# Patient Record
Sex: Female | Born: 1945
Health system: Southern US, Community
[De-identification: ages and names within clinical notes are randomized; demographics above are authoritative.]

## PROBLEM LIST (undated history)

## (undated) DIAGNOSIS — E559 Vitamin D deficiency, unspecified: Secondary | ICD-10-CM

## (undated) DIAGNOSIS — Z923 Personal history of irradiation: Secondary | ICD-10-CM

## (undated) DIAGNOSIS — I10 Essential (primary) hypertension: Secondary | ICD-10-CM

## (undated) DIAGNOSIS — Z801 Family history of malignant neoplasm of trachea, bronchus and lung: Secondary | ICD-10-CM

## (undated) DIAGNOSIS — IMO0001 Reserved for inherently not codable concepts without codable children: Secondary | ICD-10-CM

## (undated) DIAGNOSIS — Z803 Family history of malignant neoplasm of breast: Secondary | ICD-10-CM

## (undated) DIAGNOSIS — C7951 Secondary malignant neoplasm of bone: Secondary | ICD-10-CM

## (undated) DIAGNOSIS — E785 Hyperlipidemia, unspecified: Secondary | ICD-10-CM

## (undated) DIAGNOSIS — IMO0002 Reserved for concepts with insufficient information to code with codable children: Secondary | ICD-10-CM

## (undated) DIAGNOSIS — C3490 Malignant neoplasm of unspecified part of unspecified bronchus or lung: Secondary | ICD-10-CM

## (undated) DIAGNOSIS — T7840XA Allergy, unspecified, initial encounter: Secondary | ICD-10-CM

## (undated) DIAGNOSIS — F419 Anxiety disorder, unspecified: Secondary | ICD-10-CM

## (undated) DIAGNOSIS — K644 Residual hemorrhoidal skin tags: Secondary | ICD-10-CM

## (undated) DIAGNOSIS — Z9221 Personal history of antineoplastic chemotherapy: Secondary | ICD-10-CM

## (undated) HISTORY — PX: APPENDECTOMY: SHX54

## (undated) HISTORY — DX: Malignant neoplasm of unspecified part of unspecified bronchus or lung: C34.90

## (undated) HISTORY — DX: Essential (primary) hypertension: I10

## (undated) HISTORY — DX: Secondary malignant neoplasm of bone: C79.51

## (undated) HISTORY — DX: Family history of malignant neoplasm of breast: Z80.3

## (undated) HISTORY — DX: Family history of malignant neoplasm of trachea, bronchus and lung: Z80.1

## (undated) HISTORY — DX: Reserved for inherently not codable concepts without codable children: IMO0001

## (undated) HISTORY — PX: OVARIAN CYST REMOVAL: SHX89

## (undated) HISTORY — DX: Hyperlipidemia, unspecified: E78.5

## (undated) HISTORY — DX: Personal history of irradiation: Z92.21

## (undated) HISTORY — DX: Personal history of irradiation: Z92.3

## (undated) HISTORY — DX: Reserved for concepts with insufficient information to code with codable children: IMO0002

## (undated) HISTORY — DX: Vitamin D deficiency, unspecified: E55.9

## (undated) HISTORY — DX: Residual hemorrhoidal skin tags: K64.4

---

## 2002-04-15 ENCOUNTER — Ambulatory Visit (HOSPITAL_COMMUNITY): Admission: RE | Admit: 2002-04-15 | Discharge: 2002-04-15 | Payer: Self-pay | Admitting: Gastroenterology

## 2012-06-12 ENCOUNTER — Other Ambulatory Visit (HOSPITAL_COMMUNITY): Payer: Self-pay | Admitting: Internal Medicine

## 2012-06-12 DIAGNOSIS — R918 Other nonspecific abnormal finding of lung field: Secondary | ICD-10-CM

## 2012-06-13 ENCOUNTER — Encounter (HOSPITAL_COMMUNITY): Payer: Self-pay

## 2012-06-13 ENCOUNTER — Encounter (HOSPITAL_COMMUNITY)
Admission: RE | Admit: 2012-06-13 | Discharge: 2012-06-13 | Disposition: A | Discharge status: Home / Self Care | Payer: 59 | Source: Ambulatory Visit | Attending: Internal Medicine | Admitting: Internal Medicine

## 2012-06-13 DIAGNOSIS — C7951 Secondary malignant neoplasm of bone: Secondary | ICD-10-CM | POA: Insufficient documentation

## 2012-06-13 DIAGNOSIS — C781 Secondary malignant neoplasm of mediastinum: Secondary | ICD-10-CM | POA: Insufficient documentation

## 2012-06-13 DIAGNOSIS — R918 Other nonspecific abnormal finding of lung field: Secondary | ICD-10-CM

## 2012-06-13 DIAGNOSIS — R222 Localized swelling, mass and lump, trunk: Secondary | ICD-10-CM | POA: Insufficient documentation

## 2012-06-13 DIAGNOSIS — C50919 Malignant neoplasm of unspecified site of unspecified female breast: Secondary | ICD-10-CM | POA: Insufficient documentation

## 2012-06-13 LAB — GLUCOSE, CAPILLARY: Glucose-Capillary: 94 mg/dL (ref 70–99)

## 2012-06-13 MED ORDER — FLUDEOXYGLUCOSE F - 18 (FDG) INJECTION
19.1000 | Freq: Once | INTRAVENOUS | Status: AC | PRN
  Administered 2012-06-13: 19.1 via INTRAVENOUS

## 2012-06-14 ENCOUNTER — Other Ambulatory Visit: Payer: Self-pay | Admitting: Thoracic Surgery (Cardiothoracic Vascular Surgery)

## 2012-06-14 ENCOUNTER — Institutional Professional Consult (permissible substitution) (INDEPENDENT_AMBULATORY_CARE_PROVIDER_SITE_OTHER): Payer: 59 | Admitting: Thoracic Surgery (Cardiothoracic Vascular Surgery)

## 2012-06-14 ENCOUNTER — Encounter: Payer: Self-pay | Admitting: Thoracic Surgery (Cardiothoracic Vascular Surgery)

## 2012-06-14 VITALS — BP 144/63 | HR 70 | Resp 16 | Ht 62.0 in | Wt 182.0 lb

## 2012-06-14 DIAGNOSIS — R222 Localized swelling, mass and lump, trunk: Secondary | ICD-10-CM

## 2012-06-14 DIAGNOSIS — D381 Neoplasm of uncertain behavior of trachea, bronchus and lung: Secondary | ICD-10-CM

## 2012-06-14 NOTE — Progress Notes (Signed)
PCP is [name], MD Referring Provider is Cloward, Laban Emperor, MD  Chief Complaint  Patient presents with  . Lung Mass    suprclavicular mass per [facility] CT CHEST/PET    HPI: 66 year old Hispanic woman presents with a chief complaint of a questionable left neck mass.  Jacqueline Leonard is a 66 year old woman, nonsmoker, who about a year ago first noted she thought to be a fullness or mass in her left neck and shoulder region. She said this would come and go. It was nontender. She recently saw Dr. Andi Devon and mention this to him. He did a CT of the chest which showed a left lower lobe mass and osteolytic left fourth rib lesion small bilateral lung nodules and a T12 vertebral lesion. There was only fat deposition in the left subclavicular fossa. A PET CT was done on 06/13/2012 which showed the lung, rib and vertebral masses were hypermetabolic. Also demonstrated a small right supraclavicular node that was hypermetabolic as well.  Other than the question of a fullness in her left supraclavicular fossa, and [name] feels well. She denies cough, hemoptysis, fevers, chills, sweats, weight loss, chest or rib pain, and back pain.   Past Medical History  Diagnosis Date  . Lung mass   . Hypertension   . Hyperlipidemia   . Vitamin d deficiency     No past surgical history on file.  No family history on file.  Social History History  Substance Use Topics  . Smoking status: Never Smoker   . Smokeless tobacco: Never Used  . Alcohol Use: No    Current Outpatient Prescriptions  Medication Sig Dispense Refill  . amLODipine (NORVASC) 5 MG tablet Take 5 mg by mouth daily.      . cholecalciferol (VITAMIN D) 1000 UNITS tablet Take 1,000 Units by mouth daily.      . fish oil-omega-3 fatty acids 1000 MG capsule Take 1 g by mouth daily.      . Potassium 95 MG TABS Take by mouth.      . simvastatin (ZOCOR) 20 MG tablet Take 20 mg by mouth every evening.      .  valsartan-hydrochlorothiazide (DIOVAN-HCT) 160-25 MG per tablet Take 1 tablet by mouth daily.        Allergies  Allergen Reactions  . Penicillins     Review of Systems  Constitutional: Negative for fever, chills, fatigue and unexpected weight change.  HENT:       Fullness left side of neck  Respiratory: Negative for cough, shortness of breath, wheezing and stridor.   Cardiovascular: Negative for chest pain and leg swelling.  Genitourinary: Negative.   Musculoskeletal: Negative.   Neurological: Negative.   All other systems reviewed and are negative.    BP 144/63  Pulse 70  Resp 16  Ht 5\' 2"  (1.575 m)  Wt 182 lb (82.555 kg)  BMI 33.29 kg/m2  SpO2 97% Physical Exam  Vitals reviewed. Constitutional: She is oriented to person, place, and time. She appears well-developed and well-nourished. No distress.  HENT:  Head: Normocephalic and atraumatic.  Eyes: EOM are normal. Pupils are equal, round, and reactive to light.  Neck: Neck supple. No thyromegaly present.       No palpable cervical or supraclavicular lung mass  Cardiovascular: Normal rate, regular rhythm, normal heart sounds and intact distal pulses.  Exam reveals no friction rub.   No murmur heard. Pulmonary/Chest: Breath sounds normal. She has no wheezes. She has no rales.  Abdominal: Soft. There is  no tenderness.  Musculoskeletal: She exhibits no edema.  Lymphadenopathy:    She has no cervical adenopathy.  Neurological: She is alert and oriented to person, place, and time. No cranial nerve deficit.  Skin: Skin is warm and dry.     Diagnostic Tests: CT chest at [facility] done 06/10/2012 Large pleural based mass posterior medial left mid thorax Small bilateral lung nodules Expansile osteolytic left mid fourth rib lesion Small osteolytic lesion posterior left T12 vertebrae  PET/CT 06/13/12 IMPRESSION:  1. Intensely hypermetabolic left lower lobe mass consistent with  primary bronchogenic carcinoma.  2.  Mediastinal metastasis as well as contralateral right  supraclavicular nodal metastasis.  3. Hypermetabolic skeletal metastasis include the left lateral  fifth rib and T12 vertebral body.  5. Paraspinal soft tissue metastasis adjacent to the T12 vertebral  body.   Impression: 66 year old nonsmoker with a new lung mass associated with masses in the spine, ribs, and right supraclavicular fossa. These lesions are all hypermetabolic by PET. This is most consistent with a stage IV lung cancer. There is some outside chance this could represent a different etiology, but but I think it is very small. She needs a tissue diagnosis so that appropriate treatment can be planned. I think in her case the best option is to do a CT guided needle biopsy of the left lower lobe mass, as it abuts the pleura posteriorly and should be easy to get a good biopsy under local avoid the need for more invasive procedures.  I discussed these findings and reviewed the films with the patient and her husband. She is very anxious and speaks [place] as a second language. Her brother-in-law listened in on the conversation and discussed some of these issues with her as well. She does understand the findings and the possibility that could represent metastatic cancer. She does understand the need for a biopsy for definitive diagnosis to guide treatment.  Plan: We'll arrange CT-guided needle biopsy of left lower lobe mass  We will follow the patient up in [facility] clinic after the biopsy to discuss the results and plan treatment.

## 2012-06-18 ENCOUNTER — Other Ambulatory Visit: Payer: Self-pay | Admitting: Radiology

## 2012-06-18 ENCOUNTER — Encounter (HOSPITAL_COMMUNITY): Payer: Self-pay | Admitting: Pharmacy Technician

## 2012-06-19 ENCOUNTER — Ambulatory Visit (HOSPITAL_COMMUNITY)
Admission: RE | Admit: 2012-06-19 | Discharge: 2012-06-19 | Disposition: A | Discharge status: Home / Self Care | Payer: 59 | Source: Ambulatory Visit | Attending: Thoracic Surgery (Cardiothoracic Vascular Surgery) | Admitting: Thoracic Surgery (Cardiothoracic Vascular Surgery)

## 2012-06-19 ENCOUNTER — Ambulatory Visit (HOSPITAL_COMMUNITY)
Admission: RE | Admit: 2012-06-19 | Discharge: 2012-06-19 | Disposition: A | Discharge status: Home / Self Care | Payer: 59 | Source: Ambulatory Visit | Attending: Interventional Radiology | Admitting: Interventional Radiology

## 2012-06-19 ENCOUNTER — Encounter (HOSPITAL_COMMUNITY): Payer: Self-pay

## 2012-06-19 ENCOUNTER — Ambulatory Visit (HOSPITAL_COMMUNITY): Admission: RE | Admit: 2012-06-19 | Payer: 59 | Source: Ambulatory Visit

## 2012-06-19 DIAGNOSIS — E785 Hyperlipidemia, unspecified: Secondary | ICD-10-CM | POA: Insufficient documentation

## 2012-06-19 DIAGNOSIS — D381 Neoplasm of uncertain behavior of trachea, bronchus and lung: Secondary | ICD-10-CM

## 2012-06-19 DIAGNOSIS — C3490 Malignant neoplasm of unspecified part of unspecified bronchus or lung: Secondary | ICD-10-CM | POA: Insufficient documentation

## 2012-06-19 DIAGNOSIS — I10 Essential (primary) hypertension: Secondary | ICD-10-CM | POA: Insufficient documentation

## 2012-06-19 DIAGNOSIS — C7951 Secondary malignant neoplasm of bone: Secondary | ICD-10-CM | POA: Insufficient documentation

## 2012-06-19 HISTORY — PX: SOFT TISSUE BIOPSY: SHX1053

## 2012-06-19 HISTORY — DX: Malignant neoplasm of unspecified part of unspecified bronchus or lung: C34.90

## 2012-06-19 LAB — PROTIME-INR
INR: 1.05 (ref 0.00–1.49)
Prothrombin Time: 13.9 seconds (ref 11.6–15.2)

## 2012-06-19 LAB — CBC
MCH: 28.7 pg (ref 26.0–34.0)
MCHC: 33 g/dL (ref 30.0–36.0)
Platelets: 222 10*3/uL (ref 150–400)
RBC: 4.29 MIL/uL (ref 3.87–5.11)

## 2012-06-19 LAB — APTT: aPTT: 28 seconds (ref 24–37)

## 2012-06-19 MED ORDER — SODIUM CHLORIDE 0.9 % IV SOLN: Freq: Once | INTRAVENOUS | Status: DC

## 2012-06-19 MED ORDER — FENTANYL CITRATE 0.05 MG/ML IJ SOLN
INTRAMUSCULAR | Status: AC
  Filled 2012-06-19: qty 4

## 2012-06-19 MED ORDER — MIDAZOLAM HCL 2 MG/2ML IJ SOLN
INTRAMUSCULAR | Status: AC
  Filled 2012-06-19: qty 4

## 2012-06-19 MED ORDER — MIDAZOLAM HCL 5 MG/5ML IJ SOLN
INTRAMUSCULAR | Status: AC | PRN
  Administered 2012-06-19: 1 mg via INTRAVENOUS

## 2012-06-19 MED ORDER — FENTANYL CITRATE 0.05 MG/ML IJ SOLN
INTRAMUSCULAR | Status: AC | PRN
  Administered 2012-06-19: 50 ug via INTRAVENOUS

## 2012-06-19 NOTE — H&P (Signed)
Chief Complaint: "I'm here for lung biopsy" Referring Physician:Hendrickson HPI: Jacqueline Leonard is an 66 y.o. female who is found to have a new left lung mass. She had a PET scan showing hypermetabolic activity and Dr. Dorris Fetch has referred her for a CT guided biopsy of the left lower lung mass. She otherwise feels well. No new c/o or illnesses.  Past Medical History:  Past Medical History  Diagnosis Date  . Lung mass   . Hypertension   . Hyperlipidemia   . Vitamin d deficiency     Past Surgical History: No past surgical history on file.  Family History: No family history on file.  Social History:  reports that she has never smoked. She has never used smokeless tobacco. She reports that she does not drink alcohol. Her drug history not on file.  Allergies:  Allergies  Allergen Reactions  . Penicillins     Medications: amLODipine (NORVASC) 5 MG tablet (Taking) Sig - Route: Take 5 mg by mouth daily. - Oral Class: Historical Med Number of times this order has been changed since signing: 1 [address] cholecalciferol (VITAMIN D) 1000 UNITS tablet (Taking) Sig - Route: Take 1,000 Units by mouth daily. - Oral Class: Historical Med Number of times this order has been changed since signing: 1 Order [address] fish oil-omega-3 fatty acids 1000 MG capsule (Taking) Sig - Route: Take 1 g by mouth daily. - Oral Class: Historical Med Number of times this order has been changed since signing: 1 Order [address] simvastatin (ZOCOR) 20 MG tablet (Taking) Sig - Route: Take 20 mg by mouth every evening. - Oral Class: Historical Med Number of times this order has been changed since signing: 1 Order [address] valsartan-hydrochlorothiazide (DIOVAN-HCT) 160-25 MG per tablet (Taking) Sig - Route: Take 1 tablet by mouth daily. - Oral Class: Historical Med   Please HPI for pertinent positives, otherwise complete 10 system ROS negative.  Physical Exam: Blood pressure 145/64, pulse 58, temperature  96.9 F (36.1 C), temperature source Oral, resp. rate 18, height 5\' 2"  (1.575 m), weight 182 lb (82.555 kg), SpO2 99.00%. Body mass index is 33.29 kg/(m^2).   General Appearance:  Alert, cooperative, no distress, appears stated age, obese.  Head:  Normocephalic, without obvious abnormality, atraumatic  ENT: Unremarkable  Neck: Supple, symmetrical, trachea midline, no adenopathy, thyroid: not enlarged, symmetric, no tenderness/mass/nodules  Lungs:   Clear to auscultation bilaterally, no w/r/r, respirations unlabored without use of accessory muscles.  Chest Wall:  No tenderness or deformity.  Heart:  Regular rate and rhythm, S1, S2 normal, no murmur, rub or gallop. Carotids 2+ without bruit.  Abdomen:   Soft, non-tender, non distended. Bowel sounds active all four quadrants,  no masses, no organomegaly.  Extremities: Extremities normal, atraumatic, no cyanosis or edema  Neurologic: Normal affect, no gross deficits.   Results for orders placed during the hospital encounter of 06/19/12 (from the past 48 hour(s))  APTT     Status: Normal   Collection Time   06/19/12  7:17 AM      Component Value Range Comment   aPTT 28  24 - 37 seconds   CBC     Status: Normal   Collection Time   06/19/12  7:17 AM      Component Value Range Comment   WBC 5.8  4.0 - 10.5 K/uL    RBC 4.29  3.87 - 5.11 MIL/uL    Hemoglobin 12.3  12.0 - 15.0 g/dL    HCT 37.3  36.0 - 46.0 %    MCV 86.9  78.0 - 100.0 fL    MCH 28.7  26.0 - 34.0 pg    MCHC 33.0  30.0 - 36.0 g/dL    RDW 09.8  11.9 - 14.7 %    Platelets 222  150 - 400 K/uL   PROTIME-INR     Status: Normal   Collection Time   06/19/12  7:17 AM      Component Value Range Comment   Prothrombin Time 13.9  11.6 - 15.2 seconds    INR 1.05  0.00 - 1.49    No results found.  Assessment/Plan Left lower lung mass concerning for malignancy For CT guided biopsy today. Discussed procedure in detail as well as risks and complications including bleeding, infection,  PTX, hemothorax, possibly requiring chest tube placement and possible need for admission. Also discussed risks of sedation including cardiac and respiratory issues and N/V. She understands all involved and signs consent. Labs all ok.  Brayton El PA-C 06/19/2012, 8:39 AM

## 2012-06-19 NOTE — Procedures (Signed)
Technically successful CT guided biopsy of hypermetabolic L 5th rib mass. No immediate complications. Awaiting pathology report.

## 2012-06-27 ENCOUNTER — Ambulatory Visit
Admission: RE | Admit: 2012-06-27 | Discharge: 2012-06-27 | Disposition: A | Discharge status: Home / Self Care | Payer: 59 | Source: Ambulatory Visit | Attending: Radiation Oncology | Admitting: Radiation Oncology

## 2012-06-27 ENCOUNTER — Ambulatory Visit (HOSPITAL_BASED_OUTPATIENT_CLINIC_OR_DEPARTMENT_OTHER): Payer: 59 | Admitting: Internal Medicine

## 2012-06-27 ENCOUNTER — Encounter: Payer: Self-pay | Admitting: Radiation Oncology

## 2012-06-27 ENCOUNTER — Encounter: Payer: Self-pay | Admitting: Internal Medicine

## 2012-06-27 ENCOUNTER — Other Ambulatory Visit (HOSPITAL_COMMUNITY)
Admission: RE | Admit: 2012-06-27 | Discharge: 2012-06-27 | Disposition: A | Discharge status: Home / Self Care | Payer: 59 | Source: Ambulatory Visit | Attending: Internal Medicine | Admitting: Internal Medicine

## 2012-06-27 ENCOUNTER — Encounter: Payer: Self-pay | Admitting: *Deleted

## 2012-06-27 VITALS — BP 134/57 | HR 74 | Temp 97.4°F | Resp 18 | Ht 62.0 in | Wt 182.0 lb

## 2012-06-27 DIAGNOSIS — C3490 Malignant neoplasm of unspecified part of unspecified bronchus or lung: Secondary | ICD-10-CM | POA: Insufficient documentation

## 2012-06-27 DIAGNOSIS — E785 Hyperlipidemia, unspecified: Secondary | ICD-10-CM | POA: Insufficient documentation

## 2012-06-27 DIAGNOSIS — C799 Secondary malignant neoplasm of unspecified site: Secondary | ICD-10-CM | POA: Insufficient documentation

## 2012-06-27 DIAGNOSIS — C7952 Secondary malignant neoplasm of bone marrow: Secondary | ICD-10-CM

## 2012-06-27 DIAGNOSIS — C3430 Malignant neoplasm of lower lobe, unspecified bronchus or lung: Secondary | ICD-10-CM

## 2012-06-27 DIAGNOSIS — I10 Essential (primary) hypertension: Secondary | ICD-10-CM | POA: Insufficient documentation

## 2012-06-27 DIAGNOSIS — C7951 Secondary malignant neoplasm of bone: Secondary | ICD-10-CM

## 2012-06-27 DIAGNOSIS — E559 Vitamin D deficiency, unspecified: Secondary | ICD-10-CM | POA: Insufficient documentation

## 2012-06-27 NOTE — Progress Notes (Signed)
[facility]  Clinical Social Work  Clinical Social Work met with patient, patient's spouse, Systems developer, and patient navigator at [facility] today for new patient consultation. MD discussed patient's diagnosis and possible treatment plan with patient and spouse.  Patient speaks limited English and agreed that it would be most appropriate to arrange interpreter for future appointments.  CSW contacted the interpreting office to arrange for future appointments.  Patient verbalized understanding regarding discussion today and asked MD, "am I going to live?"  CSW asked patient and patient's spouse, "what is your greatest concern at this time?"  Patient became tearful and had difficulty explaining concerns/fears.  Patient receptive to meeting with CSW at [facility] to further explore her concerns, fears, and needs.  CSW will follow up with patient at [facility].  Kathrin Penner, MSW, [facility] Clinical Social Worker [facility] [phone]

## 2012-06-28 ENCOUNTER — Telehealth: Payer: Self-pay | Admitting: *Deleted

## 2012-06-28 NOTE — Telephone Encounter (Signed)
Pt husband requesting appt times.  I stated orders have been made and schedulers will work on appt soon. He verbalized understanding

## 2012-06-29 DIAGNOSIS — C7951 Secondary malignant neoplasm of bone: Secondary | ICD-10-CM | POA: Insufficient documentation

## 2012-06-29 NOTE — Progress Notes (Signed)
Radiation Oncology         [phone] ________________________________  Name: Jacqueline Leonard MRN: [id number]  Date: 06/27/2012  DOB: 25-Jun-1946  WU:[id number] Elbert Ewings, MD  Lorra Hals, MD  REFERRING PHYSICIAN: Loreli Slot, *   DIAGNOSIS: Metastatic non-small cell lung cancer   HISTORY OF PRESENT ILLNESS::Jacqueline Leonard is a 66 y.o. female who is seen for an initial consultation visit. Patient is seen in multidisciplinary thoracic clinic. The patient notes that she had a questionable left mass in the left neck and left shoulder region. She felt that this had been present for approximately 1 year and that it would come and go. She discussed this with her physician and a CT scan of the chest was ordered. This showed a left lower lobe mass and osteolytic left rib lesion as well with small bilateral lung nodules. There is also a possible T12 vertebral lesion. No significant findings present in the left supraclavicular region. The patient proceeded with a PET scan on 06/13/2012. This showed an intensely hypermetabolic left lower lobe mass consistent with a primary bronchogenic carcinoma. There was mediastinal metastasis as well as contralateral right supraclavicular nodal metastasis. There were also hypermetabolic skeletal metastases including the left lateral fifth rib and T12 vertebral body.  The patient proceeded to undergo a CT guided biopsy of the left fifth rib. This returned positive for adenocarcinoma and therefore the patient has been diagnosed with metastatic non-small cell lung cancer. Her case was discussed at thoracic conference this morning and I have been asked to see the patient for consideration of palliative radiation treatment.   PREVIOUS RADIATION THERAPY: No   PAST MEDICAL HISTORY:  has a past medical history of Lung mass; Hypertension; Hyperlipidemia; and Vitamin d deficiency.     PAST SURGICAL HISTORY:No past surgical history on  file.   FAMILY HISTORY: family history is not on file.   SOCIAL HISTORY:  reports that she has never smoked. She has never used smokeless tobacco. She reports that she does not drink alcohol.   ALLERGIES: Penicillins   MEDICATIONS:  Current Outpatient Prescriptions  Medication Sig Dispense Refill  . amLODipine (NORVASC) 5 MG tablet Take 5 mg by mouth daily.      . cholecalciferol (VITAMIN D) 1000 UNITS tablet Take 1,000 Units by mouth daily.      . fish oil-omega-3 fatty acids 1000 MG capsule Take 1 g by mouth daily.      . Potassium 99 MG TABS Take 1 tablet by mouth daily.      . simvastatin (ZOCOR) 20 MG tablet Take 20 mg by mouth every evening.      . valsartan-hydrochlorothiazide (DIOVAN-HCT) 160-25 MG per tablet Take 1 tablet by mouth daily.         REVIEW OF SYSTEMS:  A 15 point review of systems is documented in the electronic medical record. This was obtained by the nursing staff. However, I reviewed this with the patient to discuss relevant findings and make appropriate changes.  Pertinent items are noted in HPI.  The patient and he states that she has had some occasional left-sided chest pain.    PHYSICAL EXAM:  height is 5\' 2"  (1.575 m) and weight is 182 lb (82.555 kg). Her oral temperature is 97.4 F (36.3 C). Her blood pressure is 134/57 and her pulse is 74. Her respiration is 18 and oxygen saturation is 97%.   General: Well-developed, in no acute distress HEENT: Normocephalic, atraumatic; oral cavity clear Neck: Supple  without any lymphadenopathy Cardiovascular: Regular rate and rhythm Respiratory: Clear to auscultation bilaterally GI: Soft, nontender, normal bowel sounds Extremities: No edema present Neuro: No focal deficits     LABORATORY DATA:  Lab Results  Component Value Date   WBC 5.8 06/19/2012   HGB 12.3 06/19/2012   HCT 37.3 06/19/2012   MCV 86.9 06/19/2012   PLT 222 06/19/2012   No results found for this basename: NA, K, CL, CO2   No results  found for this basename: ALT, AST, GGT, ALKPHOS, BILITOT      RADIOGRAPHY: Dg Chest 1 View  06/19/2012  *RADIOLOGY REPORT*  Clinical Data: Post biopsy in lateral left chest wall lesion question pneumothorax  CHEST - 1 VIEW  Comparison: None  Findings: Enlargement of cardiac silhouette. Mediastinal contours and pulmonary vascularity normal. Minimal peribronchial thickening. No acute infiltrate, pleural effusion, or pneumothorax. Destructive rib lesion identified at lateral aspect of the left fourth rib with associated extrapleural density. No additional osseous abnormalities seen.  IMPRESSION: No pneumothorax. Destructive left lateral fourth rib lesion. Minimal enlargement of cardiac silhouette. Minimal bronchitic changes.  Original Report Authenticated By: Lollie Marrow, M.D.   Nm Pet Image Initial (pi) Skull Base To Thigh  06/13/2012  *RADIOLOGY REPORT*  Clinical Data: Initial treatment strategy for lung mass.  NUCLEAR MEDICINE PET SKULL BASE TO THIGH  Fasting Blood Glucose:  94  Technique:  19.1 mCi F-18 FDG was injected intravenously. CT data was obtained and used for attenuation correction and anatomic localization only.  (This was not acquired as a diagnostic CT examination.) Additional exam technical data entered on technologist worksheet.  Comparison:  None  Findings:  Neck:   Small hypermetabolic right supraclavicular node measures 10 mm (image 50) with SUV max = 7.2.  Adjacent 8 mm nodal metastasis on image 48.  Chest:  Within the medial aspect of the left lower lobe there is an intensely hypermetabolic mass measuring 3.2 x 4.3 cm (image 8) with SUV max = 25.3.  Metastatic nodal disease to the subcarinal location with an intense hypermetabolic subcarinal node measuring 13 mm short axis and SUV max = 9.3.  Hypermetabolic right lower paratracheal lymph node measuring 7 mm short axis with SUV max = 6.4).  Hypermetabolic chest wall metastasis expanding the left lateral fifth rib (image 69) with SUV max  = 24.4).  Abdomen/Pelvis:  No abnormal hypermetabolic activity within the liver, pancreas, adrenal glands, or spleen.  No hypermetabolic lymph nodes in the abdomen or pelvis.  The fibroid uterus with central calcification noted.  [place]:  In addition to the left chest wall metastasis involving the fifth rib, there is a skeletal metastasis to the T12 vertebral body with SUV max = 13.9.  Additionally, at the T12 level there are paraspinal soft tissue metastasis whichare  difficult to define on the CT but are hypermetabolic (image 117 and 122).  There is subtle soft tissue thickening at this level.  There is anterolisthesis of L5 on S1 related to bilateral pars defects.  IMPRESSION:  1.  Intensely  hypermetabolic left lower lobe mass consistent with primary bronchogenic carcinoma. 2.  Mediastinal metastasis as well as contralateral right supraclavicular nodal metastasis. 3.  Hypermetabolic skeletal metastasis include the left lateral fifth rib and T12 vertebral body.  5.  Paraspinal soft tissue metastasis adjacent to the T12 vertebral body.  Original Report Authenticated By: Genevive Bi, M.D.   Ct Biopsy  06/19/2012  *RADIOLOGY REPORT*  Indication: Presumed metastatic lung cancer with hypermetabolic left 4th rib mass  CT GUIDED FINE NEEDLE ASPIRATION AND CORE NEEDLE BIOPSY OF LEFT 4th RIB MASS  Comparisons: PET CT - 06/13/2012  Medications: Fentanyl 50 mcg IV; Versed 1 mg IV  Contrast: None  Sedation time: 33 minutes  Complications: None immediate  TECHNIQUE/FINDINGS:  Informed consent was obtained from the patient following an explanation of the procedure, risks, benefits and alternatives.  A time out was performed prior to the initiation of the procedure.  The patient was positioned supine on the CT table and a limited CT was performed for procedural planning demonstrating the expansile lesion measuring approximately 1.8 x 2.9 cm involving the lateral aspect of the left fourth rib.  The procedure was planned.   The operative site was prepped and draped in the usual sterile fashion. Appropriate trajectory was confirmed with a 22 gauge spinal needle after the adjacent tissues were anesthetized with 1% Lidocaine with epinephrine.   Under intermittent CT guidance, a 17 gauge coaxial needle was advanced into the peripheral aspect of the mass. Positioning was confirmed and 2 FNA samples were obtained with the use of a 20 gauge Francine needle.  Quick stain pathologic review confirmed the acquisition of lesional tissue.  Appropriate positioning was again confirmed and 3 additional samples were obtained with an 18 gauge core biopsy device.  The co-axial needle was removed and hemostasis was achieved with manual compression.  A limited post procedural CT was negative for hemorrhage or additional complication.  A dressing was placed.  The patient tolerated the procedure well without immediate postprocedural complication.  IMPRESSION:  Technically successful CT guided fine needle aspiration and core needle biopsy of expansile lesion involving the lateral aspect of the left fourth rib.  Original Report Authenticated By: Waynard Reeds, M.D.       IMPRESSION: Pleasant 66 year old female with metastatic lung cancer, non-small cell.   PLAN: The patient I believe is an appropriate candidate to the left fifth rib metastasis which is in the lateral chest. This is a destructive lesion at this location and the patient to me described at least some history of pain in this area although she appears to deny any current significant pain. However, I believe that she is at risk of further pain in this area without palliative radiation to this site.  I therefore discussed a possible short course of radiation to this area and I believe that a 2 week course would be fine for her given this location. I would not target anything centrally at this time and I believe that this therefore would be very well tolerated without any significant risk  of esophagitis. She does have other areas including a bony metastasis which is at the T12 level. However she is not having any pain in this area and she is also seeing Dr. Shirline Frees for consideration of palliative chemotherapy. My post therefore would be to target the dominant area of concern I believe at this time with a short course of radiation and then additional palliative radiation could be given to other areas if needed in the future. Such a treatment I believe would be well tolerated with concurrent chemotherapy.  I therefore discussed a possible two-week course of treatment with the patient and we did discuss the possible side effects and risks of treatment. All of her questions were answered and she indicates that she does wish to proceed with this treatment. Therefore the patient will be scheduled for a simulation at the soonest appropriate available time and we will proceed with treatment planning.  I spent 60 minutes minutes face to face with the patient and more than 50% of that time was spent in counseling and/or coordination of care.    ________________________________   Radene Gunning, MD, PhD

## 2012-06-29 NOTE — Progress Notes (Signed)
[facility] [phone]   [phone]  CONSULT NOTE  REASON FOR CONSULTATION:   66 years old Hispanic female diagnosed with lung cancer.  HPI Jacqueline Leonard is a 66 y.o. female never smoker with past medical history significant for hypertension, dyslipidemia and vitamin D deficiency. For several months the patient has been complaining of fullness in the left supraclavicular area. She was seen by her primary care physician Dr. Andi Devon and CT scan of the chest was performed at that imaging which showed left lower lobe mass with osteolytic left fourth rib lesion and a small bilateral lung nodules as well as T12 vertebral lesion. There was only fat deposition in the left supra-clavicular fossa. The patient then had a PET scan on 06/13/2012 and it showed Intensely hypermetabolic left lower lobe mass consistent with primary bronchogenic carcinoma. Mediastinal metastasis as well as contralateral right supraclavicular nodal metastasis. Hypermetabolic skeletal metastasis include the left lateral fifth rib and T12 vertebral body. Paraspinal soft tissue metastasis adjacent to the T12 vertebral body. The patient was referred to Dr. Dorris Fetch and he ordered CT guided fine needle aspiration and core biopsy of the left fourth rib mass. This was performed by interventional radiology on 06/19/2012 and the final pathology showed metastatic adenocarcinoma. The adenocarcinoma demonstrates the following immunophenotype: Cytokeratin 7 - strong diffuse expression.TTF-1 - strong diffuse expression.CK 5/6 - negative expression.Given the known lung mass, the morphology and immunophenotype are diagnostic of primary pulmonary adenocarcinoma. The tissue blocks were also sent for EGFR mutation as well as ALK gene translocation. The results are pending. Dr. Dorris Fetch kindly referred the patient to me today for further evaluation and recommendation regarding treatment of her condition. When seen  today she continues to complain of pain in the left rib cage close to the back. She denied having any significant shortness breath, cough or hemoptysis. The patient denied having any significant weight loss or night sweats. She denied having any significant headache or visual changes. Family history significant for COPD in her mother and father, her brother had lung cancer and she had 2 sister with breast cancer.  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 The patient has no history of smoking, she drinks alcohol occasionally and no history of drug abuse.  @SFHPI @  Past Medical History  Diagnosis Date  . Lung mass   . Hypertension   . Hyperlipidemia   . Vitamin d deficiency     No past surgical history on file.  No family history on file.  Social History History  Substance Use Topics  . Smoking status: Never Smoker   . Smokeless tobacco: Never Used  . Alcohol Use: No    Allergies  Allergen Reactions  . Penicillins     Current Outpatient Prescriptions  Medication Sig Dispense Refill  . amLODipine (NORVASC) 5 MG tablet Take 5 mg by mouth daily.      . cholecalciferol (VITAMIN D) 1000 UNITS tablet Take 1,000 Units by mouth daily.      . fish oil-omega-3 fatty acids 1000 MG capsule Take 1 g by mouth daily.      . Potassium 99 MG TABS Take 1 tablet by mouth daily.      . simvastatin (ZOCOR) 20 MG tablet Take 20 mg by mouth every evening.      . valsartan-hydrochlorothiazide (DIOVAN-HCT) 160-25 MG per tablet Take 1 tablet by mouth daily.        Review  of Systems  A comprehensive review of systems was negative except for: Musculoskeletal: positive for Pain at the posterior lower rib cage.  Physical Exam  ZOX:WRUEA, healthy, no distress, well nourished and well developed SKIN: skin color, texture, turgor are normal HEAD: Normocephalic, No masses, lesions, tenderness or abnormalities EYES: normal, PERRLA EARS:  External ears normal OROPHARYNX:no exudate and no erythema  NECK: supple, no adenopathy LYMPH:  no palpable lymphadenopathy, no hepatosplenomegaly BREAST:not examined LUNGS: clear to auscultation , and palpation HEART: regular rate & rhythm, no murmurs and no gallops ABDOMEN:abdomen soft, non-tender, normal bowel sounds and no masses or organomegaly BACK: Tenderness at the mid thoracic spine to palpation. EXTREMITIES:no joint deformities, effusion, or inflammation, no edema, no skin discoloration, no clubbing, no cyanosis  NEURO: alert & oriented x 3 with fluent speech, no focal motor/sensory deficits  PERFORMANCE STATUS: ECOG 1  LABORATORY DATA: Lab Results  Component Value Date   WBC 5.8 06/19/2012   HGB 12.3 06/19/2012   HCT 37.3 06/19/2012   MCV 86.9 06/19/2012   PLT 222 06/19/2012      Chemistry   No results found for this basename: NA, K, CL, CO2, BUN, CREATININE, GLU   No results found for this basename: CALCIUM, ALKPHOS, AST, ALT, BILITOT       RADIOGRAPHIC STUDIES: Dg Chest 1 View  06/19/2012  *RADIOLOGY REPORT*  Clinical Data: Post biopsy in lateral left chest wall lesion question pneumothorax  CHEST - 1 VIEW  Comparison: None  Findings: Enlargement of cardiac silhouette. Mediastinal contours and pulmonary vascularity normal. Minimal peribronchial thickening. No acute infiltrate, pleural effusion, or pneumothorax. Destructive rib lesion identified at lateral aspect of the left fourth rib with associated extrapleural density. No additional osseous abnormalities seen.  IMPRESSION: No pneumothorax. Destructive left lateral fourth rib lesion. Minimal enlargement of cardiac silhouette. Minimal bronchitic changes.  Original Report Authenticated By: Lollie Marrow, M.D.   Nm Pet Image Initial (pi) Skull Base To Thigh  06/13/2012  *RADIOLOGY REPORT*  Clinical Data: Initial treatment strategy for lung mass.  NUCLEAR MEDICINE PET SKULL BASE TO THIGH  Fasting Blood Glucose:  94   Technique:  19.1 mCi F-18 FDG was injected intravenously. CT data was obtained and used for attenuation correction and anatomic localization only.  (This was not acquired as a diagnostic CT examination.) Additional exam technical data entered on technologist worksheet.  Comparison:  None  Findings:  Neck:   Small hypermetabolic right supraclavicular node measures 10 mm (image 50) with SUV max = 7.2.  Adjacent 8 mm nodal metastasis on image 48.  Chest:  Within the medial aspect of the left lower lobe there is an intensely hypermetabolic mass measuring 3.2 x 4.3 cm (image 8) with SUV max = 25.3.  Metastatic nodal disease to the subcarinal location with an intense hypermetabolic subcarinal node measuring 13 mm short axis and SUV max = 9.3.  Hypermetabolic right lower paratracheal lymph node measuring 7 mm short axis with SUV max = 6.4).  Hypermetabolic chest wall metastasis expanding the left lateral fifth rib (image 69) with SUV max = 24.4).  Abdomen/Pelvis:  No abnormal hypermetabolic activity within the liver, pancreas, adrenal glands, or spleen.  No hypermetabolic lymph nodes in the abdomen or pelvis.  The fibroid uterus with central calcification noted.  [place]:  In addition to the left chest wall metastasis involving the fifth rib, there is a skeletal metastasis to the T12 vertebral body with SUV max = 13.9.  Additionally, at the T12 level there are paraspinal  soft tissue metastasis whichare  difficult to define on the CT but are hypermetabolic (image 117 and 122).  There is subtle soft tissue thickening at this level.  There is anterolisthesis of L5 on S1 related to bilateral pars defects.  IMPRESSION:  1.  Intensely  hypermetabolic left lower lobe mass consistent with primary bronchogenic carcinoma. 2.  Mediastinal metastasis as well as contralateral right supraclavicular nodal metastasis. 3.  Hypermetabolic skeletal metastasis include the left lateral fifth rib and T12 vertebral body.  5.  Paraspinal soft  tissue metastasis adjacent to the T12 vertebral body.  Original Report Authenticated By: Genevive Bi, M.D.   Ct Biopsy  06/19/2012  *RADIOLOGY REPORT*  Indication: Presumed metastatic lung cancer with hypermetabolic left 4th rib mass  CT GUIDED FINE NEEDLE ASPIRATION AND CORE NEEDLE BIOPSY OF LEFT 4th RIB MASS  Comparisons: PET CT - 06/13/2012  Medications: Fentanyl 50 mcg IV; Versed 1 mg IV  Contrast: None  Sedation time: 33 minutes  Complications: None immediate  TECHNIQUE/FINDINGS:  Informed consent was obtained from the patient following an explanation of the procedure, risks, benefits and alternatives.  A time out was performed prior to the initiation of the procedure.  The patient was positioned supine on the CT table and a limited CT was performed for procedural planning demonstrating the expansile lesion measuring approximately 1.8 x 2.9 cm involving the lateral aspect of the left fourth rib.  The procedure was planned.  The operative site was prepped and draped in the usual sterile fashion. Appropriate trajectory was confirmed with a 22 gauge spinal needle after the adjacent tissues were anesthetized with 1% Lidocaine with epinephrine.   Under intermittent CT guidance, a 17 gauge coaxial needle was advanced into the peripheral aspect of the mass. Positioning was confirmed and 2 FNA samples were obtained with the use of a 20 gauge Francine needle.  Quick stain pathologic review confirmed the acquisition of lesional tissue.  Appropriate positioning was again confirmed and 3 additional samples were obtained with an 18 gauge core biopsy device.  The co-axial needle was removed and hemostasis was achieved with manual compression.  A limited post procedural CT was negative for hemorrhage or additional complication.  A dressing was placed.  The patient tolerated the procedure well without immediate postprocedural complication.  IMPRESSION:  Technically successful CT guided fine needle aspiration and core  needle biopsy of expansile lesion involving the lateral aspect of the left fourth rib.  Original Report Authenticated By: Waynard Reeds, M.D.    ASSESSMENT: This is a very pleasant 66 years old nonsmoker Hispanic female recently diagnosed with metastatic non-small cell lung cancer, adenocarcinoma with bone metastasis.  PLAN: I have a lengthy discussion with the patient and her husband today about her disease stage, prognosis and treatment options. The patient is a nonsmoker with a family history of lung cancer. She is likely to have molecular mutation in her tumor cells. I recommended for the patient the following: #1 I would wait the results of the EGFR mutation as well as the ALK gene translocation. #2 the patient was seen by Dr. Mitzi Hansen for consideration of palliative radiotherapy to the painful lesion in the lower rib cage and spine. #3 I will complete the staging workup I ordered an MRI of the brain to rule out any brain metastasis. #4 if the patient has no evidence for molecular mutation in her tumor, I would consider him for systemic chemotherapy with either carboplatin and Alimta or carboplatin, paclitaxel and Avastin. This would  be discussed with her in more details in the upcoming visit in 2 weeks after completion of her radiotherapy. #5 I would consider the patient was treatment with biphosphonate like Zometa or Xgeva for his bone metastasis. She would need a dental clearance before starting this treatment. I gave the patient and her husband the time to ask questions and I answered them completely to their satisfaction. The patient agreed to proceed with the current plan. She would come back for followup visit in 2 weeks.  All questions were answered. The patient knows to call the clinic with any problems, questions or concerns. We can certainly see the patient much sooner if necessary.  Thank you so much for allowing me to participate in the care of Jacqueline Leonard. I will continue to  follow up the patient with you and assist in her care.  I spent 30 minutes counseling the patient face to face. The total time spent in the appointment was 60 minutes.  Alecxander Mainwaring K. 06/29/2012, 12:25 PM

## 2012-07-01 ENCOUNTER — Telehealth: Payer: Self-pay | Admitting: Internal Medicine

## 2012-07-01 ENCOUNTER — Encounter: Payer: Self-pay | Admitting: *Deleted

## 2012-07-01 NOTE — Telephone Encounter (Signed)
aware of 8/8 and  8/15 appts

## 2012-07-03 ENCOUNTER — Encounter: Payer: Self-pay | Admitting: Radiation Oncology

## 2012-07-04 ENCOUNTER — Ambulatory Visit
Admission: RE | Admit: 2012-07-04 | Discharge: 2012-07-04 | Disposition: A | Discharge status: Home / Self Care | Payer: 59 | Source: Ambulatory Visit | Attending: Radiation Oncology | Admitting: Radiation Oncology

## 2012-07-04 ENCOUNTER — Ambulatory Visit (HOSPITAL_COMMUNITY)
Admission: RE | Admit: 2012-07-04 | Discharge: 2012-07-04 | Disposition: A | Discharge status: Home / Self Care | Payer: 59 | Source: Ambulatory Visit | Attending: Internal Medicine | Admitting: Internal Medicine

## 2012-07-04 ENCOUNTER — Other Ambulatory Visit (HOSPITAL_BASED_OUTPATIENT_CLINIC_OR_DEPARTMENT_OTHER): Payer: 59

## 2012-07-04 VITALS — BP 156/61 | HR 58 | Temp 97.1°F

## 2012-07-04 DIAGNOSIS — C3490 Malignant neoplasm of unspecified part of unspecified bronchus or lung: Secondary | ICD-10-CM | POA: Insufficient documentation

## 2012-07-04 DIAGNOSIS — C7951 Secondary malignant neoplasm of bone: Secondary | ICD-10-CM

## 2012-07-04 DIAGNOSIS — IMO0002 Reserved for concepts with insufficient information to code with codable children: Secondary | ICD-10-CM | POA: Insufficient documentation

## 2012-07-04 DIAGNOSIS — C3430 Malignant neoplasm of lower lobe, unspecified bronchus or lung: Secondary | ICD-10-CM

## 2012-07-04 DIAGNOSIS — I6789 Other cerebrovascular disease: Secondary | ICD-10-CM | POA: Insufficient documentation

## 2012-07-04 DIAGNOSIS — C799 Secondary malignant neoplasm of unspecified site: Secondary | ICD-10-CM

## 2012-07-04 DIAGNOSIS — Z510 Encounter for antineoplastic radiation therapy: Secondary | ICD-10-CM | POA: Insufficient documentation

## 2012-07-04 DIAGNOSIS — M852 Hyperostosis of skull: Secondary | ICD-10-CM | POA: Insufficient documentation

## 2012-07-04 LAB — COMPREHENSIVE METABOLIC PANEL
ALT: 21 U/L (ref 0–35)
AST: 17 U/L (ref 0–37)
Albumin: 4 g/dL (ref 3.5–5.2)
Alkaline Phosphatase: 75 U/L (ref 39–117)
Potassium: 3.8 mEq/L (ref 3.5–5.3)
Sodium: 139 mEq/L (ref 135–145)
Total Protein: 7 g/dL (ref 6.0–8.3)

## 2012-07-04 LAB — CBC WITH DIFFERENTIAL/PLATELET
EOS%: 1.2 % (ref 0.0–7.0)
MCH: 28.6 pg (ref 25.1–34.0)
MCV: 85.3 fL (ref 79.5–101.0)
MONO%: 8.1 % (ref 0.0–14.0)
NEUT#: 3.4 10*3/uL (ref 1.5–6.5)
RBC: 4.23 10*6/uL (ref 3.70–5.45)
RDW: 13.1 % (ref 11.2–14.5)

## 2012-07-04 MED ORDER — GADOBENATE DIMEGLUMINE 529 MG/ML IV SOLN
17.0000 mL | Freq: Once | INTRAVENOUS | Status: AC | PRN
  Administered 2012-07-04: 17 mL via INTRAVENOUS

## 2012-07-04 NOTE — Progress Notes (Addendum)
  Radiation Oncology         [phone] ________________________________  Name: Jacqueline Leonard MRN: [id number]  Date: 07/04/2012  DOB: [birth year]  SIMULATION AND TREATMENT PLANNING NOTE  DIAGNOSIS:  Metastatic lung cancer  NARRATIVE:  The patient was brought to the CT Simulation planning suite.  Identity was confirmed.  All relevant records and images related to the planned course of therapy were reviewed.   Written consent to proceed with treatment was confirmed which was freely given after reviewing the details related to the planned course of therapy had been reviewed with the patient.  Then, the patient was set-up in a stable reproducible  supine position for radiation therapy.  CT images were obtained.  Surface markings were placed.    The CT images were loaded into the planning software.  Then the target and avoidance structures were contoured.  Treatment planning then occurred.  The radiation prescription was entered and confirmed.  A total of 2 complex treatment devices were fabricated which relate to the designed radiation treatment fields. Each of these customized fields/ complex treatment devices will be used on a daily basis during the radiation course. I have requested : Isodose Plan.   PLAN:  The patient will receive 30 Gy in 10 fractions.  ________________________________   Radene Gunning, MD, PhD    Addendum  Special treatment procedure The patient will receive chemotherapy during the course of radiation treatment. The patient may experience increased or overlapping toxicity due to this combined-modality approach and the patient will be monitored for such problems. This may include extra lab work as necessary. This therefore constitutes a special treatment procedure.

## 2012-07-04 NOTE — Progress Notes (Signed)
MTOC patient here for ct simulation.patient understands and speaks english.Aware to go to [facility] for mri of brain scheduled for 3 pm today.

## 2012-07-05 LAB — POCT I-STAT, CHEM 8
Calcium, Ion: 1.09 mmol/L — ABNORMAL LOW (ref 1.13–1.30)
Glucose, Bld: 94 mg/dL (ref 70–99)
HCT: 41 % (ref 36.0–46.0)
Hemoglobin: 13.9 g/dL (ref 12.0–15.0)

## 2012-07-10 ENCOUNTER — Ambulatory Visit
Admission: RE | Admit: 2012-07-10 | Discharge: 2012-07-10 | Disposition: A | Discharge status: Home / Self Care | Payer: 59 | Source: Ambulatory Visit | Attending: Radiation Oncology | Admitting: Radiation Oncology

## 2012-07-10 ENCOUNTER — Encounter: Payer: Self-pay | Admitting: Radiation Oncology

## 2012-07-10 ENCOUNTER — Telehealth: Payer: Self-pay | Admitting: *Deleted

## 2012-07-10 ENCOUNTER — Encounter: Payer: Self-pay | Admitting: *Deleted

## 2012-07-10 NOTE — Telephone Encounter (Signed)
EGFR Mutation detected per pathology from soft tissue biopsy.  EGFR and surgical pathology given to Dr Donnald Garre to review.  SLJ

## 2012-07-11 ENCOUNTER — Encounter: Payer: Self-pay | Admitting: Internal Medicine

## 2012-07-11 ENCOUNTER — Other Ambulatory Visit: Payer: Self-pay | Admitting: *Deleted

## 2012-07-11 ENCOUNTER — Ambulatory Visit (HOSPITAL_BASED_OUTPATIENT_CLINIC_OR_DEPARTMENT_OTHER): Payer: 59 | Admitting: Internal Medicine

## 2012-07-11 ENCOUNTER — Telehealth: Payer: Self-pay | Admitting: *Deleted

## 2012-07-11 ENCOUNTER — Ambulatory Visit
Admission: RE | Admit: 2012-07-11 | Discharge: 2012-07-11 | Disposition: A | Discharge status: Home / Self Care | Payer: 59 | Source: Ambulatory Visit | Attending: Radiation Oncology | Admitting: Radiation Oncology

## 2012-07-11 VITALS — BP 133/65 | HR 63 | Temp 97.0°F | Resp 20 | Ht 62.0 in | Wt 179.7 lb

## 2012-07-11 DIAGNOSIS — C799 Secondary malignant neoplasm of unspecified site: Secondary | ICD-10-CM

## 2012-07-11 DIAGNOSIS — C3430 Malignant neoplasm of lower lobe, unspecified bronchus or lung: Secondary | ICD-10-CM

## 2012-07-11 DIAGNOSIS — C7952 Secondary malignant neoplasm of bone marrow: Secondary | ICD-10-CM

## 2012-07-11 MED ORDER — ERLOTINIB HCL 150 MG PO TABS: 150.0000 mg | ORAL_TABLET | Freq: Every day | ORAL | Status: DC

## 2012-07-11 NOTE — Telephone Encounter (Signed)
EGFR Mutation detected in EGFR mutation testing done on soft tissue biopsy.  Results given to Dr Donnald Garre and will be scanned into EPIC.  SLJ

## 2012-07-11 NOTE — Progress Notes (Signed)
Rx for Tarceva E-prescribed to [facility], demographics and insurance info faxed to [facility].  SLJ

## 2012-07-11 NOTE — Progress Notes (Signed)
Tarceva 150mg  30 tabs has been approved from 07/11/12-07/11/13 from 07/11/12-07/11/13.

## 2012-07-11 NOTE — Progress Notes (Signed)
[facility] [phone]   [phone]  OFFICE PROGRESS NOTE  Jacqueline Islam, MD [address] Dr. Ginette Leonard [name] [address]  DIAGNOSIS: Metastatic non-small cell lung cancer, adenocarcinoma with positive EGFR mutation in exon 19 and negative ALK gene translocation diagnosed in July 2013 with bone metastasis.  PRIOR THERAPY: None  CURRENT THERAPY:  1) The patient expected to start in the next few days treatment with Tarceva 150 mg by mouth daily 2) she would also start treatment with Xgeva 120 mg subcutaneously every 4 weeks for bone disease. 3) the patient will start today the first fraction of radiotherapy to the left face rib metastasis under the care of Dr. Mitzi Leonard.  INTERVAL HISTORY: Jacqueline Leonard 66 y.o. female returns to the clinic today for followup visit accompanied by her brother-in-law who is a histopathologist in [place]. The patient is doing fine today with no specific complaint. She denied having any significant chest pain, shortness breath, cough or hemoptysis. She denied having any significant weight loss or night sweats. Her molecular studies showed positive EGFR mutation in exon 19. The patient is here today for evaluation and discussion of her treatment options.  MEDICAL HISTORY: Past Medical History  Diagnosis Date  . Lung mass   . Hypertension   . Hyperlipidemia   . Vitamin d deficiency   . Cancer, metastatic to bone 06/19/12    bx=L 5th ribmetastatic Adenocarcinoma with known lung mass    ALLERGIES:  is allergic to penicillins.  MEDICATIONS:  Current Outpatient Prescriptions  Medication Sig Dispense Refill  . amLODipine (NORVASC) 5 MG tablet Take 5 mg by mouth daily.      . cholecalciferol (VITAMIN D) 1000 UNITS tablet Take 1,000 Units by mouth daily.      . fish oil-omega-3 fatty acids 1000 MG capsule Take 1 g by mouth daily.      . Potassium 99 MG TABS Take 1 tablet by mouth daily.      . simvastatin (ZOCOR) 20  MG tablet Take 20 mg by mouth every evening.      . valsartan-hydrochlorothiazide (DIOVAN-HCT) 160-25 MG per tablet Take 1 tablet by mouth daily.        SURGICAL HISTORY:  Past Surgical History  Procedure Date  . Soft tissue biopsy 06/19/12    L 5th rib=metastatic adenocarcinoma    REVIEW OF SYSTEMS:  A comprehensive review of systems was negative.   PHYSICAL EXAMINATION: General appearance: alert, cooperative and no distress Head: Normocephalic, without obvious abnormality, atraumatic Neck: no adenopathy Lymph nodes: Cervical, supraclavicular, and axillary nodes normal. Resp: clear to auscultation bilaterally Back: symmetric, no curvature. ROM normal. No CVA tenderness. Cardio: regular rate and rhythm, S1, S2 normal, no murmur, click, rub or gallop GI: soft, non-tender; bowel sounds normal; no masses,  no organomegaly Extremities: extremities normal, atraumatic, no cyanosis or edema Neurologic: Alert and oriented X 3, normal strength and tone. Normal symmetric reflexes. Normal coordination and gait  ECOG PERFORMANCE STATUS: 1 - Symptomatic but completely ambulatory  Blood pressure 133/65, pulse 63, temperature 97 F (36.1 C), temperature source Oral, resp. rate 20, height 5\' 2"  (1.575 m), weight 179 lb 11.2 oz (81.511 kg).  LABORATORY DATA: Lab Results  Component Value Date   WBC 5.7 07/04/2012   HGB 13.9 07/04/2012   HCT 41.0 07/04/2012   MCV 85.3 07/04/2012   PLT 219 07/04/2012      Chemistry      Component Value Date/Time   NA 138 07/04/2012  1528   K 3.9 07/04/2012 1528   CL 105 07/04/2012 1528   CO2 29 07/04/2012 1249   BUN 23 07/04/2012 1528   CREATININE 0.70 07/04/2012 1528      Component Value Date/Time   CALCIUM 9.3 07/04/2012 1249   ALKPHOS 75 07/04/2012 1249   AST 17 07/04/2012 1249   ALT 21 07/04/2012 1249   BILITOT 0.6 07/04/2012 1249       RADIOGRAPHIC STUDIES: Dg Chest 1 View  06/19/2012  *RADIOLOGY REPORT*  Clinical Data: Post biopsy in lateral left chest wall lesion  question pneumothorax  CHEST - 1 VIEW  Comparison: None  Findings: Enlargement of cardiac silhouette. Mediastinal contours and pulmonary vascularity normal. Minimal peribronchial thickening. No acute infiltrate, pleural effusion, or pneumothorax. Destructive rib lesion identified at lateral aspect of the left fourth rib with associated extrapleural density. No additional osseous abnormalities seen.  IMPRESSION: No pneumothorax. Destructive left lateral fourth rib lesion. Minimal enlargement of cardiac silhouette. Minimal bronchitic changes.  Original Report Authenticated By: Jacqueline Leonard, M.D.   Mr Jacqueline Leonard Wo Contrast  07/04/2012  *RADIOLOGY REPORT*  Clinical Data: 66 year old female with new diagnosis of lung cancer.  Agitation.  MRI HEAD WITHOUT AND WITH CONTRAST  Technique:  Multiplanar, multiecho pulse sequences of the brain and surrounding structures were obtained according to standard protocol without and with intravenous contrast  Contrast: 17mL MULTIHANCE GADOBENATE DIMEGLUMINE 529 MG/ML IV SOLN  Comparison: None.  Findings: There is a patchy somewhat wedge-shaped area of enhancement in the left superior occipital lobe near the parieto- occipital sulcus.  This is partially gyriform on coronal images (series 14 image 4).  There is associated T2* signal loss suggesting blood products (series 10 image 14).  There is only subtle T2 hyperintense signal correlation and no surrounding edema, no mass effect.  No additional enhancing lesion identified.  There is evidence of a chronic linear lacunar infarct in the right posterior corona radiata (series 9 image 17).  No restricted diffusion to suggest acute infarction.  No ventriculomegaly.  No acute intracranial hemorrhage.  No midline shift or mass effect.  Aside from the above, gray and white matter signal are within normal limits.  Negative visualized cervical spine.  Negative pituitary and cervicomedullary junction.  Bone Leonard signal within normal limits.   Mild hyperostosis frontalis.  Negative scalp soft tissues. Visualized orbit soft tissues are within normal limits.  Minor paranasal sinus mucosal thickening.  IMPRESSION: 1.  Solitary somewhat gyriform area of enhancement in the superior left occipital lobe near the parieto-occipital sulcus. Associated chronic blood products. This either represents early metastatic disease to the brain, or a small late subacute to early chronic infarct with post ischemic enhancement.  Repeat study in 6 to 8 weeks recommended. 2.  Mild chronic small vessel ischemia in the right corona radiata.  Original Report Authenticated By: Harley Hallmark, M.D.   Nm Pet Image Initial (pi) Skull Base To Thigh  06/13/2012  *RADIOLOGY REPORT*  Clinical Data: Initial treatment strategy for lung mass.  NUCLEAR MEDICINE PET SKULL BASE TO THIGH  Fasting Blood Glucose:  94  Technique:  19.1 mCi F-18 FDG was injected intravenously. CT data was obtained and used for attenuation correction and anatomic localization only.  (This was not acquired as a diagnostic CT examination.) Additional exam technical data entered on technologist worksheet.  Comparison:  None  Findings:  Neck:   Small hypermetabolic right supraclavicular node measures 10 mm (image 50) with SUV max = 7.2.  Adjacent 8  mm nodal metastasis on image 48.  Chest:  Within the medial aspect of the left lower lobe there is an intensely hypermetabolic mass measuring 3.2 x 4.3 cm (image 8) with SUV max = 25.3.  Metastatic nodal disease to the subcarinal location with an intense hypermetabolic subcarinal node measuring 13 mm short axis and SUV max = 9.3.  Hypermetabolic right lower paratracheal lymph node measuring 7 mm short axis with SUV max = 6.4).  Hypermetabolic chest wall metastasis expanding the left lateral fifth rib (image 69) with SUV max = 24.4).  Abdomen/Pelvis:  No abnormal hypermetabolic activity within the liver, pancreas, adrenal glands, or spleen.  No hypermetabolic lymph nodes in  the abdomen or pelvis.  The fibroid uterus with central calcification noted.  [place]:  In addition to the left chest wall metastasis involving the fifth rib, there is a skeletal metastasis to the T12 vertebral body with SUV max = 13.9.  Additionally, at the T12 level there are paraspinal soft tissue metastasis whichare  difficult to define on the CT but are hypermetabolic (image 117 and 122).  There is subtle soft tissue thickening at this level.  There is anterolisthesis of L5 on S1 related to bilateral pars defects.  IMPRESSION:  1.  Intensely  hypermetabolic left lower lobe mass consistent with primary bronchogenic carcinoma. 2.  Mediastinal metastasis as well as contralateral right supraclavicular nodal metastasis. 3.  Hypermetabolic skeletal metastasis include the left lateral fifth rib and T12 vertebral body.  5.  Paraspinal soft tissue metastasis adjacent to the T12 vertebral body.  Original Report Authenticated By: Genevive Bi, M.D.   Ct Biopsy  06/19/2012  *RADIOLOGY REPORT*  Indication: Presumed metastatic lung cancer with hypermetabolic left 4th rib mass  CT GUIDED FINE NEEDLE ASPIRATION AND CORE NEEDLE BIOPSY OF LEFT 4th RIB MASS  Comparisons: PET CT - 06/13/2012  Medications: Fentanyl 50 mcg IV; Versed 1 mg IV  Contrast: None  Sedation time: 33 minutes  Complications: None immediate  TECHNIQUE/FINDINGS:  Informed consent was obtained from the patient following an explanation of the procedure, risks, benefits and alternatives.  A time out was performed prior to the initiation of the procedure.  The patient was positioned supine on the CT table and a limited CT was performed for procedural planning demonstrating the expansile lesion measuring approximately 1.8 x 2.9 cm involving the lateral aspect of the left fourth rib.  The procedure was planned.  The operative site was prepped and draped in the usual sterile fashion. Appropriate trajectory was confirmed with a 22 gauge spinal needle after the  adjacent tissues were anesthetized with 1% Lidocaine with epinephrine.   Under intermittent CT guidance, a 17 gauge coaxial needle was advanced into the peripheral aspect of the mass. Positioning was confirmed and 2 FNA samples were obtained with the use of a 20 gauge Francine needle.  Quick stain pathologic review confirmed the acquisition of lesional tissue.  Appropriate positioning was again confirmed and 3 additional samples were obtained with an 18 gauge core biopsy device.  The co-axial needle was removed and hemostasis was achieved with manual compression.  A limited post procedural CT was negative for hemorrhage or additional complication.  A dressing was placed.  The patient tolerated the procedure well without immediate postprocedural complication.  IMPRESSION:  Technically successful CT guided fine needle aspiration and core needle biopsy of expansile lesion involving the lateral aspect of the left fourth rib.  Original Report Authenticated By: Waynard Reeds, M.D.    ASSESSMENT: This  is a very pleasant 66 years old Hispanic female recently diagnosed with metastatic non-small cell lung cancer, adenocarcinoma with positive EGFR mutation in exon 19.   PLAN: I have a lengthy discussion with the patient and her brother-in-law regarding her current disease status and treatment options. I recommended for the patient treatment with Tarceva 150 mg by mouth daily. I discussed with her the adverse effect of this treatment including but not limited to a skin rash, diarrhea, interstitial lung disease, liver or renal dysfunction. Were sent a prescription to [facility] patient pharmacy. The patient expected to start the treatment in the next few days. For the bone metastasis she would be considered for treatment with Xgeva 120 mg subcutaneously every 4 weeks. She is expected to start the first dose in 2 weeks after she has a dental clearance. She was also advised to take calcium supplement on daily  basis. The patient come back for followup visit in 2 weeks for evaluation and management any adverse effect of her treatment. She is also expected to start the first fraction of her palliative radiotherapy under the care of Dr. Mitzi Leonard today. She was advised to call me immediately if she has any concerning symptoms and interval.  All questions were answered. The patient knows to call the clinic with any problems, questions or concerns. We can certainly see the patient much sooner if necessary.  I spent 20 minutes counseling the patient face to face. The total time spent in the appointment was 30 minutes.

## 2012-07-12 ENCOUNTER — Ambulatory Visit
Admission: RE | Admit: 2012-07-12 | Discharge: 2012-07-12 | Disposition: A | Discharge status: Home / Self Care | Payer: 59 | Source: Ambulatory Visit | Attending: Radiation Oncology | Admitting: Radiation Oncology

## 2012-07-12 ENCOUNTER — Other Ambulatory Visit: Payer: Self-pay | Admitting: *Deleted

## 2012-07-12 ENCOUNTER — Encounter: Payer: Self-pay | Admitting: Radiation Oncology

## 2012-07-12 VITALS — BP 142/84 | HR 107 | Temp 98.0°F | Resp 20 | Wt 181.0 lb

## 2012-07-12 DIAGNOSIS — C7952 Secondary malignant neoplasm of bone marrow: Secondary | ICD-10-CM

## 2012-07-12 DIAGNOSIS — C799 Secondary malignant neoplasm of unspecified site: Secondary | ICD-10-CM

## 2012-07-12 MED ORDER — RADIAPLEXRX EX GEL
Freq: Once | CUTANEOUS | Status: AC
  Administered 2012-07-12: 16:00:00 via TOPICAL

## 2012-07-12 MED ORDER — ERLOTINIB HCL 150 MG PO TABS: 150.0000 mg | ORAL_TABLET | Freq: Every day | ORAL | Status: AC

## 2012-07-12 NOTE — Telephone Encounter (Signed)
Per Karel Jarvis in medical mgmt, rx for Tarceva needs to be sent to [facility].  Rx and insurance/demographics sent to [facility].  SLJ

## 2012-07-12 NOTE — Progress Notes (Signed)
Patient spanish speaking but speaks english well also, alert,oriented x3, left rib rad tx, 2nd tx completed , post sim teaching done, radiation therapy and you book, radiaplex gel given as well with instructions of use, no c/o pain, stated pataient, vitals 98.0, 142/84., 107,20, weight =181.0lb, not taking tarceva as yet, has ben sent to [facility] out pt pharmacy by med onc, will have Dr.Moody fill out FMLA paperwork next week, states"I have to have it by Sept. 4th,2013,  No c/o pain or discomfort 4:16 PM

## 2012-07-12 NOTE — Progress Notes (Signed)
  Radiation Oncology         [phone] ________________________________  Name: Jacqueline Leonard MRN: [id number]  Date: 07/12/2012  DOB: [birth year]  Weekly Radiation Therapy Management  Current Dose: 6 Gy     Planned Dose:  30 Gy  Narrative . . . . . . . . The patient presents for routine under treatment assessment.                                                      The patient is without complaint.  She denies rib pain, cough, and dyspnea                                 Set-up films were reviewed.                                 The chart was checked. Physical Findings. . . She is in no acute distress.  Filed Vitals:   07/12/12 1616  BP: 142/84  Pulse: 107  Temp: 98 F (36.7 C)  Resp: 20  Weight essentially stable.  No significant changes. Impression . . . . . . . The patient is  tolerating radiation. Plan . . . . . . . . . . . . Continue treatment as planned.  ________________________________  Artist Pais. Kathrynn Running, M.D.

## 2012-07-15 ENCOUNTER — Encounter: Payer: Self-pay | Admitting: Medical Oncology

## 2012-07-15 ENCOUNTER — Telehealth: Payer: Self-pay | Admitting: Internal Medicine

## 2012-07-15 ENCOUNTER — Ambulatory Visit
Admission: RE | Admit: 2012-07-15 | Discharge: 2012-07-15 | Disposition: A | Discharge status: Home / Self Care | Payer: 59 | Source: Ambulatory Visit | Attending: Radiation Oncology | Admitting: Radiation Oncology

## 2012-07-15 ENCOUNTER — Telehealth: Payer: Self-pay | Admitting: Medical Oncology

## 2012-07-15 NOTE — Progress Notes (Signed)
Received note from dentist clearing pt for xgeva

## 2012-07-15 NOTE — Telephone Encounter (Signed)
[facility] transferred tarceva rx to [facility]

## 2012-07-15 NOTE — Telephone Encounter (Signed)
called pt and pt was here for rad onc,had a lang barrier,called rad/onc for them to send pt to sch upstairs

## 2012-07-16 ENCOUNTER — Encounter: Payer: Self-pay | Admitting: *Deleted

## 2012-07-16 ENCOUNTER — Other Ambulatory Visit: Payer: Self-pay | Admitting: Medical Oncology

## 2012-07-16 ENCOUNTER — Ambulatory Visit
Admission: RE | Admit: 2012-07-16 | Discharge: 2012-07-16 | Disposition: A | Discharge status: Home / Self Care | Payer: 59 | Source: Ambulatory Visit | Attending: Radiation Oncology | Admitting: Radiation Oncology

## 2012-07-16 NOTE — Progress Notes (Signed)
RECEIVED A FAX FROM [facility] CONCERNING A REASON FOR CLARIFICATION OF THE PRESCRIPTION FOR TARCEVA. THIS REQUEST WAS GIVEN TO DR.MOHAMED'S NURSE, DIANE BELL,RN.

## 2012-07-16 NOTE — Progress Notes (Signed)
Noted EGFR with mutation detected.  Dr. Arbutus Ped aware

## 2012-07-16 NOTE — Telephone Encounter (Signed)
F[facility]ed [facility]

## 2012-07-17 ENCOUNTER — Ambulatory Visit
Admission: RE | Admit: 2012-07-17 | Discharge: 2012-07-17 | Disposition: A | Discharge status: Home / Self Care | Payer: 59 | Source: Ambulatory Visit | Attending: Radiation Oncology | Admitting: Radiation Oncology

## 2012-07-18 ENCOUNTER — Other Ambulatory Visit: Payer: 59

## 2012-07-18 ENCOUNTER — Ambulatory Visit
Admission: RE | Admit: 2012-07-18 | Discharge: 2012-07-18 | Disposition: A | Discharge status: Home / Self Care | Payer: 59 | Source: Ambulatory Visit | Attending: Radiation Oncology | Admitting: Radiation Oncology

## 2012-07-18 ENCOUNTER — Encounter: Payer: Self-pay | Admitting: *Deleted

## 2012-07-18 VITALS — BP 144/60 | HR 66 | Temp 97.9°F | Wt 180.3 lb

## 2012-07-18 DIAGNOSIS — C7952 Secondary malignant neoplasm of bone marrow: Secondary | ICD-10-CM

## 2012-07-18 NOTE — Progress Notes (Signed)
Patient here for routine weekly doctor assessment.Completed 6 of 10  Radiation treatments to left rib. Denies pain.No fatigue.Started calcium last Friday 750 mg bid.Will bring tomorrow to be put in epic.

## 2012-07-18 NOTE — Progress Notes (Signed)
   Department of Radiation Oncology  Phone:  [phone] Fax:        [phone]  Weekly Treatment Note    Name: Jacqueline Leonard Date: 07/18/2012 MRN: [id number] DOB: 05-10-1946   Current dose: 18 Gy  Current fraction:6   MEDICATIONS: Current Outpatient Prescriptions  Medication Sig Dispense Refill  . calcium carbonate 200 MG capsule Take 250 mg by mouth 2 (two) times daily with a meal.      . amLODipine (NORVASC) 5 MG tablet Take 5 mg by mouth daily.      . cholecalciferol (VITAMIN D) 1000 UNITS tablet Take 1,000 Units by mouth daily.      Marland Kitchen erlotinib (TARCEVA) 150 MG tablet Take 1 tablet (150 mg total) by mouth daily. Take on an empty stomach 1 hour before meals or 2 hours after.  30 tablet  2  . fish oil-omega-3 fatty acids 1000 MG capsule Take 1 g by mouth daily.      . Potassium 99 MG TABS Take 1 tablet by mouth daily.      . simvastatin (ZOCOR) 20 MG tablet Take 20 mg by mouth every evening.      . valsartan-hydrochlorothiazide (DIOVAN-HCT) 160-25 MG per tablet Take 1 tablet by mouth daily.         ALLERGIES: Penicillins   LABORATORY DATA:  Lab Results  Component Value Date   WBC 5.7 07/04/2012   HGB 13.9 07/04/2012   HCT 41.0 07/04/2012   MCV 85.3 07/04/2012   PLT 219 07/04/2012   Lab Results  Component Value Date   NA 138 07/04/2012   K 3.9 07/04/2012   CL 105 07/04/2012   CO2 29 07/04/2012   Lab Results  Component Value Date   ALT 21 07/04/2012   AST 17 07/04/2012   ALKPHOS 75 07/04/2012   BILITOT 0.6 07/04/2012     NARRATIVE: Jacqueline Leonard was seen today for weekly treatment management. The chart was checked and the patient's films were reviewed. The patient is doing very well. No pain in the chest. No changes in shortness of breath.  PHYSICAL EXAMINATION: weight is 180 lb 4.8 oz (81.784 kg). Her temperature is 97.9 F (36.6 C). Her blood pressure is 144/60 and her pulse is 66. Her oxygen saturation is 98%.        ASSESSMENT: The patient is doing satisfactorily with  treatment.  PLAN: We will continue with the patient's radiation treatment as planned.

## 2012-07-19 ENCOUNTER — Ambulatory Visit
Admission: RE | Admit: 2012-07-19 | Discharge: 2012-07-19 | Disposition: A | Discharge status: Home / Self Care | Payer: 59 | Source: Ambulatory Visit | Attending: Radiation Oncology | Admitting: Radiation Oncology

## 2012-07-22 ENCOUNTER — Ambulatory Visit
Admission: RE | Admit: 2012-07-22 | Discharge: 2012-07-22 | Disposition: A | Discharge status: Home / Self Care | Payer: 59 | Source: Ambulatory Visit | Attending: Radiation Oncology | Admitting: Radiation Oncology

## 2012-07-23 ENCOUNTER — Encounter: Payer: Self-pay | Admitting: Internal Medicine

## 2012-07-23 ENCOUNTER — Ambulatory Visit
Admission: RE | Admit: 2012-07-23 | Discharge: 2012-07-23 | Disposition: A | Discharge status: Home / Self Care | Payer: 59 | Source: Ambulatory Visit | Attending: Radiation Oncology | Admitting: Radiation Oncology

## 2012-07-24 ENCOUNTER — Ambulatory Visit
Admission: RE | Admit: 2012-07-24 | Discharge: 2012-07-24 | Disposition: A | Discharge status: Home / Self Care | Payer: 59 | Source: Ambulatory Visit | Attending: Radiation Oncology | Admitting: Radiation Oncology

## 2012-07-24 ENCOUNTER — Ambulatory Visit (HOSPITAL_BASED_OUTPATIENT_CLINIC_OR_DEPARTMENT_OTHER): Payer: 59 | Admitting: Physician Assistant

## 2012-07-24 ENCOUNTER — Ambulatory Visit (HOSPITAL_BASED_OUTPATIENT_CLINIC_OR_DEPARTMENT_OTHER): Payer: 59

## 2012-07-24 ENCOUNTER — Telehealth: Payer: Self-pay | Admitting: Internal Medicine

## 2012-07-24 ENCOUNTER — Other Ambulatory Visit (HOSPITAL_BASED_OUTPATIENT_CLINIC_OR_DEPARTMENT_OTHER): Payer: 59 | Admitting: Lab

## 2012-07-24 ENCOUNTER — Encounter: Payer: Self-pay | Admitting: Physician Assistant

## 2012-07-24 VITALS — BP 137/66 | HR 63 | Temp 97.5°F | Resp 20 | Ht 62.0 in | Wt 178.0 lb

## 2012-07-24 DIAGNOSIS — C7952 Secondary malignant neoplasm of bone marrow: Secondary | ICD-10-CM

## 2012-07-24 DIAGNOSIS — C7951 Secondary malignant neoplasm of bone: Secondary | ICD-10-CM

## 2012-07-24 DIAGNOSIS — C3430 Malignant neoplasm of lower lobe, unspecified bronchus or lung: Secondary | ICD-10-CM

## 2012-07-24 DIAGNOSIS — C799 Secondary malignant neoplasm of unspecified site: Secondary | ICD-10-CM

## 2012-07-24 LAB — COMPREHENSIVE METABOLIC PANEL (CC13)
AST: 17 U/L (ref 5–34)
Alkaline Phosphatase: 68 U/L (ref 40–150)
BUN: 20 mg/dL (ref 7.0–26.0)
Calcium: 9 mg/dL (ref 8.4–10.4)
Chloride: 104 mEq/L (ref 98–107)
Creatinine: 0.8 mg/dL (ref 0.6–1.1)
Total Bilirubin: 0.9 mg/dL (ref 0.20–1.20)
Total Protein: 6.7 g/dL (ref 6.4–8.3)

## 2012-07-24 LAB — CBC WITH DIFFERENTIAL/PLATELET
BASO%: 0.4 % (ref 0.0–2.0)
Basophils Absolute: 0 10e3/uL (ref 0.0–0.1)
EOS%: 1.7 % (ref 0.0–7.0)
Eosinophils Absolute: 0.1 10e3/uL (ref 0.0–0.5)
HCT: 37 % (ref 34.8–46.6)
HGB: 12.4 g/dL (ref 11.6–15.9)
LYMPH%: 14.6 % (ref 14.0–49.7)
MCH: 29.2 pg (ref 25.1–34.0)
MCHC: 33.5 g/dL (ref 31.5–36.0)
MCV: 87 fL (ref 79.5–101.0)
MONO#: 0.6 10e3/uL (ref 0.1–0.9)
MONO%: 9.8 % (ref 0.0–14.0)
NEUT#: 4.7 10e3/uL (ref 1.5–6.5)
NEUT%: 73.5 % (ref 38.4–76.8)
Platelets: 208 10e3/uL (ref 145–400)
RBC: 4.25 10e6/uL (ref 3.70–5.45)
RDW: 13.2 % (ref 11.2–14.5)
WBC: 6.4 10e3/uL (ref 3.9–10.3)
lymph#: 0.9 10e3/uL (ref 0.9–3.3)

## 2012-07-24 MED ORDER — DENOSUMAB 120 MG/1.7ML ~~LOC~~ SOLN
120.0000 mg | Freq: Once | SUBCUTANEOUS | Status: AC
  Administered 2012-07-24: 120 mg via SUBCUTANEOUS
  Filled 2012-07-24: qty 1.7

## 2012-07-24 NOTE — Telephone Encounter (Signed)
Gave pt appt calendar for September 2013 lab and ML °

## 2012-07-24 NOTE — Patient Instructions (Addendum)
Continue taking Tarceva 150 mg by mouth daily.  Return in 2 weeks for another lab and symptom management visit

## 2012-07-25 ENCOUNTER — Other Ambulatory Visit: Payer: 59 | Admitting: Lab

## 2012-07-25 ENCOUNTER — Ambulatory Visit: Payer: 59 | Admitting: Physician Assistant

## 2012-07-25 ENCOUNTER — Ambulatory Visit: Payer: 59

## 2012-07-25 NOTE — Progress Notes (Signed)
  Radiation Oncology         [phone] ________________________________  Name: Jacqueline Leonard MRN: [id number]  Date: 07/10/2012  DOB: [birth year]  Simulation Verification Note  Status: outpatient  NARRATIVE: The patient was brought to the treatment unit and placed in the planned treatment position. The clinical setup was verified. Then port films were obtained and uploaded to the radiation oncology medical record software.  The treatment beams were carefully compared against the planned radiation fields. The position location and shape of the radiation fields was reviewed. They targeted volume of tissue appears to be appropriately covered by the radiation beams. Organs at risk appear to be excluded as planned.  Based on my personal review, I approved the simulation verification. The patient's treatment will proceed as planned.  ------------------------------------------------  Artist Pais Kathrynn Running, M.D.

## 2012-07-26 ENCOUNTER — Ambulatory Visit: Payer: 59

## 2012-07-30 ENCOUNTER — Ambulatory Visit: Payer: 59

## 2012-07-31 ENCOUNTER — Ambulatory Visit: Payer: 59

## 2012-08-01 ENCOUNTER — Ambulatory Visit: Payer: 59

## 2012-08-02 ENCOUNTER — Ambulatory Visit: Payer: 59

## 2012-08-04 NOTE — Progress Notes (Signed)
[facility] [phone]   [phone]  OFFICE PROGRESS NOTE  [name], MD [address] [name] [address]  DIAGNOSIS: Metastatic non-small cell lung cancer, adenocarcinoma with positive EGFR mutation in exon 19 and negative ALK gene translocation diagnosed in July 2013 with bone metastasis.  PRIOR THERAPY: None  CURRENT THERAPY:  1)  treatment with Tarceva 150 mg by mouth daily, therapy beginning 07/20/2012 2) she would also start treatment with Xgeva 120 mg subcutaneously every 4 weeks for bone disease. 3) the patient is undergoing radiotherapy to the left face rib metastasis under the care of Dr. Mitzi Hansen.  INTERVAL HISTORY: Jacqueline Leonard 66 y.o. female returns to the clinic today for followup visit.  The patient is doing fine today with no specific complaint. She is taking the Tarceva daily at 11:30 AM and is having no difficulty. She denied any diarrhea or any significant skin rash. She denied having any significant chest pain, shortness breath, cough or hemoptysis. She denied having any significant weight loss or night sweats. She reports she had her last fraction of radiotherapy today.  MEDICAL HISTORY: Past Medical History  Diagnosis Date  . Lung mass   . Hypertension   . Hyperlipidemia   . Vitamin d deficiency   . Cancer, metastatic to bone 06/19/12    bx=L 5th ribmetastatic Adenocarcinoma with known lung mass    ALLERGIES:  is allergic to penicillins.  MEDICATIONS:  Current Outpatient Prescriptions  Medication Sig Dispense Refill  . amLODipine (NORVASC) 5 MG tablet Take 5 mg by mouth daily.      . calcium carbonate 200 MG capsule Take 250 mg by mouth 2 (two) times daily with a meal.      . cholecalciferol (VITAMIN D) 1000 UNITS tablet Take 1,000 Units by mouth daily.      Marland Kitchen erlotinib (TARCEVA) 150 MG tablet Take 1 tablet (150 mg total) by mouth daily. Take on an empty stomach 1 hour before meals or 2 hours after.   30 tablet  2  . fish oil-omega-3 fatty acids 1000 MG capsule Take 1 g by mouth daily.      . Multiple Minerals-Vitamins (CALCIUM & VIT D3 BONE HEALTH PO) Take 750 mg by mouth 2 (two) times daily.      . Potassium 99 MG TABS Take 1 tablet by mouth daily.      . simvastatin (ZOCOR) 20 MG tablet Take 20 mg by mouth every evening.      . valsartan-hydrochlorothiazide (DIOVAN-HCT) 160-25 MG per tablet Take 1 tablet by mouth daily.        SURGICAL HISTORY:  Past Surgical History  Procedure Date  . Soft tissue biopsy 06/19/12    L 5th rib=metastatic adenocarcinoma    REVIEW OF SYSTEMS:  A comprehensive review of systems was negative.   PHYSICAL EXAMINATION: General appearance: alert, cooperative and no distress Head: Normocephalic, without obvious abnormality, atraumatic Neck: no adenopathy Lymph nodes: Cervical, supraclavicular, and axillary nodes normal. Resp: clear to auscultation bilaterally Back: symmetric, no curvature. ROM normal. No CVA tenderness. Cardio: regular rate and rhythm, S1, S2 normal, no murmur, click, rub or gallop GI: soft, non-tender; bowel sounds normal; no masses,  no organomegaly Extremities: extremities normal, atraumatic, no cyanosis or edema Neurologic: Alert and oriented X 3, normal strength and tone. Normal symmetric reflexes. Normal coordination and gait  ECOG PERFORMANCE STATUS: 1 - Symptomatic but completely ambulatory  Blood pressure 137/66, pulse 63, temperature 97.5 F (36.4 C),  temperature source Oral, resp. rate 20, height 5\' 2"  (1.575 m), weight 178 lb (80.74 kg).  LABORATORY DATA: Lab Results  Component Value Date   WBC 6.4 07/24/2012   HGB 12.4 07/24/2012   HCT 37.0 07/24/2012   MCV 87.0 07/24/2012   PLT 208 07/24/2012      Chemistry      Component Value Date/Time   NA 139 07/24/2012 1201   NA 138 07/04/2012 1528   K 3.9 07/24/2012 1201   K 3.9 07/04/2012 1528   CL 104 07/24/2012 1201   CL 105 07/04/2012 1528   CO2 27 07/24/2012 1201   CO2 29  07/04/2012 1249   BUN 20.0 07/24/2012 1201   BUN 23 07/04/2012 1528   CREATININE 0.8 07/24/2012 1201   CREATININE 0.70 07/04/2012 1528      Component Value Date/Time   CALCIUM 9.0 07/24/2012 1201   CALCIUM 9.3 07/04/2012 1249   ALKPHOS 68 07/24/2012 1201   ALKPHOS 75 07/04/2012 1249   AST 17 07/24/2012 1201   AST 17 07/04/2012 1249   ALT 22 07/24/2012 1201   ALT 21 07/04/2012 1249   BILITOT 0.90 07/24/2012 1201   BILITOT 0.6 07/04/2012 1249       RADIOGRAPHIC STUDIES: Dg Chest 1 View  06/19/2012  *RADIOLOGY REPORT*  Clinical Data: Post biopsy in lateral left chest wall lesion question pneumothorax  CHEST - 1 VIEW  Comparison: None  Findings: Enlargement of cardiac silhouette. Mediastinal contours and pulmonary vascularity normal. Minimal peribronchial thickening. No acute infiltrate, pleural effusion, or pneumothorax. Destructive rib lesion identified at lateral aspect of the left fourth rib with associated extrapleural density. No additional osseous abnormalities seen.  IMPRESSION: No pneumothorax. Destructive left lateral fourth rib lesion. Minimal enlargement of cardiac silhouette. Minimal bronchitic changes.  Original Report Authenticated By: Lollie Marrow, M.D.   Mr Jacqueline Leonard Wo Contrast  07/04/2012  *RADIOLOGY REPORT*  Clinical Data: 66 year old female with new diagnosis of lung cancer.  Agitation.  MRI HEAD WITHOUT AND WITH CONTRAST  Technique:  Multiplanar, multiecho pulse sequences of the brain and surrounding structures were obtained according to standard protocol without and with intravenous contrast  Contrast: 17mL MULTIHANCE GADOBENATE DIMEGLUMINE 529 MG/ML IV SOLN  Comparison: None.  Findings: There is a patchy somewhat wedge-shaped area of enhancement in the left superior occipital lobe near the parieto- occipital sulcus.  This is partially gyriform on coronal images (series 14 image 4).  There is associated T2* signal loss suggesting blood products (series 10 image 14).  There is only subtle T2  hyperintense signal correlation and no surrounding edema, no mass effect.  No additional enhancing lesion identified.  There is evidence of a chronic linear lacunar infarct in the right posterior corona radiata (series 9 image 17).  No restricted diffusion to suggest acute infarction.  No ventriculomegaly.  No acute intracranial hemorrhage.  No midline shift or mass effect.  Aside from the above, gray and white matter signal are within normal limits.  Negative visualized cervical spine.  Negative pituitary and cervicomedullary junction.  Bone marrow signal within normal limits.  Mild hyperostosis frontalis.  Negative scalp soft tissues. Visualized orbit soft tissues are within normal limits.  Minor paranasal sinus mucosal thickening.  IMPRESSION: 1.  Solitary somewhat gyriform area of enhancement in the superior left occipital lobe near the parieto-occipital sulcus. Associated chronic blood products. This either represents early metastatic disease to the brain, or a small late subacute to early chronic infarct with post ischemic enhancement.  Repeat study  in 6 to 8 weeks recommended. 2.  Mild chronic small vessel ischemia in the right corona radiata.  Original Report Authenticated By: Harley Hallmark, M.D.   Nm Pet Image Initial (pi) Skull Base To Thigh  06/13/2012  *RADIOLOGY REPORT*  Clinical Data: Initial treatment strategy for lung mass.  NUCLEAR MEDICINE PET SKULL BASE TO THIGH  Fasting Blood Glucose:  94  Technique:  19.1 mCi F-18 FDG was injected intravenously. CT data was obtained and used for attenuation correction and anatomic localization only.  (This was not acquired as a diagnostic CT examination.) Additional exam technical data entered on technologist worksheet.  Comparison:  None  Findings:  Neck:   Small hypermetabolic right supraclavicular node measures 10 mm (image 50) with SUV max = 7.2.  Adjacent 8 mm nodal metastasis on image 48.  Chest:  Within the medial aspect of the left lower lobe there  is an intensely hypermetabolic mass measuring 3.2 x 4.3 cm (image 8) with SUV max = 25.3.  Metastatic nodal disease to the subcarinal location with an intense hypermetabolic subcarinal node measuring 13 mm short axis and SUV max = 9.3.  Hypermetabolic right lower paratracheal lymph node measuring 7 mm short axis with SUV max = 6.4).  Hypermetabolic chest wall metastasis expanding the left lateral fifth rib (image 69) with SUV max = 24.4).  Abdomen/Pelvis:  No abnormal hypermetabolic activity within the liver, pancreas, adrenal glands, or spleen.  No hypermetabolic lymph nodes in the abdomen or pelvis.  The fibroid uterus with central calcification noted.  [place]:  In addition to the left chest wall metastasis involving the fifth rib, there is a skeletal metastasis to the T12 vertebral body with SUV max = 13.9.  Additionally, at the T12 level there are paraspinal soft tissue metastasis whichare  difficult to define on the CT but are hypermetabolic (image 117 and 122).  There is subtle soft tissue thickening at this level.  There is anterolisthesis of L5 on S1 related to bilateral pars defects.  IMPRESSION:  1.  Intensely  hypermetabolic left lower lobe mass consistent with primary bronchogenic carcinoma. 2.  Mediastinal metastasis as well as contralateral right supraclavicular nodal metastasis. 3.  Hypermetabolic skeletal metastasis include the left lateral fifth rib and T12 vertebral body.  5.  Paraspinal soft tissue metastasis adjacent to the T12 vertebral body.  Original Report Authenticated By: Genevive Bi, M.D.   Ct Biopsy  06/19/2012  *RADIOLOGY REPORT*  Indication: Presumed metastatic lung cancer with hypermetabolic left 4th rib mass  CT GUIDED FINE NEEDLE ASPIRATION AND CORE NEEDLE BIOPSY OF LEFT 4th RIB MASS  Comparisons: PET CT - 06/13/2012  Medications: Fentanyl 50 mcg IV; Versed 1 mg IV  Contrast: None  Sedation time: 33 minutes  Complications: None immediate  TECHNIQUE/FINDINGS:  Informed consent  was obtained from the patient following an explanation of the procedure, risks, benefits and alternatives.  A time out was performed prior to the initiation of the procedure.  The patient was positioned supine on the CT table and a limited CT was performed for procedural planning demonstrating the expansile lesion measuring approximately 1.8 x 2.9 cm involving the lateral aspect of the left fourth rib.  The procedure was planned.  The operative site was prepped and draped in the usual sterile fashion. Appropriate trajectory was confirmed with a 22 gauge spinal needle after the adjacent tissues were anesthetized with 1% Lidocaine with epinephrine.   Under intermittent CT guidance, a 17 gauge coaxial needle was advanced into the peripheral  aspect of the mass. Positioning was confirmed and 2 FNA samples were obtained with the use of a 20 gauge Francine needle.  Quick stain pathologic review confirmed the acquisition of lesional tissue.  Appropriate positioning was again confirmed and 3 additional samples were obtained with an 18 gauge core biopsy device.  The co-axial needle was removed and hemostasis was achieved with manual compression.  A limited post procedural CT was negative for hemorrhage or additional complication.  A dressing was placed.  The patient tolerated the procedure well without immediate postprocedural complication.  IMPRESSION:  Technically successful CT guided fine needle aspiration and core needle biopsy of expansile lesion involving the lateral aspect of the left fourth rib.  Original Report Authenticated By: Waynard Reeds, M.D.    ASSESSMENT/PLAN: This is a very pleasant 66 years old Hispanic female recently diagnosed with metastatic non-small cell lung cancer, adenocarcinoma with positive EGFR mutation in exon 19. She is currently on Tarceva at 150 mg by mouth daily status post approximately 3 days of therapy. Patient was discussed with Dr. Arbutus Ped. She will continue on the Tarceva at the  current dose and return in 2 weeks for another symptom management visit with a repeat CBC differential and C. met.  Laural Benes, Karmyn Lowman E, PA-C   All questions were answered. The patient knows to call the clinic with any problems, questions or concerns. We can certainly see the patient much sooner if necessary.  I spent 20 minutes counseling the patient face to face. The total time spent in the appointment was 30 minutes.

## 2012-08-05 ENCOUNTER — Ambulatory Visit: Payer: 59

## 2012-08-06 ENCOUNTER — Ambulatory Visit: Payer: 59

## 2012-08-07 ENCOUNTER — Ambulatory Visit (HOSPITAL_BASED_OUTPATIENT_CLINIC_OR_DEPARTMENT_OTHER): Payer: 59 | Admitting: Physician Assistant

## 2012-08-07 ENCOUNTER — Ambulatory Visit: Payer: 59

## 2012-08-07 ENCOUNTER — Telehealth: Payer: Self-pay | Admitting: Internal Medicine

## 2012-08-07 ENCOUNTER — Other Ambulatory Visit (HOSPITAL_BASED_OUTPATIENT_CLINIC_OR_DEPARTMENT_OTHER): Payer: 59 | Admitting: Lab

## 2012-08-07 VITALS — BP 152/68 | HR 68 | Temp 97.8°F | Resp 20 | Ht 62.0 in | Wt 177.0 lb

## 2012-08-07 DIAGNOSIS — C3430 Malignant neoplasm of lower lobe, unspecified bronchus or lung: Secondary | ICD-10-CM

## 2012-08-07 DIAGNOSIS — C801 Malignant (primary) neoplasm, unspecified: Secondary | ICD-10-CM

## 2012-08-07 DIAGNOSIS — C7952 Secondary malignant neoplasm of bone marrow: Secondary | ICD-10-CM

## 2012-08-07 DIAGNOSIS — C799 Secondary malignant neoplasm of unspecified site: Secondary | ICD-10-CM

## 2012-08-07 LAB — COMPREHENSIVE METABOLIC PANEL (CC13)
AST: 19 U/L (ref 5–34)
BUN: 26 mg/dL (ref 7.0–26.0)
CO2: 27 mEq/L (ref 22–29)
Calcium: 9 mg/dL (ref 8.4–10.4)
Chloride: 103 mEq/L (ref 98–107)
Creatinine: 0.9 mg/dL (ref 0.6–1.1)
Total Bilirubin: 0.6 mg/dL (ref 0.20–1.20)

## 2012-08-07 LAB — CBC WITH DIFFERENTIAL/PLATELET
BASO%: 2.8 % — ABNORMAL HIGH (ref 0.0–2.0)
Basophils Absolute: 0.2 10*3/uL — ABNORMAL HIGH (ref 0.0–0.1)
EOS%: 2.6 % (ref 0.0–7.0)
HCT: 35.8 % (ref 34.8–46.6)
HGB: 11.9 g/dL (ref 11.6–15.9)
LYMPH%: 9.5 % — ABNORMAL LOW (ref 14.0–49.7)
MCH: 28.7 pg (ref 25.1–34.0)
MCHC: 33.1 g/dL (ref 31.5–36.0)
NEUT%: 79.7 % — ABNORMAL HIGH (ref 38.4–76.8)
Platelets: 217 10*3/uL (ref 145–400)
lymph#: 0.7 10*3/uL — ABNORMAL LOW (ref 0.9–3.3)

## 2012-08-07 NOTE — Telephone Encounter (Signed)
Gave pt appt for October 2013 lab and MD 

## 2012-08-07 NOTE — Patient Instructions (Addendum)
Follow-up in one month with repeat labs 

## 2012-08-08 ENCOUNTER — Ambulatory Visit: Payer: 59

## 2012-08-09 ENCOUNTER — Ambulatory Visit: Payer: 59

## 2012-08-12 ENCOUNTER — Ambulatory Visit: Payer: 59

## 2012-08-13 ENCOUNTER — Ambulatory Visit: Payer: 59

## 2012-08-14 ENCOUNTER — Ambulatory Visit: Payer: 59

## 2012-08-14 NOTE — Progress Notes (Signed)
[facility] [phone]   [phone]  OFFICE PROGRESS NOTE  [name], MD [address] [name] [address]  DIAGNOSIS: Metastatic non-small cell lung cancer, adenocarcinoma with positive EGFR mutation in exon 19 and negative ALK gene translocation diagnosed in July 2013 with bone metastasis.  PRIOR THERAPY: Status post radiotherapy to the left face rib metastasis under the care of Dr. Mitzi Hansen.  CURRENT THERAPY:  1)  treatment with Tarceva 150 mg by mouth daily, therapy beginning 07/20/2012 2)  Xgeva 120 mg subcutaneously every 4 weeks for bone disease.   INTERVAL HISTORY: Jacqueline Leonard 66 y.o. female returns to the clinic today for another symptom management visit.  The patient is doing fine today with no specific complaint. She continues to tolerate the Tarceva relatively well. The rash related to the Tarceva slightly worse particularly on the face and now involves the neck upper chest and upper back. She's not having any diarrhea.  She denied having any significant chest pain, shortness breath, cough or hemoptysis. She denied having any significant weight loss or night sweats. She tolerated her first injection of Xgeva without difficulty.  MEDICAL HISTORY: Past Medical History  Diagnosis Date  . Lung mass   . Hypertension   . Hyperlipidemia   . Vitamin d deficiency   . Cancer, metastatic to bone 06/19/12    bx=L 5th ribmetastatic Adenocarcinoma with known lung mass    ALLERGIES:  is allergic to penicillins.  MEDICATIONS:  Current Outpatient Prescriptions  Medication Sig Dispense Refill  . amLODipine (NORVASC) 5 MG tablet Take 5 mg by mouth daily.      . calcium carbonate 200 MG capsule Take 250 mg by mouth 2 (two) times daily with a meal.      . cholecalciferol (VITAMIN D) 1000 UNITS tablet Take 1,000 Units by mouth daily.      . fish oil-omega-3 fatty acids 1000 MG capsule Take 1 g by mouth daily.      . Multiple  Minerals-Vitamins (CALCIUM & VIT D3 BONE HEALTH PO) Take 750 mg by mouth 2 (two) times daily.      . Potassium 99 MG TABS Take 1 tablet by mouth daily.      . simvastatin (ZOCOR) 20 MG tablet Take 20 mg by mouth every evening.      . valsartan-hydrochlorothiazide (DIOVAN-HCT) 160-25 MG per tablet Take 1 tablet by mouth daily.        SURGICAL HISTORY:  Past Surgical History  Procedure Date  . Soft tissue biopsy 06/19/12    L 5th rib=metastatic adenocarcinoma    REVIEW OF SYSTEMS:  Pertinent items are noted in HPI.   PHYSICAL EXAMINATION: General appearance: alert, cooperative and no distress Head: Normocephalic, without obvious abnormality, atraumatic Neck: no adenopathy Lymph nodes: Cervical, supraclavicular, and axillary nodes normal. Resp: clear to auscultation bilaterally Back: symmetric, no curvature. ROM normal. No CVA tenderness. Cardio: regular rate and rhythm, S1, S2 normal, no murmur, click, rub or gallop GI: soft, non-tender; bowel sounds normal; no masses,  no organomegaly Extremities: extremities normal, atraumatic, no cyanosis or edema Neurologic: Alert and oriented X 3, normal strength and tone. Normal symmetric reflexes. Normal coordination and gait Skin: Reveals erythematous acneiform eruptions scattered over the face anterior neck upper anterior chest and upper back, no evidence of superinfection  ECOG PERFORMANCE STATUS: 1 - Symptomatic but completely ambulatory  Blood pressure 152/68, pulse 68, temperature 97.8 F (36.6 C), temperature source Oral, resp. rate 20, height 5'  2" (1.575 m), weight 177 lb (80.287 kg).  LABORATORY DATA: Lab Results  Component Value Date   WBC 7.8 08/07/2012   HGB 11.9 08/07/2012   HCT 35.8 08/07/2012   MCV 86.7 08/07/2012   PLT 217 08/07/2012      Chemistry      Component Value Date/Time   NA 138 08/07/2012 1424   NA 138 07/04/2012 1528   K 3.9 08/07/2012 1424   K 3.9 07/04/2012 1528   CL 103 08/07/2012 1424   CL 105 07/04/2012 1528    CO2 27 08/07/2012 1424   CO2 29 07/04/2012 1249   BUN 26.0 08/07/2012 1424   BUN 23 07/04/2012 1528   CREATININE 0.9 08/07/2012 1424   CREATININE 0.70 07/04/2012 1528      Component Value Date/Time   CALCIUM 9.0 08/07/2012 1424   CALCIUM 9.3 07/04/2012 1249   ALKPHOS 65 08/07/2012 1424   ALKPHOS 75 07/04/2012 1249   AST 19 08/07/2012 1424   AST 17 07/04/2012 1249   ALT 26 08/07/2012 1424   ALT 21 07/04/2012 1249   BILITOT 0.60 08/07/2012 1424   BILITOT 0.6 07/04/2012 1249       RADIOGRAPHIC STUDIES: Dg Chest 1 View  06/19/2012  *RADIOLOGY REPORT*  Clinical Data: Post biopsy in lateral left chest wall lesion question pneumothorax  CHEST - 1 VIEW  Comparison: None  Findings: Enlargement of cardiac silhouette. Mediastinal contours and pulmonary vascularity normal. Minimal peribronchial thickening. No acute infiltrate, pleural effusion, or pneumothorax. Destructive rib lesion identified at lateral aspect of the left fourth rib with associated extrapleural density. No additional osseous abnormalities seen.  IMPRESSION: No pneumothorax. Destructive left lateral fourth rib lesion. Minimal enlargement of cardiac silhouette. Minimal bronchitic changes.  Original Report Authenticated By: Lollie Marrow, M.D.   Mr [name] Wo Contrast  07/04/2012  *RADIOLOGY REPORT*  Clinical Data: 66 year old female with new diagnosis of lung cancer.  Agitation.  MRI HEAD WITHOUT AND WITH CONTRAST  Technique:  Multiplanar, multiecho pulse sequences of the brain and surrounding structures were obtained according to standard protocol without and with intravenous contrast  Contrast: 17mL MULTIHANCE GADOBENATE DIMEGLUMINE 529 MG/ML IV SOLN  Comparison: None.  Findings: There is a patchy somewhat wedge-shaped area of enhancement in the left superior occipital lobe near the parieto- occipital sulcus.  This is partially gyriform on coronal images (series 14 image 4).  There is associated T2* signal loss suggesting blood products (series 10 image  14).  There is only subtle T2 hyperintense signal correlation and no surrounding edema, no mass effect.  No additional enhancing lesion identified.  There is evidence of a chronic linear lacunar infarct in the right posterior corona radiata (series 9 image 17).  No restricted diffusion to suggest acute infarction.  No ventriculomegaly.  No acute intracranial hemorrhage.  No midline shift or mass effect.  Aside from the above, gray and white matter signal are within normal limits.  Negative visualized cervical spine.  Negative pituitary and cervicomedullary junction.  Bone marrow signal within normal limits.  Mild hyperostosis frontalis.  Negative scalp soft tissues. Visualized orbit soft tissues are within normal limits.  Minor paranasal sinus mucosal thickening.  IMPRESSION: 1.  Solitary somewhat gyriform area of enhancement in the superior left occipital lobe near the parieto-occipital sulcus. Associated chronic blood products. This either represents early metastatic disease to the brain, or a small late subacute to early chronic infarct with post ischemic enhancement.  Repeat study in 6 to 8 weeks recommended. 2.  Mild chronic small vessel ischemia in the right corona radiata.  Original Report Authenticated By: Harley Hallmark, M.D.   Nm Pet Image Initial (pi) Skull Base To Thigh  06/13/2012  *RADIOLOGY REPORT*  Clinical Data: Initial treatment strategy for lung mass.  NUCLEAR MEDICINE PET SKULL BASE TO THIGH  Fasting Blood Glucose:  94  Technique:  19.1 mCi F-18 FDG was injected intravenously. CT data was obtained and used for attenuation correction and anatomic localization only.  (This was not acquired as a diagnostic CT examination.) Additional exam technical data entered on technologist worksheet.  Comparison:  None  Findings:  Neck:   Small hypermetabolic right supraclavicular node measures 10 mm (image 50) with SUV max = 7.2.  Adjacent 8 mm nodal metastasis on image 48.  Chest:  Within the medial aspect  of the left lower lobe there is an intensely hypermetabolic mass measuring 3.2 x 4.3 cm (image 8) with SUV max = 25.3.  Metastatic nodal disease to the subcarinal location with an intense hypermetabolic subcarinal node measuring 13 mm short axis and SUV max = 9.3.  Hypermetabolic right lower paratracheal lymph node measuring 7 mm short axis with SUV max = 6.4).  Hypermetabolic chest wall metastasis expanding the left lateral fifth rib (image 69) with SUV max = 24.4).  Abdomen/Pelvis:  No abnormal hypermetabolic activity within the liver, pancreas, adrenal glands, or spleen.  No hypermetabolic lymph nodes in the abdomen or pelvis.  The fibroid uterus with central calcification noted.  [place]:  In addition to the left chest wall metastasis involving the fifth rib, there is a skeletal metastasis to the T12 vertebral body with SUV max = 13.9.  Additionally, at the T12 level there are paraspinal soft tissue metastasis whichare  difficult to define on the CT but are hypermetabolic (image 117 and 122).  There is subtle soft tissue thickening at this level.  There is anterolisthesis of L5 on S1 related to bilateral pars defects.  IMPRESSION:  1.  Intensely  hypermetabolic left lower lobe mass consistent with primary bronchogenic carcinoma. 2.  Mediastinal metastasis as well as contralateral right supraclavicular nodal metastasis. 3.  Hypermetabolic skeletal metastasis include the left lateral fifth rib and T12 vertebral body.  5.  Paraspinal soft tissue metastasis adjacent to the T12 vertebral body.  Original Report Authenticated By: Genevive Bi, M.D.   Ct Biopsy  06/19/2012  *RADIOLOGY REPORT*  Indication: Presumed metastatic lung cancer with hypermetabolic left 4th rib mass  CT GUIDED FINE NEEDLE ASPIRATION AND CORE NEEDLE BIOPSY OF LEFT 4th RIB MASS  Comparisons: PET CT - 06/13/2012  Medications: Fentanyl 50 mcg IV; Versed 1 mg IV  Contrast: None  Sedation time: 33 minutes  Complications: None immediate   TECHNIQUE/FINDINGS:  Informed consent was obtained from the patient following an explanation of the procedure, risks, benefits and alternatives.  A time out was performed prior to the initiation of the procedure.  The patient was positioned supine on the CT table and a limited CT was performed for procedural planning demonstrating the expansile lesion measuring approximately 1.8 x 2.9 cm involving the lateral aspect of the left fourth rib.  The procedure was planned.  The operative site was prepped and draped in the usual sterile fashion. Appropriate trajectory was confirmed with a 22 gauge spinal needle after the adjacent tissues were anesthetized with 1% Lidocaine with epinephrine.   Under intermittent CT guidance, a 17 gauge coaxial needle was advanced into the peripheral aspect of the mass. Positioning was confirmed and  2 FNA samples were obtained with the use of a 20 gauge Francine needle.  Quick stain pathologic review confirmed the acquisition of lesional tissue.  Appropriate positioning was again confirmed and 3 additional samples were obtained with an 18 gauge core biopsy device.  The co-axial needle was removed and hemostasis was achieved with manual compression.  A limited post procedural CT was negative for hemorrhage or additional complication.  A dressing was placed.  The patient tolerated the procedure well without immediate postprocedural complication.  IMPRESSION:  Technically successful CT guided fine needle aspiration and core needle biopsy of expansile lesion involving the lateral aspect of the left fourth rib.  Original Report Authenticated By: Waynard Reeds, M.D.    ASSESSMENT/PLAN: This is a very pleasant 66 years old Hispanic female recently diagnosed with metastatic non-small cell lung cancer, adenocarcinoma with positive EGFR mutation in exon 19. She is currently on Tarceva at 150 mg by mouth daily status post approximately 3 weeks of therapy. Patient was discussed with Dr. Arbutus Ped.  She will continue on the Tarceva at the current dose and return in one month for another symptom management visit with a repeat CBC differential and C. met. She will continue on Xgeva injections every 4 weeks as scheduled.  Laural Benes, Tarvaris Puglia E, PA-C   All questions were answered. The patient knows to call the clinic with any problems, questions or concerns. We can certainly see the patient much sooner if necessary.  I spent 20 minutes counseling the patient face to face. The total time spent in the appointment was 30 minutes.

## 2012-08-15 ENCOUNTER — Ambulatory Visit: Payer: 59

## 2012-08-16 ENCOUNTER — Ambulatory Visit: Payer: 59

## 2012-08-19 ENCOUNTER — Ambulatory Visit: Payer: 59

## 2012-08-20 ENCOUNTER — Ambulatory Visit: Payer: 59

## 2012-08-20 NOTE — Progress Notes (Signed)
  Radiation Oncology         [phone] ________________________________  Name: Jacqueline Leonard MRN: [id number]  Date: 07/24/2012  DOB: Sep 20, 1946  End of Treatment Note  Diagnosis:   Metastatic lung cancer     Indication for treatment:  Palliative       Radiation treatment dates:   07/11/2012 through 07/24/2012  Site/dose:   The patient was treated to a metastasis within the left lateral rib. This was treated to a dose of 30 gray in 10 fractions at 3 gray per fraction. The patient began taking oral Tarceva during this treatment corresponding to concurrent chemotherapy.  Narrative: The patient tolerated radiation treatment relatively well.   The patient did not exhibit any significant acute toxicity during her course of treatment. Her skin did fine and she did not exhibit any worsening shortness of breath.  Plan: The patient has completed radiation treatment. The patient will return to radiation oncology clinic for routine followup in one month. I advised the patient to call or return sooner if they have any questions or concerns related to their recovery or treatment. ________________________________  Radene Gunning, M.D., Ph.D.

## 2012-08-20 NOTE — Addendum Note (Signed)
Encounter addended by: Jonna Coup, MD on: 08/20/2012  3:29 PM<BR>     Documentation filed: Notes Section

## 2012-08-21 ENCOUNTER — Ambulatory Visit: Payer: 59

## 2012-08-22 ENCOUNTER — Ambulatory Visit: Payer: 59

## 2012-08-23 ENCOUNTER — Encounter: Payer: Self-pay | Admitting: Radiation Oncology

## 2012-08-23 ENCOUNTER — Telehealth: Payer: Self-pay | Admitting: *Deleted

## 2012-08-23 ENCOUNTER — Ambulatory Visit
Admission: RE | Admit: 2012-08-23 | Discharge: 2012-08-23 | Disposition: A | Discharge status: Home / Self Care | Payer: 59 | Source: Ambulatory Visit | Attending: Radiation Oncology | Admitting: Radiation Oncology

## 2012-08-23 VITALS — BP 109/43 | HR 94 | Temp 98.5°F | Wt 173.8 lb

## 2012-08-23 DIAGNOSIS — C7952 Secondary malignant neoplasm of bone marrow: Secondary | ICD-10-CM

## 2012-08-23 NOTE — Progress Notes (Signed)
Here for routine follow completion of palliative radiation of left lateral rib mets on 07/24/12.Patient states she has been having increased fatigue and new onset on pain of bilateral knees, legs and back of head one week.Started tarceva on this Monday 08/19/12.Has facial rash.

## 2012-08-23 NOTE — Telephone Encounter (Signed)
Patient came up to Med Onc as walk-in from [facility], questioning if the pain in her knees and legs is from Tarceva. 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 Patient denies any pain meds at this time. Instructed patient she may take Tylenol if needed for pain. If no relief, she should see her primary MD for further evaluation because the Tarceva does not cause pain. Note that patient is also on Zocor and states that she had a med for cholesterol changed before due to pain and aches in her hands.  Instructed patient to call her prescribing primary and discuss if she may stop or hold this med temporarily to rule out being a contributing factor. She states she has had increase in headaches and a tender scalp at the occipital area, again, informed her to try Tylenol and call us back if no relief for further eval and/or possible radiology diagnostic testing.  Encouraged patient to be mindful of eating and drinking and make sure she is having proper intake. Patient and spouse agreed to plan. Informed patient I would notify Dr Arbutus Ped and staff of discussion in his absence since today is his regular day off.  If patient has any further questions or concerns she is to call.

## 2012-08-23 NOTE — Progress Notes (Signed)
  Radiation Oncology         [phone] ________________________________  Name: Jacqueline Leonard MRN: [id number]  Date: 08/23/2012  DOB: 1946-07-16  Follow-Up Visit Note  CC: [name], MD  Loreli Slot, *  Diagnosis:   Metastatic non-small cell lung cancer  Interval Since Last Radiation:  One month   Narrative:  The patient returns today for routine follow-up.  The patient received a palliative course of treatment to the left lateral rib which was completed on 07/24/2012. This was a fairly small area. The patient has not noticed any skin irritation/toxicity associated with this or other local problems. She indicates that her breathing has not worsened over the last several weeks. The patient is continuing on Tarceva and does have a rash associated with this. She states that she is also developed pain in her knees and leg as well as in her head which all came on at the same time one month ago.                              ALLERGIES:  is allergic to penicillins.  Meds: Current Outpatient Prescriptions  Medication Sig Dispense Refill  . amLODipine (NORVASC) 5 MG tablet Take 5 mg by mouth daily.      . calcium carbonate 200 MG capsule Take 250 mg by mouth 2 (two) times daily with a meal.      . Potassium 99 MG TABS Take 1 tablet by mouth daily.      . simvastatin (ZOCOR) 20 MG tablet Take 20 mg by mouth every evening.      . valsartan-hydrochlorothiazide (DIOVAN-HCT) 160-25 MG per tablet Take 1 tablet by mouth daily.      . cholecalciferol (VITAMIN D) 1000 UNITS tablet Take 1,000 Units by mouth daily.      . fish oil-omega-3 fatty acids 1000 MG capsule Take 1 g by mouth daily.      . Multiple Minerals-Vitamins (CALCIUM & VIT D3 BONE HEALTH PO) Take 750 mg by mouth 2 (two) times daily.        Physical Findings: The patient is in no acute distress. Patient is alert and oriented.  weight is 173 lb 12.8 oz (78.835 kg). Her temperature is 98.5 F (36.9 C). Her blood pressure  is 109/43 and her pulse is 94. Her oxygen saturation is 100%. .   General: Well-developed, in no acute distress HEENT: Normocephalic, atraumatic Cardiovascular: Regular rate and rhythm Respiratory: Clear to auscultation bilaterally GI: Soft, nontender, normal bowel sounds Extremities: No edema present   Lab Findings: Lab Results  Component Value Date   WBC 7.8 08/07/2012   HGB 11.9 08/07/2012   HCT 35.8 08/07/2012   MCV 86.7 08/07/2012   PLT 217 08/07/2012     Radiographic Findings: No results found.  Impression:    66 year old female with metastatic non-small cell lung cancer. She is status post palliative radiotherapy to a rib metastasis and is continuing on Tarceva.  Plan:  Four-month followup. She is going to discuss her diffuse pain with medical oncology to see if this is medicine related. This does not sound like progression of cancer given the symmetricaland multifocal nature of her complaint.   Radene Gunning, M.D., Ph.D.

## 2012-09-04 ENCOUNTER — Ambulatory Visit (HOSPITAL_BASED_OUTPATIENT_CLINIC_OR_DEPARTMENT_OTHER): Payer: 59

## 2012-09-04 ENCOUNTER — Other Ambulatory Visit (HOSPITAL_BASED_OUTPATIENT_CLINIC_OR_DEPARTMENT_OTHER): Payer: 59 | Admitting: Lab

## 2012-09-04 ENCOUNTER — Telehealth: Payer: Self-pay | Admitting: Internal Medicine

## 2012-09-04 ENCOUNTER — Ambulatory Visit (HOSPITAL_BASED_OUTPATIENT_CLINIC_OR_DEPARTMENT_OTHER): Payer: 59 | Admitting: Physician Assistant

## 2012-09-04 VITALS — BP 174/73 | HR 86 | Temp 96.9°F | Resp 20 | Ht 62.0 in | Wt 169.2 lb

## 2012-09-04 DIAGNOSIS — C7952 Secondary malignant neoplasm of bone marrow: Secondary | ICD-10-CM

## 2012-09-04 DIAGNOSIS — C3430 Malignant neoplasm of lower lobe, unspecified bronchus or lung: Secondary | ICD-10-CM

## 2012-09-04 DIAGNOSIS — C799 Secondary malignant neoplasm of unspecified site: Secondary | ICD-10-CM

## 2012-09-04 DIAGNOSIS — C7951 Secondary malignant neoplasm of bone: Secondary | ICD-10-CM

## 2012-09-04 LAB — CBC WITH DIFFERENTIAL/PLATELET
Basophils Absolute: 0 10*3/uL (ref 0.0–0.1)
Eosinophils Absolute: 0.1 10*3/uL (ref 0.0–0.5)
HCT: 32.4 % — ABNORMAL LOW (ref 34.8–46.6)
HGB: 10.7 g/dL — ABNORMAL LOW (ref 11.6–15.9)
LYMPH%: 16.7 % (ref 14.0–49.7)
MONO#: 0.7 10*3/uL (ref 0.1–0.9)
NEUT%: 73.8 % (ref 38.4–76.8)
Platelets: 357 10*3/uL (ref 145–400)
WBC: 7.9 10*3/uL (ref 3.9–10.3)
lymph#: 1.3 10*3/uL (ref 0.9–3.3)

## 2012-09-04 LAB — COMPREHENSIVE METABOLIC PANEL (CC13)
ALT: 43 U/L (ref 0–55)
BUN: 22 mg/dL (ref 7.0–26.0)
CO2: 24 mEq/L (ref 22–29)
Calcium: 8.7 mg/dL (ref 8.4–10.4)
Chloride: 104 mEq/L (ref 98–107)
Creatinine: 0.9 mg/dL (ref 0.6–1.1)
Glucose: 92 mg/dl (ref 70–99)

## 2012-09-04 MED ORDER — DENOSUMAB 120 MG/1.7ML ~~LOC~~ SOLN
120.0000 mg | Freq: Once | SUBCUTANEOUS | Status: AC
Start: 2012-09-04 — End: 2012-09-04
  Administered 2012-09-04: 120 mg via SUBCUTANEOUS
  Filled 2012-09-04: qty 1.7

## 2012-09-04 NOTE — Patient Instructions (Addendum)
Continue taking Tarceva 150 mg by mouth daily Follow up with Dr. Arbutus Ped in one month with a restaging CT scan of your chest, abdomen and pelvis to re-evaluate your disease You will continue to receive Xgeva injections on a monthly basis Call your primary care doctor about your elevated blood pressure readings

## 2012-09-04 NOTE — Telephone Encounter (Signed)
gv and printed appt schedule for NOV °

## 2012-09-10 ENCOUNTER — Telehealth: Payer: Self-pay | Admitting: *Deleted

## 2012-09-10 MED ORDER — METHYLPREDNISOLONE 4 MG PO KIT: PACK | ORAL | Status: DC

## 2012-09-10 NOTE — Telephone Encounter (Signed)
Pt called c/o no appetite.  Per Tiana Loft, PA-C, okay to give medrol dose pack.  Pt also asked if okay to eat hot sauce.  Per Dr Donnald Garre, pt should not use hot sauce.  SLJ

## 2012-09-11 NOTE — Progress Notes (Signed)
[facility] [phone]   [phone]  OFFICE PROGRESS NOTE  Jacqueline Islam, MD [address] [name] [address]  DIAGNOSIS: Metastatic non-small cell lung cancer, adenocarcinoma with positive EGFR mutation in exon 19 and negative ALK gene translocation diagnosed in July 2013 with bone metastasis.  PRIOR THERAPY: Status post radiotherapy to the left face rib metastasis under the care of Dr. Mitzi Hansen.  CURRENT THERAPY:  1)  treatment with Tarceva 150 mg by mouth daily, therapy beginning 07/20/2012. Status post approximately 2 months of therapy 2)  Xgeva 120 mg subcutaneously every 4 weeks for bone disease.   INTERVAL HISTORY: Jacqueline Leonard 66 y.o. female returns to the clinic today for another symptom management visit. She reports being ill last week. She had pain in her legs and in the back of her head. Her blood pressure was low and her blood pressure medications were adjusted by Dr. Andi Devon. Since the illness she has not been eating well as she states that food does not taste good. She is tolerating the Tarceva relatively well except for a few episodes of diarrhea and increased rash on her face and neck.  She denied having any significant chest pain, shortness breath, cough or hemoptysis. She denied having any significant weight loss or night sweats. She tolerated her first injection of Xgeva without difficulty, but did not receive her injection last month.  MEDICAL HISTORY: Past Medical History  Diagnosis Date  . Lung mass   . Hypertension   . Hyperlipidemia   . Vitamin D deficiency   . Cancer, metastatic to bone 06/19/12    bx=L 5th ribmetastatic Adenocarcinoma with known lung mass  . Radiation 07/11/12-07/24/12    Palliative lung tx 30 gray in 10 fx  . Status post chemoradiation     Tarceva    ALLERGIES:  is allergic to penicillins.  MEDICATIONS:  Current Outpatient Prescriptions  Medication Sig Dispense Refill  . amLODipine  (NORVASC) 5 MG tablet Take 5 mg by mouth daily.      . calcium carbonate 200 MG capsule Take 250 mg by mouth 2 (two) times daily with a meal.      . cholecalciferol (VITAMIN D) 1000 UNITS tablet Take 1,000 Units by mouth daily.      . fish oil-omega-3 fatty acids 1000 MG capsule Take 1 g by mouth daily.      Marland Kitchen HYDROcodone-acetaminophen (NORCO/VICODIN) 5-325 MG per tablet       . Multiple Minerals-Vitamins (CALCIUM & VIT D3 BONE HEALTH PO) Take 750 mg by mouth 2 (two) times daily.      . Potassium 99 MG TABS Take 1 tablet by mouth daily.      . predniSONE (DELTASONE) 10 MG tablet       . simvastatin (ZOCOR) 20 MG tablet Take 20 mg by mouth every evening.      Marland Kitchen TARCEVA 150 MG tablet       . valsartan-hydrochlorothiazide (DIOVAN-HCT) 160-25 MG per tablet Take 1 tablet by mouth daily.      . cyclobenzaprine (FLEXERIL) 10 MG tablet       . meloxicam (MOBIC) 7.5 MG tablet       . methylPREDNISolone (MEDROL, PAK,) 4 MG tablet follow package directions  21 tablet  0    SURGICAL HISTORY:  Past Surgical History  Procedure Date  . Soft tissue biopsy 06/19/12    L 5th rib=metastatic adenocarcinoma    REVIEW OF SYSTEMS:  Pertinent items are noted  in HPI.   PHYSICAL EXAMINATION: General appearance: alert, cooperative and no distress Head: Normocephalic, without obvious abnormality, atraumatic Neck: no adenopathy Lymph nodes: Cervical, supraclavicular, and axillary nodes normal. Resp: clear to auscultation bilaterally Back: symmetric, no curvature. ROM normal. No CVA tenderness. Cardio: regular rate and rhythm, S1, S2 normal, no murmur, click, rub or gallop GI: soft, non-tender; bowel sounds normal; no masses,  no organomegaly Extremities: extremities normal, atraumatic, no cyanosis or edema Neurologic: Alert and oriented X 3, normal strength and tone. Normal symmetric reflexes. Normal coordination and gait Skin: Reveals erythematous acneiform eruptions scattered over the face anterior neck upper  anterior chest , no evidence of superinfection  ECOG PERFORMANCE STATUS: 1 - Symptomatic but completely ambulatory  Blood pressure 174/73, pulse 86, temperature 96.9 F (36.1 C), temperature source Oral, resp. rate 20, height 5\' 2"  (1.575 m), weight 169 lb 3.2 oz (76.749 kg).  LABORATORY DATA: Lab Results  Component Value Date   WBC 7.9 09/04/2012   HGB 10.7* 09/04/2012   HCT 32.4* 09/04/2012   MCV 87.0 09/04/2012   PLT 357 09/04/2012      Chemistry      Component Value Date/Time   NA 137 09/04/2012 1459   NA 138 07/04/2012 1528   K 3.2* 09/04/2012 1459   K 3.9 07/04/2012 1528   CL 104 09/04/2012 1459   CL 105 07/04/2012 1528   CO2 24 09/04/2012 1459   CO2 29 07/04/2012 1249   BUN 22.0 09/04/2012 1459   BUN 23 07/04/2012 1528   CREATININE 0.9 09/04/2012 1459   CREATININE 0.70 07/04/2012 1528      Component Value Date/Time   CALCIUM 8.7 09/04/2012 1459   CALCIUM 9.3 07/04/2012 1249   ALKPHOS 47 09/04/2012 1459   ALKPHOS 75 07/04/2012 1249   AST 16 09/04/2012 1459   AST 17 07/04/2012 1249   ALT 43 09/04/2012 1459   ALT 21 07/04/2012 1249   BILITOT 0.90 09/04/2012 1459   BILITOT 0.6 07/04/2012 1249       RADIOGRAPHIC STUDIES: Dg Chest 1 View  06/19/2012  *RADIOLOGY REPORT*  Clinical Data: Post biopsy in lateral left chest wall lesion question pneumothorax  CHEST - 1 VIEW  Comparison: None  Findings: Enlargement of cardiac silhouette. Mediastinal contours and pulmonary vascularity normal. Minimal peribronchial thickening. No acute infiltrate, pleural effusion, or pneumothorax. Destructive rib lesion identified at lateral aspect of the left fourth rib with associated extrapleural density. No additional osseous abnormalities seen.  IMPRESSION: No pneumothorax. Destructive left lateral fourth rib lesion. Minimal enlargement of cardiac silhouette. Minimal bronchitic changes.  Original Report Authenticated By: Lollie Marrow, M.D.   Mr [name] Wo Contrast  07/04/2012  *RADIOLOGY REPORT*  Clinical Data:  66 year old female with new diagnosis of lung cancer.  Agitation.  MRI HEAD WITHOUT AND WITH CONTRAST  Technique:  Multiplanar, multiecho pulse sequences of the brain and surrounding structures were obtained according to standard protocol without and with intravenous contrast  Contrast: 17mL MULTIHANCE GADOBENATE DIMEGLUMINE 529 MG/ML IV SOLN  Comparison: None.  Findings: There is a patchy somewhat wedge-shaped area of enhancement in the left superior occipital lobe near the parieto- occipital sulcus.  This is partially gyriform on coronal images (series 14 image 4).  There is associated T2* signal loss suggesting blood products (series 10 image 14).  There is only subtle T2 hyperintense signal correlation and no surrounding edema, no mass effect.  No additional enhancing lesion identified.  There is evidence of a chronic linear lacunar infarct in  the right posterior corona radiata (series 9 image 17).  No restricted diffusion to suggest acute infarction.  No ventriculomegaly.  No acute intracranial hemorrhage.  No midline shift or mass effect.  Aside from the above, gray and white matter signal are within normal limits.  Negative visualized cervical spine.  Negative pituitary and cervicomedullary junction.  Bone marrow signal within normal limits.  Mild hyperostosis frontalis.  Negative scalp soft tissues. Visualized orbit soft tissues are within normal limits.  Minor paranasal sinus mucosal thickening.  IMPRESSION: 1.  Solitary somewhat gyriform area of enhancement in the superior left occipital lobe near the parieto-occipital sulcus. Associated chronic blood products. This either represents early metastatic disease to the brain, or a small late subacute to early chronic infarct with post ischemic enhancement.  Repeat study in 6 to 8 weeks recommended. 2.  Mild chronic small vessel ischemia in the right corona radiata.  Original Report Authenticated By: Harley Hallmark, M.D.   Nm Pet Image Initial (pi) Skull Base  To Thigh  06/13/2012  *RADIOLOGY REPORT*  Clinical Data: Initial treatment strategy for lung mass.  NUCLEAR MEDICINE PET SKULL BASE TO THIGH  Fasting Blood Glucose:  94  Technique:  19.1 mCi F-18 FDG was injected intravenously. CT data was obtained and used for attenuation correction and anatomic localization only.  (This was not acquired as a diagnostic CT examination.) Additional exam technical data entered on technologist worksheet.  Comparison:  None  Findings:  Neck:   Small hypermetabolic right supraclavicular node measures 10 mm (image 50) with SUV max = 7.2.  Adjacent 8 mm nodal metastasis on image 48.  Chest:  Within the medial aspect of the left lower lobe there is an intensely hypermetabolic mass measuring 3.2 x 4.3 cm (image 8) with SUV max = 25.3.  Metastatic nodal disease to the subcarinal location with an intense hypermetabolic subcarinal node measuring 13 mm short axis and SUV max = 9.3.  Hypermetabolic right lower paratracheal lymph node measuring 7 mm short axis with SUV max = 6.4).  Hypermetabolic chest wall metastasis expanding the left lateral fifth rib (image 69) with SUV max = 24.4).  Abdomen/Pelvis:  No abnormal hypermetabolic activity within the liver, pancreas, adrenal glands, or spleen.  No hypermetabolic lymph nodes in the abdomen or pelvis.  The fibroid uterus with central calcification noted.  [place]:  In addition to the left chest wall metastasis involving the fifth rib, there is a skeletal metastasis to the T12 vertebral body with SUV max = 13.9.  Additionally, at the T12 level there are paraspinal soft tissue metastasis whichare  difficult to define on the CT but are hypermetabolic (image 117 and 122).  There is subtle soft tissue thickening at this level.  There is anterolisthesis of L5 on S1 related to bilateral pars defects.  IMPRESSION:  1.  Intensely  hypermetabolic left lower lobe mass consistent with primary bronchogenic carcinoma. 2.  Mediastinal metastasis as well as  contralateral right supraclavicular nodal metastasis. 3.  Hypermetabolic skeletal metastasis include the left lateral fifth rib and T12 vertebral body.  5.  Paraspinal soft tissue metastasis adjacent to the T12 vertebral body.  Original Report Authenticated By: Genevive Bi, M.D.   Ct Biopsy  06/19/2012  *RADIOLOGY REPORT*  Indication: Presumed metastatic lung cancer with hypermetabolic left 4th rib mass  CT GUIDED FINE NEEDLE ASPIRATION AND CORE NEEDLE BIOPSY OF LEFT 4th RIB MASS  Comparisons: PET CT - 06/13/2012  Medications: Fentanyl 50 mcg IV; Versed 1 mg IV  Contrast:  None  Sedation time: 33 minutes  Complications: None immediate  TECHNIQUE/FINDINGS:  Informed consent was obtained from the patient following an explanation of the procedure, risks, benefits and alternatives.  A time out was performed prior to the initiation of the procedure.  The patient was positioned supine on the CT table and a limited CT was performed for procedural planning demonstrating the expansile lesion measuring approximately 1.8 x 2.9 cm involving the lateral aspect of the left fourth rib.  The procedure was planned.  The operative site was prepped and draped in the usual sterile fashion. Appropriate trajectory was confirmed with a 22 gauge spinal needle after the adjacent tissues were anesthetized with 1% Lidocaine with epinephrine.   Under intermittent CT guidance, a 17 gauge coaxial needle was advanced into the peripheral aspect of the mass. Positioning was confirmed and 2 FNA samples were obtained with the use of a 20 gauge Francine needle.  Quick stain pathologic review confirmed the acquisition of lesional tissue.  Appropriate positioning was again confirmed and 3 additional samples were obtained with an 18 gauge core biopsy device.  The co-axial needle was removed and hemostasis was achieved with manual compression.  A limited post procedural CT was negative for hemorrhage or additional complication.  A dressing was  placed.  The patient tolerated the procedure well without immediate postprocedural complication.  IMPRESSION:  Technically successful CT guided fine needle aspiration and core needle biopsy of expansile lesion involving the lateral aspect of the left fourth rib.  Original Report Authenticated By: Waynard Reeds, M.D.    ASSESSMENT/PLAN: This is a very pleasant 67 years old Hispanic female recently diagnosed with metastatic non-small cell lung cancer, adenocarcinoma with positive EGFR mutation in exon 19. She is currently on Tarceva at 150 mg by mouth daily status post approximately 2 months of therapy. Patient was discussed with Dr. Arbutus Ped. She will continue on the Tarceva at the current dose. She will follow up with Dr. Arbutus Ped in one month for another symptom management visit with a repeat CBC differential and C. Met, as well as a restaging CT scan of the chest, abdomen and pelvis with contrast to re-evaluate her disease. She is encouraged to follow up with Dr. Andi Devon regarding her blood pressure medications.. She will continue on Xgeva injections every 4 weeks as scheduled, resuming today.  Laural Benes, Daphyne Miguez E, PA-C   All questions were answered. The patient knows to call the clinic with any problems, questions or concerns. We can certainly see the patient much sooner if necessary.  I spent 20 minutes counseling the patient face to face. The total time spent in the appointment was 30 minutes.

## 2012-09-18 ENCOUNTER — Telehealth: Payer: Self-pay | Admitting: *Deleted

## 2012-09-18 NOTE — Telephone Encounter (Signed)
Family member Britta Mccreedy called from [place] wanting to speak to RN regarding pt.  She stated "pt has said its okay for me to get information from you".  Informed Britta Mccreedy that she is not in the HIPPA form and we cannot give her information w/o pt calling and verbally informing us that we can speak to you.  She verbalized understanding.  SLJ

## 2012-09-19 ENCOUNTER — Telehealth: Payer: Self-pay | Admitting: Medical Oncology

## 2012-09-19 DIAGNOSIS — B370 Candidal stomatitis: Secondary | ICD-10-CM

## 2012-09-19 MED ORDER — FLUCONAZOLE 100 MG PO TABS: 100.0000 mg | ORAL_TABLET | Freq: Every day | ORAL | Status: DC

## 2012-09-19 NOTE — Telephone Encounter (Signed)
Pt called to tell me her niece , Tacey Heap can have access to her Health care information. I routed this to Med records

## 2012-09-19 NOTE — Telephone Encounter (Addendum)
Pt reports she feels worse after medrol dose pak prescribed for appetite.  She stopped taking the medrol dose pak 4 days ago because symptoms got worse. She has no taste , she gets nauseated and if she drinks more than sips of water she vomits..   She does report small white patches on tongue. Per Adrena i called in diflucan and niece notified. I called Britta Mccreedy and told her to tell pt no to take zocor while she is taking diflucan. She voices understanding.

## 2012-09-20 ENCOUNTER — Other Ambulatory Visit: Payer: Self-pay | Admitting: *Deleted

## 2012-09-20 NOTE — Telephone Encounter (Signed)
THIS REFILL REQUEST FOR TARCEVA WAS GIVEN TO DR.MOHAMED'S NURSE, STEPHANIE JOHNSON,RN. 

## 2012-09-21 ENCOUNTER — Other Ambulatory Visit: Payer: Self-pay | Admitting: Oncology

## 2012-09-23 ENCOUNTER — Encounter (HOSPITAL_COMMUNITY): Payer: Self-pay | Admitting: *Deleted

## 2012-09-23 ENCOUNTER — Emergency Department (HOSPITAL_COMMUNITY): Payer: 59

## 2012-09-23 ENCOUNTER — Other Ambulatory Visit: Payer: Self-pay | Admitting: *Deleted

## 2012-09-23 ENCOUNTER — Emergency Department (HOSPITAL_COMMUNITY)
Admission: EM | Admit: 2012-09-23 | Discharge: 2012-09-23 | Disposition: A | Discharge status: Home / Self Care | Payer: 59 | Attending: Emergency Medicine | Admitting: Emergency Medicine

## 2012-09-23 ENCOUNTER — Telehealth: Payer: Self-pay | Admitting: Medical Oncology

## 2012-09-23 ENCOUNTER — Telehealth: Payer: Self-pay | Admitting: Internal Medicine

## 2012-09-23 DIAGNOSIS — E785 Hyperlipidemia, unspecified: Secondary | ICD-10-CM | POA: Insufficient documentation

## 2012-09-23 DIAGNOSIS — R109 Unspecified abdominal pain: Secondary | ICD-10-CM | POA: Insufficient documentation

## 2012-09-23 DIAGNOSIS — Z9889 Other specified postprocedural states: Secondary | ICD-10-CM | POA: Insufficient documentation

## 2012-09-23 DIAGNOSIS — Z79899 Other long term (current) drug therapy: Secondary | ICD-10-CM | POA: Insufficient documentation

## 2012-09-23 DIAGNOSIS — E559 Vitamin D deficiency, unspecified: Secondary | ICD-10-CM | POA: Insufficient documentation

## 2012-09-23 DIAGNOSIS — R630 Anorexia: Secondary | ICD-10-CM | POA: Insufficient documentation

## 2012-09-23 DIAGNOSIS — R222 Localized swelling, mass and lump, trunk: Secondary | ICD-10-CM | POA: Insufficient documentation

## 2012-09-23 DIAGNOSIS — R1012 Left upper quadrant pain: Secondary | ICD-10-CM | POA: Insufficient documentation

## 2012-09-23 DIAGNOSIS — E860 Dehydration: Secondary | ICD-10-CM

## 2012-09-23 DIAGNOSIS — C7800 Secondary malignant neoplasm of unspecified lung: Secondary | ICD-10-CM | POA: Insufficient documentation

## 2012-09-23 DIAGNOSIS — I10 Essential (primary) hypertension: Secondary | ICD-10-CM | POA: Insufficient documentation

## 2012-09-23 DIAGNOSIS — C413 Malignant neoplasm of ribs, sternum and clavicle: Secondary | ICD-10-CM | POA: Insufficient documentation

## 2012-09-23 LAB — COMPREHENSIVE METABOLIC PANEL
ALT: 31 U/L (ref 0–35)
AST: 24 U/L (ref 0–37)
Alkaline Phosphatase: 52 U/L (ref 39–117)
CO2: 26 mEq/L (ref 19–32)
Calcium: 8.9 mg/dL (ref 8.4–10.5)
Chloride: 101 mEq/L (ref 96–112)
GFR calc non Af Amer: 70 mL/min — ABNORMAL LOW (ref >90)
Potassium: 4 mEq/L (ref 3.5–5.1)
Sodium: 136 mEq/L (ref 135–145)

## 2012-09-23 LAB — CBC WITH DIFFERENTIAL/PLATELET
Basophils Relative: 0 % (ref 0–1)
Eosinophils Absolute: 0.1 10*3/uL (ref 0.0–0.7)
Hemoglobin: 10.9 g/dL — ABNORMAL LOW (ref 12.0–15.0)
MCH: 28.7 pg (ref 26.0–34.0)
MCHC: 34 g/dL (ref 30.0–36.0)
Monocytes Absolute: 0.6 10*3/uL (ref 0.1–1.0)
Monocytes Relative: 7 % (ref 3–12)
Neutrophils Relative %: 80 % — ABNORMAL HIGH (ref 43–77)

## 2012-09-23 LAB — URINALYSIS, ROUTINE W REFLEX MICROSCOPIC
Hgb urine dipstick: NEGATIVE
Protein, ur: NEGATIVE mg/dL
Urobilinogen, UA: 4 mg/dL — ABNORMAL HIGH (ref 0.0–1.0)

## 2012-09-23 MED ORDER — SODIUM CHLORIDE 0.9 % IV BOLUS (SEPSIS)
1000.0000 mL | Freq: Once | INTRAVENOUS | Status: AC
  Administered 2012-09-23: 1000 mL via INTRAVENOUS

## 2012-09-23 NOTE — ED Notes (Signed)
Pt reports for last two weeks, she has been very weak, no appetite, food tastes very bad, n/v (dry heaves), intermittent abd pain. [facility]. On oral chemo and last had radiation 2 months ago.

## 2012-09-23 NOTE — Telephone Encounter (Signed)
s/w ? spouse ande he understands appt for 11/25.  also made aware per previous message to take her to the er today      aom

## 2012-09-23 NOTE — Telephone Encounter (Signed)
Patient in ER for further evaluation of dehydration. Will wait for status update before refilling medication of Tarceva 150mg . Per Dr Arbutus Ped, if patient is having diarrhea, may need to reduce dose of drug. Will follow-up 09/23/12.

## 2012-09-23 NOTE — ED Notes (Signed)
Pt is aware of the need for urine sample however states she is unable at this time.

## 2012-09-23 NOTE — Telephone Encounter (Signed)
Per Britta Mccreedy , niece, pt thrush is better and pt is eating some , however she is not drinking fluids. Britta Mccreedy stated pt did not tell her for 3 weeks that she was unable to eat before she was treated for thrush so Finley feels very weak  wants to come in for  IVF .  I instructed Britta Mccreedy to tell pt to go to [facility] to be evaluated.

## 2012-09-23 NOTE — ED Provider Notes (Signed)
History     CSN: [id number]  Arrival date & time 09/23/12  1335   First MD Initiated Contact with Patient 09/23/12 1552      Chief Complaint  Patient presents with  . Weakness    (Consider location/radiation/quality/duration/timing/severity/associated sxs/prior treatment) HPI Pt has had several weeks of generalized weakness, decreased PO's and intermittent LUQ pain. Currently pain free. No fever chills. 1 episode of diarrhea recently. Pt with known lung cancer with mets. Followed by Dr Jerolyn Center. Recently treated for thrush after being on medrol dose pack for appetite. Past Medical History  Diagnosis Date  . Lung mass   . Hypertension   . Hyperlipidemia   . Vitamin D deficiency   . Cancer, metastatic to bone 06/19/12    bx=L 5th ribmetastatic Adenocarcinoma with known lung mass  . Radiation 07/11/12-07/24/12    Palliative lung tx 30 gray in 10 fx  . Status post chemoradiation     Tarceva    Past Surgical History  Procedure Date  . Soft tissue biopsy 06/19/12    L 5th rib=metastatic adenocarcinoma    No family history on file.  History  Substance Use Topics  . Smoking status: Never Smoker   . Smokeless tobacco: Never Used  . Alcohol Use: No    OB History    Grav Para Term Preterm Abortions TAB SAB Ect Mult Living                  Review of Systems  Constitutional: Positive for appetite change and fatigue. Negative for fever and chills.  HENT: Negative for neck pain and neck stiffness.   Respiratory: Negative for shortness of breath and wheezing.   Cardiovascular: Negative for chest pain.  Gastrointestinal: Positive for abdominal pain. Negative for nausea, vomiting, diarrhea and constipation.  Musculoskeletal: Negative for myalgias and back pain.  Skin: Negative for rash and wound.  Neurological: Negative for weakness, light-headedness, numbness and headaches.    Allergies  Penicillins  Home Medications   Current Outpatient Rx  Name Route Sig Dispense  Refill  . AMLODIPINE BESYLATE 5 MG PO TABS Oral Take 5 mg by mouth daily.    Marland Kitchen VITAMIN D 1000 UNITS PO TABS Oral Take 1,000 Units by mouth daily.    Marland Kitchen FLUCONAZOLE 100 MG PO TABS Oral Take 1 tablet (100 mg total) by mouth daily. 16 tablet 0    Take 2 tablets day 1 , then one tablet daily until ...  . CALCIUM & VIT D3 BONE HEALTH PO Oral Take 750 mg by mouth 2 (two) times daily.    Marland Kitchen TARCEVA 150 MG PO TABS Oral Take 150 mg by mouth daily.     Marland Kitchen VALSARTAN-HYDROCHLOROTHIAZIDE 160-25 MG PO TABS Oral Take 1 tablet by mouth daily.    . OMEGA-3 FATTY ACIDS 1000 MG PO CAPS Oral Take 1 g by mouth daily.      BP 137/46  Pulse 76  Temp 97.8 F (36.6 C) (Oral)  Resp 17  SpO2 98%  Physical Exam  Nursing note and vitals reviewed. Constitutional: She is oriented to person, place, and time. She appears well-developed and well-nourished. No distress.  HENT:  Head: Normocephalic and atraumatic.  Mouth/Throat: Oropharynx is clear and moist. No oropharyngeal exudate.  Eyes: EOM are normal. Pupils are equal, round, and reactive to light.  Neck: Normal range of motion. Neck supple.  Cardiovascular: Normal rate and regular rhythm.   Pulmonary/Chest: Effort normal and breath sounds normal. No respiratory distress. She has no wheezes.  She has no rales. She exhibits no tenderness.  Abdominal: Soft. Bowel sounds are normal. She exhibits no distension and no mass. There is no tenderness. There is no rebound and no guarding.  Musculoskeletal: Normal range of motion. She exhibits no edema and no tenderness.       No lower ext swelling or tenderness  Neurological: She is alert and oriented to person, place, and time.  Skin: Skin is warm and dry. No rash noted. No erythema.  Psychiatric: She has a normal mood and affect. Her behavior is normal.    ED Course  Procedures (including critical care time)  Labs Reviewed  COMPREHENSIVE METABOLIC PANEL - Abnormal; Notable for the following:    BUN 30 (*)      Albumin 2.9 (*)     GFR calc non Af Amer 70 (*)     GFR calc Af Amer 82 (*)     All other components within normal limits  CBC WITH DIFFERENTIAL - Abnormal; Notable for the following:    RBC 3.80 (*)     Hemoglobin 10.9 (*)     HCT 32.1 (*)     Neutrophils Relative 80 (*)     All other components within normal limits  URINALYSIS, ROUTINE W REFLEX MICROSCOPIC - Abnormal; Notable for the following:    Color, Urine AMBER (*)  BIOCHEMICALS MAY BE AFFECTED BY COLOR   APPearance CLOUDY (*)     Bilirubin Urine SMALL (*)     Ketones, ur >80 (*)     Urobilinogen, UA 4.0 (*)     Leukocytes, UA TRACE (*)     All other components within normal limits  URINE MICROSCOPIC-ADD ON   Dg Abd Acute W/chest  09/23/2012  *RADIOLOGY REPORT*  Clinical Data: Abdominal pain, nausea and vomiting.  History of metastatic adenocarcinoma of the lung with treated left rib metastasis.  ACUTE ABDOMEN SERIES (ABDOMEN 2 VIEW & CHEST 1 VIEW)  Comparison: Chest x-ray dated 06/19/2012 and prior PET scan on 06/13/2012.  Findings: In the chest, there is interval appearance likely related to treatment effect at the level of the left fifth rib metastasis. Adjacent parenchymal abnormality in the lung likely also relates to radiation therapy.  No evidence of edema or pulmonary consolidation.  Abdominal films show no evidence of acute bowel obstruction or significant ileus.  There is moderate fecal material in the region of the rectum.  No free air, abnormal calcifications or significant bony abnormalities in the abdomen or pelvis.  IMPRESSION: No acute abnormalities.  Changes in the chest likely relate to interval radiation therapy of the known left fifth rib metastasis.   Original Report Authenticated By: Reola Calkins, M.D.      1. Dehydration      Date: 09/23/2012  Rate: 79  Rhythm: normal sinus rhythm  QRS Axis: normal  Intervals: normal  ST/T Wave abnormalities: normal  Conduction Disutrbances:none  Narrative  Interpretation:   Old EKG Reviewed: none available    MDM   Pt tolerating PO's. Encouraged to take Ensure and f/u with PMD.        Loren Racer, MD 09/23/12 2228

## 2012-09-23 NOTE — ED Notes (Signed)
Patient c/o feeling weak x 2 weeks and unable to take oral fluids or food due to effects of oral chemo.

## 2012-09-24 ENCOUNTER — Other Ambulatory Visit: Payer: Self-pay | Admitting: *Deleted

## 2012-09-24 ENCOUNTER — Telehealth: Payer: Self-pay | Admitting: *Deleted

## 2012-09-24 DIAGNOSIS — C3490 Malignant neoplasm of unspecified part of unspecified bronchus or lung: Secondary | ICD-10-CM

## 2012-09-24 MED ORDER — ERLOTINIB HCL 150 MG PO TABS: 150.0000 mg | ORAL_TABLET | Freq: Every day | ORAL | Status: DC

## 2012-09-24 NOTE — Addendum Note (Signed)
Addended by: Caren Griffins on: 09/24/2012 11:20 AM   Modules accepted: Orders

## 2012-09-24 NOTE — Telephone Encounter (Signed)
Called patient to f/u on status.  She was discharged home from ED after receiving IVF.  Patient reports she is having nausea but no emesis.  Denies diarrhea  And says she feels better today.  Says she has twenty of the Tarceva 150 mg pills left.  Will notify providers.

## 2012-09-24 NOTE — Telephone Encounter (Signed)
Refill request for Tarceva given to Dr Donnald Garre to review, Per Dr Donnald Garre, will not refill Tarceva at this time, will see pt at f/u visit, then refill if appropriate.  SLJ

## 2012-09-27 ENCOUNTER — Ambulatory Visit (HOSPITAL_COMMUNITY)
Admission: RE | Admit: 2012-09-27 | Discharge: 2012-09-27 | Disposition: A | Discharge status: Home / Self Care | Payer: 59 | Source: Ambulatory Visit | Attending: Physician Assistant | Admitting: Physician Assistant

## 2012-09-27 ENCOUNTER — Other Ambulatory Visit (HOSPITAL_BASED_OUTPATIENT_CLINIC_OR_DEPARTMENT_OTHER): Payer: 59 | Admitting: Lab

## 2012-09-27 ENCOUNTER — Telehealth: Payer: Self-pay | Admitting: Medical Oncology

## 2012-09-27 ENCOUNTER — Ambulatory Visit: Payer: 59

## 2012-09-27 DIAGNOSIS — C799 Secondary malignant neoplasm of unspecified site: Secondary | ICD-10-CM

## 2012-09-27 DIAGNOSIS — Z1289 Encounter for screening for malignant neoplasm of other sites: Secondary | ICD-10-CM | POA: Insufficient documentation

## 2012-09-27 DIAGNOSIS — Z79899 Other long term (current) drug therapy: Secondary | ICD-10-CM | POA: Insufficient documentation

## 2012-09-27 DIAGNOSIS — C3490 Malignant neoplasm of unspecified part of unspecified bronchus or lung: Secondary | ICD-10-CM | POA: Insufficient documentation

## 2012-09-27 DIAGNOSIS — C801 Malignant (primary) neoplasm, unspecified: Secondary | ICD-10-CM

## 2012-09-27 LAB — COMPREHENSIVE METABOLIC PANEL (CC13)
ALT: 33 U/L (ref 0–55)
Albumin: 3 g/dL — ABNORMAL LOW (ref 3.5–5.0)
BUN: 13 mg/dL (ref 7.0–26.0)
CO2: 27 mEq/L (ref 22–29)
Calcium: 9.6 mg/dL (ref 8.4–10.4)
Chloride: 109 mEq/L — ABNORMAL HIGH (ref 98–107)
Creatinine: 0.7 mg/dL (ref 0.6–1.1)

## 2012-09-27 LAB — CBC WITH DIFFERENTIAL/PLATELET
BASO%: 0.3 % (ref 0.0–2.0)
Basophils Absolute: 0 10*3/uL (ref 0.0–0.1)
HCT: 31.8 % — ABNORMAL LOW (ref 34.8–46.6)
HGB: 10.7 g/dL — ABNORMAL LOW (ref 11.6–15.9)
MONO#: 0.3 10*3/uL (ref 0.1–0.9)
NEUT%: 82.3 % — ABNORMAL HIGH (ref 38.4–76.8)
WBC: 6.9 10*3/uL (ref 3.9–10.3)
lymph#: 0.8 10*3/uL — ABNORMAL LOW (ref 0.9–3.3)

## 2012-09-27 MED ORDER — IOHEXOL 300 MG/ML  SOLN
100.0000 mL | Freq: Once | INTRAMUSCULAR | Status: AC | PRN
  Administered 2012-09-27: 100 mL via INTRAVENOUS

## 2012-09-27 NOTE — Telephone Encounter (Signed)
Confirmed appts with pt

## 2012-09-30 ENCOUNTER — Telehealth: Payer: Self-pay | Admitting: Medical Oncology

## 2012-10-01 ENCOUNTER — Telehealth: Payer: Self-pay | Admitting: Medical Oncology

## 2012-10-01 ENCOUNTER — Ambulatory Visit: Payer: 59 | Admitting: Nutrition

## 2012-10-01 NOTE — Telephone Encounter (Signed)
Please call [facility] re tarceva . Use ID number [id number]. I will follow up in am after Dr Arbutus Ped sees pt.

## 2012-10-01 NOTE — Progress Notes (Signed)
This patient is a 66 year old female diagnosed with metastatic non-small cell lung cancer.  Past medical history includes hypertension, hyperlipidemia, and vitamin D deficiency.  Medications include Tarceva, Zantac, Xgeva.  Labs include glucose of 106.  Height: 62 inches. Weight: 169.2 pounds. Usual body weight: 182 pounds. BMI: 30.94.  I met with patient, family member and interpreter today at the request of Kathrin Penner, our social worker. Patient reports poor appetite. She has an aversion to very sweet foods. She has experienced nausea and vomiting and reports medication has not helped. She also reports food tastes differently to her contributing to poor oral intake.  Nutrition diagnosis: Unintended weight loss related to diagnosis of metastatic cancer and associated treatments as evidenced by 7% weight loss from usual body weight.  Intervention: I educated patient and friend on strategies for increasing appetite utilizing small meals with higher calorie, higher protein foods. I've given her some strategies for dealing with taste alterations. I've encouraged her to discuss medications with her physician. I've recommended she add an oral nutrition supplements twice a day. I provided her with samples of these products to take with her today along with fact sheets.  Monitoring, evaluation, goals: Patient will increase oral intake to 5-6 meals daily including oral nutrition supplements twice a day. She will minimize further weight loss for increased quality-of-life.  Next visit: Patient has my contact information for questions or concerns.

## 2012-10-02 ENCOUNTER — Ambulatory Visit (HOSPITAL_BASED_OUTPATIENT_CLINIC_OR_DEPARTMENT_OTHER): Payer: 59 | Admitting: Internal Medicine

## 2012-10-02 ENCOUNTER — Telehealth: Payer: Self-pay | Admitting: Internal Medicine

## 2012-10-02 ENCOUNTER — Encounter: Payer: Self-pay | Admitting: Internal Medicine

## 2012-10-02 ENCOUNTER — Telehealth: Payer: Self-pay | Admitting: *Deleted

## 2012-10-02 VITALS — BP 159/74 | HR 85 | Temp 97.0°F | Resp 20 | Ht 62.0 in | Wt 157.6 lb

## 2012-10-02 DIAGNOSIS — R21 Rash and other nonspecific skin eruption: Secondary | ICD-10-CM

## 2012-10-02 DIAGNOSIS — R197 Diarrhea, unspecified: Secondary | ICD-10-CM

## 2012-10-02 DIAGNOSIS — C799 Secondary malignant neoplasm of unspecified site: Secondary | ICD-10-CM

## 2012-10-02 DIAGNOSIS — C3430 Malignant neoplasm of lower lobe, unspecified bronchus or lung: Secondary | ICD-10-CM

## 2012-10-02 DIAGNOSIS — C7952 Secondary malignant neoplasm of bone marrow: Secondary | ICD-10-CM

## 2012-10-02 MED ORDER — PROCHLORPERAZINE MALEATE 10 MG PO TABS: 10.0000 mg | ORAL_TABLET | Freq: Four times a day (QID) | ORAL | Status: DC | PRN

## 2012-10-02 MED ORDER — ERLOTINIB HCL 150 MG PO TABS: 150.0000 mg | ORAL_TABLET | Freq: Every day | ORAL | Status: DC

## 2012-10-02 NOTE — Progress Notes (Signed)
Faxed disability form to [employer] @ [phone]. °

## 2012-10-02 NOTE — Progress Notes (Signed)
[facility] [phone]   Fax:([phone]  OFFICE PROGRESS NOTE  [name], MD [address] [name] [address]  DIAGNOSIS: Metastatic non-small cell lung cancer, adenocarcinoma with positive EGFR mutation in exon 19 and negative ALK gene translocation diagnosed in July 2013 with bone metastasis.   PRIOR THERAPY: Status post radiotherapy to the left face rib metastasis under the care of Dr. Mitzi Hansen.   CURRENT THERAPY:  1) treatment with Tarceva 150 mg by mouth daily, therapy beginning 07/20/2012. Status post approximately 3 months of therapy  2) Xgeva 120 mg subcutaneously every 4 weeks for bone disease.   INTERVAL HISTORY: Jacqueline Leonard 66 y.o. female returns to the clinic today for followup visit accompanied by her sister. The patient is feeling fine today with no specific complaints except for grade 1-2 skin rash as well as few episodes of diarrhea every now and then. She lost a few pounds recently. She is tolerating her treatment was Tarceva fairly well with no significant adverse effect except as mentioned above. She denied having any significant chest pain, shortness breath, cough or hemoptysis. The patient has repeat CT scan of the chest, abdomen and pelvis performed recently and she is here today for evaluation and discussion of her scan results.  MEDICAL HISTORY: Past Medical History  Diagnosis Date  . Lung mass   . Hypertension   . Hyperlipidemia   . Vitamin D deficiency   . Cancer, metastatic to bone 06/19/12    bx=L 5th ribmetastatic Adenocarcinoma with known lung mass  . Radiation 07/11/12-07/24/12    Palliative lung tx 30 gray in 10 fx  . Status post chemoradiation     Tarceva    ALLERGIES:  is allergic to penicillins.  MEDICATIONS:  Current Outpatient Prescriptions  Medication Sig Dispense Refill  . amLODipine (NORVASC) 5 MG tablet Take 5 mg by mouth daily.      . cholecalciferol (VITAMIN D) 1000 UNITS tablet Take  1,000 Units by mouth daily.      Marland Kitchen erlotinib (TARCEVA) 150 MG tablet Take 150 mg by mouth daily. Take on an empty stomach 1 hour before meals or 2 hours after.      . Multiple Minerals-Vitamins (CALCIUM & VIT D3 BONE HEALTH PO) Take 750 mg by mouth 2 (two) times daily.      . valsartan-hydrochlorothiazide (DIOVAN-HCT) 160-25 MG per tablet Take 1 tablet by mouth daily.      . prochlorperazine (COMPAZINE) 10 MG tablet Take 1 tablet (10 mg total) by mouth every 6 (six) hours as needed.  60 tablet  0    SURGICAL HISTORY:  Past Surgical History  Procedure Date  . Soft tissue biopsy 06/19/12    L 5th rib=metastatic adenocarcinoma    REVIEW OF SYSTEMS:  A comprehensive review of systems was negative except for: Constitutional: positive for fatigue Gastrointestinal: positive for diarrhea Allergic/Immunologic: positive for Maculopapular skin rash especially on the nose and face.   PHYSICAL EXAMINATION: General appearance: alert, cooperative and no distress Head: Normocephalic, without obvious abnormality, atraumatic Neck: no adenopathy Lymph nodes: Cervical, supraclavicular, and axillary nodes normal. Resp: clear to auscultation bilaterally Cardio: regular rate and rhythm, S1, S2 normal, no murmur, click, rub or gallop GI: soft, non-tender; bowel sounds normal; no masses,  no organomegaly Extremities: extremities normal, atraumatic, no cyanosis or edema Neurologic: Alert and oriented X 3, normal strength and tone. Normal symmetric reflexes. Normal coordination and gait  ECOG PERFORMANCE STATUS: 1 - Symptomatic but completely  ambulatory  Blood pressure 159/74, pulse 85, temperature 97 F (36.1 C), temperature source Oral, resp. rate 20, height 5\' 2"  (1.575 m), weight 157 lb 9.6 oz (71.487 kg).  LABORATORY DATA: Lab Results  Component Value Date   WBC 6.9 09/27/2012   HGB 10.7* 09/27/2012   HCT 31.8* 09/27/2012   MCV 86.9 09/27/2012   PLT 252 09/27/2012      Chemistry      Component Value  Date/Time   NA 139 09/27/2012 1319   NA 136 09/23/2012 1525   K 4.2 09/27/2012 1319   K 4.0 09/23/2012 1525   CL 109* 09/27/2012 1319   CL 101 09/23/2012 1525   CO2 27 09/27/2012 1319   CO2 26 09/23/2012 1525   BUN 13.0 09/27/2012 1319   BUN 30* 09/23/2012 1525   CREATININE 0.7 09/27/2012 1319   CREATININE 0.84 09/23/2012 1525      Component Value Date/Time   CALCIUM 9.6 09/27/2012 1319   CALCIUM 8.9 09/23/2012 1525   ALKPHOS 54 09/27/2012 1319   ALKPHOS 52 09/23/2012 1525   AST 24 09/27/2012 1319   AST 24 09/23/2012 1525   ALT 33 09/27/2012 1319   ALT 31 09/23/2012 1525   BILITOT 0.81 09/27/2012 1319   BILITOT 0.8 09/23/2012 1525       RADIOGRAPHIC STUDIES: Ct Chest W Contrast  09/27/2012  *RADIOLOGY REPORT*  Clinical Data:  Restaging lung cancer.  Oral chemotherapy in progress.  CT CHEST, ABDOMEN AND PELVIS WITH CONTRAST  Technique:  Multidetector CT imaging of the chest, abdomen and pelvis was performed following the standard protocol during bolus administration of intravenous contrast.  Contrast: OMNIPAQUE IOHEXOL 300 MG/ML  SOLN PET CT 06/13/2012  Comparison:   None.   CT CHEST  Findings:  No axillary or supraclavicular lymphadenopathy.  No mediastinal or hilar lymphadenopathy.  No pericardial fluid. Esophagus is normal.  Decrease in size of medial left lower lobe mass which measures 2.4 x 1.2 cm compared to 4.9 x 3.4 cm on prior.  There is new peripheral nodular pleural thickening in the left lower lobe which could relate to radiation therapy.  There are several nodules within the right lower lobe with associated  ground-glass opacities  and appear stable compared to prior.  Expansile rib metastasis in the left lateral rib measuring 3.2 x 1.9 cm is more sclerotic than on comparison exam and  similar size.  IMPRESSION:  1.  Reduction in size of left lower lobe mass. 2.  Nodular pleural thickening in the left lower lobe may relate to radiation therapy. 3.  Small ground-glass nodules in  the right lower lobe are stable. Attention on follow-up.   CT ABDOMEN AND PELVIS  Findings:  No focal hepatic lesion.  The gallbladder, pancreas, spleen, adrenal glands, and kidneys are normal.  The stomach, small bowel, appendix, and cecum are normal.  The colon rectosigmoid colon are normal.  Abdominal aorta normal caliber.  No retroperitoneal periportal lymphadenopathy.  There is a cluster of lymph nodes the gastric ligament region on 12 mm (image 43).  These are unchanged from prior.  View of the skeleton demonstrates multiple sclerotic lesions within the spine and sacrum at sites of previous hypermetabolic lucent lesions.  These likely represent a healing response to therapy.  IMPRESSION:  1.  No evidence of metastasis progression in the abdomen or pelvis. 2.  Stable gastrohepatic ligament lymph nodes. 3.  Interval increase in sclerosis of the previously hypermetabolic lucent lesions suggesting healing response.  No new  lesions identified.   Original Report Authenticated By: Genevive Bi, M.D.     ASSESSMENT: This is a very pleasant 66 years old Hispanic female recently diagnosed with metastatic non-small cell lung cancer, adenocarcinoma with positive EGFR mutation in exon 19. The patient is currently on treatment with Tarceva 150 mg by mouth daily for the last 3 months. She is tolerating her treatment fairly well and she has significant improvement in her disease on the recent CT scan of the chest  PLAN: I discussed the scan results with the patient and her sister. I recommended for her to continue on Tarceva with the current dose. She would come back for followup visit in one month's for reevaluation and management any adverse effect of her treatment. She will continue on clindamycin lotion for the skin rash. She was advised to take Imodium on as-needed basis for diarrhea. The patient was advised to call immediately if she has any concerning symptoms in the interval.  All questions were answered.  The patient knows to call the clinic with any problems, questions or concerns. We can certainly see the patient much sooner if necessary.  I spent 15 minutes counseling the patient face to face. The total time spent in the appointment was 25 minutes.

## 2012-10-02 NOTE — Telephone Encounter (Signed)
[employer] FMLA paperwork signed by Dr Donnald Garre and returned to medical mgmt for review.  SLJ

## 2012-10-02 NOTE — Telephone Encounter (Signed)
Erroneous encounter

## 2012-10-02 NOTE — Telephone Encounter (Signed)
Gave patient appt for lab and and ML for December 2013, pt wil see Dr Marina Goodell @ [facility] on 10/18/12, interpreter for GI appt has been called in, pt refused interpreter for ML appt

## 2012-10-03 ENCOUNTER — Other Ambulatory Visit: Payer: Self-pay | Admitting: *Deleted

## 2012-10-03 DIAGNOSIS — C799 Secondary malignant neoplasm of unspecified site: Secondary | ICD-10-CM

## 2012-10-03 MED ORDER — PROCHLORPERAZINE MALEATE 10 MG PO TABS: 10.0000 mg | ORAL_TABLET | Freq: Four times a day (QID) | ORAL | Status: DC | PRN

## 2012-10-05 NOTE — Patient Instructions (Signed)
Your CT scan showed improvement in her disease. Continue treatment was Tarceva at the current dose. Followup in one month

## 2012-10-08 ENCOUNTER — Telehealth: Payer: Self-pay | Admitting: Medical Oncology

## 2012-10-08 NOTE — Telephone Encounter (Signed)
Niece called wanting appt for GI sooner than 10/18/12. I spoke to [place] and told her to have  pt call PCP to see if there is a GI in that group that can see her sooner. SHe will still keep Dr Lamar Sprinkles appt

## 2012-10-18 ENCOUNTER — Ambulatory Visit (INDEPENDENT_AMBULATORY_CARE_PROVIDER_SITE_OTHER): Payer: 59 | Admitting: Internal Medicine

## 2012-10-18 ENCOUNTER — Encounter: Payer: Self-pay | Admitting: Internal Medicine

## 2012-10-18 VITALS — BP 118/68 | HR 81 | Ht 62.0 in | Wt 150.2 lb

## 2012-10-18 DIAGNOSIS — IMO0001 Reserved for inherently not codable concepts without codable children: Secondary | ICD-10-CM

## 2012-10-18 DIAGNOSIS — C3490 Malignant neoplasm of unspecified part of unspecified bronchus or lung: Secondary | ICD-10-CM

## 2012-10-18 DIAGNOSIS — C801 Malignant (primary) neoplasm, unspecified: Secondary | ICD-10-CM

## 2012-10-18 DIAGNOSIS — K5900 Constipation, unspecified: Secondary | ICD-10-CM

## 2012-10-18 DIAGNOSIS — R630 Anorexia: Secondary | ICD-10-CM

## 2012-10-18 DIAGNOSIS — R110 Nausea: Secondary | ICD-10-CM

## 2012-10-18 MED ORDER — OMEPRAZOLE 40 MG PO CPDR: 40.0000 mg | DELAYED_RELEASE_CAPSULE | Freq: Every day | ORAL | Status: DC

## 2012-10-18 NOTE — Progress Notes (Signed)
HISTORY OF PRESENT ILLNESS:  Jacqueline Leonard is a 66 y.o. female with lung cancer metastatic to the left ribs. She is sent today regarding anorexia, nausea, left upper quadrant pain, and constipation. After being diagnosed with lung cancer, she underwent 2 weeks of radiation therapy from August 14 through 07/24/2012. Thereafter, she began daily chemotherapy with Tarseva. Shortly thereafter she developed problems with nausea, dysgeusia, anorexia with early satiety, and associated weight loss. Also constipation over that same timeframe. No vomiting. No bleeding. Left upper quadrant pain has been going on longer. Occasionally seems worse with meals. Direct palpation as well. She is from [place] and accompanied by her niece who helped with translation. Patient has been using Phenergan for nausea. This helps. She requires about something to take for constipation. No heartburn or indigestion. No dysphagia. She did have a complete colonoscopy with Dr. Marisue Brooklyn in 2003. This was normal. Recent CT scan of the abdomen and pelvis 09/27/2012 revealed no significant abdominal abnormalities.  REVIEW OF SYSTEMS:  All non-GI ROS negative   Past Medical History  Diagnosis Date  . Lung mass   . Hypertension   . Hyperlipidemia   . Vitamin D deficiency   . Cancer, metastatic to bone 06/19/12    bx=L 5th ribmetastatic Adenocarcinoma with known lung mass  . Radiation 07/11/12-07/24/12    Palliative lung tx 30 gray in 10 fx  . Status post chemoradiation     Tarceva  . External hemorrhoids     Past Surgical History  Procedure Date  . Soft tissue biopsy 06/19/12    L 5th rib=metastatic adenocarcinoma  . Ovarian cyst removal     Social History Jacqueline Leonard  reports that she has never smoked. She has never used smokeless tobacco. She reports that she does not drink alcohol. Her drug history not on file.  family history includes Breast cancer in her sister; Diabetes in her sister; Heart disease in her mother;  and Prostate cancer in her brother.  Allergies  Allergen Reactions  . Penicillins Other (See Comments)    Rapid heart beat       PHYSICAL EXAMINATION: Vital signs: BP 118/68  Pulse 81  Ht 5\' 2"  (1.575 m)  Wt 150 lb 3.2 oz (68.13 kg)  BMI 27.47 kg/m2  SpO2 98%  Constitutional: generally well-appearing, no acute distress. Alopecia Psychiatric: alert and oriented x3, cooperative Eyes: extraocular movements intact, anicteric, conjunctiva pink Mouth: oral pharynx moist, no lesions Neck: supple no lymphadenopathy Cardiovascular: heart regular rate and rhythm, no murmur Lungs: clear to auscultation bilaterally Abdomen: soft, nontender, nondistended, no obvious ascites, no peritoneal signs, normal bowel sounds, no organomegaly Rectal:omitted Extremities: no lower extremity edema bilaterally Skin: no lesions on visible extremities Neuro: No focal deficits.   ASSESSMENT:  #1. Nausea, anorexia, and decreased appetite all secondary to chemotherapy. These side effects are described to occur  in 23-33%, 9-52%, and 25% of patients,respectively. The timing of therapy, lack of previous complaints prior to therapy, no obvious other cause identified by history, exam, or x-rays is supportive. #2. Constipation. May be due to decreased by mouth intake. Also, less than a percent of patients on this regimen have constipation, though the most common side effect his diarrhea.   PLAN:  #1. Phenergan as needed #2. Milk of magnesia or MiraLax for constipation #3. Empiric PPI for upper GI mucosal protection. Multiple samples of Prilosec OTC 20 mg provided #4. Anticipate improvement of symptoms with time after completing chemotherapy  Discussed with patient and her niece

## 2012-10-18 NOTE — Patient Instructions (Addendum)
We have given you some samples of Prilosec to take  You may use Milk of Magnesia over the counter for constipation  Please follow up as needed

## 2012-10-21 ENCOUNTER — Ambulatory Visit: Payer: 59 | Admitting: Internal Medicine

## 2012-11-01 ENCOUNTER — Ambulatory Visit (HOSPITAL_BASED_OUTPATIENT_CLINIC_OR_DEPARTMENT_OTHER): Payer: 59

## 2012-11-01 ENCOUNTER — Other Ambulatory Visit (HOSPITAL_BASED_OUTPATIENT_CLINIC_OR_DEPARTMENT_OTHER): Payer: 59 | Admitting: Lab

## 2012-11-01 ENCOUNTER — Encounter: Payer: Self-pay | Admitting: Physician Assistant

## 2012-11-01 ENCOUNTER — Telehealth: Payer: Self-pay | Admitting: Internal Medicine

## 2012-11-01 ENCOUNTER — Ambulatory Visit (HOSPITAL_BASED_OUTPATIENT_CLINIC_OR_DEPARTMENT_OTHER): Payer: 59 | Admitting: Physician Assistant

## 2012-11-01 VITALS — BP 123/68 | HR 67 | Temp 97.1°F | Resp 18 | Ht 62.0 in | Wt 150.6 lb

## 2012-11-01 DIAGNOSIS — C799 Secondary malignant neoplasm of unspecified site: Secondary | ICD-10-CM

## 2012-11-01 DIAGNOSIS — C7952 Secondary malignant neoplasm of bone marrow: Secondary | ICD-10-CM

## 2012-11-01 DIAGNOSIS — C7951 Secondary malignant neoplasm of bone: Secondary | ICD-10-CM

## 2012-11-01 DIAGNOSIS — C3430 Malignant neoplasm of lower lobe, unspecified bronchus or lung: Secondary | ICD-10-CM

## 2012-11-01 DIAGNOSIS — C801 Malignant (primary) neoplasm, unspecified: Secondary | ICD-10-CM

## 2012-11-01 LAB — CBC WITH DIFFERENTIAL/PLATELET
BASO%: 0.4 % (ref 0.0–2.0)
HCT: 33.3 % — ABNORMAL LOW (ref 34.8–46.6)
MCHC: 33 g/dL (ref 31.5–36.0)
MONO#: 0.4 10*3/uL (ref 0.1–0.9)
NEUT%: 69.2 % (ref 38.4–76.8)
RDW: 14.7 % — ABNORMAL HIGH (ref 11.2–14.5)
WBC: 5.3 10*3/uL (ref 3.9–10.3)
lymph#: 1.1 10*3/uL (ref 0.9–3.3)

## 2012-11-01 LAB — COMPREHENSIVE METABOLIC PANEL (CC13)
ALT: 31 U/L (ref 0–55)
AST: 24 U/L (ref 5–34)
Albumin: 3.2 g/dL — ABNORMAL LOW (ref 3.5–5.0)
CO2: 28 mEq/L (ref 22–29)
Calcium: 9.3 mg/dL (ref 8.4–10.4)
Chloride: 107 mEq/L (ref 98–107)
Creatinine: 0.9 mg/dL (ref 0.6–1.1)
Potassium: 4 mEq/L (ref 3.5–5.1)
Sodium: 141 mEq/L (ref 136–145)
Total Protein: 6.6 g/dL (ref 6.4–8.3)

## 2012-11-01 MED ORDER — DENOSUMAB 120 MG/1.7ML ~~LOC~~ SOLN
120.0000 mg | Freq: Once | SUBCUTANEOUS | Status: AC
  Administered 2012-11-01: 120 mg via SUBCUTANEOUS
  Filled 2012-11-01: qty 1.7

## 2012-11-01 NOTE — Telephone Encounter (Signed)
appts made and printed for pt aom °

## 2012-11-01 NOTE — Progress Notes (Signed)
Consent form signed for X-geva. 

## 2012-11-01 NOTE — Patient Instructions (Addendum)
Continue Tarceva 150 mg by mouth daily Follow up in one month 

## 2012-11-05 ENCOUNTER — Telehealth: Payer: Self-pay | Admitting: Medical Oncology

## 2012-11-05 NOTE — Telephone Encounter (Signed)
Requests how lon gpt will be on chemotherapy . They are looking at extending her disability. I will ask our disability specialist to return [facility] call

## 2012-11-08 NOTE — Progress Notes (Signed)
[facility] Telephone:(336) [phone]   Fax:(336) [phone]  OFFICE PROGRESS NOTE  [name], MD [address] [name] [address]  DIAGNOSIS: Metastatic non-small cell lung cancer, adenocarcinoma with positive EGFR mutation in exon 19 and negative ALK gene translocation diagnosed in July 2013 with bone metastasis.   PRIOR THERAPY: Status post radiotherapy to the left face rib metastasis under the care of Dr. Mitzi Hansen.   CURRENT THERAPY:  1) treatment with Tarceva 150 mg by mouth daily, therapy beginning 07/20/2012. Status post approximately 4 months of therapy  2) Xgeva 120 mg subcutaneously every 4 weeks for bone disease.   INTERVAL HISTORY: Jacqueline Leonard 66 y.o. female returns to the clinic today for followup visit accompanied by an interpreter. The patient is feeling fine today with no specific complaints except for grade 1-2 skin rash. She has not had any problems with diarrhea  and in fact has had some issues with constipation. She has been using [employer] milk of magnesia. She reports that she is no longer taking the Prilosec as she is not having any further problems with acid reflux or heartburn. She has begun to eat better and has gained some weight. She voices no other specific complaints today. She is tolerating her treatment was Tarceva fairly well with no significant adverse effect except as mentioned above. She denied having any significant chest pain, shortness breath, cough or hemoptysis.   MEDICAL HISTORY: Past Medical History  Diagnosis Date  . Lung mass   . Hypertension   . Hyperlipidemia   . Vitamin D deficiency   . Cancer, metastatic to bone 06/19/12    bx=L 5th ribmetastatic Adenocarcinoma with known lung mass  . Radiation 07/11/12-07/24/12    Palliative lung tx 30 gray in 10 fx  . Status post chemoradiation     Tarceva  . External hemorrhoids     ALLERGIES:  is allergic to penicillins.  MEDICATIONS:  Current Outpatient  Prescriptions  Medication Sig Dispense Refill  . amLODipine (NORVASC) 5 MG tablet Take 5 mg by mouth daily.      . cholecalciferol (VITAMIN D) 1000 UNITS tablet Take 1,000 Units by mouth daily.      Marland Kitchen erlotinib (TARCEVA) 150 MG tablet Take 1 tablet (150 mg total) by mouth daily. Take on an empty stomach 1 hour before meals or 2 hours after.  90 tablet  0  . Multiple Minerals-Vitamins (CALCIUM & VIT D3 BONE HEALTH PO) Take 750 mg by mouth 2 (two) times daily.      Marland Kitchen omeprazole (PRILOSEC) 40 MG capsule Take 1 capsule (40 mg total) by mouth daily.  58 capsule  0  . prochlorperazine (COMPAZINE) 10 MG tablet Take 1 tablet (10 mg total) by mouth every 6 (six) hours as needed.  60 tablet  0  . valsartan-hydrochlorothiazide (DIOVAN-HCT) 160-25 MG per tablet Take 1 tablet by mouth daily.        SURGICAL HISTORY:  Past Surgical History  Procedure Date  . Soft tissue biopsy 06/19/12    L 5th rib=metastatic adenocarcinoma  . Ovarian cyst removal     REVIEW OF SYSTEMS:  A comprehensive review of systems was negative except for: Constitutional: positive for fatigue Gastrointestinal: positive for constipation Allergic/Immunologic: positive for Maculopapular skin rash especially on the nose and face.   PHYSICAL EXAMINATION: General appearance: alert, cooperative and no distress Head: Normocephalic, without obvious abnormality, atraumatic Neck: no adenopathy Lymph nodes: Cervical, supraclavicular, and axillary nodes normal. Resp: clear to auscultation  bilaterally Cardio: regular rate and rhythm, S1, S2 normal, no murmur, click, rub or gallop GI: soft, non-tender; bowel sounds normal; no masses,  no organomegaly Extremities: extremities normal, atraumatic, no cyanosis or edema Neurologic: Alert and oriented X 3, normal strength and tone. Normal symmetric reflexes. Normal coordination and gait Skin: Few scattered acneform eruptions on the face, concentrated on the cheeks and nose, minimal erythema, no  evidence of superinfection  ECOG PERFORMANCE STATUS: 1 - Symptomatic but completely ambulatory  Blood pressure 123/68, pulse 67, temperature 97.1 F (36.2 C), temperature source Oral, resp. rate 18, height 5\' 2"  (1.575 m), weight 150 lb 9.6 oz (68.312 kg).  LABORATORY DATA: Lab Results  Component Value Date   WBC 5.3 11/01/2012   HGB 11.0* 11/01/2012   HCT 33.3* 11/01/2012   MCV 87.4 11/01/2012   PLT 272 11/01/2012      Chemistry      Component Value Date/Time   NA 141 11/01/2012 1325   NA 136 09/23/2012 1525   K 4.0 11/01/2012 1325   K 4.0 09/23/2012 1525   CL 107 11/01/2012 1325   CL 101 09/23/2012 1525   CO2 28 11/01/2012 1325   CO2 26 09/23/2012 1525   BUN 18.0 11/01/2012 1325   BUN 30* 09/23/2012 1525   CREATININE 0.9 11/01/2012 1325   CREATININE 0.84 09/23/2012 1525      Component Value Date/Time   CALCIUM 9.3 11/01/2012 1325   CALCIUM 8.9 09/23/2012 1525   ALKPHOS 44 11/01/2012 1325   ALKPHOS 52 09/23/2012 1525   AST 24 11/01/2012 1325   AST 24 09/23/2012 1525   ALT 31 11/01/2012 1325   ALT 31 09/23/2012 1525   BILITOT 0.93 11/01/2012 1325   BILITOT 0.8 09/23/2012 1525       RADIOGRAPHIC STUDIES: Ct Chest W Contrast  09/27/2012  *RADIOLOGY REPORT*  Clinical Data:  Restaging lung cancer.  Oral chemotherapy in progress.  CT CHEST, ABDOMEN AND PELVIS WITH CONTRAST  Technique:  Multidetector CT imaging of the chest, abdomen and pelvis was performed following the standard protocol during bolus administration of intravenous contrast.  Contrast: OMNIPAQUE IOHEXOL 300 MG/ML  SOLN PET CT 06/13/2012  Comparison:   None.   CT CHEST  Findings:  No axillary or supraclavicular lymphadenopathy.  No mediastinal or hilar lymphadenopathy.  No pericardial fluid. Esophagus is normal.  Decrease in size of medial left lower lobe mass which measures 2.4 x 1.2 cm compared to 4.9 x 3.4 cm on prior.  There is new peripheral nodular pleural thickening in the left lower lobe which could relate to  radiation therapy.  There are several nodules within the right lower lobe with associated  ground-glass opacities  and appear stable compared to prior.  Expansile rib metastasis in the left lateral rib measuring 3.2 x 1.9 cm is more sclerotic than on comparison exam and  similar size.  IMPRESSION:  1.  Reduction in size of left lower lobe mass. 2.  Nodular pleural thickening in the left lower lobe may relate to radiation therapy. 3.  Small ground-glass nodules in the right lower lobe are stable. Attention on follow-up.   CT ABDOMEN AND PELVIS  Findings:  No focal hepatic lesion.  The gallbladder, pancreas, spleen, adrenal glands, and kidneys are normal.  The stomach, small bowel, appendix, and cecum are normal.  The colon rectosigmoid colon are normal.  Abdominal aorta normal caliber.  No retroperitoneal periportal lymphadenopathy.  There is a cluster of lymph nodes the gastric ligament region on  12 mm (image 43).  These are unchanged from prior.  View of the skeleton demonstrates multiple sclerotic lesions within the spine and sacrum at sites of previous hypermetabolic lucent lesions.  These likely represent a healing response to therapy.  IMPRESSION:  1.  No evidence of metastasis progression in the abdomen or pelvis. 2.  Stable gastrohepatic ligament lymph nodes. 3.  Interval increase in sclerosis of the previously hypermetabolic lucent lesions suggesting healing response.  No new lesions identified.   Original Report Authenticated By: Genevive Bi, M.D.     ASSESSMENT/PLAN: This is a very pleasant 66 years old Hispanic female recently diagnosed with metastatic non-small cell lung cancer, adenocarcinoma with positive EGFR mutation in exon 19. The patient is currently on treatment with Tarceva 150 mg by mouth daily for the last 4 months. She is tolerating her treatment fairly well and she has significant improvement in her disease on the recent CT scan of the chest. The patient was discussed with Dr. Melvyn Neth  today in Dr. Asa Lente absence. She will continue on Tarceva at 150 mg by mouth daily. For constipation as recommended she take MiraLAX once or twice daily as needed. She'll followup one month for another symptom management visit witivesayh a repeat CBC differential and C. Met. She will continue on clindamycin lotion for the skin rash. She was advised to take Imodium on as-needed basis for diarrhea.   Jacqueline Leonard, Jacqueline Bautch E, PA-C  All questions were answered. The patient knows to call the clinic with any problems, questions or concerns. We can certainly see the patient much sooner if necessary.  I spent 20 minutes counseling the patient face to face. The total time spent in the appointment was 30 minutes.

## 2012-11-29 ENCOUNTER — Other Ambulatory Visit (HOSPITAL_BASED_OUTPATIENT_CLINIC_OR_DEPARTMENT_OTHER): Payer: 59 | Admitting: Lab

## 2012-11-29 ENCOUNTER — Telehealth: Payer: Self-pay | Admitting: Internal Medicine

## 2012-11-29 ENCOUNTER — Ambulatory Visit (HOSPITAL_BASED_OUTPATIENT_CLINIC_OR_DEPARTMENT_OTHER): Payer: 59 | Admitting: Physician Assistant

## 2012-11-29 ENCOUNTER — Ambulatory Visit (HOSPITAL_BASED_OUTPATIENT_CLINIC_OR_DEPARTMENT_OTHER): Payer: 59

## 2012-11-29 VITALS — BP 139/66 | HR 77 | Temp 97.0°F | Resp 20 | Ht 62.0 in | Wt 151.0 lb

## 2012-11-29 DIAGNOSIS — C799 Secondary malignant neoplasm of unspecified site: Secondary | ICD-10-CM

## 2012-11-29 DIAGNOSIS — C7952 Secondary malignant neoplasm of bone marrow: Secondary | ICD-10-CM

## 2012-11-29 DIAGNOSIS — C3430 Malignant neoplasm of lower lobe, unspecified bronchus or lung: Secondary | ICD-10-CM

## 2012-11-29 DIAGNOSIS — K5900 Constipation, unspecified: Secondary | ICD-10-CM

## 2012-11-29 DIAGNOSIS — C7951 Secondary malignant neoplasm of bone: Secondary | ICD-10-CM

## 2012-11-29 DIAGNOSIS — R21 Rash and other nonspecific skin eruption: Secondary | ICD-10-CM

## 2012-11-29 LAB — CBC WITH DIFFERENTIAL/PLATELET
BASO%: 0.3 % (ref 0.0–2.0)
EOS%: 1.6 % (ref 0.0–7.0)
MCH: 29.2 pg (ref 25.1–34.0)
MCHC: 33.6 g/dL (ref 31.5–36.0)
MONO#: 0.5 10*3/uL (ref 0.1–0.9)
RBC: 4.05 10*6/uL (ref 3.70–5.45)
RDW: 14.9 % — ABNORMAL HIGH (ref 11.2–14.5)
WBC: 6.7 10*3/uL (ref 3.9–10.3)
lymph#: 1.4 10*3/uL (ref 0.9–3.3)

## 2012-11-29 LAB — COMPREHENSIVE METABOLIC PANEL (CC13)
ALT: 22 U/L (ref 0–55)
AST: 21 U/L (ref 5–34)
CO2: 27 mEq/L (ref 22–29)
Calcium: 9.5 mg/dL (ref 8.4–10.4)
Chloride: 105 mEq/L (ref 98–107)
Creatinine: 0.7 mg/dL (ref 0.6–1.1)
Sodium: 142 mEq/L (ref 136–145)
Total Bilirubin: 0.45 mg/dL (ref 0.20–1.20)
Total Protein: 7.4 g/dL (ref 6.4–8.3)

## 2012-11-29 MED ORDER — DENOSUMAB 120 MG/1.7ML ~~LOC~~ SOLN
120.0000 mg | Freq: Once | SUBCUTANEOUS | Status: AC
  Administered 2012-11-29: 120 mg via SUBCUTANEOUS
  Filled 2012-11-29: qty 1.7

## 2012-11-29 NOTE — Patient Instructions (Addendum)
Continue taking Tarceva 150 mg by mouth daily Follow up with Dr. Arbutus Ped in 1 month with a restaging CT scan of your chest, abdomen and pelvis to re-evaluate your disease

## 2012-11-29 NOTE — Telephone Encounter (Signed)
appts made and printed for pt aom °

## 2012-11-30 NOTE — Progress Notes (Signed)
[name] [phone]   Fax:([phone]  OFFICE PROGRESS NOTE  [name], MD [address] [name] [address]  DIAGNOSIS: Metastatic non-small cell lung cancer, adenocarcinoma with positive EGFR mutation in exon 19 and negative ALK gene translocation diagnosed in July 2013 with bone metastasis.   PRIOR THERAPY: Status post radiotherapy to the left face rib metastasis under the care of Dr. Mitzi Hansen.   CURRENT THERAPY:  1) treatment with Tarceva 150 mg by mouth daily, therapy beginning 07/20/2012. Status post approximately 4 months of therapy  2) Xgeva 120 mg subcutaneously every 4 weeks for bone disease.   INTERVAL HISTORY: Jacqueline Leonard 67 y.o. female returns to the clinic today for followup visit. The patient is feeling fine today with no specific complaints except for grade 1-2 skin rash.She denied diarrhea but is having problems with constipation. Her appetite has started to improve and she has gained a little weight recently. She is tolerating her treatment was Tarceva fairly well with no significant adverse effect except as mentioned above. She denied having any significant chest pain, shortness breath, cough or hemoptysis.   MEDICAL HISTORY: Past Medical History  Diagnosis Date  . Lung mass   . Hypertension   . Hyperlipidemia   . Vitamin D deficiency   . Cancer, metastatic to bone 06/19/12    bx=L 5th ribmetastatic Adenocarcinoma with known lung mass  . Radiation 07/11/12-07/24/12    Palliative lung tx 30 gray in 10 fx  . Status post chemoradiation     Tarceva  . External hemorrhoids     ALLERGIES:  is allergic to penicillins.  MEDICATIONS:  Current Outpatient Prescriptions  Medication Sig Dispense Refill  . amLODipine (NORVASC) 5 MG tablet Take 5 mg by mouth daily.      . cholecalciferol (VITAMIN D) 1000 UNITS tablet Take 1,000 Units by mouth daily.      Marland Kitchen erlotinib (TARCEVA) 150 MG tablet Take 1 tablet (150 mg total)  by mouth daily. Take on an empty stomach 1 hour before meals or 2 hours after.  90 tablet  0  . Multiple Minerals-Vitamins (CALCIUM & VIT D3 BONE HEALTH PO) Take 750 mg by mouth 2 (two) times daily.      . prochlorperazine (COMPAZINE) 10 MG tablet Take 1 tablet (10 mg total) by mouth every 6 (six) hours as needed.  60 tablet  0  . valsartan-hydrochlorothiazide (DIOVAN-HCT) 160-25 MG per tablet Take 1 tablet by mouth daily.        SURGICAL HISTORY:  Past Surgical History  Procedure Date  . Soft tissue biopsy 06/19/12    L 5th rib=metastatic adenocarcinoma  . Ovarian cyst removal     REVIEW OF SYSTEMS:  A comprehensive review of systems was negative except for: Constitutional: positive for fatigue Gastrointestinal: positive for constipation Allergic/Immunologic: positive for Maculopapular skin rash especially on the nose and face.   PHYSICAL EXAMINATION: General appearance: alert, cooperative and no distress Head: Normocephalic, without obvious abnormality, atraumatic Neck: no adenopathy Lymph nodes: Cervical, supraclavicular, and axillary nodes normal. Resp: clear to auscultation bilaterally Cardio: regular rate and rhythm, S1, S2 normal, no murmur, click, rub or gallop GI: soft, non-tender; bowel sounds normal; no masses,  no organomegaly Extremities: extremities normal, atraumatic, no cyanosis or edema Neurologic: Alert and oriented X 3, normal strength and tone. Normal symmetric reflexes. Normal coordination and gait  ECOG PERFORMANCE STATUS: 1 - Symptomatic but completely ambulatory  Blood pressure 139/66, pulse 77, temperature 97 F (  36.1 C), temperature source Oral, resp. rate 20, height 5\' 2"  (1.575 m), weight 151 lb (68.493 kg).  LABORATORY DATA: Lab Results  Component Value Date   WBC 6.7 11/29/2012   HGB 11.8 11/29/2012   HCT 35.2 11/29/2012   MCV 87.0 11/29/2012   PLT 254 11/29/2012      Chemistry      Component Value Date/Time   NA 142 11/29/2012 1342   NA 136 09/23/2012  1525   K 3.7 11/29/2012 1342   K 4.0 09/23/2012 1525   CL 105 11/29/2012 1342   CL 101 09/23/2012 1525   CO2 27 11/29/2012 1342   CO2 26 09/23/2012 1525   BUN 17.0 11/29/2012 1342   BUN 30* 09/23/2012 1525   CREATININE 0.7 11/29/2012 1342   CREATININE 0.84 09/23/2012 1525      Component Value Date/Time   CALCIUM 9.5 11/29/2012 1342   CALCIUM 8.9 09/23/2012 1525   ALKPHOS 44 11/29/2012 1342   ALKPHOS 52 09/23/2012 1525   AST 21 11/29/2012 1342   AST 24 09/23/2012 1525   ALT 22 11/29/2012 1342   ALT 31 09/23/2012 1525   BILITOT 0.45 11/29/2012 1342   BILITOT 0.8 09/23/2012 1525       RADIOGRAPHIC STUDIES: Ct Chest W Contrast  09/27/2012  *RADIOLOGY REPORT*  Clinical Data:  Restaging lung cancer.  Oral chemotherapy in progress.  CT CHEST, ABDOMEN AND PELVIS WITH CONTRAST  Technique:  Multidetector CT imaging of the chest, abdomen and pelvis was performed following the standard protocol during bolus administration of intravenous contrast.  Contrast: OMNIPAQUE IOHEXOL 300 MG/ML  SOLN PET CT 06/13/2012  Comparison:   None.   CT CHEST  Findings:  No axillary or supraclavicular lymphadenopathy.  No mediastinal or hilar lymphadenopathy.  No pericardial fluid. Esophagus is normal.  Decrease in size of medial left lower lobe mass which measures 2.4 x 1.2 cm compared to 4.9 x 3.4 cm on prior.  There is new peripheral nodular pleural thickening in the left lower lobe which could relate to radiation therapy.  There are several nodules within the right lower lobe with associated  ground-glass opacities  and appear stable compared to prior.  Expansile rib metastasis in the left lateral rib measuring 3.2 x 1.9 cm is more sclerotic than on comparison exam and  similar size.  IMPRESSION:  1.  Reduction in size of left lower lobe mass. 2.  Nodular pleural thickening in the left lower lobe may relate to radiation therapy. 3.  Small ground-glass nodules in the right lower lobe are stable. Attention on follow-up.   CT  ABDOMEN AND PELVIS  Findings:  No focal hepatic lesion.  The gallbladder, pancreas, spleen, adrenal glands, and kidneys are normal.  The stomach, small bowel, appendix, and cecum are normal.  The colon rectosigmoid colon are normal.  Abdominal aorta normal caliber.  No retroperitoneal periportal lymphadenopathy.  There is a cluster of lymph nodes the gastric ligament region on 12 mm (image 43).  These are unchanged from prior.  View of the skeleton demonstrates multiple sclerotic lesions within the spine and sacrum at sites of previous hypermetabolic lucent lesions.  These likely represent a healing response to therapy.  IMPRESSION:  1.  No evidence of metastasis progression in the abdomen or pelvis. 2.  Stable gastrohepatic ligament lymph nodes. 3.  Interval increase in sclerosis of the previously hypermetabolic lucent lesions suggesting healing response.  No new lesions identified.   Original Report Authenticated By: Genevive Bi, M.D.  ASSESSMENT/PLAN: This is a very pleasant 67 years old Hispanic female recently diagnosed with metastatic non-small cell lung cancer, adenocarcinoma with positive EGFR mutation in exon 19. The patient is currently on treatment with Tarceva 150 mg by mouth daily for the last 4 months. She is tolerating her treatment fairly well and she has significant improvement in her disease on the recent CT scan of the chest. The patient was discussed with Dr. Darrold Span in Dr. Asa Lente absence. She will continue on Tarceva 150 mg by mouth daily. She will be due for another restaging CT scan in February. She will try Metamucil for her constipation.  Laural Benes, Kendra Grissett E, PA-C   All questions were answered. The patient knows to call the clinic with any problems, questions or concerns. We can certainly see the patient much sooner if necessary.  I spent 20 minutes counseling the patient face to face. The total time spent in the appointment was 30 minutes.

## 2012-12-16 ENCOUNTER — Other Ambulatory Visit: Payer: Self-pay | Admitting: *Deleted

## 2012-12-16 DIAGNOSIS — C799 Secondary malignant neoplasm of unspecified site: Secondary | ICD-10-CM

## 2012-12-16 NOTE — Telephone Encounter (Signed)
THIS REFILL REQUEST FOR TARCEVA WAS GIVEN TO DR.MOHAMED'S NURSE, STEPHANIE JOHNSON,RN. 

## 2012-12-17 ENCOUNTER — Other Ambulatory Visit: Payer: Self-pay | Admitting: *Deleted

## 2012-12-17 MED ORDER — ERLOTINIB HCL 150 MG PO TABS: 150.0000 mg | ORAL_TABLET | Freq: Every day | ORAL | Status: DC

## 2012-12-17 NOTE — Addendum Note (Signed)
Addended by: Arvilla Meres on: 12/17/2012 10:23 AM   Modules accepted: Orders

## 2012-12-24 ENCOUNTER — Telehealth: Payer: Self-pay | Admitting: *Deleted

## 2012-12-24 NOTE — Telephone Encounter (Signed)
CALLED PATIENT TO ALTER FU VISIT FOR 12-25-12 DUE TO HAVING A SCAN ON 12-30-12, FU APPT. MOVED TO 01-01-13 AT 2:15 PM, PATIENT AGREED TO NEW TIME AND DATE.

## 2012-12-25 ENCOUNTER — Ambulatory Visit: Payer: 59 | Admitting: Radiation Oncology

## 2012-12-26 ENCOUNTER — Telehealth: Payer: Self-pay | Admitting: Internal Medicine

## 2012-12-26 NOTE — Telephone Encounter (Signed)
Gave pt oral contrast for CT today

## 2012-12-30 ENCOUNTER — Ambulatory Visit (HOSPITAL_COMMUNITY)
Admission: RE | Admit: 2012-12-30 | Discharge: 2012-12-30 | Disposition: A | Discharge status: Home / Self Care | Payer: 59 | Source: Ambulatory Visit | Attending: Physician Assistant | Admitting: Physician Assistant

## 2012-12-30 ENCOUNTER — Encounter (HOSPITAL_COMMUNITY): Payer: Self-pay

## 2012-12-30 DIAGNOSIS — R918 Other nonspecific abnormal finding of lung field: Secondary | ICD-10-CM | POA: Insufficient documentation

## 2012-12-30 DIAGNOSIS — C799 Secondary malignant neoplasm of unspecified site: Secondary | ICD-10-CM

## 2012-12-30 DIAGNOSIS — N281 Cyst of kidney, acquired: Secondary | ICD-10-CM | POA: Insufficient documentation

## 2012-12-30 DIAGNOSIS — C3490 Malignant neoplasm of unspecified part of unspecified bronchus or lung: Secondary | ICD-10-CM | POA: Insufficient documentation

## 2012-12-30 DIAGNOSIS — D259 Leiomyoma of uterus, unspecified: Secondary | ICD-10-CM | POA: Insufficient documentation

## 2012-12-30 DIAGNOSIS — J984 Other disorders of lung: Secondary | ICD-10-CM | POA: Insufficient documentation

## 2012-12-30 DIAGNOSIS — Z923 Personal history of irradiation: Secondary | ICD-10-CM | POA: Insufficient documentation

## 2012-12-30 DIAGNOSIS — C7951 Secondary malignant neoplasm of bone: Secondary | ICD-10-CM | POA: Insufficient documentation

## 2012-12-30 DIAGNOSIS — Z79899 Other long term (current) drug therapy: Secondary | ICD-10-CM | POA: Insufficient documentation

## 2012-12-30 MED ORDER — IOHEXOL 300 MG/ML  SOLN: 100.0000 mL | Freq: Once | INTRAMUSCULAR | Status: AC | PRN

## 2012-12-31 ENCOUNTER — Encounter: Payer: Self-pay | Admitting: Radiation Oncology

## 2013-01-01 ENCOUNTER — Telehealth: Payer: Self-pay | Admitting: Internal Medicine

## 2013-01-01 ENCOUNTER — Ambulatory Visit (HOSPITAL_BASED_OUTPATIENT_CLINIC_OR_DEPARTMENT_OTHER): Payer: 59 | Admitting: Internal Medicine

## 2013-01-01 ENCOUNTER — Ambulatory Visit
Admission: RE | Admit: 2013-01-01 | Discharge: 2013-01-01 | Disposition: A | Discharge status: Home / Self Care | Payer: 59 | Source: Ambulatory Visit | Attending: Radiation Oncology | Admitting: Radiation Oncology

## 2013-01-01 ENCOUNTER — Encounter: Payer: Self-pay | Admitting: Radiation Oncology

## 2013-01-01 ENCOUNTER — Ambulatory Visit (HOSPITAL_BASED_OUTPATIENT_CLINIC_OR_DEPARTMENT_OTHER): Payer: 59

## 2013-01-01 ENCOUNTER — Other Ambulatory Visit (HOSPITAL_BASED_OUTPATIENT_CLINIC_OR_DEPARTMENT_OTHER): Payer: 59 | Admitting: Lab

## 2013-01-01 ENCOUNTER — Encounter: Payer: Self-pay | Admitting: Internal Medicine

## 2013-01-01 VITALS — BP 140/66 | HR 75 | Temp 97.2°F | Resp 18 | Ht 62.0 in | Wt 153.8 lb

## 2013-01-01 VITALS — BP 131/51 | HR 78 | Temp 97.4°F | Resp 20 | Wt 155.1 lb

## 2013-01-01 DIAGNOSIS — C7951 Secondary malignant neoplasm of bone: Secondary | ICD-10-CM

## 2013-01-01 DIAGNOSIS — C7952 Secondary malignant neoplasm of bone marrow: Secondary | ICD-10-CM

## 2013-01-01 DIAGNOSIS — C3430 Malignant neoplasm of lower lobe, unspecified bronchus or lung: Secondary | ICD-10-CM

## 2013-01-01 DIAGNOSIS — C799 Secondary malignant neoplasm of unspecified site: Secondary | ICD-10-CM

## 2013-01-01 LAB — CBC & DIFF AND RETIC
BASO%: 0.1 % (ref 0.0–2.0)
EOS%: 2.1 % (ref 0.0–7.0)
HCT: 36 % (ref 34.8–46.6)
LYMPH%: 13.9 % — ABNORMAL LOW (ref 14.0–49.7)
MCH: 28 pg (ref 25.1–34.0)
MCHC: 32.2 g/dL (ref 31.5–36.0)
MONO#: 0.5 10*3/uL (ref 0.1–0.9)
NEUT%: 77.2 % — ABNORMAL HIGH (ref 38.4–76.8)
Platelets: 248 10*3/uL (ref 145–400)
RBC: 4.15 10*6/uL (ref 3.70–5.45)
WBC: 7.6 10*3/uL (ref 3.9–10.3)
nRBC: 0 % (ref 0–0)

## 2013-01-01 MED ORDER — DENOSUMAB 120 MG/1.7ML ~~LOC~~ SOLN
120.0000 mg | Freq: Once | SUBCUTANEOUS | Status: AC
  Administered 2013-01-01: 120 mg via SUBCUTANEOUS
  Filled 2013-01-01: qty 1.7

## 2013-01-01 NOTE — Telephone Encounter (Signed)
Gave pt appt for lab and ML with injection on March 2014, also scheduled pt for April 2014

## 2013-01-01 NOTE — Progress Notes (Signed)
[facility] Telephone:([phone]) [phone]   Fax:(336) [phone]  OFFICE PROGRESS NOTE  [name], MD [address] [name] [address]  DIAGNOSIS: Metastatic non-small cell lung cancer, adenocarcinoma with positive EGFR mutation in exon 19 and negative ALK gene translocation diagnosed in July 2013 with bone metastasis.   PRIOR THERAPY: Status post radiotherapy to the left face rib metastasis under the care of Dr. Mitzi Hansen.   CURRENT THERAPY:  1) treatment with Tarceva 150 mg by mouth daily, therapy beginning 07/20/2012. Status post approximately 4 months of therapy  2) Xgeva 120 mg subcutaneously every 4 weeks for bone disease.  INTERVAL HISTORY: Jacqueline Leonard 67 y.o. female returns to the clinic today for routine followup visit accompanied her husband. The patient is feeling fine today with no specific complaints except for mild skin rash. She denied having any significant diarrhea. She has no chest pain, shortness of breath, cough or hemoptysis. She denied having any significant weight loss or night sweats. The patient is tolerating her treatment with Tarceva fairly well. She had repeat CT scan of the chest, abdomen and pelvis performed recently and she is here for evaluation and discussion of her scan results.  MEDICAL HISTORY: Past Medical History  Diagnosis Date  . Lung mass   . Hypertension   . Hyperlipidemia   . Vitamin D deficiency   . Cancer, metastatic to bone 06/19/12    bx=L 5th ribmetastatic Adenocarcinoma with known lung mass  . Radiation 07/11/12-07/24/12    Palliative lung tx 30 gray in 10 fx  . Status post chemoradiation     Tarceva  . External hemorrhoids     ALLERGIES:  is allergic to penicillins.  MEDICATIONS:  Current Outpatient Prescriptions  Medication Sig Dispense Refill  . amLODipine (NORVASC) 5 MG tablet Take 5 mg by mouth daily.      . cholecalciferol (VITAMIN D) 1000 UNITS tablet Take 1,000 Units by mouth daily.      Marland Kitchen  erlotinib (TARCEVA) 150 MG tablet Take 1 tablet (150 mg total) by mouth daily. Take on an empty stomach 1 hour before meals or 2 hours after.  30 tablet  0  . Multiple Minerals-Vitamins (CALCIUM & VIT D3 BONE HEALTH PO) Take 750 mg by mouth 2 (two) times daily.      . valsartan-hydrochlorothiazide (DIOVAN-HCT) 160-25 MG per tablet Take 1 tablet by mouth daily.        SURGICAL HISTORY:  Past Surgical History  Procedure Date  . Soft tissue biopsy 06/19/12    L 5th rib=metastatic adenocarcinoma  . Ovarian cyst removal     REVIEW OF SYSTEMS:  A comprehensive review of systems was negative except for: Mild skin rash on the face   PHYSICAL EXAMINATION: General appearance: alert, cooperative and no distress Head: Normocephalic, without obvious abnormality, atraumatic Neck: no adenopathy Lymph nodes: Cervical, supraclavicular, and axillary nodes normal. Resp: clear to auscultation bilaterally Cardio: regular rate and rhythm, S1, S2 normal, no murmur, click, rub or gallop GI: soft, non-tender; bowel sounds normal; no masses,  no organomegaly Extremities: extremities normal, atraumatic, no cyanosis or edema Neurologic: Alert and oriented X 3, normal strength and tone. Normal symmetric reflexes. Normal coordination and gait  ECOG PERFORMANCE STATUS: 1 - Symptomatic but completely ambulatory  Blood pressure 140/66, pulse 75, temperature 97.2 F (36.2 C), temperature source Oral, resp. rate 18, height 5\' 2"  (1.575 m), weight 153 lb 12.8 oz (69.763 kg).  LABORATORY DATA: Lab Results  Component Value Date  WBC 7.6 01/01/2013   HGB 11.6 01/01/2013   HCT 36.0 01/01/2013   MCV 86.7 01/01/2013   PLT 248 01/01/2013      Chemistry      Component Value Date/Time   NA 142 11/29/2012 1342   NA 136 09/23/2012 1525   K 3.7 11/29/2012 1342   K 4.0 09/23/2012 1525   CL 105 11/29/2012 1342   CL 101 09/23/2012 1525   CO2 27 11/29/2012 1342   CO2 26 09/23/2012 1525   BUN 17.0 11/29/2012 1342   BUN 30* 09/23/2012  1525   CREATININE 0.7 11/29/2012 1342   CREATININE 0.84 09/23/2012 1525      Component Value Date/Time   CALCIUM 9.5 11/29/2012 1342   CALCIUM 8.9 09/23/2012 1525   ALKPHOS 44 11/29/2012 1342   ALKPHOS 52 09/23/2012 1525   AST 21 11/29/2012 1342   AST 24 09/23/2012 1525   ALT 22 11/29/2012 1342   ALT 31 09/23/2012 1525   BILITOT 0.45 11/29/2012 1342   BILITOT 0.8 09/23/2012 1525       RADIOGRAPHIC STUDIES: Ct Chest W Contrast  12/30/2012  *RADIOLOGY REPORT*  Clinical Data:  Metastatic lung cancer diagnosed 07/13.  Radiation therapy completed 09/13.  Currently on oral chemotherapy.  CT CHEST, ABDOMEN AND PELVIS WITH CONTRAST  Technique: Contiguous axial images of the chest abdomen and pelvis were obtained after IV contrast administration.  Contrast: 100  ml Omnipaque-300  Comparison: 09/27/2012.   CT CHEST  Findings: Lung windows demonstrate perifissural nodules on the right, measuring up to 4 mm (image 21/series 4), unchanged. A 4 mm right lower lobe lung nodule on image 21 is similar.  Pleural-based soft tissue density in the left lower lobe is similar, measuring 2.3 x 1.3 cm on image 23/series 4.  2.4 x 1.2 cm at the same level on the prior.  Mild surrounding probable radiation fibrosis. Left upper lobe calcified granuloma.  Soft tissue windows demonstrate no supraclavicular adenopathy. Tortuous descending thoracic aorta.  Borderline cardiomegaly, without pericardial or pleural effusion.  Minimal left-sided pleural thickening which is likely radiation induced. No central pulmonary embolism, on this non-dedicated study.  Fluid within the thoracic esophagus, including on image 31/series 2.  Similar osseous metastasis.  IMPRESSION:  1.  Similar left lower lobe pulmonary opacity.  Favored to represent treatment effects.  Stable residual tumor cannot be excluded. 2.  Stable scattered pulmonary nodules.  No new or progressive disease within the chest. 3.  Osseous metastasis. 4. Esophageal air fluid level  suggests dysmotility or gastroesophageal reflux.   CT ABDOMEN AND PELVIS  Findings:  Focal steatosis adjacent the falciform ligament.  Normal spleen, stomach, pancreas, gallbladder, biliary tract, adrenal glands, right kidney.  Lower pole left renal cyst of 2.8 cm.  No retroperitoneal or retrocrural adenopathy.  Gastrohepatic ligament nodes are less conspicuous than on the prior exam, and not pathologic by size criteria.  Normal colon and terminal ileum.  Normal small bowel without abdominal ascites.    No evidence of omental or peritoneal disease.  No pelvic adenopathy.  Retroverted uterus, with calcified fibroids within. Normal urinary bladder, without adnexal mass or significant free pelvic fluid.  Bilateral L5 pars defects.  Similar multifocal sclerotic metastasis within the thoracolumbar spine.  Expansile lesion within the 4th lateral left rib is similar.  No complicating fractures identified. Anterolisthesis of L5 and S1 measures 8 mm and is similar.  IMPRESSION:  1.  No extra osseous metastasis within the abdomen or pelvis. 2.  Similar osseous metastasis.  3.  Decreased conspicuity of indeterminate gastropathy ligament nodes. 4.  Uterine fibroid.   Original Report Authenticated By: Jeronimo Greaves, M.D.    ASSESSMENT: This is a very pleasant 67 years old Hispanic female with metastatic non-small cell lung cancer, adenocarcinoma with positive EGFR mutation in exon 19. She is currently on treatment with Tarceva 150 mg by mouth daily and tolerating it fairly well. The patient has no evidence for disease progression on his recent scan.   PLAN: I discussed the scan results with the patient today. I recommended for her to continue on Tarceva with the same dose 150 mg by mouth daily. She would come back for followup visit in one month's for reevaluation and management any adverse effect of her treatment. For the bone metastasis, the patient will continue on her current treatment with Xgeva every 4 weeks. The  patient requested a permission to go back to work and she was advised not to do any heavy lifting. She was also advised to call immediately if she has any concerning symptoms in the interval. All questions were answered. The patient knows to call the clinic with any problems, questions or concerns. We can certainly see the patient much sooner if necessary.  I spent 15 minutes counseling the patient face to face. The total time spent in the appointment was 25 minutes.

## 2013-01-01 NOTE — Patient Instructions (Signed)
No evidence for disease progression on his recent scan. Continue Tarceva 150 mg by mouth daily. Followup in one month.

## 2013-01-01 NOTE — Progress Notes (Signed)
Patient arrived ambulatory, follow up  Metastatic left lateral rib rad txs:07/11/12-07/24/12 Alert,oriented x3, no c/o nausea, pain, or shortness breath, still has rash on face from Tarceva, still taking stated patient, eating and drinking well, no fatigue improved stated patient 2:14 PM

## 2013-01-02 ENCOUNTER — Encounter: Payer: Self-pay | Admitting: Internal Medicine

## 2013-01-02 NOTE — Progress Notes (Signed)
Put disability form on nurse's desk. °

## 2013-01-02 NOTE — Progress Notes (Signed)
Radiation Oncology         [phone]) [phone] ________________________________  Name: Jacqueline Leonard MRN: [id number]  Date: 01/01/2013  DOB: Dec 20, 1945  Follow-Up Visit Note  CC: [name], MD  Loreli Slot, *  Diagnosis:   Metastatic non-small cell lung cancer  Interval Since Last Radiation:  6 months   Narrative:  The patient returns today for routine follow-up.  The patient indicates that she has done well. She feels that her energy level is quite good currently. She denies any significant shortness of breath. No difficulty with eating or drinking. She denies any pain currently. She has been continuing on Tarceva and she feels that this has been going well.                              ALLERGIES:  is allergic to penicillins.  Meds: Current Outpatient Prescriptions  Medication Sig Dispense Refill  . amLODipine (NORVASC) 5 MG tablet Take 5 mg by mouth daily.      . cholecalciferol (VITAMIN D) 1000 UNITS tablet Take 1,000 Units by mouth daily.      Marland Kitchen erlotinib (TARCEVA) 150 MG tablet Take 1 tablet (150 mg total) by mouth daily. Take on an empty stomach 1 hour before meals or 2 hours after.  30 tablet  0  . Multiple Minerals-Vitamins (CALCIUM & VIT D3 BONE HEALTH PO) Take 750 mg by mouth 2 (two) times daily.      . valsartan-hydrochlorothiazide (DIOVAN-HCT) 160-25 MG per tablet Take 1 tablet by mouth daily.      . polyethylene glycol (MIRALAX / GLYCOLAX) packet Take 17 g by mouth daily as needed.        Physical Findings: The patient is in no acute distress. Patient is alert and oriented.  weight is 155 lb 1.6 oz (70.353 kg). Her oral temperature is 97.4 F (36.3 C). Her blood pressure is 131/51 and her pulse is 78. Her respiration is 20. Marland Kitchen   General: Well-developed, in no acute distress HEENT: Normocephalic, atraumatic Cardiovascular: Regular rate and rhythm Respiratory: Clear to auscultation bilaterally GI: Soft, nontender, normal bowel sounds Extremities: No edema  present   Lab Findings: Lab Results  Component Value Date   WBC 7.6 01/01/2013   HGB 11.6 01/01/2013   HCT 36.0 01/01/2013   MCV 86.7 01/01/2013   PLT 248 01/01/2013     Radiographic Findings: Ct Chest W Contrast  12/30/2012  *RADIOLOGY REPORT*  Clinical Data:  Metastatic lung cancer diagnosed 07/13.  Radiation therapy completed 09/13.  Currently on oral chemotherapy.  CT CHEST, ABDOMEN AND PELVIS WITH CONTRAST  Technique: Contiguous axial images of the chest abdomen and pelvis were obtained after IV contrast administration.  Contrast: 100  ml Omnipaque-300  Comparison: 09/27/2012.  CT CHEST  Findings: Lung windows demonstrate perifissural nodules on the right, measuring up to 4 mm (image 21/series 4), unchanged. A 4 mm right lower lobe lung nodule on image 21 is similar.  Pleural-based soft tissue density in the left lower lobe is similar, measuring 2.3 x 1.3 cm on image 23/series 4.  2.4 x 1.2 cm at the same level on the prior.  Mild surrounding probable radiation fibrosis. Left upper lobe calcified granuloma.  Soft tissue windows demonstrate no supraclavicular adenopathy. Tortuous descending thoracic aorta.  Borderline cardiomegaly, without pericardial or pleural effusion.  Minimal left-sided pleural thickening which is likely radiation induced. No central pulmonary embolism, on this non-dedicated study.  Fluid within the  thoracic esophagus, including on image 31/series 2.  Similar osseous metastasis.  IMPRESSION:  1.  Similar left lower lobe pulmonary opacity.  Favored to represent treatment effects.  Stable residual tumor cannot be excluded. 2.  Stable scattered pulmonary nodules.  No new or progressive disease within the chest. 3.  Osseous metastasis. 4. Esophageal air fluid level suggests dysmotility or gastroesophageal reflux.  CT ABDOMEN AND PELVIS  Findings:  Focal steatosis adjacent the falciform ligament.  Normal spleen, stomach, pancreas, gallbladder, biliary tract, adrenal glands, right kidney.   Lower pole left renal cyst of 2.8 cm.  No retroperitoneal or retrocrural adenopathy.  Gastrohepatic ligament nodes are less conspicuous than on the prior exam, and not pathologic by size criteria.  Normal colon and terminal ileum.  Normal small bowel without abdominal ascites.    No evidence of omental or peritoneal disease.  No pelvic adenopathy.  Retroverted uterus, with calcified fibroids within. Normal urinary bladder, without adnexal mass or significant free pelvic fluid.  Bilateral L5 pars defects.  Similar multifocal sclerotic metastasis within the thoracolumbar spine.  Expansile lesion within the 4th lateral left rib is similar.  No complicating fractures identified. Anterolisthesis of L5 and S1 measures 8 mm and is similar.  IMPRESSION:  1.  No extra osseous metastasis within the abdomen or pelvis. 2.  Similar osseous metastasis. 3.  Decreased conspicuity of indeterminate gastropathy ligament nodes. 4.  Uterine fibroid.   Original Report Authenticated By: Jeronimo Greaves, M.D.    Ct Abdomen Pelvis W Contrast  12/30/2012  *RADIOLOGY REPORT*  Clinical Data:  Metastatic lung cancer diagnosed 07/13.  Radiation therapy completed 09/13.  Currently on oral chemotherapy.  CT CHEST, ABDOMEN AND PELVIS WITH CONTRAST  Technique: Contiguous axial images of the chest abdomen and pelvis were obtained after IV contrast administration.  Contrast: 100  ml Omnipaque-300  Comparison: 09/27/2012.  CT CHEST  Findings: Lung windows demonstrate perifissural nodules on the right, measuring up to 4 mm (image 21/series 4), unchanged. A 4 mm right lower lobe lung nodule on image 21 is similar.  Pleural-based soft tissue density in the left lower lobe is similar, measuring 2.3 x 1.3 cm on image 23/series 4.  2.4 x 1.2 cm at the same level on the prior.  Mild surrounding probable radiation fibrosis. Left upper lobe calcified granuloma.  Soft tissue windows demonstrate no supraclavicular adenopathy. Tortuous descending thoracic aorta.   Borderline cardiomegaly, without pericardial or pleural effusion.  Minimal left-sided pleural thickening which is likely radiation induced. No central pulmonary embolism, on this non-dedicated study.  Fluid within the thoracic esophagus, including on image 31/series 2.  Similar osseous metastasis.  IMPRESSION:  1.  Similar left lower lobe pulmonary opacity.  Favored to represent treatment effects.  Stable residual tumor cannot be excluded. 2.  Stable scattered pulmonary nodules.  No new or progressive disease within the chest. 3.  Osseous metastasis. 4. Esophageal air fluid level suggests dysmotility or gastroesophageal reflux.  CT ABDOMEN AND PELVIS  Findings:  Focal steatosis adjacent the falciform ligament.  Normal spleen, stomach, pancreas, gallbladder, biliary tract, adrenal glands, right kidney.  Lower pole left renal cyst of 2.8 cm.  No retroperitoneal or retrocrural adenopathy.  Gastrohepatic ligament nodes are less conspicuous than on the prior exam, and not pathologic by size criteria.  Normal colon and terminal ileum.  Normal small bowel without abdominal ascites.    No evidence of omental or peritoneal disease.  No pelvic adenopathy.  Retroverted uterus, with calcified fibroids within. Normal urinary bladder, without  adnexal mass or significant free pelvic fluid.  Bilateral L5 pars defects.  Similar multifocal sclerotic metastasis within the thoracolumbar spine.  Expansile lesion within the 4th lateral left rib is similar.  No complicating fractures identified. Anterolisthesis of L5 and S1 measures 8 mm and is similar.  IMPRESSION:  1.  No extra osseous metastasis within the abdomen or pelvis. 2.  Similar osseous metastasis. 3.  Decreased conspicuity of indeterminate gastropathy ligament nodes. 4.  Uterine fibroid.   Original Report Authenticated By: Jeronimo Greaves, M.D.     Impression:    It appears that the patient is doing well at this time. She has no pain in the area of prior treatment. She is  continuing close followup with medical oncology and continues on Tarceva.  Plan:  She will return to our clinic on a when necessary basis.  I spent 10 minutes with the patient today, the majority of which was spent counseling the patient on the diagnosis of cancer and coordinating care.   Radene Gunning, M.D., Ph.D.

## 2013-01-29 ENCOUNTER — Other Ambulatory Visit (HOSPITAL_BASED_OUTPATIENT_CLINIC_OR_DEPARTMENT_OTHER): Payer: 59 | Admitting: Lab

## 2013-01-29 ENCOUNTER — Ambulatory Visit (HOSPITAL_BASED_OUTPATIENT_CLINIC_OR_DEPARTMENT_OTHER): Payer: 59

## 2013-01-29 ENCOUNTER — Telehealth: Payer: Self-pay | Admitting: Internal Medicine

## 2013-01-29 ENCOUNTER — Ambulatory Visit (HOSPITAL_BASED_OUTPATIENT_CLINIC_OR_DEPARTMENT_OTHER): Payer: 59 | Admitting: Physician Assistant

## 2013-01-29 VITALS — BP 125/61 | HR 66 | Temp 97.7°F | Resp 18 | Ht 62.0 in | Wt 158.0 lb

## 2013-01-29 DIAGNOSIS — C7952 Secondary malignant neoplasm of bone marrow: Secondary | ICD-10-CM

## 2013-01-29 DIAGNOSIS — C3430 Malignant neoplasm of lower lobe, unspecified bronchus or lung: Secondary | ICD-10-CM

## 2013-01-29 DIAGNOSIS — C7951 Secondary malignant neoplasm of bone: Secondary | ICD-10-CM

## 2013-01-29 DIAGNOSIS — C799 Secondary malignant neoplasm of unspecified site: Secondary | ICD-10-CM

## 2013-01-29 DIAGNOSIS — R21 Rash and other nonspecific skin eruption: Secondary | ICD-10-CM

## 2013-01-29 LAB — CBC WITH DIFFERENTIAL/PLATELET
BASO%: 0.3 % (ref 0.0–2.0)
Basophils Absolute: 0 10*3/uL (ref 0.0–0.1)
EOS%: 3.4 % (ref 0.0–7.0)
HCT: 31.6 % — ABNORMAL LOW (ref 34.8–46.6)
HGB: 10.6 g/dL — ABNORMAL LOW (ref 11.6–15.9)
LYMPH%: 27.2 % (ref 14.0–49.7)
MCH: 28.9 pg (ref 25.1–34.0)
MCHC: 33.7 g/dL (ref 31.5–36.0)
MCV: 85.8 fL (ref 79.5–101.0)
MONO%: 8.2 % (ref 0.0–14.0)
NEUT%: 60.9 % (ref 38.4–76.8)
Platelets: 225 10*3/uL (ref 145–400)

## 2013-01-29 LAB — COMPREHENSIVE METABOLIC PANEL (CC13)
Albumin: 3.2 g/dL — ABNORMAL LOW (ref 3.5–5.0)
Alkaline Phosphatase: 36 U/L — ABNORMAL LOW (ref 40–150)
CO2: 29 mEq/L (ref 22–29)
Calcium: 8.7 mg/dL (ref 8.4–10.4)
Chloride: 105 mEq/L (ref 98–107)
Glucose: 98 mg/dl (ref 70–99)
Potassium: 3.5 mEq/L (ref 3.5–5.1)
Sodium: 141 mEq/L (ref 136–145)
Total Protein: 6.6 g/dL (ref 6.4–8.3)

## 2013-01-29 MED ORDER — DENOSUMAB 120 MG/1.7ML ~~LOC~~ SOLN
120.0000 mg | Freq: Once | SUBCUTANEOUS | Status: AC
  Administered 2013-01-29: 120 mg via SUBCUTANEOUS
  Filled 2013-01-29: qty 1.7

## 2013-01-29 NOTE — Patient Instructions (Addendum)
Continue Tarceva 150 mg by mouth daily Continue monthly Xgeva injections Follow up in one month

## 2013-01-29 NOTE — Telephone Encounter (Signed)
, °

## 2013-02-02 NOTE — Progress Notes (Signed)
[facility] [phone]   Fax:([phone]  OFFICE PROGRESS NOTE  [name], MD [address] [name] [address]  DIAGNOSIS: Metastatic non-small cell lung cancer, adenocarcinoma with positive EGFR mutation in exon 19 and negative ALK gene translocation diagnosed in July 2013 with bone metastasis.   PRIOR THERAPY: Status post radiotherapy to the left face rib metastasis under the care of Dr. Mitzi Hansen.   CURRENT THERAPY:  1) treatment with Tarceva 150 mg by mouth daily, therapy beginning 07/20/2012. Status post approximately 5 months of therapy  2) Xgeva 120 mg subcutaneously every 4 weeks for bone disease.  INTERVAL HISTORY: Jacqueline Leonard 67 y.o. female returns to the clinic today for routine followup visit.  The patient is feeling fine today with no specific complaints except for mild skin rash. She denied having any significant diarrhea. She has no chest pain, shortness of breath, cough or hemoptysis. She denied having any significant weight loss or night sweats. The patient is tolerating her treatment with Tarceva fairly well. She is planning a trip to [facility] towards the end of this month.   MEDICAL HISTORY: Past Medical History  Diagnosis Date  . Lung mass   . Hypertension   . Hyperlipidemia   . Vitamin D deficiency   . Cancer, metastatic to bone 06/19/12    bx=L 5th ribmetastatic Adenocarcinoma with known lung mass  . Radiation 07/11/12-07/24/12    Palliative lung tx 30 gray in 10 fx  . Status post chemoradiation     Tarceva  . External hemorrhoids     ALLERGIES:  is allergic to penicillins.  MEDICATIONS:  Current Outpatient Prescriptions  Medication Sig Dispense Refill  . amLODipine (NORVASC) 5 MG tablet Take 5 mg by mouth daily.      . cholecalciferol (VITAMIN D) 1000 UNITS tablet Take 1,000 Units by mouth daily.      Marland Kitchen erlotinib (TARCEVA) 150 MG tablet Take 1 tablet (150 mg total) by mouth daily. Take on an empty  stomach 1 hour before meals or 2 hours after.  30 tablet  0  . Multiple Minerals-Vitamins (CALCIUM & VIT D3 BONE HEALTH PO) Take 750 mg by mouth 2 (two) times daily.      . polyethylene glycol (MIRALAX / GLYCOLAX) packet Take 17 g by mouth daily as needed.      . simvastatin (ZOCOR) 20 MG tablet       . valsartan-hydrochlorothiazide (DIOVAN-HCT) 160-25 MG per tablet Take 1 tablet by mouth daily.       No current facility-administered medications for this visit.    SURGICAL HISTORY:  Past Surgical History  Procedure Laterality Date  . Soft tissue biopsy  06/19/12    L 5th rib=metastatic adenocarcinoma  . Ovarian cyst removal      REVIEW OF SYSTEMS:  A comprehensive review of systems was negative except for: Mild skin rash on the face   PHYSICAL EXAMINATION: General appearance: alert, cooperative and no distress Head: Normocephalic, without obvious abnormality, atraumatic Neck: no adenopathy Lymph nodes: Cervical, supraclavicular, and axillary nodes normal. Resp: clear to auscultation bilaterally Cardio: regular rate and rhythm, S1, S2 normal, no murmur, click, rub or gallop GI: soft, non-tender; bowel sounds normal; no masses,  no organomegaly Extremities: extremities normal, atraumatic, no cyanosis or edema Neurologic: Alert and oriented X 3, normal strength and tone. Normal symmetric reflexes. Normal coordination and gait  ECOG PERFORMANCE STATUS: 1 - Symptomatic but completely ambulatory  Blood pressure 125/61, pulse 66,  temperature 97.7 F (36.5 C), temperature source Oral, resp. rate 18, height 5\' 2"  (1.575 m), weight 158 lb (71.668 kg).  LABORATORY DATA: Lab Results  Component Value Date   WBC 5.2 01/29/2013   HGB 10.6* 01/29/2013   HCT 31.6* 01/29/2013   MCV 85.8 01/29/2013   PLT 225 01/29/2013      Chemistry      Component Value Date/Time   NA 141 01/29/2013 1504   NA 136 09/23/2012 1525   K 3.5 01/29/2013 1504   K 4.0 09/23/2012 1525   CL 105 01/29/2013 1504   CL 101  09/23/2012 1525   CO2 29 01/29/2013 1504   CO2 26 09/23/2012 1525   BUN 20.5 01/29/2013 1504   BUN 30* 09/23/2012 1525   CREATININE 0.7 01/29/2013 1504   CREATININE 0.84 09/23/2012 1525      Component Value Date/Time   CALCIUM 8.7 01/29/2013 1504   CALCIUM 8.9 09/23/2012 1525   ALKPHOS 36* 01/29/2013 1504   ALKPHOS 52 09/23/2012 1525   AST 18 01/29/2013 1504   AST 24 09/23/2012 1525   ALT 19 01/29/2013 1504   ALT 31 09/23/2012 1525   BILITOT 0.46 01/29/2013 1504   BILITOT 0.8 09/23/2012 1525       RADIOGRAPHIC STUDIES: Ct Chest W Contrast  12/30/2012  *RADIOLOGY REPORT*  Clinical Data:  Metastatic lung cancer diagnosed 07/13.  Radiation therapy completed 09/13.  Currently on oral chemotherapy.  CT CHEST, ABDOMEN AND PELVIS WITH CONTRAST  Technique: Contiguous axial images of the chest abdomen and pelvis were obtained after IV contrast administration.  Contrast: 100  ml Omnipaque-300  Comparison: 09/27/2012.   CT CHEST  Findings: Lung windows demonstrate perifissural nodules on the right, measuring up to 4 mm (image 21/series 4), unchanged. A 4 mm right lower lobe lung nodule on image 21 is similar.  Pleural-based soft tissue density in the left lower lobe is similar, measuring 2.3 x 1.3 cm on image 23/series 4.  2.4 x 1.2 cm at the same level on the prior.  Mild surrounding probable radiation fibrosis. Left upper lobe calcified granuloma.  Soft tissue windows demonstrate no supraclavicular adenopathy. Tortuous descending thoracic aorta.  Borderline cardiomegaly, without pericardial or pleural effusion.  Minimal left-sided pleural thickening which is likely radiation induced. No central pulmonary embolism, on this non-dedicated study.  Fluid within the thoracic esophagus, including on image 31/series 2.  Similar osseous metastasis.  IMPRESSION:  1.  Similar left lower lobe pulmonary opacity.  Favored to represent treatment effects.  Stable residual tumor cannot be excluded. 2.  Stable scattered pulmonary  nodules.  No new or progressive disease within the chest. 3.  Osseous metastasis. 4. Esophageal air fluid level suggests dysmotility or gastroesophageal reflux.   CT ABDOMEN AND PELVIS  Findings:  Focal steatosis adjacent the falciform ligament.  Normal spleen, stomach, pancreas, gallbladder, biliary tract, adrenal glands, right kidney.  Lower pole left renal cyst of 2.8 cm.  No retroperitoneal or retrocrural adenopathy.  Gastrohepatic ligament nodes are less conspicuous than on the prior exam, and not pathologic by size criteria.  Normal colon and terminal ileum.  Normal small bowel without abdominal ascites.    No evidence of omental or peritoneal disease.  No pelvic adenopathy.  Retroverted uterus, with calcified fibroids within. Normal urinary bladder, without adnexal mass or significant free pelvic fluid.  Bilateral L5 pars defects.  Similar multifocal sclerotic metastasis within the thoracolumbar spine.  Expansile lesion within the 4th lateral left rib is similar.  No complicating  fractures identified. Anterolisthesis of L5 and S1 measures 8 mm and is similar.  IMPRESSION:  1.  No extra osseous metastasis within the abdomen or pelvis. 2.  Similar osseous metastasis. 3.  Decreased conspicuity of indeterminate gastropathy ligament nodes. 4.  Uterine fibroid.   Original Report Authenticated By: Jeronimo Greaves, M.D.    ASSESSMENT/PLAN: This is a very pleasant 67 years old Hispanic female with metastatic non-small cell lung cancer, adenocarcinoma with positive EGFR mutation in exon 19. She is currently on treatment with Tarceva 150 mg by mouth daily and tolerating it fairly well. The patient has no evidence for disease progression on her recent scan. The patient was discussed with Dr. Arbutus Ped. She will continue on Tarceva at 150 mg by mouth daily. She will continue with her Xgeva injections every 4 weeks for her bone disease. She'll return in one month for another symptom management visit with a repeat CBC  differential and C. met.  JOHNSON, ADRENA E, PA-C   She was also advised to call immediately if she has any concerning symptoms in the interval. All questions were answered. The patient knows to call the clinic with any problems, questions or concerns. We can certainly see the patient much sooner if necessary.  I spent 20 minutes counseling the patient face to face. The total time spent in the appointment was 30 minutes.

## 2013-02-26 ENCOUNTER — Telehealth: Payer: Self-pay | Admitting: Internal Medicine

## 2013-02-26 ENCOUNTER — Other Ambulatory Visit (HOSPITAL_BASED_OUTPATIENT_CLINIC_OR_DEPARTMENT_OTHER): Payer: 59 | Admitting: Lab

## 2013-02-26 ENCOUNTER — Ambulatory Visit (HOSPITAL_BASED_OUTPATIENT_CLINIC_OR_DEPARTMENT_OTHER): Payer: 59 | Admitting: Physician Assistant

## 2013-02-26 ENCOUNTER — Ambulatory Visit (HOSPITAL_BASED_OUTPATIENT_CLINIC_OR_DEPARTMENT_OTHER): Payer: 59

## 2013-02-26 ENCOUNTER — Ambulatory Visit: Payer: 59

## 2013-02-26 VITALS — BP 142/51 | HR 70 | Temp 97.8°F | Resp 20 | Ht 62.0 in | Wt 161.1 lb

## 2013-02-26 DIAGNOSIS — C7952 Secondary malignant neoplasm of bone marrow: Secondary | ICD-10-CM

## 2013-02-26 DIAGNOSIS — C7951 Secondary malignant neoplasm of bone: Secondary | ICD-10-CM

## 2013-02-26 DIAGNOSIS — C799 Secondary malignant neoplasm of unspecified site: Secondary | ICD-10-CM

## 2013-02-26 DIAGNOSIS — C3430 Malignant neoplasm of lower lobe, unspecified bronchus or lung: Secondary | ICD-10-CM

## 2013-02-26 LAB — CBC WITH DIFFERENTIAL/PLATELET
Eosinophils Absolute: 0.2 10*3/uL (ref 0.0–0.5)
MCV: 87.5 fL (ref 79.5–101.0)
MONO%: 6.2 % (ref 0.0–14.0)
NEUT#: 3.3 10*3/uL (ref 1.5–6.5)
RBC: 3.48 10*6/uL — ABNORMAL LOW (ref 3.70–5.45)
RDW: 14 % (ref 11.2–14.5)
WBC: 5.3 10*3/uL (ref 3.9–10.3)

## 2013-02-26 LAB — COMPREHENSIVE METABOLIC PANEL (CC13)
AST: 20 U/L (ref 5–34)
Albumin: 3 g/dL — ABNORMAL LOW (ref 3.5–5.0)
Alkaline Phosphatase: 35 U/L — ABNORMAL LOW (ref 40–150)
Glucose: 115 mg/dl — ABNORMAL HIGH (ref 70–99)
Potassium: 3.4 mEq/L — ABNORMAL LOW (ref 3.5–5.1)
Sodium: 140 mEq/L (ref 136–145)
Total Protein: 6.4 g/dL (ref 6.4–8.3)

## 2013-02-26 MED ORDER — DENOSUMAB 120 MG/1.7ML ~~LOC~~ SOLN
120.0000 mg | Freq: Once | SUBCUTANEOUS | Status: AC
  Administered 2013-02-26: 120 mg via SUBCUTANEOUS
  Filled 2013-02-26: qty 1.7

## 2013-02-26 NOTE — Patient Instructions (Addendum)
Continue Tarceva 150 mg by mouth daily Continue monthly Xgeva injections Followup with Dr. Arbutus Ped in one month with a restaging CT scan of the chest, abdomen and pelvis to reevaluate your disease

## 2013-02-27 ENCOUNTER — Telehealth: Payer: Self-pay | Admitting: Internal Medicine

## 2013-02-27 NOTE — Telephone Encounter (Signed)
appt made and printed for pt .Marland KitchenMarland Kitchenpt aware cs will contact with ct appt barium given

## 2013-03-06 NOTE — Progress Notes (Signed)
[facility] [phone]   Fax:(336) [phone]  OFFICE PROGRESS NOTE  [name], MD [address] [name] [address]  DIAGNOSIS: Metastatic non-small cell lung cancer, adenocarcinoma with positive EGFR mutation in exon 19 and negative ALK gene translocation diagnosed in July 2013 with bone metastasis.   PRIOR THERAPY: Status post radiotherapy to the left face rib metastasis under the care of Dr. Mitzi Hansen.   CURRENT THERAPY:  1) treatment with Tarceva 150 mg by mouth daily, therapy beginning 07/20/2012. Status post approximately 5 months of therapy  2) Xgeva 120 mg subcutaneously every 4 weeks for bone disease.  INTERVAL HISTORY: Jacqueline Leonard 67 y.o. female returns to the clinic today for routine followup visit.  The patient is feeling fine today with no specific complaints except for mild skin rash. She denied having any significant diarrhea. She has no chest pain, shortness of breath, cough or hemoptysis. She denied having any significant weight loss or night sweats. The patient is tolerating her treatment with Tarceva fairly well. She enjoyed her trip to [place] [employer] [place] and also visited the [place].  MEDICAL HISTORY: Past Medical History  Diagnosis Date  . Lung mass   . Hypertension   . Hyperlipidemia   . Vitamin D deficiency   . Cancer, metastatic to bone 06/19/12    bx=L 5th ribmetastatic Adenocarcinoma with known lung mass  . Radiation 07/11/12-07/24/12    Palliative lung tx 30 gray in 10 fx  . Status post chemoradiation     Tarceva  . External hemorrhoids     ALLERGIES:  is allergic to penicillins.  MEDICATIONS:  Current Outpatient Prescriptions  Medication Sig Dispense Refill  . amLODipine (NORVASC) 5 MG tablet Take 5 mg by mouth daily.      . cholecalciferol (VITAMIN D) 1000 UNITS tablet Take 1,000 Units by mouth daily.      Marland Kitchen erlotinib (TARCEVA) 150 MG tablet Take 1 tablet (150 mg total) by mouth daily. Take on an empty  stomach 1 hour before meals or 2 hours after.  30 tablet  0  . Multiple Minerals-Vitamins (CALCIUM & VIT D3 BONE HEALTH PO) Take 750 mg by mouth 2 (two) times daily.      . polyethylene glycol (MIRALAX / GLYCOLAX) packet Take 17 g by mouth daily as needed.      . simvastatin (ZOCOR) 20 MG tablet       . valsartan-hydrochlorothiazide (DIOVAN-HCT) 160-25 MG per tablet Take 1 tablet by mouth daily.       No current facility-administered medications for this visit.    SURGICAL HISTORY:  Past Surgical History  Procedure Laterality Date  . Soft tissue biopsy  06/19/12    L 5th rib=metastatic adenocarcinoma  . Ovarian cyst removal      REVIEW OF SYSTEMS:  A comprehensive review of systems was negative except for: Mild skin rash on the face   PHYSICAL EXAMINATION: General appearance: alert, cooperative and no distress Head: Normocephalic, without obvious abnormality, atraumatic Neck: no adenopathy Lymph nodes: Cervical, supraclavicular, and axillary nodes normal. Resp: clear to auscultation bilaterally Cardio: regular rate and rhythm, S1, S2 normal, no murmur, click, rub or gallop GI: soft, non-tender; bowel sounds normal; no masses,  no organomegaly Extremities: extremities normal, atraumatic, no cyanosis or edema Neurologic: Alert and oriented X 3, normal strength and tone. Normal symmetric reflexes. Normal coordination and gait  ECOG PERFORMANCE STATUS: 1 - Symptomatic but completely ambulatory  Blood pressure 142/51, pulse 70, temperature 97.8  F (36.6 C), temperature source Oral, resp. rate 20, height 5\' 2"  (1.575 m), weight 161 lb 1.6 oz (73.074 kg).  LABORATORY DATA: Lab Results  Component Value Date   WBC 5.3 02/26/2013   HGB 10.1* 02/26/2013   HCT 30.4* 02/26/2013   MCV 87.5 02/26/2013   PLT 220 02/26/2013      Chemistry      Component Value Date/Time   NA 140 02/26/2013 1437   NA 136 09/23/2012 1525   K 3.4* 02/26/2013 1437   K 4.0 09/23/2012 1525   CL 106 02/26/2013 1437   CL  101 09/23/2012 1525   CO2 29 02/26/2013 1437   CO2 26 09/23/2012 1525   BUN 23.7 02/26/2013 1437   BUN 30* 09/23/2012 1525   CREATININE 0.8 02/26/2013 1437   CREATININE 0.84 09/23/2012 1525      Component Value Date/Time   CALCIUM 8.6 02/26/2013 1437   CALCIUM 8.9 09/23/2012 1525   ALKPHOS 35* 02/26/2013 1437   ALKPHOS 52 09/23/2012 1525   AST 20 02/26/2013 1437   AST 24 09/23/2012 1525   ALT 23 02/26/2013 1437   ALT 31 09/23/2012 1525   BILITOT 0.35 02/26/2013 1437   BILITOT 0.8 09/23/2012 1525       RADIOGRAPHIC STUDIES: Ct Chest W Contrast  12/30/2012  *RADIOLOGY REPORT*  Clinical Data:  Metastatic lung cancer diagnosed 07/13.  Radiation therapy completed 09/13.  Currently on oral chemotherapy.  CT CHEST, ABDOMEN AND PELVIS WITH CONTRAST  Technique: Contiguous axial images of the chest abdomen and pelvis were obtained after IV contrast administration.  Contrast: 100  ml Omnipaque-300  Comparison: 09/27/2012.   CT CHEST  Findings: Lung windows demonstrate perifissural nodules on the right, measuring up to 4 mm (image 21/series 4), unchanged. A 4 mm right lower lobe lung nodule on image 21 is similar.  Pleural-based soft tissue density in the left lower lobe is similar, measuring 2.3 x 1.3 cm on image 23/series 4.  2.4 x 1.2 cm at the same level on the prior.  Mild surrounding probable radiation fibrosis. Left upper lobe calcified granuloma.  Soft tissue windows demonstrate no supraclavicular adenopathy. Tortuous descending thoracic aorta.  Borderline cardiomegaly, without pericardial or pleural effusion.  Minimal left-sided pleural thickening which is likely radiation induced. No central pulmonary embolism, on this non-dedicated study.  Fluid within the thoracic esophagus, including on image 31/series 2.  Similar osseous metastasis.  IMPRESSION:  1.  Similar left lower lobe pulmonary opacity.  Favored to represent treatment effects.  Stable residual tumor cannot be excluded. 2.  Stable scattered pulmonary  nodules.  No new or progressive disease within the chest. 3.  Osseous metastasis. 4. Esophageal air fluid level suggests dysmotility or gastroesophageal reflux.   CT ABDOMEN AND PELVIS  Findings:  Focal steatosis adjacent the falciform ligament.  Normal spleen, stomach, pancreas, gallbladder, biliary tract, adrenal glands, right kidney.  Lower pole left renal cyst of 2.8 cm.  No retroperitoneal or retrocrural adenopathy.  Gastrohepatic ligament nodes are less conspicuous than on the prior exam, and not pathologic by size criteria.  Normal colon and terminal ileum.  Normal small bowel without abdominal ascites.    No evidence of omental or peritoneal disease.  No pelvic adenopathy.  Retroverted uterus, with calcified fibroids within. Normal urinary bladder, without adnexal mass or significant free pelvic fluid.  Bilateral L5 pars defects.  Similar multifocal sclerotic metastasis within the thoracolumbar spine.  Expansile lesion within the 4th lateral left rib is similar.  No complicating  fractures identified. Anterolisthesis of L5 and S1 measures 8 mm and is similar.  IMPRESSION:  1.  No extra osseous metastasis within the abdomen or pelvis. 2.  Similar osseous metastasis. 3.  Decreased conspicuity of indeterminate gastropathy ligament nodes. 4.  Uterine fibroid.   Original Report Authenticated By: Jeronimo Greaves, M.D.    ASSESSMENT/PLAN: This is a very pleasant 67 years old Hispanic female with metastatic non-small cell lung cancer, adenocarcinoma with positive EGFR mutation in exon 19. She is currently on treatment with Tarceva 150 mg by mouth daily and tolerating it fairly well. The patient has no evidence for disease progression on her last scan. The patient was discussed with Dr. Arbutus Ped. She will continue on Tarceva at 150 mg by mouth daily. She will continue with her Xgeva injections every 4 weeks for her bone disease. She'll followup with Dr. Arbutus Ped in one month with a repeat CBC differential, C. met and CT  of the chest, abdomen and pelvis with contrast to reevaluate her disease.   Abdulkadir Emmanuel E, PA-C   She was also advised to call immediately if she has any concerning symptoms in the interval. All questions were answered. The patient knows to call the clinic with any problems, questions or concerns. We can certainly see the patient much sooner if necessary.  I spent 20 minutes counseling the patient face to face. The total time spent in the appointment was 30 minutes.

## 2013-03-21 ENCOUNTER — Ambulatory Visit (HOSPITAL_COMMUNITY)
Admission: RE | Admit: 2013-03-21 | Discharge: 2013-03-21 | Disposition: A | Discharge status: Home / Self Care | Payer: 59 | Source: Ambulatory Visit | Attending: Physician Assistant | Admitting: Physician Assistant

## 2013-03-21 DIAGNOSIS — C7951 Secondary malignant neoplasm of bone: Secondary | ICD-10-CM | POA: Insufficient documentation

## 2013-03-21 DIAGNOSIS — C7952 Secondary malignant neoplasm of bone marrow: Secondary | ICD-10-CM | POA: Insufficient documentation

## 2013-03-21 DIAGNOSIS — C799 Secondary malignant neoplasm of unspecified site: Secondary | ICD-10-CM

## 2013-03-21 DIAGNOSIS — C3490 Malignant neoplasm of unspecified part of unspecified bronchus or lung: Secondary | ICD-10-CM | POA: Insufficient documentation

## 2013-03-21 MED ORDER — IOHEXOL 300 MG/ML  SOLN
100.0000 mL | Freq: Once | INTRAMUSCULAR | Status: AC | PRN
  Administered 2013-03-21: 100 mL via INTRAVENOUS

## 2013-03-26 ENCOUNTER — Other Ambulatory Visit (HOSPITAL_BASED_OUTPATIENT_CLINIC_OR_DEPARTMENT_OTHER): Payer: 59 | Admitting: Lab

## 2013-03-26 ENCOUNTER — Ambulatory Visit (HOSPITAL_BASED_OUTPATIENT_CLINIC_OR_DEPARTMENT_OTHER): Payer: 59

## 2013-03-26 VITALS — BP 128/47 | HR 66 | Temp 98.1°F

## 2013-03-26 DIAGNOSIS — C7951 Secondary malignant neoplasm of bone: Secondary | ICD-10-CM

## 2013-03-26 DIAGNOSIS — C799 Secondary malignant neoplasm of unspecified site: Secondary | ICD-10-CM

## 2013-03-26 DIAGNOSIS — C3430 Malignant neoplasm of lower lobe, unspecified bronchus or lung: Secondary | ICD-10-CM

## 2013-03-26 LAB — COMPREHENSIVE METABOLIC PANEL (CC13)
ALT: 18 U/L (ref 0–55)
Albumin: 3.2 g/dL — ABNORMAL LOW (ref 3.5–5.0)
CO2: 29 mEq/L (ref 22–29)
Calcium: 8.6 mg/dL (ref 8.4–10.4)
Chloride: 105 mEq/L (ref 98–107)
Creatinine: 1.1 mg/dL (ref 0.6–1.1)
Potassium: 3.8 mEq/L (ref 3.5–5.1)
Total Protein: 6.7 g/dL (ref 6.4–8.3)

## 2013-03-26 LAB — CBC WITH DIFFERENTIAL/PLATELET
EOS%: 2.6 % (ref 0.0–7.0)
MCH: 29.8 pg (ref 25.1–34.0)
MCV: 88 fL (ref 79.5–101.0)
MONO%: 8.1 % (ref 0.0–14.0)
NEUT#: 4.9 10*3/uL (ref 1.5–6.5)
RBC: 3.6 10*6/uL — ABNORMAL LOW (ref 3.70–5.45)
RDW: 13.3 % (ref 11.2–14.5)
lymph#: 1.4 10*3/uL (ref 0.9–3.3)

## 2013-03-26 MED ORDER — DENOSUMAB 120 MG/1.7ML ~~LOC~~ SOLN
120.0000 mg | Freq: Once | SUBCUTANEOUS | Status: AC
  Administered 2013-03-26: 120 mg via SUBCUTANEOUS
  Filled 2013-03-26: qty 1.7

## 2013-03-31 ENCOUNTER — Telehealth: Payer: Self-pay | Admitting: Internal Medicine

## 2013-03-31 ENCOUNTER — Ambulatory Visit (HOSPITAL_BASED_OUTPATIENT_CLINIC_OR_DEPARTMENT_OTHER): Payer: 59 | Admitting: Internal Medicine

## 2013-03-31 ENCOUNTER — Encounter: Payer: Self-pay | Admitting: Internal Medicine

## 2013-03-31 VITALS — BP 129/46 | HR 63 | Temp 97.9°F | Resp 17 | Ht 62.0 in | Wt 160.3 lb

## 2013-03-31 DIAGNOSIS — C7951 Secondary malignant neoplasm of bone: Secondary | ICD-10-CM

## 2013-03-31 DIAGNOSIS — C799 Secondary malignant neoplasm of unspecified site: Secondary | ICD-10-CM

## 2013-03-31 DIAGNOSIS — C3430 Malignant neoplasm of lower lobe, unspecified bronchus or lung: Secondary | ICD-10-CM

## 2013-03-31 NOTE — Progress Notes (Signed)
[facility] [phone]   Fax:(336) [phone]  OFFICE PROGRESS NOTE  [name], MD [address] [name] [address]  DIAGNOSIS: Metastatic non-small cell lung cancer, adenocarcinoma with positive EGFR mutation in exon 19 and negative ALK gene translocation diagnosed in July 2013 with bone metastasis.   PRIOR THERAPY: Status post radiotherapy to the left face rib metastasis under the care of Dr. Mitzi Hansen.   CURRENT THERAPY:  1) treatment with Tarceva 150 mg by mouth daily, therapy beginning 07/20/2012. Status post approximately 6 months of therapy.  2) Xgeva 120 mg subcutaneously every 4 weeks for bone disease.  INTERVAL HISTORY: Jacqueline Leonard 67 y.o. female returns to the clinic today for followup visit accompanied by her husband. The patient has no complaints today. She denied having any significant skin rash or diarrhea. She denied having any chest pain, shortness breath, cough or hemoptysis. The patient denied having any significant weight loss or night sweats. She is tolerating her treatment with Tarceva 150 mg by mouth daily fairly well with no significant adverse effects. She has repeat CT scan of the chest, abdomen and pelvis performed recently and she is here for evaluation and discussion of her scan results.  MEDICAL HISTORY: Past Medical History  Diagnosis Date  . Lung mass   . Hypertension   . Hyperlipidemia   . Vitamin D deficiency   . Cancer, metastatic to bone 06/19/12    bx=L 5th ribmetastatic Adenocarcinoma with known lung mass  . Radiation 07/11/12-07/24/12    Palliative lung tx 30 gray in 10 fx  . Status post chemoradiation     Tarceva  . External hemorrhoids     ALLERGIES:  is allergic to penicillins.  MEDICATIONS:  Current Outpatient Prescriptions  Medication Sig Dispense Refill  . amLODipine (NORVASC) 5 MG tablet Take 5 mg by mouth daily.      . cholecalciferol (VITAMIN D) 1000 UNITS tablet Take 1,000 Units by  mouth daily.      Marland Kitchen erlotinib (TARCEVA) 150 MG tablet Take 1 tablet (150 mg total) by mouth daily. Take on an empty stomach 1 hour before meals or 2 hours after.  30 tablet  0  . Multiple Minerals-Vitamins (CALCIUM & VIT D3 BONE HEALTH PO) Take 750 mg by mouth 2 (two) times daily.      . polyethylene glycol (MIRALAX / GLYCOLAX) packet Take 17 g by mouth daily as needed.      . simvastatin (ZOCOR) 20 MG tablet       . valsartan-hydrochlorothiazide (DIOVAN-HCT) 160-25 MG per tablet Take 1 tablet by mouth daily.       No current facility-administered medications for this visit.    SURGICAL HISTORY:  Past Surgical History  Procedure Laterality Date  . Soft tissue biopsy  06/19/12    L 5th rib=metastatic adenocarcinoma  . Ovarian cyst removal      REVIEW OF SYSTEMS:  A comprehensive review of systems was negative.   PHYSICAL EXAMINATION: General appearance: alert, cooperative and no distress Head: Normocephalic, without obvious abnormality, atraumatic Neck: no adenopathy Lymph nodes: Cervical, supraclavicular, and axillary nodes normal. Resp: clear to auscultation bilaterally Cardio: regular rate and rhythm, S1, S2 normal, no murmur, click, rub or gallop GI: soft, non-tender; bowel sounds normal; no masses,  no organomegaly Extremities: extremities normal, atraumatic, no cyanosis or edema Neurologic: Alert and oriented X 3, normal strength and tone. Normal symmetric reflexes. Normal coordination and gait  ECOG PERFORMANCE STATUS: 0 - Asymptomatic  Blood pressure 129/46, pulse 63, temperature 97.9 F (36.6 C), temperature source Oral, resp. rate 17, height 5\' 2"  (1.575 m), weight 160 lb 4.8 oz (72.712 kg).  LABORATORY DATA: Lab Results  Component Value Date   WBC 7.1 03/26/2013   HGB 10.7* 03/26/2013   HCT 31.7* 03/26/2013   MCV 88.0 03/26/2013   PLT 225 03/26/2013      Chemistry      Component Value Date/Time   NA 140 03/26/2013 1506   NA 136 09/23/2012 1525   K 3.8 03/26/2013  1506   K 4.0 09/23/2012 1525   CL 105 03/26/2013 1506   CL 101 09/23/2012 1525   CO2 29 03/26/2013 1506   CO2 26 09/23/2012 1525   BUN 30.1* 03/26/2013 1506   BUN 30* 09/23/2012 1525   CREATININE 1.1 03/26/2013 1506   CREATININE 0.84 09/23/2012 1525      Component Value Date/Time   CALCIUM 8.6 03/26/2013 1506   CALCIUM 8.9 09/23/2012 1525   ALKPHOS 37* 03/26/2013 1506   ALKPHOS 52 09/23/2012 1525   AST 18 03/26/2013 1506   AST 24 09/23/2012 1525   ALT 18 03/26/2013 1506   ALT 31 09/23/2012 1525   BILITOT 0.54 03/26/2013 1506   BILITOT 0.8 09/23/2012 1525       RADIOGRAPHIC STUDIES: Ct Chest W Contrast  03/21/2013  *RADIOLOGY REPORT*  Clinical Data:  Metastatic non-small cell lung cancer.  Restaging.  CT CHEST, ABDOMEN AND PELVIS WITH CONTRAST  Technique:  Multidetector CT imaging of the chest, abdomen and pelvis was performed following the standard protocol during bolus administration of intravenous contrast.  Contrast: OMNIPAQUE IOHEXOL 300 MG/ML  SOLN  Comparison:  02/30/2014   CT CHEST  Findings:  Lungs/pleura: There is no pleural effusion.  Multi focal pulmonary parenchymal nodules are again noted.  Index nodule within the right upper lobe is stable measuring 4 mm, image 24/series six. The right lower lobe nodule is stable measuring 5 mm, image 23/series 6.  Calcified granuloma identified in the left lung. Focal area of radiation change within the medial left lung is again noted. The associated left lower lobe opacity measures 2.0 x 1.3 cm, image 21/series 3.  Previously 2.3 x 1.3 cm.  Heart/Mediastinum: The heart size is normal.  No pericardial effusion.  Right paratracheal lymph node measures 7 mm, image 17/series 3. No adenopathy identified within the mediastinum or hilar region.  Upper abdomen:  Bones/Musculoskeletal:  Review of the visualized osseous structures is positive for multi focal sclerotic bone lesions.  Index lesion within the T12 vertebra measures 1.4 cm, image 56/as  06/00 30. This is unchanged from previous exam.  Vertebral hemangiomas are noted within the to V6 and T9 vertebra.  Expansile lesion with ground glass matrix involving the left fourth rib is unchanged from previous exam, image number 16/series 3.  IMPRESSION:  1.  Similar appearance of left lower lobe pulmonary opacity. 2.  Stable small nodules in the right lung. 3.  No change in bone metastases.   CT ABDOMEN AND PELVIS  Findings:  No suspicious liver abnormality noted.  The gallbladder is normal.  No biliary dilatation.  The pancreas is unremarkable. The spleen appears normal.  The adrenal glands are both unremarkable.  Normal appearance of the right kidney.  Cyst is identified within the lower pole the left kidney.  Urinary bladder is unremarkable.  Calcified fibroid noted within the uterus.  Normal caliber of the abdominal aorta.  There is no aneurysm.  Mild calcified  atherosclerotic change noted.  No enlarged upper abdominal lymph nodes.  There is no pelvic or inguinal adenopathy.  Review of the visualized osseous structures again shows bilateral L5 pars defects.  Anterolisthesis of L5 on S1 measures 1 cm. Stable appearance of sclerotic bone metastases.  IMPRESSION:  1.  No acute findings. 2.  No change and bone metastases.   Original Report Authenticated By: Signa Kell, M.D.    ASSESSMENT: This is a very pleasant 67 years old Hispanic female with metastatic non-small cell lung cancer, adenocarcinoma with positive EGFR mutation in exon 19 and currently on treatment with Tarceva 150 mg by mouth daily status post 6 months of treatment. The patient is tolerating her treatment fairly well with no significant evidence for disease progression on his recent scan.  PLAN: I discussed the scan results with the patient and her husband. I recommended for her to continue treatment was Tarceva with the same dose for now. For bone metastasis, the patient will continue on Xgeva 120 mg subcutaneously every 4 weeks I  would see the patient back for followup visit in one month's for reevaluation and repeat blood work. She was advised to call immediately if she has any concerning symptoms in the interval.  All questions were answered. The patient knows to call the clinic with any problems, questions or concerns. We can certainly see the patient much sooner if necessary.

## 2013-03-31 NOTE — Patient Instructions (Signed)
Your scan showed no evidence for disease progression. Continue treatment with Tarceva 150 mg by mouth daily. Followup visit in one month

## 2013-03-31 NOTE — Telephone Encounter (Signed)
Gave pt appt for labs & MD for June 2014

## 2013-04-23 ENCOUNTER — Ambulatory Visit (HOSPITAL_BASED_OUTPATIENT_CLINIC_OR_DEPARTMENT_OTHER): Payer: 59

## 2013-04-23 VITALS — BP 128/49 | HR 65 | Temp 98.1°F

## 2013-04-23 DIAGNOSIS — C7952 Secondary malignant neoplasm of bone marrow: Secondary | ICD-10-CM

## 2013-04-23 DIAGNOSIS — C3430 Malignant neoplasm of lower lobe, unspecified bronchus or lung: Secondary | ICD-10-CM

## 2013-04-23 DIAGNOSIS — C7951 Secondary malignant neoplasm of bone: Secondary | ICD-10-CM

## 2013-04-23 MED ORDER — DENOSUMAB 120 MG/1.7ML ~~LOC~~ SOLN
120.0000 mg | Freq: Once | SUBCUTANEOUS | Status: AC
  Administered 2013-04-23: 120 mg via SUBCUTANEOUS
  Filled 2013-04-23: qty 1.7

## 2013-05-06 ENCOUNTER — Other Ambulatory Visit (HOSPITAL_BASED_OUTPATIENT_CLINIC_OR_DEPARTMENT_OTHER): Payer: 59 | Admitting: Lab

## 2013-05-06 ENCOUNTER — Ambulatory Visit (HOSPITAL_BASED_OUTPATIENT_CLINIC_OR_DEPARTMENT_OTHER): Payer: 59 | Admitting: Internal Medicine

## 2013-05-06 ENCOUNTER — Encounter: Payer: Self-pay | Admitting: Internal Medicine

## 2013-05-06 VITALS — BP 133/43 | HR 71 | Temp 97.8°F | Resp 18 | Ht 62.0 in | Wt 159.2 lb

## 2013-05-06 DIAGNOSIS — C7951 Secondary malignant neoplasm of bone: Secondary | ICD-10-CM

## 2013-05-06 DIAGNOSIS — C3410 Malignant neoplasm of upper lobe, unspecified bronchus or lung: Secondary | ICD-10-CM

## 2013-05-06 DIAGNOSIS — C7952 Secondary malignant neoplasm of bone marrow: Secondary | ICD-10-CM

## 2013-05-06 DIAGNOSIS — C799 Secondary malignant neoplasm of unspecified site: Secondary | ICD-10-CM

## 2013-05-06 LAB — CBC WITH DIFFERENTIAL/PLATELET
BASO%: 0.3 % (ref 0.0–2.0)
EOS%: 2.7 % (ref 0.0–7.0)
HCT: 32.7 % — ABNORMAL LOW (ref 34.8–46.6)
MCHC: 34.1 g/dL (ref 31.5–36.0)
MONO#: 0.5 10*3/uL (ref 0.1–0.9)
RBC: 3.78 10*6/uL (ref 3.70–5.45)
RDW: 14.1 % (ref 11.2–14.5)
WBC: 6.3 10*3/uL (ref 3.9–10.3)
lymph#: 1.3 10*3/uL (ref 0.9–3.3)

## 2013-05-06 LAB — COMPREHENSIVE METABOLIC PANEL (CC13)
ALT: 20 U/L (ref 0–55)
AST: 19 U/L (ref 5–34)
CO2: 30 mEq/L — ABNORMAL HIGH (ref 22–29)
Calcium: 9 mg/dL (ref 8.4–10.4)
Chloride: 102 mEq/L (ref 98–107)
Potassium: 4 mEq/L (ref 3.5–5.1)
Sodium: 139 mEq/L (ref 136–145)
Total Protein: 6.9 g/dL (ref 6.4–8.3)

## 2013-05-06 NOTE — Patient Instructions (Signed)
Continue treatment with Tarceva. followup visit in one month

## 2013-05-06 NOTE — Progress Notes (Signed)
[facility] Telephone:(336) [phone]   Fax:(336) [phone]  OFFICE PROGRESS NOTE  [name], MD [address] [name] [address]  DIAGNOSIS: Metastatic non-small cell lung cancer, adenocarcinoma with positive EGFR mutation in exon 19 and negative ALK gene translocation diagnosed in July 2013 with bone metastasis.   PRIOR THERAPY: Status post radiotherapy to the left face rib metastasis under the care of Dr. Mitzi Hansen.   CURRENT THERAPY:  1) treatment with Tarceva 150 mg by mouth daily, therapy beginning 07/20/2012. Status post approximately 9 months of therapy.  2) Xgeva 120 mg subcutaneously every 4 weeks for bone disease.   INTERVAL HISTORY: Jacqueline Leonard 67 y.o. female returns to the clinic today for follow up visit accompanied by her husband. The patient is tolerating her treatment was Tarceva fairly well with no significant adverse effects. She denied having any significant skin rash or diarrhea. She denied having any nausea or vomiting. She has no chest pain, shortness breath, cough or hemoptysis.  MEDICAL HISTORY: Past Medical History  Diagnosis Date  . Lung mass   . Hypertension   . Hyperlipidemia   . Vitamin D deficiency   . Cancer, metastatic to bone 06/19/12    bx=L 5th ribmetastatic Adenocarcinoma with known lung mass  . Radiation 07/11/12-07/24/12    Palliative lung tx 30 gray in 10 fx  . Status post chemoradiation     Tarceva  . External hemorrhoids     ALLERGIES:  is allergic to penicillins.  MEDICATIONS:  Current Outpatient Prescriptions  Medication Sig Dispense Refill  . amLODipine (NORVASC) 5 MG tablet Take 5 mg by mouth daily.      . cholecalciferol (VITAMIN D) 1000 UNITS tablet Take 1,000 Units by mouth daily.      Marland Kitchen erlotinib (TARCEVA) 150 MG tablet Take 1 tablet (150 mg total) by mouth daily. Take on an empty stomach 1 hour before meals or 2 hours after.  30 tablet  0  . Multiple Minerals-Vitamins (CALCIUM & VIT D3 BONE  HEALTH PO) Take 750 mg by mouth 2 (two) times daily.      . polyethylene glycol (MIRALAX / GLYCOLAX) packet Take 17 g by mouth daily as needed.      . simvastatin (ZOCOR) 20 MG tablet       . valsartan-hydrochlorothiazide (DIOVAN-HCT) 160-25 MG per tablet Take 1 tablet by mouth daily.       No current facility-administered medications for this visit.    SURGICAL HISTORY:  Past Surgical History  Procedure Laterality Date  . Soft tissue biopsy  06/19/12    L 5th rib=metastatic adenocarcinoma  . Ovarian cyst removal      REVIEW OF SYSTEMS:  A comprehensive review of systems was negative.   PHYSICAL EXAMINATION: General appearance: alert, cooperative and no distress Head: Normocephalic, without obvious abnormality, atraumatic Neck: no adenopathy Lymph nodes: Cervical, supraclavicular, and axillary nodes normal. Resp: clear to auscultation bilaterally Cardio: regular rate and rhythm, S1, S2 normal, no murmur, click, rub or gallop GI: soft, non-tender; bowel sounds normal; no masses,  no organomegaly Extremities: extremities normal, atraumatic, no cyanosis or edema  ECOG PERFORMANCE STATUS: 0 - Asymptomatic  Blood pressure 133/43, pulse 71, temperature 97.8 F (36.6 C), temperature source Oral, resp. rate 18, height 5\' 2"  (1.575 m), weight 159 lb 3.2 oz (72.213 kg).  LABORATORY DATA: Lab Results  Component Value Date   WBC 6.3 05/06/2013   HGB 11.2* 05/06/2013   HCT 32.7* 05/06/2013   MCV  86.5 05/06/2013   PLT 219 05/06/2013      Chemistry      Component Value Date/Time   NA 140 03/26/2013 1506   NA 136 09/23/2012 1525   K 3.8 03/26/2013 1506   K 4.0 09/23/2012 1525   CL 105 03/26/2013 1506   CL 101 09/23/2012 1525   CO2 29 03/26/2013 1506   CO2 26 09/23/2012 1525   BUN 30.1* 03/26/2013 1506   BUN 30* 09/23/2012 1525   CREATININE 1.1 03/26/2013 1506   CREATININE 0.84 09/23/2012 1525      Component Value Date/Time   CALCIUM 8.6 03/26/2013 1506   CALCIUM 8.9 09/23/2012 1525    ALKPHOS 37* 03/26/2013 1506   ALKPHOS 52 09/23/2012 1525   AST 18 03/26/2013 1506   AST 24 09/23/2012 1525   ALT 18 03/26/2013 1506   ALT 31 09/23/2012 1525   BILITOT 0.54 03/26/2013 1506   BILITOT 0.8 09/23/2012 1525       RADIOGRAPHIC STUDIES: No results found.  ASSESSMENT: this is a very pleasant 67 years old Hispanic female with metastatic non-small cell lung cancer, adenocarcinoma with positive EGFR mutation in exon 19 currently on treatment with oral Tarceva status post 9 months and tolerating her treatment fairly well   PLAN: needed for the patient to proceed with current treatment with Tarceva at the same dose 150 mg by mouth daily. I would see her back for follow up visit in one month for reevaluation with repeat CBC and comprehensive metabolic panel She was advised to call immediately if she has any concerning symptoms in the interval.  All questions were answered. The patient knows to call the clinic with any problems, questions or concerns. We can certainly see the patient much sooner if necessary.

## 2013-05-21 ENCOUNTER — Ambulatory Visit (HOSPITAL_BASED_OUTPATIENT_CLINIC_OR_DEPARTMENT_OTHER): Payer: 59

## 2013-05-21 VITALS — BP 139/68 | HR 67 | Temp 99.1°F | Resp 18

## 2013-05-21 DIAGNOSIS — C3430 Malignant neoplasm of lower lobe, unspecified bronchus or lung: Secondary | ICD-10-CM

## 2013-05-21 DIAGNOSIS — C7952 Secondary malignant neoplasm of bone marrow: Secondary | ICD-10-CM

## 2013-05-21 MED ORDER — DENOSUMAB 120 MG/1.7ML ~~LOC~~ SOLN
120.0000 mg | Freq: Once | SUBCUTANEOUS | Status: AC
  Administered 2013-05-21: 120 mg via SUBCUTANEOUS
  Filled 2013-05-21: qty 1.7

## 2013-05-21 NOTE — Patient Instructions (Signed)
Call MD for problems or concerns 

## 2013-06-03 ENCOUNTER — Telehealth: Payer: Self-pay

## 2013-06-03 NOTE — Telephone Encounter (Signed)
Spoke with Angie and gave her home and mobile  phone numbers listed in contact information in EMR.  Also gave contact number of niece listed.

## 2013-06-04 ENCOUNTER — Ambulatory Visit: Payer: 59 | Admitting: Internal Medicine

## 2013-06-04 ENCOUNTER — Other Ambulatory Visit: Payer: 59 | Admitting: Lab

## 2013-06-10 ENCOUNTER — Telehealth: Payer: Self-pay | Admitting: Internal Medicine

## 2013-06-10 ENCOUNTER — Encounter: Payer: Self-pay | Admitting: Internal Medicine

## 2013-06-10 ENCOUNTER — Other Ambulatory Visit (HOSPITAL_BASED_OUTPATIENT_CLINIC_OR_DEPARTMENT_OTHER): Payer: 59 | Admitting: Lab

## 2013-06-10 ENCOUNTER — Ambulatory Visit (HOSPITAL_BASED_OUTPATIENT_CLINIC_OR_DEPARTMENT_OTHER): Payer: 59 | Admitting: Internal Medicine

## 2013-06-10 VITALS — BP 154/64 | HR 70 | Temp 98.2°F | Resp 18 | Ht 62.0 in | Wt 161.6 lb

## 2013-06-10 DIAGNOSIS — C799 Secondary malignant neoplasm of unspecified site: Secondary | ICD-10-CM

## 2013-06-10 DIAGNOSIS — C801 Malignant (primary) neoplasm, unspecified: Secondary | ICD-10-CM

## 2013-06-10 LAB — CBC WITH DIFFERENTIAL/PLATELET
BASO%: 0.4 % (ref 0.0–2.0)
EOS%: 3.3 % (ref 0.0–7.0)
LYMPH%: 20.7 % (ref 14.0–49.7)
MCH: 29.9 pg (ref 25.1–34.0)
MCHC: 33.9 g/dL (ref 31.5–36.0)
MCV: 88.1 fL (ref 79.5–101.0)
MONO%: 8.5 % (ref 0.0–14.0)
Platelets: 224 10*3/uL (ref 145–400)
RBC: 3.59 10*6/uL — ABNORMAL LOW (ref 3.70–5.45)
WBC: 7.1 10*3/uL (ref 3.9–10.3)

## 2013-06-10 LAB — COMPREHENSIVE METABOLIC PANEL (CC13)
ALT: 19 U/L (ref 0–55)
Alkaline Phosphatase: 34 U/L — ABNORMAL LOW (ref 40–150)
Sodium: 139 mEq/L (ref 136–145)
Total Bilirubin: 0.41 mg/dL (ref 0.20–1.20)
Total Protein: 6.7 g/dL (ref 6.4–8.3)

## 2013-06-10 NOTE — Progress Notes (Signed)
[facility] Telephone:(336) [phone]   Fax:([phone]  OFFICE PROGRESS NOTE  [name], MD [address] [name] [address]  DIAGNOSIS: Metastatic non-small cell lung cancer, adenocarcinoma with positive EGFR mutation in exon 19 and negative ALK gene translocation diagnosed in July 2013 with bone metastasis.   PRIOR THERAPY: Status post radiotherapy to the left face rib metastasis under the care of Dr. Mitzi Hansen.   CURRENT THERAPY:  1) treatment with Tarceva 150 mg by mouth daily, therapy beginning 07/20/2012. Status post approximately 10 months of therapy.  2) Xgeva 120 mg subcutaneously every 4 weeks for bone disease.  INTERVAL HISTORY: Jacqueline Leonard 67 y.o. female returns to the clinic today for followup visit. The patient is feeling fine today with no specific complaints. She is tolerating her treatment with Tarceva fairly well with no significant adverse effects. She denied having any significant skin rash or diarrhea. The patient denied having any significant weight loss or night sweats. She denied having any chest pain, shortness breath, cough or hemoptysis.  MEDICAL HISTORY: Past Medical History  Diagnosis Date  . Lung mass   . Hypertension   . Hyperlipidemia   . Vitamin D deficiency   . Cancer, metastatic to bone 06/19/12    bx=L 5th ribmetastatic Adenocarcinoma with known lung mass  . Radiation 07/11/12-07/24/12    Palliative lung tx 30 gray in 10 fx  . Status post chemoradiation     Tarceva  . External hemorrhoids     ALLERGIES:  is allergic to penicillins.  MEDICATIONS:  Current Outpatient Prescriptions  Medication Sig Dispense Refill  . amLODipine (NORVASC) 5 MG tablet Take 5 mg by mouth daily.      . cholecalciferol (VITAMIN D) 1000 UNITS tablet Take 1,000 Units by mouth daily.      Marland Kitchen erlotinib (TARCEVA) 150 MG tablet Take 1 tablet (150 mg total) by mouth daily. Take on an empty stomach 1 hour before meals or 2 hours after.  30  tablet  0  . Multiple Minerals-Vitamins (CALCIUM & VIT D3 BONE HEALTH PO) Take 750 mg by mouth 2 (two) times daily.      . polyethylene glycol (MIRALAX / GLYCOLAX) packet Take 17 g by mouth daily as needed.      . simvastatin (ZOCOR) 20 MG tablet       . valsartan-hydrochlorothiazide (DIOVAN-HCT) 160-25 MG per tablet Take 1 tablet by mouth daily.       No current facility-administered medications for this visit.    SURGICAL HISTORY:  Past Surgical History  Procedure Laterality Date  . Soft tissue biopsy  06/19/12    L 5th rib=metastatic adenocarcinoma  . Ovarian cyst removal      REVIEW OF SYSTEMS:  A comprehensive review of systems was negative.   PHYSICAL EXAMINATION: General appearance: alert, cooperative and no distress Head: Normocephalic, without obvious abnormality, atraumatic Neck: no adenopathy Lymph nodes: Cervical, supraclavicular, and axillary nodes normal. Resp: clear to auscultation bilaterally Cardio: regular rate and rhythm, S1, S2 normal, no murmur, click, rub or gallop GI: soft, non-tender; bowel sounds normal; no masses,  no organomegaly Extremities: extremities normal, atraumatic, no cyanosis or edema  ECOG PERFORMANCE STATUS: 0 - Asymptomatic  Blood pressure 154/64, pulse 70, temperature 98.2 F (36.8 C), temperature source Oral, resp. rate 18, height 5\' 2"  (1.575 m), weight 161 lb 9.6 oz (73.301 kg).  LABORATORY DATA: Lab Results  Component Value Date   WBC 7.1 06/10/2013   HGB 10.7* 06/10/2013  HCT 31.7* 06/10/2013   MCV 88.1 06/10/2013   PLT 224 06/10/2013      Chemistry      Component Value Date/Time   NA 139 05/06/2013 1511   NA 136 09/23/2012 1525   K 4.0 05/06/2013 1511   K 4.0 09/23/2012 1525   CL 102 05/06/2013 1511   CL 101 09/23/2012 1525   CO2 30* 05/06/2013 1511   CO2 26 09/23/2012 1525   BUN 29.2* 05/06/2013 1511   BUN 30* 09/23/2012 1525   CREATININE 1.3* 05/06/2013 1511   CREATININE 0.84 09/23/2012 1525      Component Value  Date/Time   CALCIUM 9.0 05/06/2013 1511   CALCIUM 8.9 09/23/2012 1525   ALKPHOS 37* 05/06/2013 1511   ALKPHOS 52 09/23/2012 1525   AST 19 05/06/2013 1511   AST 24 09/23/2012 1525   ALT 20 05/06/2013 1511   ALT 31 09/23/2012 1525   BILITOT 0.66 05/06/2013 1511   BILITOT 0.8 09/23/2012 1525       RADIOGRAPHIC STUDIES: No results found.  ASSESSMENT AND PLAN: This is a very pleasant 67 years old Hispanic female with metastatic non-small cell lung cancer, adenocarcinoma with positive EGFR mutation in exon 19 currently undergoing treatment with oral Tarceva 150 mg by mouth daily. The patient is tolerating her treatment fairly well with no significant adverse effects. I advised the patient to continue her current treatment with Tarceva. I would see her back for followup visit in one month with repeat CT scan of the chest, abdomen and pelvis for restaging of her disease. The patient will continue on monthly Xgeva for her bone disease. She was advised to call immediately if she has any concerning symptoms in the interval.   All questions were answered. The patient knows to call the clinic with any problems, questions or concerns. We can certainly see the patient much sooner if necessary.

## 2013-06-10 NOTE — Telephone Encounter (Signed)
advised pt on nxt inj and advised her to pick up updated sched.Marland KitchenMarland Kitchen

## 2013-06-10 NOTE — Telephone Encounter (Signed)
gv and printed appt sched and avs forl pt....kpt ok and aware

## 2013-06-10 NOTE — Patient Instructions (Signed)
Continue treatment with Tarceva.  Followup visit in one month with repeat CT scan of the chest, abdomen and pelvis for restaging of your disease.

## 2013-06-18 ENCOUNTER — Ambulatory Visit (HOSPITAL_BASED_OUTPATIENT_CLINIC_OR_DEPARTMENT_OTHER): Payer: 59

## 2013-06-18 VITALS — BP 132/49 | HR 68 | Temp 98.2°F

## 2013-06-18 DIAGNOSIS — C7951 Secondary malignant neoplasm of bone: Secondary | ICD-10-CM

## 2013-06-18 DIAGNOSIS — C799 Secondary malignant neoplasm of unspecified site: Secondary | ICD-10-CM

## 2013-06-18 DIAGNOSIS — C7952 Secondary malignant neoplasm of bone marrow: Secondary | ICD-10-CM

## 2013-06-18 DIAGNOSIS — C3430 Malignant neoplasm of lower lobe, unspecified bronchus or lung: Secondary | ICD-10-CM

## 2013-06-18 MED ORDER — DENOSUMAB 120 MG/1.7ML ~~LOC~~ SOLN
120.0000 mg | Freq: Once | SUBCUTANEOUS | Status: AC
  Administered 2013-06-18: 120 mg via SUBCUTANEOUS
  Filled 2013-06-18: qty 1.7

## 2013-06-27 ENCOUNTER — Encounter: Payer: Self-pay | Admitting: Internal Medicine

## 2013-06-27 NOTE — Progress Notes (Signed)
[employer], [id number], approved tarceva from 05/28/13-06/27/14.

## 2013-07-08 ENCOUNTER — Other Ambulatory Visit (HOSPITAL_BASED_OUTPATIENT_CLINIC_OR_DEPARTMENT_OTHER): Payer: 59

## 2013-07-08 ENCOUNTER — Ambulatory Visit (HOSPITAL_COMMUNITY)
Admission: RE | Admit: 2013-07-08 | Discharge: 2013-07-08 | Disposition: A | Discharge status: Home / Self Care | Payer: 59 | Source: Ambulatory Visit | Attending: Internal Medicine | Admitting: Internal Medicine

## 2013-07-08 ENCOUNTER — Encounter (HOSPITAL_COMMUNITY): Payer: Self-pay

## 2013-07-08 DIAGNOSIS — C7952 Secondary malignant neoplasm of bone marrow: Secondary | ICD-10-CM

## 2013-07-08 DIAGNOSIS — C799 Secondary malignant neoplasm of unspecified site: Secondary | ICD-10-CM

## 2013-07-08 DIAGNOSIS — C3490 Malignant neoplasm of unspecified part of unspecified bronchus or lung: Secondary | ICD-10-CM | POA: Insufficient documentation

## 2013-07-08 DIAGNOSIS — Z79899 Other long term (current) drug therapy: Secondary | ICD-10-CM | POA: Insufficient documentation

## 2013-07-08 DIAGNOSIS — M948X9 Other specified disorders of cartilage, unspecified sites: Secondary | ICD-10-CM | POA: Insufficient documentation

## 2013-07-08 DIAGNOSIS — Z923 Personal history of irradiation: Secondary | ICD-10-CM | POA: Insufficient documentation

## 2013-07-08 DIAGNOSIS — N859 Noninflammatory disorder of uterus, unspecified: Secondary | ICD-10-CM | POA: Insufficient documentation

## 2013-07-08 DIAGNOSIS — C3430 Malignant neoplasm of lower lobe, unspecified bronchus or lung: Secondary | ICD-10-CM

## 2013-07-08 DIAGNOSIS — R911 Solitary pulmonary nodule: Secondary | ICD-10-CM | POA: Insufficient documentation

## 2013-07-08 LAB — CBC WITH DIFFERENTIAL/PLATELET
Basophils Absolute: 0 10*3/uL (ref 0.0–0.1)
HCT: 34.3 % — ABNORMAL LOW (ref 34.8–46.6)
HGB: 11.5 g/dL — ABNORMAL LOW (ref 11.6–15.9)
MONO#: 0.5 10*3/uL (ref 0.1–0.9)
NEUT%: 67.4 % (ref 38.4–76.8)
WBC: 6 10*3/uL (ref 3.9–10.3)
lymph#: 1.2 10*3/uL (ref 0.9–3.3)

## 2013-07-08 LAB — COMPREHENSIVE METABOLIC PANEL (CC13)
BUN: 22.5 mg/dL (ref 7.0–26.0)
CO2: 27 mEq/L (ref 22–29)
Calcium: 9.1 mg/dL (ref 8.4–10.4)
Chloride: 105 mEq/L (ref 98–109)
Creatinine: 0.8 mg/dL (ref 0.6–1.1)

## 2013-07-08 MED ORDER — IOHEXOL 300 MG/ML  SOLN
100.0000 mL | Freq: Once | INTRAMUSCULAR | Status: AC | PRN
  Administered 2013-07-08: 100 mL via INTRAVENOUS

## 2013-07-09 ENCOUNTER — Encounter: Payer: Self-pay | Admitting: Internal Medicine

## 2013-07-09 ENCOUNTER — Telehealth: Payer: Self-pay | Admitting: Internal Medicine

## 2013-07-09 ENCOUNTER — Ambulatory Visit (HOSPITAL_BASED_OUTPATIENT_CLINIC_OR_DEPARTMENT_OTHER): Payer: 59 | Admitting: Internal Medicine

## 2013-07-09 VITALS — BP 136/45 | HR 64 | Temp 98.2°F | Resp 19 | Ht 62.0 in | Wt 158.5 lb

## 2013-07-09 DIAGNOSIS — C799 Secondary malignant neoplasm of unspecified site: Secondary | ICD-10-CM

## 2013-07-09 DIAGNOSIS — C7952 Secondary malignant neoplasm of bone marrow: Secondary | ICD-10-CM

## 2013-07-09 DIAGNOSIS — C3430 Malignant neoplasm of lower lobe, unspecified bronchus or lung: Secondary | ICD-10-CM

## 2013-07-09 NOTE — Progress Notes (Signed)
[facility] Telephone:([phone]   Fax:(336) [phone]  OFFICE PROGRESS NOTE  [name], MD [address] [name] [address]  DIAGNOSIS: Metastatic non-small cell lung cancer, adenocarcinoma with positive EGFR mutation in exon 19 and negative ALK gene translocation diagnosed in July 2013 with bone metastasis.   PRIOR THERAPY: Status post radiotherapy to the left face rib metastasis under the care of Dr. Mitzi Hansen.   CURRENT THERAPY:  1) treatment with Tarceva 150 mg by mouth daily, therapy beginning 07/20/2012. Status post approximately 12 months of therapy.  2) Xgeva 120 mg subcutaneously every 4 weeks for bone disease.  CHEMOTHERAPY INTENT: Palliative  CURRENT # OF CHEMOTHERAPY CYCLES: 12  CURRENT ANTIEMETICS: Compazine  CURRENT SMOKING STATUS: Never smoker  ORAL CHEMOTHERAPY AND CONSENT: Tarceva  CURRENT BISPHOSPHONATES USE: Xgeva  PAIN MANAGEMENT: None  NARCOTICS INDUCED CONSTIPATION: None  LIVING WILL AND CODE STATUS: Full code   INTERVAL HISTORY: Jacqueline Leonard 67 y.o. female returns to the clinic today for followup visit accompanied by her husband. The patient is feeling fine today with no specific complaints. She denied having any significant chest pain, shortness of breath, cough or hemoptysis. She has no weight loss or night sweats. The patient denied having any nausea or vomiting. She denied having any significant diarrhea or skin rash. She had repeat CT scan of the chest, abdomen and pelvis performed recently and she is here for evaluation and discussion of her scan results.  MEDICAL HISTORY: Past Medical History  Diagnosis Date  . Lung mass   . Hypertension   . Hyperlipidemia   . Vitamin D deficiency   . Cancer, metastatic to bone 06/19/12    bx=L 5th ribmetastatic Adenocarcinoma with known lung mass  . Radiation 07/11/12-07/24/12    Palliative lung tx 30 gray in 10 fx  . Status post chemoradiation     Tarceva  . External  hemorrhoids     ALLERGIES:  is allergic to penicillins.  MEDICATIONS:  Current Outpatient Prescriptions  Medication Sig Dispense Refill  . amLODipine (NORVASC) 5 MG tablet Take 5 mg by mouth daily.      . cholecalciferol (VITAMIN D) 1000 UNITS tablet Take 1,000 Units by mouth daily.      Marland Kitchen erlotinib (TARCEVA) 150 MG tablet Take 1 tablet (150 mg total) by mouth daily. Take on an empty stomach 1 hour before meals or 2 hours after.  30 tablet  0  . Multiple Minerals-Vitamins (CALCIUM & VIT D3 BONE HEALTH PO) Take 750 mg by mouth 2 (two) times daily.      . polyethylene glycol (MIRALAX / GLYCOLAX) packet Take 17 g by mouth daily as needed.      . simvastatin (ZOCOR) 20 MG tablet       . valsartan-hydrochlorothiazide (DIOVAN-HCT) 160-25 MG per tablet Take 1 tablet by mouth daily.       No current facility-administered medications for this visit.    SURGICAL HISTORY:  Past Surgical History  Procedure Laterality Date  . Soft tissue biopsy  06/19/12    L 5th rib=metastatic adenocarcinoma  . Ovarian cyst removal      REVIEW OF SYSTEMS:  A comprehensive review of systems was negative.   PHYSICAL EXAMINATION: General appearance: alert, cooperative and no distress Head: Normocephalic, without obvious abnormality, atraumatic Neck: no adenopathy Lymph nodes: Cervical, supraclavicular, and axillary nodes normal. Resp: clear to auscultation bilaterally Cardio: regular rate and rhythm, S1, S2 normal, no murmur, click, rub or gallop GI: soft,  non-tender; bowel sounds normal; no masses,  no organomegaly Extremities: extremities normal, atraumatic, no cyanosis or edema Neurologic: Alert and oriented X 3, normal strength and tone. Normal symmetric reflexes. Normal coordination and gait  ECOG PERFORMANCE STATUS: 0 - Asymptomatic  Blood pressure 136/45, pulse 64, temperature 98.2 F (36.8 C), temperature source Oral, resp. rate 19, height 5\' 2"  (1.575 m), weight 158 lb 8 oz (71.895  kg).  LABORATORY DATA: Lab Results  Component Value Date   WBC 6.0 07/08/2013   HGB 11.5* 07/08/2013   HCT 34.3* 07/08/2013   MCV 87.9 07/08/2013   PLT 235 07/08/2013      Chemistry      Component Value Date/Time   NA 140 07/08/2013 1534   NA 136 09/23/2012 1525   K 3.7 07/08/2013 1534   K 4.0 09/23/2012 1525   CL 102 05/06/2013 1511   CL 101 09/23/2012 1525   CO2 27 07/08/2013 1534   CO2 26 09/23/2012 1525   BUN 22.5 07/08/2013 1534   BUN 30* 09/23/2012 1525   CREATININE 0.8 07/08/2013 1534   CREATININE 0.84 09/23/2012 1525      Component Value Date/Time   CALCIUM 9.1 07/08/2013 1534   CALCIUM 8.9 09/23/2012 1525   ALKPHOS 34* 07/08/2013 1534   ALKPHOS 52 09/23/2012 1525   AST 22 07/08/2013 1534   AST 24 09/23/2012 1525   ALT 19 07/08/2013 1534   ALT 31 09/23/2012 1525   BILITOT 0.73 07/08/2013 1534   BILITOT 0.8 09/23/2012 1525       RADIOGRAPHIC STUDIES: Ct Chest W Contrast  07/08/2013   *RADIOLOGY REPORT*  Clinical Data:  Lung cancer diagnosed 2013.  Radiation therapy complete.  Oral chemotherapy in progress.  CT CHEST, ABDOMEN AND PELVIS WITH CONTRAST  Technique:  Multidetector CT imaging of the chest, abdomen and pelvis was performed following the standard protocol during bolus administration of intravenous contrast.  Contrast: OMNIPAQUE IOHEXOL 300 MG/ML  SOLN  Comparison:  CT 04/25/ 2014    CT CHEST  Findings:  No axillary or supraclavicular lymphadenopathy.  No mediastinal or hilar lymphadenopathy.  No pericardial fluid. Esophagus is normal.  Review of the lung parenchyma demonstrates volume loss in the left hemithorax.  There is atelectasis in the posterior medial aspect of the left lower lobe.  These findings are unchanged from comparison exam.  No new or suspicious pulmonary nodules.  There are several ground-glass nodules in the right lower lobe which are not changed in number or size compared to prior.  Exemplary nodule would include a 4 mm right lower lobe nodule  (image 24).  IMPRESSION:  1.  No evidence of disease progression in the thorax.  2. Stable post therapy change in the left hemithorax.  3.  Small nodules in the right lower lobe are stable.    CT ABDOMEN AND PELVIS  Findings:  No focal hepatic lesion.  Tiny hypodensity in the central right hepatic lobe measures 3 mm (image 43).  This is not changed from comparison exam. The gallbladder, pancreas, spleen, adrenal glands, and kidneys are normal.  The stomach, small bowel, colon unremarkable.  Abdominal aorta normal caliber.  No retroperitoneal periportal lymphadenopathy.  No peritoneal or mesenteric disease.  There is a lobular calcification within the uterus unchanged.  The bladder is normal.  No pelvic lymphadenopathy.  Again demonstrated is a sclerotic lesions at L1-T12 as well as T8. Expansile sclerotic lesion in the left fourth rib is unchanged.  IMPRESSION: 1.  No evidence metastasis within  the soft tissues of the abdomen or pelvis. 2.  Tiny hypodensity in the liver is unchanged. 3.  Calcified leiomyoma within the uterus. 4.  Sclerotic lesions within the thoracic spine and expansile left lateral rib lesion are unchanged.   Original Report Authenticated By: Genevive Bi, M.D.    ASSESSMENT AND PLAN: This is a very pleasant 67 years old Hispanic female with metastatic non-small cell lung cancer, adenocarcinoma with positive EGFR mutation in exon 19 currently undergoing treatment with oral Tarceva 150 mg by mouth daily for the last 12 months. The patient is tolerating her treatment fairly well with no significant adverse effects. The patient has no evidence for disease progression on his recent scan. I discussed the scan results with the patient and her husband. I recommended for her to continue on treatment with Tarceva with the same dose.  She would come back for followup visit in one month's for reevaluation with repeat CBC and comprehensive metabolic panel.  The patient was advised to call  immediately she has any concerning symptoms in the interval please The patient voices understanding of current disease status and treatment options and is in agreement with the current care plan.  All questions were answered. The patient knows to call the clinic with any problems, questions or concerns. We can certainly see the patient much sooner if necessary.  I spent 15 minutes counseling the patient face to face. The total time spent in the appointment was 25 minutes.

## 2013-07-09 NOTE — Patient Instructions (Signed)
CURRENT THERAPY:  1) treatment with Tarceva 150 mg by mouth daily, therapy beginning 07/20/2012. Status post approximately 12 months of therapy.  2) Xgeva 120 mg subcutaneously every 4 weeks for bone disease.  CHEMOTHERAPY INTENT: Palliative  CURRENT # OF CHEMOTHERAPY CYCLES: 12  CURRENT ANTIEMETICS: Compazine  CURRENT SMOKING STATUS: Never smoker  ORAL CHEMOTHERAPY AND CONSENT: Tarceva  CURRENT BISPHOSPHONATES USE: Xgeva  PAIN MANAGEMENT: None  NARCOTICS INDUCED CONSTIPATION: None  LIVING WILL AND CODE STATUS: Full code

## 2013-07-09 NOTE — Telephone Encounter (Signed)
Gave pt appt for lab and MD on September 2014 °

## 2013-07-16 ENCOUNTER — Ambulatory Visit (HOSPITAL_BASED_OUTPATIENT_CLINIC_OR_DEPARTMENT_OTHER): Payer: 59

## 2013-07-16 VITALS — BP 137/56 | HR 70 | Temp 98.2°F

## 2013-07-16 DIAGNOSIS — C799 Secondary malignant neoplasm of unspecified site: Secondary | ICD-10-CM

## 2013-07-16 DIAGNOSIS — C7951 Secondary malignant neoplasm of bone: Secondary | ICD-10-CM

## 2013-07-16 DIAGNOSIS — C3490 Malignant neoplasm of unspecified part of unspecified bronchus or lung: Secondary | ICD-10-CM

## 2013-07-16 MED ORDER — DENOSUMAB 120 MG/1.7ML ~~LOC~~ SOLN
120.0000 mg | Freq: Once | SUBCUTANEOUS | Status: AC
  Administered 2013-07-16: 120 mg via SUBCUTANEOUS
  Filled 2013-07-16: qty 1.7

## 2013-08-07 ENCOUNTER — Telehealth: Payer: Self-pay | Admitting: Internal Medicine

## 2013-08-07 ENCOUNTER — Ambulatory Visit (HOSPITAL_BASED_OUTPATIENT_CLINIC_OR_DEPARTMENT_OTHER): Payer: 59 | Admitting: Physician Assistant

## 2013-08-07 ENCOUNTER — Other Ambulatory Visit (HOSPITAL_BASED_OUTPATIENT_CLINIC_OR_DEPARTMENT_OTHER): Payer: 59 | Admitting: Lab

## 2013-08-07 ENCOUNTER — Encounter: Payer: Self-pay | Admitting: Physician Assistant

## 2013-08-07 VITALS — BP 144/52 | HR 63 | Temp 98.0°F | Resp 18 | Ht 62.0 in | Wt 159.1 lb

## 2013-08-07 DIAGNOSIS — C799 Secondary malignant neoplasm of unspecified site: Secondary | ICD-10-CM

## 2013-08-07 DIAGNOSIS — C3490 Malignant neoplasm of unspecified part of unspecified bronchus or lung: Secondary | ICD-10-CM

## 2013-08-07 LAB — CBC WITH DIFFERENTIAL/PLATELET
BASO%: 0.4 % (ref 0.0–2.0)
Basophils Absolute: 0 10*3/uL (ref 0.0–0.1)
HCT: 31.3 % — ABNORMAL LOW (ref 34.8–46.6)
HGB: 10.4 g/dL — ABNORMAL LOW (ref 11.6–15.9)
MCHC: 33.3 g/dL (ref 31.5–36.0)
MONO#: 0.5 10*3/uL (ref 0.1–0.9)
NEUT%: 68.4 % (ref 38.4–76.8)
WBC: 5.7 10*3/uL (ref 3.9–10.3)
lymph#: 1.1 10*3/uL (ref 0.9–3.3)

## 2013-08-07 LAB — COMPREHENSIVE METABOLIC PANEL (CC13)
ALT: 21 U/L (ref 0–55)
Albumin: 3.2 g/dL — ABNORMAL LOW (ref 3.5–5.0)
CO2: 29 mEq/L (ref 22–29)
Calcium: 8.8 mg/dL (ref 8.4–10.4)
Chloride: 104 mEq/L (ref 98–109)
Creatinine: 0.8 mg/dL (ref 0.6–1.1)
Potassium: 3.8 mEq/L (ref 3.5–5.1)
Total Protein: 6.5 g/dL (ref 6.4–8.3)

## 2013-08-07 NOTE — Telephone Encounter (Signed)
Gave pt appt for lab and MD on 10/8 per ML

## 2013-08-07 NOTE — Progress Notes (Addendum)
[facility] [phone]) [phone]   Fax:(336) [phone]  SHARED VISIT PROGRESS NOTE  [name], MD [address] [name] [address]  DIAGNOSIS: Metastatic non-small cell lung cancer, adenocarcinoma with positive EGFR mutation in exon 19 and negative ALK gene translocation diagnosed in July 2013 with bone metastasis.   PRIOR THERAPY: Status post radiotherapy to the left face rib metastasis under the care of Dr. Mitzi Hansen.   CURRENT THERAPY:  1) treatment with Tarceva 150 mg by mouth daily, therapy beginning 07/20/2012. Status post approximately 13 months of therapy.  2) Xgeva 120 mg subcutaneously every 4 weeks for bone disease.  CHEMOTHERAPY INTENT: Palliative  CURRENT # OF CHEMOTHERAPY CYCLES: 13  CURRENT ANTIEMETICS: Compazine  CURRENT SMOKING STATUS: Never smoker  ORAL CHEMOTHERAPY AND CONSENT: Tarceva  CURRENT BISPHOSPHONATES USE: Xgeva  PAIN MANAGEMENT: None  NARCOTICS INDUCED CONSTIPATION: None  LIVING WILL AND CODE STATUS: Full code   INTERVAL HISTORY: Jacqueline Leonard 67 y.o. female returns to the clinic today for followup visit accompanied.  The patient is feeling fine today with no specific complaints except for dry skin and occasional mild rash from the Tarceva. She also has some occasional extremity cramps but these are fleeting. She denied any diarrhea. She is back to her usual work schedule and is tolerating this without difficulty.  She denied having any significant chest pain, shortness of breath, cough or hemoptysis. She has no weight loss or night sweats. The patient denied having any nausea or vomiting.   MEDICAL HISTORY: Past Medical History  Diagnosis Date  . Lung mass   . Hypertension   . Hyperlipidemia   . Vitamin D deficiency   . Cancer, metastatic to bone 06/19/12    bx=L 5th ribmetastatic Adenocarcinoma with known lung mass  . Radiation 07/11/12-07/24/12    Palliative lung tx 30 gray in 10 fx  . Status post  chemoradiation     Tarceva  . External hemorrhoids     ALLERGIES:  is allergic to penicillins.  MEDICATIONS:  Current Outpatient Prescriptions  Medication Sig Dispense Refill  . amLODipine (NORVASC) 5 MG tablet Take 5 mg by mouth daily.      . cholecalciferol (VITAMIN D) 1000 UNITS tablet Take 1,000 Units by mouth daily.      Marland Kitchen erlotinib (TARCEVA) 150 MG tablet Take 1 tablet (150 mg total) by mouth daily. Take on an empty stomach 1 hour before meals or 2 hours after.  30 tablet  0  . Multiple Minerals-Vitamins (CALCIUM & VIT D3 BONE HEALTH PO) Take 750 mg by mouth 2 (two) times daily.      . polyethylene glycol (MIRALAX / GLYCOLAX) packet Take 17 g by mouth daily as needed.      . simvastatin (ZOCOR) 20 MG tablet       . valsartan-hydrochlorothiazide (DIOVAN-HCT) 160-25 MG per tablet Take 1 tablet by mouth daily.       No current facility-administered medications for this visit.    SURGICAL HISTORY:  Past Surgical History  Procedure Laterality Date  . Soft tissue biopsy  06/19/12    L 5th rib=metastatic adenocarcinoma  . Ovarian cyst removal      REVIEW OF SYSTEMS:  A comprehensive review of systems was negative except for: In generalized dry skin and mild skin rash related to the Tarceva therapy   PHYSICAL EXAMINATION: General appearance: alert, cooperative and no distress Head: Normocephalic, without obvious abnormality, atraumatic Neck: no adenopathy Lymph nodes: Cervical, supraclavicular, and axillary nodes  normal. Resp: clear to auscultation bilaterally Cardio: regular rate and rhythm, S1, S2 normal, no murmur, click, rub or gallop GI: soft, non-tender; bowel sounds normal; no masses,  no organomegaly Extremities: extremities normal, atraumatic, no cyanosis or edema Neurologic: Alert and oriented X 3, normal strength and tone. Normal symmetric reflexes. Normal coordination and gait Skin: Generally dry. No current skin eruptions/rash  ECOG PERFORMANCE STATUS: 0 -  Asymptomatic  Blood pressure 144/52, pulse 63, temperature 98 F (36.7 C), temperature source Oral, resp. rate 18, height 5\' 2"  (1.575 m), weight 159 lb 1.6 oz (72.167 kg).  LABORATORY DATA: Lab Results  Component Value Date   WBC 5.7 08/07/2013   HGB 10.4* 08/07/2013   HCT 31.3* 08/07/2013   MCV 88.9 08/07/2013   PLT 205 08/07/2013      Chemistry      Component Value Date/Time   NA 140 08/07/2013 1443   NA 136 09/23/2012 1525   K 3.8 08/07/2013 1443   K 4.0 09/23/2012 1525   CL 102 05/06/2013 1511   CL 101 09/23/2012 1525   CO2 29 08/07/2013 1443   CO2 26 09/23/2012 1525   BUN 21.3 08/07/2013 1443   BUN 30* 09/23/2012 1525   CREATININE 0.8 08/07/2013 1443   CREATININE 0.84 09/23/2012 1525      Component Value Date/Time   CALCIUM 8.8 08/07/2013 1443   CALCIUM 8.9 09/23/2012 1525   ALKPHOS 34* 08/07/2013 1443   ALKPHOS 52 09/23/2012 1525   AST 20 08/07/2013 1443   AST 24 09/23/2012 1525   ALT 21 08/07/2013 1443   ALT 31 09/23/2012 1525   BILITOT 0.44 08/07/2013 1443   BILITOT 0.8 09/23/2012 1525       RADIOGRAPHIC STUDIES: Ct Chest W Contrast  07/08/2013   *RADIOLOGY REPORT*  Clinical Data:  Lung cancer diagnosed 2013.  Radiation therapy complete.  Oral chemotherapy in progress.  CT CHEST, ABDOMEN AND PELVIS WITH CONTRAST  Technique:  Multidetector CT imaging of the chest, abdomen and pelvis was performed following the standard protocol during bolus administration of intravenous contrast.  Contrast: OMNIPAQUE IOHEXOL 300 MG/ML  SOLN  Comparison:  CT 04/25/ 2014    CT CHEST  Findings:  No axillary or supraclavicular lymphadenopathy.  No mediastinal or hilar lymphadenopathy.  No pericardial fluid. Esophagus is normal.  Review of the lung parenchyma demonstrates volume loss in the left hemithorax.  There is atelectasis in the posterior medial aspect of the left lower lobe.  These findings are unchanged from comparison exam.  No new or suspicious pulmonary nodules.  There are  several ground-glass nodules in the right lower lobe which are not changed in number or size compared to prior.  Exemplary nodule would include a 4 mm right lower lobe nodule (image 24).  IMPRESSION:  1.  No evidence of disease progression in the thorax.  2. Stable post therapy change in the left hemithorax.  3.  Small nodules in the right lower lobe are stable.    CT ABDOMEN AND PELVIS  Findings:  No focal hepatic lesion.  Tiny hypodensity in the central right hepatic lobe measures 3 mm (image 43).  This is not changed from comparison exam. The gallbladder, pancreas, spleen, adrenal glands, and kidneys are normal.  The stomach, small bowel, colon unremarkable.  Abdominal aorta normal caliber.  No retroperitoneal periportal lymphadenopathy.  No peritoneal or mesenteric disease.  There is a lobular calcification within the uterus unchanged.  The bladder is normal.  No pelvic lymphadenopathy.  Again  demonstrated is a sclerotic lesions at L1-T12 as well as T8. Expansile sclerotic lesion in the left fourth rib is unchanged.  IMPRESSION: 1.  No evidence metastasis within the soft tissues of the abdomen or pelvis. 2.  Tiny hypodensity in the liver is unchanged. 3.  Calcified leiomyoma within the uterus. 4.  Sclerotic lesions within the thoracic spine and expansile left lateral rib lesion are unchanged.   Original Report Authenticated By: Genevive Bi, M.D.    ASSESSMENT AND PLAN: This is a very pleasant 67 years old Hispanic female with metastatic non-small cell lung cancer, adenocarcinoma with positive EGFR mutation in exon 19 currently undergoing treatment with oral Tarceva 150 mg by mouth daily for the last 13 months.The patient is tolerating her treatment fairly well with no significant adverse effects.The patient has no evidence for disease progression on his recent scan. Patient was discussed with an also seen by Dr. Arbutus Ped. For her skin dryness she was advised to use a moisturizing the skin lotion for the  body and a facial moisturizer for her face and neck area. She will continue on Tarceva at 150 mg by mouth daily. She'll return in one month for another symptom management visit with a repeat CBC differential and C. met.   Laural Benes, Isabelle Matt E, PA-C   The patient was advised to call immediately she has any concerning symptoms in the interval please The patient voices understanding of current disease status and treatment options and is in agreement with the current care plan.  All questions were answered. The patient knows to call the clinic with any problems, questions or concerns. We can certainly see the patient much sooner if necessary.   ADDENDUM: Hematology/Oncology Attending: I have a face to face encounter with the patient on the day of this visit. I recommended her care plan.  The patient has metastatic non-small cell lung cancer, adenocarcinoma with positive EGFR mutation in exon 19 and currently on treatment with Tarceva 150 mg by mouth daily status post 13 months. She is tolerating her treatment fairly well with very mild skin rash and no significant diarrhea. I advised the patient to continue her current treatment with oral Tarceva at the same dose. She would come back for follow up visit in one month's for reevaluation and repeat CBC and comprehensive metabolic panel.  Lajuana Matte., MD 08/08/2013

## 2013-08-07 NOTE — Patient Instructions (Addendum)
Continue taking Tarceva 150 mg by mouth daily Continue your monthly Xgeva injections Follow up in 1 month

## 2013-08-13 ENCOUNTER — Ambulatory Visit (HOSPITAL_BASED_OUTPATIENT_CLINIC_OR_DEPARTMENT_OTHER): Payer: 59

## 2013-08-13 VITALS — BP 135/49 | HR 64 | Temp 98.3°F

## 2013-08-13 DIAGNOSIS — C3430 Malignant neoplasm of lower lobe, unspecified bronchus or lung: Secondary | ICD-10-CM

## 2013-08-13 DIAGNOSIS — C799 Secondary malignant neoplasm of unspecified site: Secondary | ICD-10-CM

## 2013-08-13 DIAGNOSIS — C7951 Secondary malignant neoplasm of bone: Secondary | ICD-10-CM

## 2013-08-13 MED ORDER — DENOSUMAB 120 MG/1.7ML ~~LOC~~ SOLN
120.0000 mg | Freq: Once | SUBCUTANEOUS | Status: AC
  Administered 2013-08-13: 120 mg via SUBCUTANEOUS
  Filled 2013-08-13: qty 1.7

## 2013-09-03 ENCOUNTER — Other Ambulatory Visit (HOSPITAL_BASED_OUTPATIENT_CLINIC_OR_DEPARTMENT_OTHER): Payer: 59 | Admitting: Lab

## 2013-09-03 ENCOUNTER — Ambulatory Visit (HOSPITAL_BASED_OUTPATIENT_CLINIC_OR_DEPARTMENT_OTHER): Payer: 59 | Admitting: Internal Medicine

## 2013-09-03 ENCOUNTER — Telehealth: Payer: Self-pay | Admitting: Internal Medicine

## 2013-09-03 ENCOUNTER — Encounter: Payer: Self-pay | Admitting: Internal Medicine

## 2013-09-03 VITALS — BP 136/52 | HR 70 | Temp 98.1°F | Resp 18 | Ht 62.0 in | Wt 162.1 lb

## 2013-09-03 DIAGNOSIS — C799 Secondary malignant neoplasm of unspecified site: Secondary | ICD-10-CM

## 2013-09-03 DIAGNOSIS — C3430 Malignant neoplasm of lower lobe, unspecified bronchus or lung: Secondary | ICD-10-CM

## 2013-09-03 DIAGNOSIS — C7951 Secondary malignant neoplasm of bone: Secondary | ICD-10-CM

## 2013-09-03 LAB — COMPREHENSIVE METABOLIC PANEL (CC13)
ALT: 17 U/L (ref 0–55)
AST: 17 U/L (ref 5–34)
Anion Gap: 6 mEq/L (ref 3–11)
CO2: 30 mEq/L — ABNORMAL HIGH (ref 22–29)
Calcium: 9 mg/dL (ref 8.4–10.4)
Chloride: 105 mEq/L (ref 98–109)
Potassium: 3.6 mEq/L (ref 3.5–5.1)
Sodium: 142 mEq/L (ref 136–145)
Total Protein: 6.5 g/dL (ref 6.4–8.3)

## 2013-09-03 LAB — CBC WITH DIFFERENTIAL/PLATELET
BASO%: 0.3 % (ref 0.0–2.0)
EOS%: 2.8 % (ref 0.0–7.0)
HCT: 31.3 % — ABNORMAL LOW (ref 34.8–46.6)
MCHC: 33 g/dL (ref 31.5–36.0)
MONO#: 0.4 10*3/uL (ref 0.1–0.9)
RBC: 3.51 10*6/uL — ABNORMAL LOW (ref 3.70–5.45)
RDW: 13.2 % (ref 11.2–14.5)
WBC: 5.4 10*3/uL (ref 3.9–10.3)
lymph#: 1.2 10*3/uL (ref 0.9–3.3)

## 2013-09-03 NOTE — Telephone Encounter (Signed)
appts made for same day as injection cal avs to pt shh

## 2013-09-03 NOTE — Progress Notes (Signed)
[facility] [phone]   [phone]  OFFICE PROGRESS NOTE  [name], MD [address] [name] [address]  DIAGNOSIS: Metastatic non-small cell lung cancer, adenocarcinoma with positive EGFR mutation in exon 19 and negative ALK gene translocation diagnosed in July 2013 with bone metastasis.   PRIOR THERAPY: Status post radiotherapy to the left face rib metastasis under the care of Dr. Mitzi Hansen.   CURRENT THERAPY:  1) treatment with Tarceva 150 mg by mouth daily, therapy beginning 07/20/2012. Status post approximately 14 months of therapy.  2) Xgeva 120 mg subcutaneously every 4 weeks for bone disease.   CHEMOTHERAPY INTENT: Palliative  CURRENT # OF CHEMOTHERAPY CYCLES: 14  CURRENT ANTIEMETICS: Compazine  CURRENT SMOKING STATUS: Never smoker  ORAL CHEMOTHERAPY AND CONSENT: Tarceva  CURRENT BISPHOSPHONATES USE: Xgeva  PAIN MANAGEMENT: None  NARCOTICS INDUCED CONSTIPATION: None  LIVING WILL AND CODE STATUS: Full code   INTERVAL HISTORY: Jacqueline Leonard 67 y.o. female returns to the clinic today for monthly followup visit. The patient is feeling fine today with no specific complaints. She denied having any significant skin rash or diarrhea. She has no weight loss or night sweats. The patient denied having any significant chest pain, shortness of breath, cough or hemoptysis. She is tolerating her treatment with Tarceva fairly well.   MEDICAL HISTORY: Past Medical History  Diagnosis Date  . Lung mass   . Hypertension   . Hyperlipidemia   . Vitamin D deficiency   . Cancer, metastatic to bone 06/19/12    bx=L 5th ribmetastatic Adenocarcinoma with known lung mass  . Radiation 07/11/12-07/24/12    Palliative lung tx 30 gray in 10 fx  . Status post chemoradiation     Tarceva  . External hemorrhoids     ALLERGIES:  is allergic to penicillins.  MEDICATIONS:  Current Outpatient Prescriptions  Medication Sig Dispense Refill    . amLODipine (NORVASC) 5 MG tablet Take 5 mg by mouth daily.      . cholecalciferol (VITAMIN D) 1000 UNITS tablet Take 1,000 Units by mouth daily.      Marland Kitchen erlotinib (TARCEVA) 150 MG tablet Take 1 tablet (150 mg total) by mouth daily. Take on an empty stomach 1 hour before meals or 2 hours after.  30 tablet  0  . Multiple Minerals-Vitamins (CALCIUM & VIT D3 BONE HEALTH PO) Take 750 mg by mouth 2 (two) times daily.      . polyethylene glycol (MIRALAX / GLYCOLAX) packet Take 17 g by mouth daily as needed.      . simvastatin (ZOCOR) 20 MG tablet       . valsartan-hydrochlorothiazide (DIOVAN-HCT) 160-25 MG per tablet Take 1 tablet by mouth daily.       No current facility-administered medications for this visit.    SURGICAL HISTORY:  Past Surgical History  Procedure Laterality Date  . Soft tissue biopsy  06/19/12    L 5th rib=metastatic adenocarcinoma  . Ovarian cyst removal      REVIEW OF SYSTEMS:  A comprehensive review of systems was negative.   PHYSICAL EXAMINATION: General appearance: alert, cooperative and no distress Head: Normocephalic, without obvious abnormality, atraumatic Neck: no adenopathy, no JVD, supple, symmetrical, trachea midline and thyroid not enlarged, symmetric, no tenderness/mass/nodules Lymph nodes: Cervical, supraclavicular, and axillary nodes normal. Resp: clear to auscultation bilaterally Back: symmetric, no curvature. ROM normal. No CVA tenderness. Cardio: regular rate and rhythm, S1, S2 normal, no murmur, click, rub or gallop GI: soft, non-tender;  bowel sounds normal; no masses,  no organomegaly Extremities: extremities normal, atraumatic, no cyanosis or edema  ECOG PERFORMANCE STATUS: 0 - Asymptomatic  Blood pressure 136/52, pulse 70, temperature 98.1 F (36.7 C), temperature source Oral, resp. rate 18, height 5\' 2"  (1.575 m), weight 162 lb 1.6 oz (73.528 kg), SpO2 100.00%.  LABORATORY DATA: Lab Results  Component Value Date   WBC 5.4 09/03/2013   HGB  10.3* 09/03/2013   HCT 31.3* 09/03/2013   MCV 89.2 09/03/2013   PLT 213 09/03/2013      Chemistry      Component Value Date/Time   NA 140 08/07/2013 1443   NA 136 09/23/2012 1525   K 3.8 08/07/2013 1443   K 4.0 09/23/2012 1525   CL 102 05/06/2013 1511   CL 101 09/23/2012 1525   CO2 29 08/07/2013 1443   CO2 26 09/23/2012 1525   BUN 21.3 08/07/2013 1443   BUN 30* 09/23/2012 1525   CREATININE 0.8 08/07/2013 1443   CREATININE 0.84 09/23/2012 1525      Component Value Date/Time   CALCIUM 8.8 08/07/2013 1443   CALCIUM 8.9 09/23/2012 1525   ALKPHOS 34* 08/07/2013 1443   ALKPHOS 52 09/23/2012 1525   AST 20 08/07/2013 1443   AST 24 09/23/2012 1525   ALT 21 08/07/2013 1443   ALT 31 09/23/2012 1525   BILITOT 0.44 08/07/2013 1443   BILITOT 0.8 09/23/2012 1525       RADIOGRAPHIC STUDIES: No results found.  ASSESSMENT AND PLAN: This is a very pleasant 67 years old Hispanic female with metastatic non-small cell lung cancer, adenocarcinoma currently on treatment with Tarceva 150 mg by mouth daily and tolerating it fairly well. The patient also on Xgeva 120 mg subcutaneously every month for bone disease. She is tolerating her treatment fairly well with no significant adverse effects. I recommended for the patient to proceed with her treatment with Tarceva in [place] as previously scheduled. She will come back for followup visit in one month with repeat CBC and comprehensive metabolic panel. She was advised to call immediately if she has any concerning symptoms in the interval.  The patient voices understanding of current disease status and treatment options and is in agreement with the current care plan.  All questions were answered. The patient knows to call the clinic with any problems, questions or concerns. We can certainly see the patient much sooner if necessary.

## 2013-09-03 NOTE — Patient Instructions (Signed)
CURRENT THERAPY:  1) treatment with Tarceva 150 mg by mouth daily, therapy beginning 07/20/2012. Status post approximately 14 months of therapy.  2) Xgeva 120 mg subcutaneously every 4 weeks for bone disease.   CHEMOTHERAPY INTENT: Palliative  CURRENT # OF CHEMOTHERAPY CYCLES: 14  CURRENT ANTIEMETICS: Compazine  CURRENT SMOKING STATUS: Never smoker  ORAL CHEMOTHERAPY AND CONSENT: Tarceva  CURRENT BISPHOSPHONATES USE: Xgeva  PAIN MANAGEMENT: None  NARCOTICS INDUCED CONSTIPATION: None  LIVING WILL AND CODE STATUS: Full code

## 2013-09-10 ENCOUNTER — Ambulatory Visit (HOSPITAL_BASED_OUTPATIENT_CLINIC_OR_DEPARTMENT_OTHER): Payer: 59

## 2013-09-10 VITALS — BP 128/49 | HR 63 | Temp 98.4°F

## 2013-09-10 DIAGNOSIS — C7951 Secondary malignant neoplasm of bone: Secondary | ICD-10-CM

## 2013-09-10 DIAGNOSIS — C799 Secondary malignant neoplasm of unspecified site: Secondary | ICD-10-CM

## 2013-09-10 DIAGNOSIS — C3430 Malignant neoplasm of lower lobe, unspecified bronchus or lung: Secondary | ICD-10-CM

## 2013-09-10 MED ORDER — DENOSUMAB 120 MG/1.7ML ~~LOC~~ SOLN
120.0000 mg | Freq: Once | SUBCUTANEOUS | Status: AC
  Administered 2013-09-10: 120 mg via SUBCUTANEOUS
  Filled 2013-09-10: qty 1.7

## 2013-10-08 ENCOUNTER — Encounter (INDEPENDENT_AMBULATORY_CARE_PROVIDER_SITE_OTHER): Payer: Self-pay

## 2013-10-08 ENCOUNTER — Ambulatory Visit (HOSPITAL_BASED_OUTPATIENT_CLINIC_OR_DEPARTMENT_OTHER): Payer: 59 | Admitting: Physician Assistant

## 2013-10-08 ENCOUNTER — Other Ambulatory Visit: Payer: Self-pay | Admitting: *Deleted

## 2013-10-08 ENCOUNTER — Telehealth: Payer: Self-pay | Admitting: Internal Medicine

## 2013-10-08 ENCOUNTER — Other Ambulatory Visit (HOSPITAL_BASED_OUTPATIENT_CLINIC_OR_DEPARTMENT_OTHER): Payer: 59 | Admitting: Lab

## 2013-10-08 ENCOUNTER — Ambulatory Visit (HOSPITAL_BASED_OUTPATIENT_CLINIC_OR_DEPARTMENT_OTHER): Payer: 59

## 2013-10-08 ENCOUNTER — Encounter: Payer: Self-pay | Admitting: Physician Assistant

## 2013-10-08 VITALS — BP 137/60 | HR 67 | Temp 96.8°F | Resp 18 | Ht 62.0 in | Wt 160.0 lb

## 2013-10-08 DIAGNOSIS — C3490 Malignant neoplasm of unspecified part of unspecified bronchus or lung: Secondary | ICD-10-CM

## 2013-10-08 DIAGNOSIS — C799 Secondary malignant neoplasm of unspecified site: Secondary | ICD-10-CM

## 2013-10-08 DIAGNOSIS — C7951 Secondary malignant neoplasm of bone: Secondary | ICD-10-CM

## 2013-10-08 DIAGNOSIS — R3915 Urgency of urination: Secondary | ICD-10-CM

## 2013-10-08 LAB — CBC WITH DIFFERENTIAL/PLATELET
Basophils Absolute: 0 10*3/uL (ref 0.0–0.1)
Eosinophils Absolute: 0.2 10*3/uL (ref 0.0–0.5)
LYMPH%: 24 % (ref 14.0–49.7)
MCH: 29.5 pg (ref 25.1–34.0)
MCHC: 33.1 g/dL (ref 31.5–36.0)
MCV: 89.2 fL (ref 79.5–101.0)
RDW: 13.3 % (ref 11.2–14.5)
lymph#: 1.4 10*3/uL (ref 0.9–3.3)

## 2013-10-08 LAB — COMPREHENSIVE METABOLIC PANEL (CC13)
Albumin: 3.4 g/dL — ABNORMAL LOW (ref 3.5–5.0)
Anion Gap: 6 mEq/L (ref 3–11)
CO2: 29 mEq/L (ref 22–29)
Glucose: 101 mg/dl (ref 70–140)
Sodium: 140 mEq/L (ref 136–145)
Total Bilirubin: 0.59 mg/dL (ref 0.20–1.20)
Total Protein: 6.5 g/dL (ref 6.4–8.3)

## 2013-10-08 MED ORDER — DENOSUMAB 120 MG/1.7ML ~~LOC~~ SOLN
120.0000 mg | Freq: Once | SUBCUTANEOUS | Status: AC
  Administered 2013-10-08: 120 mg via SUBCUTANEOUS
  Filled 2013-10-08: qty 1.7

## 2013-10-08 NOTE — Progress Notes (Addendum)
[facility] [phone]   [phone]  SHARED VISIT PROGRESS NOTE  [name], MD [address] [name] [address]  DIAGNOSIS: Metastatic non-small cell lung cancer, adenocarcinoma with positive EGFR mutation in exon 19 and negative ALK gene translocation diagnosed in July 2013 with bone metastasis.   PRIOR THERAPY: Status post radiotherapy to the left face rib metastasis under the care of Dr. Mitzi Hansen.   CURRENT THERAPY:  1) treatment with Tarceva 150 mg by mouth daily, therapy beginning 07/20/2012. Status post approximately 15 months of therapy.  2) Xgeva 120 mg subcutaneously every 4 weeks for bone disease.   CHEMOTHERAPY INTENT: Palliative  CURRENT # OF CHEMOTHERAPY CYCLES: 15  CURRENT ANTIEMETICS: Compazine  CURRENT SMOKING STATUS: Never smoker  ORAL CHEMOTHERAPY AND CONSENT: Tarceva  CURRENT BISPHOSPHONATES USE: Xgeva  PAIN MANAGEMENT: None  NARCOTICS INDUCED CONSTIPATION: None  LIVING WILL AND CODE STATUS: Full code   INTERVAL HISTORY: Jacqueline Leonard 67 y.o. female returns to the clinic today for monthly followup visit. The patient is feeling fine today with no specific complaints. She denied having any significant skin rash or diarrhea. She's been using Cetaphil on her skin with good management of any mild skin rash. She reports that she recently went to [facility] again and had 2 days of diarrhea however these resolved without any sequelae. She complains of some urinary urgency yielding small amounts.. She denied any fever or chills. These symptoms have been present for the past 2 weeks. She reports that she drinks a lot of fluids. She was due for a restaging CT scan of her chest abdomen and pelvis however this is did not get scheduled. She has no weight loss or night sweats. The patient denied having any significant chest pain, shortness of breath, cough or hemoptysis. She is tolerating her treatment with Tarceva fairly  well. She reports that she was just changed or different department at work and is due to be in this Department for the next 2 months or so. 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   MEDICAL HISTORY: Past Medical History  Diagnosis Date  . Lung mass   . Hypertension   . Hyperlipidemia   . Vitamin D deficiency   . Cancer, metastatic to bone 06/19/12    bx=L 5th ribmetastatic Adenocarcinoma with known lung mass  . Radiation 07/11/12-07/24/12    Palliative lung tx 30 gray in 10 fx  . Status post chemoradiation     Tarceva  . External hemorrhoids     ALLERGIES:  is allergic to penicillins.  MEDICATIONS:  Current Outpatient Prescriptions  Medication Sig Dispense Refill  . amLODipine (NORVASC) 5 MG tablet Take 5 mg by mouth daily.      . cholecalciferol (VITAMIN D) 1000 UNITS tablet Take 1,000 Units by mouth daily.      Marland Kitchen erlotinib (TARCEVA) 150 MG tablet Take 1 tablet (150 mg total) by mouth daily. Take on an empty stomach 1 hour before meals or 2 hours after.  30 tablet  0  . Multiple Minerals-Vitamins (CALCIUM & VIT D3 BONE HEALTH PO) Take 750 mg by mouth 2 (two) times daily.      . simvastatin (ZOCOR) 20 MG tablet       . TARCEVA 150 MG tablet Take 150 mg by mouth.      . valsartan-hydrochlorothiazide (  DIOVAN-HCT) 160-25 MG per tablet Take 1 tablet by mouth daily.      . polyethylene glycol (MIRALAX / GLYCOLAX) packet Take 17 g by mouth daily as needed.       No current facility-administered medications for this visit.    SURGICAL HISTORY:  Past Surgical History  Procedure Laterality Date  . Soft tissue biopsy  06/19/12    L 5th rib=metastatic adenocarcinoma  . Ovarian cyst removal      REVIEW OF SYSTEMS:  Respiratory: Respiratory difficulty due to increased dust environment at work   PHYSICAL EXAMINATION: General appearance: alert, cooperative and no  distress Head: Normocephalic, without obvious abnormality, atraumatic Neck: no adenopathy, no JVD, supple, symmetrical, trachea midline and thyroid not enlarged, symmetric, no tenderness/mass/nodules Lymph nodes: Cervical, supraclavicular, and axillary nodes normal. Resp: clear to auscultation bilaterally Back: symmetric, no curvature. ROM normal. No CVA tenderness. Cardio: regular rate and rhythm, S1, S2 normal, no murmur, click, rub or gallop GI: soft, non-tender; bowel sounds normal; no masses,  no organomegaly Extremities: extremities normal, atraumatic, no cyanosis or edema  ECOG PERFORMANCE STATUS: 1 - Symptomatic but completely ambulatory  Blood pressure 137/60, pulse 67, temperature 96.8 F (36 C), temperature source Oral, resp. rate 18, height 5\' 2"  (1.575 m), weight 160 lb (72.576 kg).  LABORATORY DATA: Lab Results  Component Value Date   WBC 5.7 10/08/2013   HGB 10.4* 10/08/2013   HCT 31.5* 10/08/2013   MCV 89.2 10/08/2013   PLT 196 10/08/2013      Chemistry      Component Value Date/Time   NA 140 10/08/2013 1515   NA 136 09/23/2012 1525   K 3.9 10/08/2013 1515   K 4.0 09/23/2012 1525   CL 102 05/06/2013 1511   CL 101 09/23/2012 1525   CO2 29 10/08/2013 1515   CO2 26 09/23/2012 1525   BUN 26.2* 10/08/2013 1515   BUN 30* 09/23/2012 1525   CREATININE 0.8 10/08/2013 1515   CREATININE 0.84 09/23/2012 1525      Component Value Date/Time   CALCIUM 9.3 10/08/2013 1515   CALCIUM 8.9 09/23/2012 1525   ALKPHOS 34* 10/08/2013 1515   ALKPHOS 52 09/23/2012 1525   AST 19 10/08/2013 1515   AST 24 09/23/2012 1525   ALT 18 10/08/2013 1515   ALT 31 09/23/2012 1525   BILITOT 0.59 10/08/2013 1515   BILITOT 0.8 09/23/2012 1525       RADIOGRAPHIC STUDIES: No results found.  ASSESSMENT AND PLAN: This is a very pleasant 67 years old Hispanic female with metastatic non-small cell lung cancer, adenocarcinoma currently on treatment with Tarceva 150 mg by mouth daily and  tolerating it fairly well. The patient also on Xgeva 120 mg subcutaneously every month for bone disease. She is tolerating her treatment fairly well with no significant adverse effects. Patient was discussed with also seen by Dr. Arbutus Ped. She was given a letter for her job requesting that she be removed from the department with the increased dust as this is placing her at Friday your risk for respiratory compromise given her history of lung cancer. We will schedule her to have a restaging CT of her chest, abdomen and pelvis with contrast later this week for reevaluation of her disease. She'll continue on her Tarceva at 150 mg by mouth daily as well as her Xgeva 120 mg subcutaneously monthly. She'll return in one month for another symptom management visit with a repeat CBC differential and C. met. as long as her CT scan remain remain  stable. Should her CT scan reveal evidence for disease progression she'll return sooner for discussion of treatment options.  Laural Benes, Briony Parveen E, PA-C   She was advised to call immediately if she has any concerning symptoms in the interval.  The patient voices understanding of current disease status and treatment options and is in agreement with the current care plan.  All questions were answered. The patient knows to call the clinic with any problems, questions or concerns. We can certainly see the patient much sooner if necessary.  ADDENDUM: Hematology/Oncology Attending: I had the face-to-face encounter with the patient. I recommended a plan.this is a very pleasant 67 years old Hispanic female with metastatic non-small cell lung cancer, adenocarcinoma with positive EGFR mutation in exon 19 and currently undergoing treatment with Tarceva 150 mg by mouth daily. The patient is tolerating her treatment fairly well with no significant adverse effects. She denied having any significant skin rash or diarrhea. I recommended for the patient to have repeat CT scan of the chest,  abdomen and pelvis in the next few days for reevaluation of her disease. She has no evidence for disease progression, she would come back for follow up visit in one month with repeat CBC and comprehensive metabolic panel. She was advised to call immediately if she has any concerning symptoms in the interval. Lajuana Matte., MD 10/11/2013

## 2013-10-08 NOTE — Telephone Encounter (Signed)
Gave pt apt for lab,MD and CT for November and December 2014, gave pt oral contrast

## 2013-10-10 ENCOUNTER — Encounter (HOSPITAL_COMMUNITY): Payer: Self-pay

## 2013-10-10 ENCOUNTER — Other Ambulatory Visit: Payer: 59 | Admitting: Lab

## 2013-10-10 ENCOUNTER — Ambulatory Visit (HOSPITAL_BASED_OUTPATIENT_CLINIC_OR_DEPARTMENT_OTHER): Payer: 59 | Admitting: Lab

## 2013-10-10 ENCOUNTER — Ambulatory Visit (HOSPITAL_COMMUNITY)
Admission: RE | Admit: 2013-10-10 | Discharge: 2013-10-10 | Disposition: A | Discharge status: Home / Self Care | Payer: 59 | Source: Ambulatory Visit | Attending: Physician Assistant | Admitting: Physician Assistant

## 2013-10-10 DIAGNOSIS — R3915 Urgency of urination: Secondary | ICD-10-CM

## 2013-10-10 DIAGNOSIS — Z923 Personal history of irradiation: Secondary | ICD-10-CM | POA: Insufficient documentation

## 2013-10-10 DIAGNOSIS — C799 Secondary malignant neoplasm of unspecified site: Secondary | ICD-10-CM

## 2013-10-10 DIAGNOSIS — Z79899 Other long term (current) drug therapy: Secondary | ICD-10-CM | POA: Insufficient documentation

## 2013-10-10 DIAGNOSIS — C3490 Malignant neoplasm of unspecified part of unspecified bronchus or lung: Secondary | ICD-10-CM | POA: Insufficient documentation

## 2013-10-10 DIAGNOSIS — C7951 Secondary malignant neoplasm of bone: Secondary | ICD-10-CM | POA: Insufficient documentation

## 2013-10-10 LAB — URINALYSIS, MICROSCOPIC - CHCC
Ketones: NEGATIVE mg/dL
Leukocyte Esterase: NEGATIVE
Protein: NEGATIVE mg/dL
RBC / HPF: NEGATIVE (ref 0–2)
Urobilinogen, UR: 0.2 mg/dL (ref 0.2–1)

## 2013-10-10 MED ORDER — IOHEXOL 300 MG/ML  SOLN
100.0000 mL | Freq: Once | INTRAMUSCULAR | Status: AC | PRN
  Administered 2013-10-10: 100 mL via INTRAVENOUS

## 2013-10-10 NOTE — Patient Instructions (Signed)
Continue taking Tarceva 150 mg by mouth daily Continue with your monthly injections of Xgeva for your bone disease Keep your appointment later this week for your restaging CT scan and call on Monday for the results Followup in one month

## 2013-10-11 LAB — URINE CULTURE

## 2013-10-13 ENCOUNTER — Telehealth: Payer: Self-pay | Admitting: *Deleted

## 2013-10-13 NOTE — Telephone Encounter (Signed)
Per Dr Donnald Garre, CT scan stable.  Called and informed pt.  SLJ

## 2013-11-05 ENCOUNTER — Ambulatory Visit (HOSPITAL_BASED_OUTPATIENT_CLINIC_OR_DEPARTMENT_OTHER): Payer: 59

## 2013-11-05 ENCOUNTER — Other Ambulatory Visit (HOSPITAL_BASED_OUTPATIENT_CLINIC_OR_DEPARTMENT_OTHER): Payer: 59

## 2013-11-05 ENCOUNTER — Encounter: Payer: Self-pay | Admitting: Internal Medicine

## 2013-11-05 ENCOUNTER — Telehealth: Payer: Self-pay | Admitting: Internal Medicine

## 2013-11-05 ENCOUNTER — Ambulatory Visit (HOSPITAL_BASED_OUTPATIENT_CLINIC_OR_DEPARTMENT_OTHER): Payer: 59 | Admitting: Internal Medicine

## 2013-11-05 VITALS — BP 137/59 | HR 70 | Temp 97.1°F | Resp 18 | Ht 62.0 in | Wt 159.5 lb

## 2013-11-05 DIAGNOSIS — C799 Secondary malignant neoplasm of unspecified site: Secondary | ICD-10-CM

## 2013-11-05 DIAGNOSIS — C7951 Secondary malignant neoplasm of bone: Secondary | ICD-10-CM

## 2013-11-05 DIAGNOSIS — C3430 Malignant neoplasm of lower lobe, unspecified bronchus or lung: Secondary | ICD-10-CM

## 2013-11-05 LAB — CBC WITH DIFFERENTIAL/PLATELET
BASO%: 0.3 % (ref 0.0–2.0)
LYMPH%: 20.6 % (ref 14.0–49.7)
MCHC: 33.5 g/dL (ref 31.5–36.0)
MCV: 90.2 fL (ref 79.5–101.0)
MONO%: 7.2 % (ref 0.0–14.0)
Platelets: 213 10*3/uL (ref 145–400)
RBC: 3.78 10*6/uL (ref 3.70–5.45)

## 2013-11-05 LAB — COMPREHENSIVE METABOLIC PANEL (CC13)
ALT: 28 U/L (ref 0–55)
Alkaline Phosphatase: 39 U/L — ABNORMAL LOW (ref 40–150)
Glucose: 98 mg/dl (ref 70–140)
Sodium: 142 mEq/L (ref 136–145)
Total Bilirubin: 0.67 mg/dL (ref 0.20–1.20)
Total Protein: 7.2 g/dL (ref 6.4–8.3)

## 2013-11-05 MED ORDER — DENOSUMAB 120 MG/1.7ML ~~LOC~~ SOLN
120.0000 mg | Freq: Once | SUBCUTANEOUS | Status: AC
  Administered 2013-11-05: 120 mg via SUBCUTANEOUS
  Filled 2013-11-05: qty 1.7

## 2013-11-05 NOTE — Progress Notes (Signed)
[facility] [phone]   Fax:(336) [phone]  OFFICE PROGRESS NOTE  [name], MD [address] [name] [address]  DIAGNOSIS: Metastatic non-small cell lung cancer, adenocarcinoma with positive EGFR mutation in exon 19 and negative ALK gene translocation diagnosed in July 2013 with bone metastasis.   PRIOR THERAPY: Status post radiotherapy to the left face rib metastasis under the care of Dr. Mitzi Hansen.   CURRENT THERAPY:  1) treatment with Tarceva 150 mg by mouth daily, therapy beginning 07/20/2012. Status post approximately 15 months of therapy.  2) Xgeva 120 mg subcutaneously every 4 weeks for bone disease.   CHEMOTHERAPY INTENT: Palliative  CURRENT # OF CHEMOTHERAPY CYCLES: 15  CURRENT ANTIEMETICS: Compazine  CURRENT SMOKING STATUS: Never smoker  ORAL CHEMOTHERAPY AND CONSENT: Tarceva  CURRENT BISPHOSPHONATES USE: Xgeva  PAIN MANAGEMENT: None  NARCOTICS INDUCED CONSTIPATION: None  LIVING WILL AND CODE STATUS: Full code  INTERVAL HISTORY: Jacqueline Leonard 67 y.o. female returns to the clinic today for monthly followup visit. The patient is feeling fine today with no specific complaints. She denied having any significant skin rash or diarrhea. She has no weight loss or night sweats. The patient denied having any significant chest pain, shortness of breath, cough or hemoptysis. She is tolerating her treatment with Tarceva fairly well. She has mild pain in the right arm started 2 weeks ago. She did not take any medication for it. She thinks her pain is more in the muscle than bone.  MEDICAL HISTORY: Past Medical History  Diagnosis Date  . Lung mass   . Hypertension   . Hyperlipidemia   . Vitamin D deficiency   . Cancer, metastatic to bone 06/19/12    bx=L 5th ribmetastatic Adenocarcinoma with known lung mass  . Radiation 07/11/12-07/24/12    Palliative lung tx 30 gray in 10 fx  . Status post chemoradiation     Tarceva  .  External hemorrhoids     ALLERGIES:  is allergic to penicillins.  MEDICATIONS:  Current Outpatient Prescriptions  Medication Sig Dispense Refill  . amLODipine (NORVASC) 5 MG tablet Take 5 mg by mouth daily.      . cholecalciferol (VITAMIN D) 1000 UNITS tablet Take 1,000 Units by mouth daily.      Marland Kitchen erlotinib (TARCEVA) 150 MG tablet Take 1 tablet (150 mg total) by mouth daily. Take on an empty stomach 1 hour before meals or 2 hours after.  30 tablet  0  . Multiple Minerals-Vitamins (CALCIUM & VIT D3 BONE HEALTH PO) Take 750 mg by mouth 2 (two) times daily.      . polyethylene glycol (MIRALAX / GLYCOLAX) packet Take 17 g by mouth daily as needed.      . simvastatin (ZOCOR) 20 MG tablet       . valsartan-hydrochlorothiazide (DIOVAN-HCT) 160-25 MG per tablet Take 1 tablet by mouth daily.       No current facility-administered medications for this visit.    SURGICAL HISTORY:  Past Surgical History  Procedure Laterality Date  . Soft tissue biopsy  06/19/12    L 5th rib=metastatic adenocarcinoma  . Ovarian cyst removal      REVIEW OF SYSTEMS:  A comprehensive review of systems was negative.   PHYSICAL EXAMINATION: General appearance: alert, cooperative and no distress Head: Normocephalic, without obvious abnormality, atraumatic Neck: no adenopathy, no JVD, supple, symmetrical, trachea midline and thyroid not enlarged, symmetric, no tenderness/mass/nodules Lymph nodes: Cervical, supraclavicular, and axillary nodes normal. Resp: clear to  auscultation bilaterally Back: symmetric, no curvature. ROM normal. No CVA tenderness. Cardio: regular rate and rhythm, S1, S2 normal, no murmur, click, rub or gallop GI: soft, non-tender; bowel sounds normal; no masses,  no organomegaly Extremities: extremities normal, atraumatic, no cyanosis or edema  ECOG PERFORMANCE STATUS: 0 - Asymptomatic  Blood pressure 137/59, pulse 70, temperature 97.1 F (36.2 C), temperature source Oral, resp. rate 18,  height 5\' 2"  (1.575 m), weight 159 lb 8 oz (72.349 kg), SpO2 98.00%.  LABORATORY DATA: Lab Results  Component Value Date   WBC 6.3 11/05/2013   HGB 11.4* 11/05/2013   HCT 34.1* 11/05/2013   MCV 90.2 11/05/2013   PLT 213 11/05/2013      Chemistry      Component Value Date/Time   NA 140 10/08/2013 1515   NA 136 09/23/2012 1525   K 3.9 10/08/2013 1515   K 4.0 09/23/2012 1525   CL 102 05/06/2013 1511   CL 101 09/23/2012 1525   CO2 29 10/08/2013 1515   CO2 26 09/23/2012 1525   BUN 26.2* 10/08/2013 1515   BUN 30* 09/23/2012 1525   CREATININE 0.8 10/08/2013 1515   CREATININE 0.84 09/23/2012 1525      Component Value Date/Time   CALCIUM 9.3 10/08/2013 1515   CALCIUM 8.9 09/23/2012 1525   ALKPHOS 34* 10/08/2013 1515   ALKPHOS 52 09/23/2012 1525   AST 19 10/08/2013 1515   AST 24 09/23/2012 1525   ALT 18 10/08/2013 1515   ALT 31 09/23/2012 1525   BILITOT 0.59 10/08/2013 1515   BILITOT 0.8 09/23/2012 1525       RADIOGRAPHIC STUDIES: No results found.  ASSESSMENT AND PLAN: This is a very pleasant 67 years old Hispanic female with metastatic non-small cell lung cancer, adenocarcinoma currently on treatment with Tarceva 150 mg by mouth daily and tolerating it fairly well. The patient also on Xgeva 120 mg subcutaneously every month for bone disease. She is tolerating her treatment fairly well with no significant adverse effects. I recommended for the patient to proceed with her treatment with Tarceva in [place] as previously scheduled. She will come back for followup visit in one month with repeat CBC and comprehensive metabolic panel. She was advised to call immediately if she has any concerning symptoms in the interval.  The patient voices understanding of current disease status and treatment options and is in agreement with the current care plan.  All questions were answered. The patient knows to call the clinic with any problems, questions or concerns. We can certainly see the  patient much sooner if necessary.

## 2013-11-05 NOTE — Telephone Encounter (Signed)
gv adn printed appt sched and avs for pt for Jan ..Marland Kitchenpt wanted second week in Jan 2015

## 2013-11-05 NOTE — Patient Instructions (Signed)
CURRENT THERAPY:  1) treatment with Tarceva 150 mg by mouth daily, therapy beginning 07/20/2012. Status post approximately 15 months of therapy.  2) Xgeva 120 mg subcutaneously every 4 weeks for bone disease.  CHEMOTHERAPY INTENT: Palliative  CURRENT # OF CHEMOTHERAPY CYCLES: 15  CURRENT ANTIEMETICS: Compazine  CURRENT SMOKING STATUS: Never smoker  ORAL CHEMOTHERAPY AND CONSENT: Tarceva  CURRENT BISPHOSPHONATES USE: Xgeva  PAIN MANAGEMENT: None  NARCOTICS INDUCED CONSTIPATION: None  LIVING WILL AND CODE STATUS: Full code

## 2013-11-10 ENCOUNTER — Telehealth: Payer: Self-pay | Admitting: *Deleted

## 2013-11-10 NOTE — Telephone Encounter (Signed)
Left message for patient to call back regarding lab results.

## 2013-11-10 NOTE — Telephone Encounter (Signed)
Message copied by Raphael Gibney on Mon Nov 10, 2013 11:40 AM ------      Message from: Conni Slipper      Created: Mon Nov 10, 2013 11:28 AM       Abnormal results, please call and notify patient to increase her intake of potassium rich foods ------

## 2013-11-10 NOTE — Telephone Encounter (Signed)
Called patient regarding lab results.  Instructed patient to increase potassium intake (banana's, green leafy vegetable, etc.).  Per Tiana Loft, PA.  Patient verbalized understanding.

## 2013-12-03 ENCOUNTER — Ambulatory Visit: Payer: 59

## 2013-12-09 ENCOUNTER — Other Ambulatory Visit: Payer: Self-pay | Admitting: Internal Medicine

## 2013-12-09 ENCOUNTER — Other Ambulatory Visit: Payer: Self-pay | Admitting: *Deleted

## 2013-12-10 ENCOUNTER — Telehealth: Payer: Self-pay | Admitting: Internal Medicine

## 2013-12-10 ENCOUNTER — Encounter: Payer: Self-pay | Admitting: Physician Assistant

## 2013-12-10 ENCOUNTER — Other Ambulatory Visit (HOSPITAL_BASED_OUTPATIENT_CLINIC_OR_DEPARTMENT_OTHER): Payer: 59

## 2013-12-10 ENCOUNTER — Encounter (INDEPENDENT_AMBULATORY_CARE_PROVIDER_SITE_OTHER): Payer: Self-pay

## 2013-12-10 ENCOUNTER — Ambulatory Visit (HOSPITAL_BASED_OUTPATIENT_CLINIC_OR_DEPARTMENT_OTHER): Payer: 59

## 2013-12-10 ENCOUNTER — Ambulatory Visit (HOSPITAL_BASED_OUTPATIENT_CLINIC_OR_DEPARTMENT_OTHER): Payer: 59 | Admitting: Physician Assistant

## 2013-12-10 VITALS — BP 134/43 | HR 64 | Temp 97.6°F | Resp 18 | Ht 62.0 in | Wt 161.2 lb

## 2013-12-10 DIAGNOSIS — C7952 Secondary malignant neoplasm of bone marrow: Secondary | ICD-10-CM

## 2013-12-10 DIAGNOSIS — C7951 Secondary malignant neoplasm of bone: Secondary | ICD-10-CM

## 2013-12-10 DIAGNOSIS — C3490 Malignant neoplasm of unspecified part of unspecified bronchus or lung: Secondary | ICD-10-CM

## 2013-12-10 DIAGNOSIS — C799 Secondary malignant neoplasm of unspecified site: Secondary | ICD-10-CM

## 2013-12-10 DIAGNOSIS — C3430 Malignant neoplasm of lower lobe, unspecified bronchus or lung: Secondary | ICD-10-CM

## 2013-12-10 DIAGNOSIS — C801 Malignant (primary) neoplasm, unspecified: Secondary | ICD-10-CM

## 2013-12-10 LAB — CBC WITH DIFFERENTIAL/PLATELET
BASO%: 0.4 % (ref 0.0–2.0)
Basophils Absolute: 0 10*3/uL (ref 0.0–0.1)
EOS ABS: 0.2 10*3/uL (ref 0.0–0.5)
EOS%: 3.2 % (ref 0.0–7.0)
HCT: 33.2 % — ABNORMAL LOW (ref 34.8–46.6)
HGB: 11.1 g/dL — ABNORMAL LOW (ref 11.6–15.9)
LYMPH#: 1.4 10*3/uL (ref 0.9–3.3)
LYMPH%: 21.7 % (ref 14.0–49.7)
MCH: 30.2 pg (ref 25.1–34.0)
MCHC: 33.5 g/dL (ref 31.5–36.0)
MCV: 90.3 fL (ref 79.5–101.0)
MONO#: 0.6 10*3/uL (ref 0.1–0.9)
MONO%: 8.4 % (ref 0.0–14.0)
NEUT%: 66.3 % (ref 38.4–76.8)
NEUTROS ABS: 4.3 10*3/uL (ref 1.5–6.5)
Platelets: 217 10*3/uL (ref 145–400)
RBC: 3.68 10*6/uL — AB (ref 3.70–5.45)
RDW: 13.5 % (ref 11.2–14.5)
WBC: 6.5 10*3/uL (ref 3.9–10.3)

## 2013-12-10 LAB — COMPREHENSIVE METABOLIC PANEL (CC13)
ALBUMIN: 3.5 g/dL (ref 3.5–5.0)
ALK PHOS: 37 U/L — AB (ref 40–150)
ALT: 20 U/L (ref 0–55)
AST: 18 U/L (ref 5–34)
Anion Gap: 8 mEq/L (ref 3–11)
BUN: 26.1 mg/dL — ABNORMAL HIGH (ref 7.0–26.0)
CHLORIDE: 105 meq/L (ref 98–109)
CO2: 29 mEq/L (ref 22–29)
Calcium: 9.2 mg/dL (ref 8.4–10.4)
Creatinine: 0.8 mg/dL (ref 0.6–1.1)
Glucose: 117 mg/dl (ref 70–140)
POTASSIUM: 3.7 meq/L (ref 3.5–5.1)
SODIUM: 142 meq/L (ref 136–145)
TOTAL PROTEIN: 6.8 g/dL (ref 6.4–8.3)
Total Bilirubin: 0.64 mg/dL (ref 0.20–1.20)

## 2013-12-10 MED ORDER — DENOSUMAB 120 MG/1.7ML ~~LOC~~ SOLN
120.0000 mg | Freq: Once | SUBCUTANEOUS | Status: AC
  Administered 2013-12-10: 120 mg via SUBCUTANEOUS
  Filled 2013-12-10: qty 1.7

## 2013-12-10 NOTE — Telephone Encounter (Signed)
gv adn printed appt sched and avs for pt for Feb...gv pt barium

## 2013-12-10 NOTE — Progress Notes (Addendum)
[facility] Telephone:(336) [phone]   Fax:(336) [phone]  SHARED VISIT PROGRESS NOTE  [name], MD [place] [address]  DIAGNOSIS: Metastatic non-small cell lung cancer, adenocarcinoma with positive EGFR mutation in exon 19 and negative ALK gene translocation diagnosed in July 2013 with bone metastasis.   PRIOR THERAPY: Status post radiotherapy to the left face rib metastasis under the care of Dr. Lisbeth Renshaw.   CURRENT THERAPY:  1) treatment with Tarceva 150 mg by mouth daily, therapy beginning 07/20/2012. Status post approximately 16 months of therapy.  2) Xgeva 120 mg subcutaneously every 4 weeks for bone disease.   CHEMOTHERAPY INTENT: Palliative  CURRENT # OF CHEMOTHERAPY CYCLES: 16  CURRENT ANTIEMETICS: Compazine  CURRENT SMOKING STATUS: Never smoker  ORAL CHEMOTHERAPY AND CONSENT: Tarceva  CURRENT BISPHOSPHONATES USE: Xgeva  PAIN MANAGEMENT: None  NARCOTICS INDUCED CONSTIPATION: None  LIVING WILL AND CODE STATUS: Full code  INTERVAL HISTORY: Jacqueline Leonard 68 y.o. female returns to the clinic today for monthly followup visit. The patient is feeling fine today with no specific complaints except for fatigue. She requests a handicap parking permit and she finds it difficult to walk from where she has to park her car into her job daily. She denied having any significant skin rash or diarrhea. She has no weight loss or night sweats. The patient denied having any significant chest pain, shortness of breath, cough or hemoptysis. She is tolerating her treatment with Tarceva fairly well.  MEDICAL HISTORY: Past Medical History  Diagnosis Date  . Lung mass   . Hypertension   . Hyperlipidemia   . Vitamin D deficiency   . Cancer, metastatic to bone 06/19/12    bx=L 5th ribmetastatic Adenocarcinoma with known lung mass  . Radiation 07/11/12-07/24/12    Palliative lung tx 30 gray in 10 fx  . Status post chemoradiation     Tarceva  .  External hemorrhoids     ALLERGIES:  is allergic to penicillins.  MEDICATIONS:  Current Outpatient Prescriptions  Medication Sig Dispense Refill  . amLODipine (NORVASC) 5 MG tablet Take 5 mg by mouth daily.      . cholecalciferol (VITAMIN D) 1000 UNITS tablet Take 1,000 Units by mouth daily.      Marland Kitchen erlotinib (TARCEVA) 150 MG tablet Take 1 tablet (150 mg total) by mouth daily. Take on an empty stomach 1 hour before meals or 2 hours after.  30 tablet  0  . Multiple Minerals-Vitamins (CALCIUM & VIT D3 BONE HEALTH PO) Take 750 mg by mouth 2 (two) times daily.      . simvastatin (ZOCOR) 20 MG tablet       . valsartan-hydrochlorothiazide (DIOVAN-HCT) 160-25 MG per tablet Take 1 tablet by mouth daily.      . polyethylene glycol (MIRALAX / GLYCOLAX) packet Take 17 g by mouth daily as needed.       No current facility-administered medications for this visit.    SURGICAL HISTORY:  Past Surgical History  Procedure Laterality Date  . Soft tissue biopsy  06/19/12    L 5th rib=metastatic adenocarcinoma  . Ovarian cyst removal      REVIEW OF SYSTEMS:  A comprehensive review of systems was negative except for: Constitutional: positive for fatigue   PHYSICAL EXAMINATION: General appearance: alert, cooperative and no distress Head: Normocephalic, without obvious abnormality, atraumatic Neck: no adenopathy, no JVD, supple, symmetrical, trachea midline and thyroid not enlarged, symmetric, no tenderness/mass/nodules Lymph nodes: Cervical, supraclavicular, and axillary nodes  normal. Resp: clear to auscultation bilaterally Back: symmetric, no curvature. ROM normal. No CVA tenderness. Cardio: regular rate and rhythm, S1, S2 normal, no murmur, click, rub or gallop GI: soft, non-tender; bowel sounds normal; no masses,  no organomegaly Extremities: extremities normal, atraumatic, no cyanosis or edema  ECOG PERFORMANCE STATUS: 0 - Asymptomatic  Blood pressure 134/43, pulse 64, temperature 97.6 F (36.4  C), temperature source Oral, resp. rate 18, height _0  (1.575 m), weight 161 lb 3.2 oz (73.12 kg).  LABORATORY DATA: Lab Results  Component Value Date   WBC 6.5 12/10/2013   HGB 11.1* 12/10/2013   HCT 33.2* 12/10/2013   MCV 90.3 12/10/2013   PLT 217 12/10/2013      Chemistry      Component Value Date/Time   NA 142 12/10/2013 1502   NA 136 09/23/2012 1525   K 3.7 12/10/2013 1502   K 4.0 09/23/2012 1525   CL 102 05/06/2013 1511   CL 101 09/23/2012 1525   CO2 29 12/10/2013 1502   CO2 26 09/23/2012 1525   BUN 26.1* 12/10/2013 1502   BUN 30* 09/23/2012 1525   CREATININE 0.8 12/10/2013 1502   CREATININE 0.84 09/23/2012 1525      Component Value Date/Time   CALCIUM 9.2 12/10/2013 1502   CALCIUM 8.9 09/23/2012 1525   ALKPHOS 37* 12/10/2013 1502   ALKPHOS 52 09/23/2012 1525   AST 18 12/10/2013 1502   AST 24 09/23/2012 1525   ALT 20 12/10/2013 1502   ALT 31 09/23/2012 1525   BILITOT 0.64 12/10/2013 1502   BILITOT 0.8 09/23/2012 1525       RADIOGRAPHIC STUDIES: No results found.  ASSESSMENT AND PLAN: This is a very pleasant 68 years old Hispanic female with metastatic non-small cell lung cancer, adenocarcinoma currently on treatment with Tarceva 150 mg by mouth daily and tolerating it fairly well. The patient also on Xgeva 120 mg subcutaneously every month for bone disease. She is tolerating her treatment fairly well with no significant adverse effects. Patient was discussed with also seen by Dr. Julien Nordmann. She was given a handicapped parking permit application as requested. She will continue on treatment with Tarceva at 150 mg by mouth daily as well as Xgeva 120 mg subcutaneously given every 4 weeks. She'll follow with Dr. Julien Nordmann in one month for another symptom management visit with a restaging CT scan of her chest, abdomen and pelvis to reevaluate her disease as well as a repeat CBC differential and C. met.  She was advised to call immediately if she has any concerning symptoms in the  interval.  The patient voices understanding of current disease status and treatment options and is in agreement with the current care plan.  All questions were answered. The patient knows to call the clinic with any problems, questions or concerns. We can certainly see the patient much sooner if necessary.  Carlton Adam PA-C  ADDENDUM: Hematology/Oncology Attending: I had face to face encounter with the patient. I recommended her care plan. This is a very pleasant 68 years old Hispanic female with metastatic non-small cell lung cancer, adenocarcinoma with positive EGFR mutation currently undergoing treatment with oral Tarceva 150 mg by mouth daily status post 16 months of treatment and tolerating it fairly well. The patient is also on subcutaneous Xgeva for metastatic bone disease. She has no significant complaints today. I recommended for her to continue her current treatment with Tarceva and Xgeva as scheduled. She would come back for follow up visit in one  month's for reevaluation and management any adverse effect of her treatment. She was advised to call immediately if she has any concerning symptoms in the interval.  Disclaimer: This note was dictated with voice recognition software. Similar sounding words can inadvertently be transcribed and may not be corrected upon review. [name]., MD 12/27/2013

## 2013-12-10 NOTE — Telephone Encounter (Signed)
gv and printed appt sched and avs for pt for Feb...gv pt barium

## 2013-12-12 NOTE — Patient Instructions (Signed)
Continue taking Tarceva 150 mg by mouth daily Followup with Dr. Julien Nordmann in one month with a restaging CT scan of the chest, abdomen and pelvis to reevaluate your disease

## 2013-12-17 ENCOUNTER — Telehealth: Payer: Self-pay | Admitting: *Deleted

## 2013-12-17 DIAGNOSIS — C799 Secondary malignant neoplasm of unspecified site: Secondary | ICD-10-CM

## 2013-12-17 MED ORDER — ERLOTINIB HCL 150 MG PO TABS: 150.0000 mg | ORAL_TABLET | Freq: Every day | ORAL | Status: DC

## 2013-12-17 NOTE — Addendum Note (Signed)
Addended by: Cherylynn Ridges on: 12/17/2013 05:06 PM   Modules accepted: Orders

## 2013-12-17 NOTE — Telephone Encounter (Signed)
[facility] faxed Tarceva refill request for.  Request to provider in basket for review.

## 2014-01-05 ENCOUNTER — Ambulatory Visit (HOSPITAL_COMMUNITY)
Admission: RE | Admit: 2014-01-05 | Discharge: 2014-01-05 | Disposition: A | Discharge status: Home / Self Care | Payer: 59 | Source: Ambulatory Visit | Attending: Physician Assistant | Admitting: Physician Assistant

## 2014-01-05 DIAGNOSIS — R918 Other nonspecific abnormal finding of lung field: Secondary | ICD-10-CM | POA: Insufficient documentation

## 2014-01-05 DIAGNOSIS — C7951 Secondary malignant neoplasm of bone: Secondary | ICD-10-CM | POA: Insufficient documentation

## 2014-01-05 DIAGNOSIS — C3490 Malignant neoplasm of unspecified part of unspecified bronchus or lung: Secondary | ICD-10-CM | POA: Insufficient documentation

## 2014-01-05 DIAGNOSIS — J984 Other disorders of lung: Secondary | ICD-10-CM | POA: Insufficient documentation

## 2014-01-05 DIAGNOSIS — I7090 Unspecified atherosclerosis: Secondary | ICD-10-CM | POA: Insufficient documentation

## 2014-01-05 DIAGNOSIS — N289 Disorder of kidney and ureter, unspecified: Secondary | ICD-10-CM | POA: Insufficient documentation

## 2014-01-05 DIAGNOSIS — N83339 Acquired atrophy of ovary and fallopian tube, unspecified side: Secondary | ICD-10-CM | POA: Insufficient documentation

## 2014-01-05 DIAGNOSIS — C7952 Secondary malignant neoplasm of bone marrow: Secondary | ICD-10-CM

## 2014-01-05 DIAGNOSIS — C799 Secondary malignant neoplasm of unspecified site: Secondary | ICD-10-CM

## 2014-01-05 DIAGNOSIS — N859 Noninflammatory disorder of uterus, unspecified: Secondary | ICD-10-CM | POA: Insufficient documentation

## 2014-01-05 MED ORDER — IOHEXOL 300 MG/ML  SOLN
80.0000 mL | Freq: Once | INTRAMUSCULAR | Status: AC | PRN
  Administered 2014-01-05: 80 mL via INTRAVENOUS

## 2014-01-07 ENCOUNTER — Encounter: Payer: Self-pay | Admitting: Physician Assistant

## 2014-01-07 ENCOUNTER — Other Ambulatory Visit (HOSPITAL_BASED_OUTPATIENT_CLINIC_OR_DEPARTMENT_OTHER): Payer: 59

## 2014-01-07 ENCOUNTER — Ambulatory Visit (HOSPITAL_BASED_OUTPATIENT_CLINIC_OR_DEPARTMENT_OTHER): Payer: 59 | Admitting: Physician Assistant

## 2014-01-07 ENCOUNTER — Telehealth: Payer: Self-pay | Admitting: Internal Medicine

## 2014-01-07 ENCOUNTER — Ambulatory Visit (HOSPITAL_BASED_OUTPATIENT_CLINIC_OR_DEPARTMENT_OTHER): Payer: 59

## 2014-01-07 VITALS — BP 131/46 | HR 69 | Temp 98.9°F | Resp 18 | Ht 62.0 in | Wt 161.1 lb

## 2014-01-07 DIAGNOSIS — C7951 Secondary malignant neoplasm of bone: Secondary | ICD-10-CM

## 2014-01-07 DIAGNOSIS — C3430 Malignant neoplasm of lower lobe, unspecified bronchus or lung: Secondary | ICD-10-CM

## 2014-01-07 DIAGNOSIS — C799 Secondary malignant neoplasm of unspecified site: Secondary | ICD-10-CM

## 2014-01-07 DIAGNOSIS — C7952 Secondary malignant neoplasm of bone marrow: Secondary | ICD-10-CM

## 2014-01-07 DIAGNOSIS — C3490 Malignant neoplasm of unspecified part of unspecified bronchus or lung: Secondary | ICD-10-CM

## 2014-01-07 LAB — COMPREHENSIVE METABOLIC PANEL (CC13)
ALBUMIN: 3.6 g/dL (ref 3.5–5.0)
ALK PHOS: 39 U/L — AB (ref 40–150)
ALT: 23 U/L (ref 0–55)
AST: 24 U/L (ref 5–34)
Anion Gap: 10 mEq/L (ref 3–11)
BUN: 28.4 mg/dL — AB (ref 7.0–26.0)
CALCIUM: 9.4 mg/dL (ref 8.4–10.4)
CHLORIDE: 103 meq/L (ref 98–109)
CO2: 29 meq/L (ref 22–29)
Creatinine: 0.8 mg/dL (ref 0.6–1.1)
GLUCOSE: 112 mg/dL (ref 70–140)
POTASSIUM: 3.7 meq/L (ref 3.5–5.1)
Sodium: 142 mEq/L (ref 136–145)
TOTAL PROTEIN: 6.9 g/dL (ref 6.4–8.3)
Total Bilirubin: 0.56 mg/dL (ref 0.20–1.20)

## 2014-01-07 LAB — CBC WITH DIFFERENTIAL/PLATELET
BASO%: 0.3 % (ref 0.0–2.0)
BASOS ABS: 0 10*3/uL (ref 0.0–0.1)
EOS ABS: 0.2 10*3/uL (ref 0.0–0.5)
EOS%: 2.2 % (ref 0.0–7.0)
HCT: 32.6 % — ABNORMAL LOW (ref 34.8–46.6)
HGB: 10.9 g/dL — ABNORMAL LOW (ref 11.6–15.9)
LYMPH%: 13.1 % — AB (ref 14.0–49.7)
MCH: 30.2 pg (ref 25.1–34.0)
MCHC: 33.5 g/dL (ref 31.5–36.0)
MCV: 90 fL (ref 79.5–101.0)
MONO#: 0.5 10*3/uL (ref 0.1–0.9)
MONO%: 8.1 % (ref 0.0–14.0)
NEUT#: 5.2 10*3/uL (ref 1.5–6.5)
NEUT%: 76.3 % (ref 38.4–76.8)
PLATELETS: 230 10*3/uL (ref 145–400)
RBC: 3.63 10*6/uL — ABNORMAL LOW (ref 3.70–5.45)
RDW: 13.1 % (ref 11.2–14.5)
WBC: 6.8 10*3/uL (ref 3.9–10.3)
lymph#: 0.9 10*3/uL (ref 0.9–3.3)

## 2014-01-07 MED ORDER — DENOSUMAB 120 MG/1.7ML ~~LOC~~ SOLN
120.0000 mg | Freq: Once | SUBCUTANEOUS | Status: AC
  Administered 2014-01-07: 120 mg via SUBCUTANEOUS
  Filled 2014-01-07: qty 1.7

## 2014-01-07 NOTE — Telephone Encounter (Signed)
Gave pt appt for lab ,Md and injection for March 2015

## 2014-01-07 NOTE — Progress Notes (Addendum)
[name] Telephone:(336) [phone]   Fax:(336) [phone]  SHARED VISIT PROGRESS NOTE  [name], MD [name] [address]  DIAGNOSIS: Metastatic non-small cell lung cancer, adenocarcinoma with positive EGFR mutation in exon 19 and negative ALK gene translocation diagnosed in July 2013 with bone metastasis.   PRIOR THERAPY: Status post radiotherapy to the left face rib metastasis under the care of Dr. Lisbeth Renshaw.   CURRENT THERAPY:  1) treatment with Tarceva 150 mg by mouth daily, therapy beginning 07/20/2012. Status post approximately 17 months of therapy.  2) Xgeva 120 mg subcutaneously every 4 weeks for bone disease.   CHEMOTHERAPY INTENT: Palliative  CURRENT # OF CHEMOTHERAPY CYCLES: 17 CURRENT ANTIEMETICS: Compazine  CURRENT SMOKING STATUS: Never smoker  ORAL CHEMOTHERAPY AND CONSENT: Tarceva  CURRENT BISPHOSPHONATES USE: Xgeva  PAIN MANAGEMENT: None  NARCOTICS INDUCED CONSTIPATION: None  LIVING WILL AND CODE STATUS: Full code  INTERVAL HISTORY: Jacqueline Leonard 68 y.o. female returns to the clinic today for monthly followup visit. She is accompanied by her husband today. She has had a flare to skin rash associated with her Tarceva therapy. She does admit to eating a lot more spicy foods and more frequently. She's had no issues with diarrhea. She had a restaging CT scan her chest, abdomen and pelvis and presents to discuss the results of those studies. She continues on Xgeva on a monthly basis for her history of bone metastasis. The patient is feeling fine today with no specific complaints.  She has no weight loss or night sweats. The patient denied having any significant chest pain, shortness of breath, cough or hemoptysis. She is tolerating her treatment with Tarceva fairly well.  MEDICAL HISTORY: Past Medical History  Diagnosis Date  . Lung mass   . Hypertension   . Hyperlipidemia   . Vitamin D deficiency   . Cancer, metastatic  to bone 06/19/12    bx=L 5th ribmetastatic Adenocarcinoma with known lung mass  . Radiation 07/11/12-07/24/12    Palliative lung tx 30 gray in 10 fx  . Status post chemoradiation     Tarceva  . External hemorrhoids     ALLERGIES:  is allergic to penicillins.  MEDICATIONS:  Current Outpatient Prescriptions  Medication Sig Dispense Refill  . amLODipine (NORVASC) 5 MG tablet Take 5 mg by mouth daily.      . cholecalciferol (VITAMIN D) 1000 UNITS tablet Take 1,000 Units by mouth daily.      Marland Kitchen erlotinib (TARCEVA) 150 MG tablet Take 1 tablet (150 mg total) by mouth daily. Take on an empty stomach 1 hour before meals or 2 hours after.  90 tablet  2  . Multiple Minerals-Vitamins (CALCIUM & VIT D3 BONE HEALTH PO) Take 750 mg by mouth 2 (two) times daily.      . valsartan-hydrochlorothiazide (DIOVAN-HCT) 160-25 MG per tablet Take 1 tablet by mouth daily.      . polyethylene glycol (MIRALAX / GLYCOLAX) packet Take 17 g by mouth daily as needed.      . simvastatin (ZOCOR) 20 MG tablet        No current facility-administered medications for this visit.    SURGICAL HISTORY:  Past Surgical History  Procedure Laterality Date  . Soft tissue biopsy  06/19/12    L 5th rib=metastatic adenocarcinoma  . Ovarian cyst removal      REVIEW OF SYSTEMS:  A comprehensive review of systems was negative except for: Integument/breast: positive for rash   PHYSICAL EXAMINATION:  General appearance: alert, cooperative and no distress Head: Normocephalic, without obvious abnormality, atraumatic Neck: no adenopathy, no JVD, supple, symmetrical, trachea midline and thyroid not enlarged, symmetric, no tenderness/mass/nodules Lymph nodes: Cervical, supraclavicular, and axillary nodes normal. Resp: clear to auscultation bilaterally Back: symmetric, no curvature. ROM normal. No CVA tenderness. Cardio: regular rate and rhythm, S1, S2 normal, no murmur, click, rub or gallop GI: soft, non-tender; bowel sounds normal; no  masses,  no organomegaly Extremities: extremities normal, atraumatic, no cyanosis or edema Skin: Reveals increased erythema with a few acneiform eruptions on the face affecting cheeks and chin and forehead. No evidence of superinfection  ECOG PERFORMANCE STATUS: 0 - Asymptomatic  Blood pressure 131/46, pulse 69, temperature 98.9 F (37.2 C), temperature source Oral, resp. rate 18, height _0  (1.575 m), weight 161 lb 1.6 oz (73.074 kg), SpO2 100.00%.  LABORATORY DATA: Lab Results  Component Value Date   WBC 6.8 01/07/2014   HGB 10.9* 01/07/2014   HCT 32.6* 01/07/2014   MCV 90.0 01/07/2014   PLT 230 01/07/2014      Chemistry      Component Value Date/Time   NA 142 01/07/2014 1442   NA 136 09/23/2012 1525   K 3.7 01/07/2014 1442   K 4.0 09/23/2012 1525   CL 102 05/06/2013 1511   CL 101 09/23/2012 1525   CO2 29 01/07/2014 1442   CO2 26 09/23/2012 1525   BUN 28.4* 01/07/2014 1442   BUN 30* 09/23/2012 1525   CREATININE 0.8 01/07/2014 1442   CREATININE 0.84 09/23/2012 1525      Component Value Date/Time   CALCIUM 9.4 01/07/2014 1442   CALCIUM 8.9 09/23/2012 1525   ALKPHOS 39* 01/07/2014 1442   ALKPHOS 52 09/23/2012 1525   AST 24 01/07/2014 1442   AST 24 09/23/2012 1525   ALT 23 01/07/2014 1442   ALT 31 09/23/2012 1525   BILITOT 0.56 01/07/2014 1442   BILITOT 0.8 09/23/2012 1525       RADIOGRAPHIC STUDIES: Ct Chest W Contrast  01/05/2014   CLINICAL DATA:  Non-small-cell lung cancer.  Restaging examination.  EXAM: CT CHEST, ABDOMEN, AND PELVIS WITH CONTRAST  TECHNIQUE: Multidetector CT imaging of the chest, abdomen and pelvis was performed following the standard protocol during bolus administration of intravenous contrast.  CONTRAST:  31m OMNIPAQUE IOHEXOL 300 MG/ML  SOLN  COMPARISON:  CT of the chest, abdomen and pelvis 10/10/2013.  FINDINGS: CT CHEST FINDINGS  Mediastinum: Heart size is normal. There is no significant pericardial fluid, thickening or pericardial calcification. No  pathologically enlarged mediastinal or hilar lymph nodes. Esophagus is unremarkable in appearance.  Lungs/Pleura: Chronic area of scarring in the medial aspect of the left lower lobe at the site of the previously treated mass is unchanged. Several tiny nodules are seen scattered throughout the lungs bilaterally (right greater than left), similar to numerous prior examinations, including largest nodule in the right lower lobe which measures 4 mm (image 24 of series 4). No other new suspicious appearing pulmonary nodules or masses are identified. No acute consolidative airspace disease. No pleural effusions.  Musculoskeletal: Expansile predominantly sclerotic lesion in the lateral aspect of the left fourth rib is similar to the prior study. Multiple sclerotic lesions are again noted throughout the spine, largest of which is in the T12 vertebral body, similar to prior examinations.  CT ABDOMEN AND PELVIS FINDINGS  Abdomen/Pelvis: The appearance of the liver, gallbladder, pancreas, spleen, bilateral adrenal glands and the right kidney is unremarkable. 2.9 cm low-attenuation nonenhancing lesion in  the lower pole of the left kidney is compatible with a simple cyst. No significant volume of ascites. No pneumoperitoneum. No pathologic distention of small bowel. No definite lymphadenopathy identified within the abdomen or pelvis. Atherosclerosis throughout the abdominal and pelvic vasculature, without evidence of aneurysm or dissection. Heterogeneous uterus with multiple calcified lesions, presumably fibroids. Ovaries appear atrophic. Urinary bladder is unremarkable in appearance.  Musculoskeletal: Multiple small sclerotic lesions again noted in the spine, unchanged, compatible with treated metastatic disease. Bilateral pars defects at L5 with 12 mm of anterolisthesis of L5 upon S1.  IMPRESSION: 1. Today's study demonstrates stable disease with an unchanged area of scarring in the medial left lower lobe, unchanged metastatic  disease to the bones (predominantly in the spine and the left fourth rib), and no definite evidence of new metastatic disease in the chest, abdomen or pelvis. 2. Multiple nonspecific pulmonary nodules scattered throughout the lungs bilaterally, unchanged over the past several examinations, favored to be benign. 3. Additional incidental findings, as above.   Electronically Signed   By: Vinnie Langton M.D.   On: 01/05/2014 17:07   Ct Abdomen Pelvis W Contrast  01/05/2014   CLINICAL DATA:  Non-small-cell lung cancer.  Restaging examination.  EXAM: CT CHEST, ABDOMEN, AND PELVIS WITH CONTRAST  TECHNIQUE: Multidetector CT imaging of the chest, abdomen and pelvis was performed following the standard protocol during bolus administration of intravenous contrast.  CONTRAST:  96m OMNIPAQUE IOHEXOL 300 MG/ML  SOLN  COMPARISON:  CT of the chest, abdomen and pelvis 10/10/2013.  FINDINGS: CT CHEST FINDINGS  Mediastinum: Heart size is normal. There is no significant pericardial fluid, thickening or pericardial calcification. No pathologically enlarged mediastinal or hilar lymph nodes. Esophagus is unremarkable in appearance.  Lungs/Pleura: Chronic area of scarring in the medial aspect of the left lower lobe at the site of the previously treated mass is unchanged. Several tiny nodules are seen scattered throughout the lungs bilaterally (right greater than left), similar to numerous prior examinations, including largest nodule in the right lower lobe which measures 4 mm (image 24 of series 4). No other new suspicious appearing pulmonary nodules or masses are identified. No acute consolidative airspace disease. No pleural effusions.  Musculoskeletal: Expansile predominantly sclerotic lesion in the lateral aspect of the left fourth rib is similar to the prior study. Multiple sclerotic lesions are again noted throughout the spine, largest of which is in the T12 vertebral body, similar to prior examinations.  CT ABDOMEN AND PELVIS  FINDINGS  Abdomen/Pelvis: The appearance of the liver, gallbladder, pancreas, spleen, bilateral adrenal glands and the right kidney is unremarkable. 2.9 cm low-attenuation nonenhancing lesion in the lower pole of the left kidney is compatible with a simple cyst. No significant volume of ascites. No pneumoperitoneum. No pathologic distention of small bowel. No definite lymphadenopathy identified within the abdomen or pelvis. Atherosclerosis throughout the abdominal and pelvic vasculature, without evidence of aneurysm or dissection. Heterogeneous uterus with multiple calcified lesions, presumably fibroids. Ovaries appear atrophic. Urinary bladder is unremarkable in appearance.  Musculoskeletal: Multiple small sclerotic lesions again noted in the spine, unchanged, compatible with treated metastatic disease. Bilateral pars defects at L5 with 12 mm of anterolisthesis of L5 upon S1.  IMPRESSION: 1. Today's study demonstrates stable disease with an unchanged area of scarring in the medial left lower lobe, unchanged metastatic disease to the bones (predominantly in the spine and the left fourth rib), and no definite evidence of new metastatic disease in the chest, abdomen or pelvis. 2. Multiple nonspecific pulmonary nodules  scattered throughout the lungs bilaterally, unchanged over the past several examinations, favored to be benign. 3. Additional incidental findings, as above.   Electronically Signed   By: Vinnie Langton M.D.   On: 01/05/2014 17:07    ASSESSMENT AND PLAN: This is a very pleasant 68 years old Hispanic female with metastatic non-small cell lung cancer, adenocarcinoma currently on treatment with Tarceva 150 mg by mouth daily and tolerating it fairly well. The patient also on Xgeva 120 mg subcutaneously every month for bone disease. She is tolerating her treatment fairly well with no significant adverse effects. Patient was discussed with also seen by Dr. Julien Nordmann. Her recent CT scan revealed no evidence  for rate current or metastatic disease. She will continue on treatment with Tarceva at 150 mg by mouth daily as well as Xgeva 120 mg subcutaneously given every 4 weeks. She'll follow  in one month for another symptom management visit. She was advised to reduce the ingestion of a very spicy foods as this is likely aggravating her skin rash.   She was advised to call immediately if she has any concerning symptoms in the interval.  The patient voices understanding of current disease status and treatment options and is in agreement with the current care plan.  All questions were answered. The patient knows to call the clinic with any problems, questions or concerns. We can certainly see the patient much sooner if necessary.  Carlton Adam PA-C  ADDENDUM:  Hematology/Oncology Attending:  I had a face to face encounter with the patient. I recommended her care plan. This is a very pleasant 68 years old Hispanic female with metastatic non-small cell lung cancer, adenocarcinoma with positive EGFR mutation in exon 19, currently undergoing treatment with oral Tarceva status post 17 months and tolerating her treatment fairly well. The patient has no evidence for disease progression on his recent scan. I discussed the scan results with the patient today. I recommended for her to continue her current treatment with Tarceva 150 mg by mouth daily. She would come back for followup visit in one month for reevaluation. She was advised to call immediately if she has any concerning symptoms in the interval.  Disclaimer: This note was dictated with voice recognition software. Similar sounding words can inadvertently be transcribed and may not be corrected upon review. [name]., MD 01/11/2014

## 2014-01-08 NOTE — Patient Instructions (Signed)
Your CT scan revealed no evidence for recurrent or metastatic disease Followup in one month

## 2014-01-19 ENCOUNTER — Telehealth: Payer: Self-pay | Admitting: Medical Oncology

## 2014-01-19 NOTE — Telephone Encounter (Signed)
PCP prescribed prednisone  for rash on legs . Can she take it with tarceva? Okay to take Prednisone per [facility].

## 2014-02-03 ENCOUNTER — Other Ambulatory Visit: Payer: Self-pay | Admitting: *Deleted

## 2014-02-03 DIAGNOSIS — C799 Secondary malignant neoplasm of unspecified site: Secondary | ICD-10-CM

## 2014-02-04 ENCOUNTER — Other Ambulatory Visit (HOSPITAL_BASED_OUTPATIENT_CLINIC_OR_DEPARTMENT_OTHER): Payer: 59

## 2014-02-04 ENCOUNTER — Telehealth: Payer: Self-pay | Admitting: Internal Medicine

## 2014-02-04 ENCOUNTER — Ambulatory Visit (HOSPITAL_BASED_OUTPATIENT_CLINIC_OR_DEPARTMENT_OTHER): Payer: 59 | Admitting: Internal Medicine

## 2014-02-04 ENCOUNTER — Encounter: Payer: Self-pay | Admitting: Internal Medicine

## 2014-02-04 ENCOUNTER — Ambulatory Visit (HOSPITAL_BASED_OUTPATIENT_CLINIC_OR_DEPARTMENT_OTHER): Payer: 59

## 2014-02-04 VITALS — BP 121/41 | HR 78 | Temp 98.0°F | Resp 18 | Ht 62.0 in | Wt 155.4 lb

## 2014-02-04 DIAGNOSIS — C799 Secondary malignant neoplasm of unspecified site: Secondary | ICD-10-CM

## 2014-02-04 DIAGNOSIS — C7952 Secondary malignant neoplasm of bone marrow: Secondary | ICD-10-CM

## 2014-02-04 DIAGNOSIS — C3430 Malignant neoplasm of lower lobe, unspecified bronchus or lung: Secondary | ICD-10-CM

## 2014-02-04 DIAGNOSIS — C7951 Secondary malignant neoplasm of bone: Secondary | ICD-10-CM

## 2014-02-04 DIAGNOSIS — E559 Vitamin D deficiency, unspecified: Secondary | ICD-10-CM

## 2014-02-04 DIAGNOSIS — N289 Disorder of kidney and ureter, unspecified: Secondary | ICD-10-CM

## 2014-02-04 DIAGNOSIS — D649 Anemia, unspecified: Secondary | ICD-10-CM

## 2014-02-04 DIAGNOSIS — R944 Abnormal results of kidney function studies: Secondary | ICD-10-CM

## 2014-02-04 LAB — COMPREHENSIVE METABOLIC PANEL (CC13)
ALBUMIN: 3 g/dL — AB (ref 3.5–5.0)
ALT: 28 U/L (ref 0–55)
ANION GAP: 8 meq/L (ref 3–11)
AST: 21 U/L (ref 5–34)
Alkaline Phosphatase: 48 U/L (ref 40–150)
BUN: 34 mg/dL — ABNORMAL HIGH (ref 7.0–26.0)
CO2: 28 mEq/L (ref 22–29)
Calcium: 9.2 mg/dL (ref 8.4–10.4)
Chloride: 102 mEq/L (ref 98–109)
Creatinine: 2 mg/dL — ABNORMAL HIGH (ref 0.6–1.1)
Glucose: 101 mg/dl (ref 70–140)
Potassium: 4.2 mEq/L (ref 3.5–5.1)
SODIUM: 138 meq/L (ref 136–145)
TOTAL PROTEIN: 6.6 g/dL (ref 6.4–8.3)
Total Bilirubin: 0.75 mg/dL (ref 0.20–1.20)

## 2014-02-04 LAB — CBC WITH DIFFERENTIAL/PLATELET
BASO%: 0.1 % (ref 0.0–2.0)
Basophils Absolute: 0 10*3/uL (ref 0.0–0.1)
EOS ABS: 0.1 10*3/uL (ref 0.0–0.5)
EOS%: 1.9 % (ref 0.0–7.0)
HCT: 28 % — ABNORMAL LOW (ref 34.8–46.6)
HGB: 9.3 g/dL — ABNORMAL LOW (ref 11.6–15.9)
LYMPH#: 0.5 10*3/uL — AB (ref 0.9–3.3)
LYMPH%: 8.2 % — ABNORMAL LOW (ref 14.0–49.7)
MCH: 29.3 pg (ref 25.1–34.0)
MCHC: 33.1 g/dL (ref 31.5–36.0)
MCV: 88.5 fL (ref 79.5–101.0)
MONO#: 0.4 10*3/uL (ref 0.1–0.9)
MONO%: 6 % (ref 0.0–14.0)
NEUT#: 5.2 10*3/uL (ref 1.5–6.5)
NEUT%: 83.8 % — ABNORMAL HIGH (ref 38.4–76.8)
Platelets: 212 10*3/uL (ref 145–400)
RBC: 3.16 10*6/uL — AB (ref 3.70–5.45)
RDW: 13.3 % (ref 11.2–14.5)
WBC: 6.2 10*3/uL (ref 3.9–10.3)

## 2014-02-04 MED ORDER — DENOSUMAB 120 MG/1.7ML ~~LOC~~ SOLN
120.0000 mg | Freq: Once | SUBCUTANEOUS | Status: AC
  Administered 2014-02-04: 120 mg via SUBCUTANEOUS
  Filled 2014-02-04: qty 1.7

## 2014-02-04 NOTE — Progress Notes (Signed)
[facility] Telephone:(336) [phone]   Fax:(336) [phone]  OFFICE PROGRESS NOTE  [name], MD Jacqueline Leonard [id number]  DIAGNOSIS: Metastatic non-small cell lung cancer, adenocarcinoma with positive EGFR mutation in exon 19 and negative ALK gene translocation diagnosed in July 2013 with bone metastasis.   PRIOR THERAPY: Status post radiotherapy to the left face rib metastasis under the care of Dr. Lisbeth Renshaw.   CURRENT THERAPY:  1) treatment with Tarceva 150 mg by mouth daily, therapy beginning 07/20/2012. Status post approximately 15 months of therapy.  2) Xgeva 120 mg subcutaneously every 4 weeks for bone disease.   CHEMOTHERAPY INTENT: Palliative  CURRENT # OF CHEMOTHERAPY CYCLES: 15  CURRENT ANTIEMETICS: Compazine  CURRENT SMOKING STATUS: Never smoker  ORAL CHEMOTHERAPY AND CONSENT: Tarceva  CURRENT BISPHOSPHONATES USE: Xgeva  PAIN MANAGEMENT: None  NARCOTICS INDUCED CONSTIPATION: None  LIVING WILL AND CODE STATUS: Full code  INTERVAL HISTORY: Jacqueline Leonard 68 y.o. female returns to the clinic today for monthly followup visit accompanied by her husband. She was recently treated with a course of prednisone 20 mg by mouth daily for 7 days for worsening rash on the right lower extremity. She did not feel well during the treatment with prednisone. The patient is feeling a little bit better today with no specific complaints. She denied having any significant skin rash or diarrhea. She has no weight loss or night sweats. The patient denied having any significant chest pain, shortness of breath, cough or hemoptysis. She is tolerating her treatment with Tarceva fairly well.   MEDICAL HISTORY: Past Medical History  Diagnosis Date  . Lung mass   . Hypertension   . Hyperlipidemia   . Vitamin D deficiency   . Cancer, metastatic to bone 06/19/12    bx=L 5th ribmetastatic Adenocarcinoma with known lung mass  . Radiation 07/11/12-07/24/12   Palliative lung tx 30 gray in 10 fx  . Status post chemoradiation     Tarceva  . External hemorrhoids     ALLERGIES:  is allergic to penicillins.  MEDICATIONS:  Current Outpatient Prescriptions  Medication Sig Dispense Refill  . amLODipine (NORVASC) 5 MG tablet Take 5 mg by mouth daily.      . cholecalciferol (VITAMIN D) 1000 UNITS tablet Take 1,000 Units by mouth daily.      Marland Kitchen erlotinib (TARCEVA) 150 MG tablet Take 1 tablet (150 mg total) by mouth daily. Take on an empty stomach 1 hour before meals or 2 hours after.  90 tablet  2  . Multiple Minerals-Vitamins (CALCIUM & VIT D3 BONE HEALTH PO) Take 750 mg by mouth 2 (two) times daily.      . polyethylene glycol (MIRALAX / GLYCOLAX) packet Take 17 g by mouth daily as needed.      . simvastatin (ZOCOR) 20 MG tablet       . valsartan-hydrochlorothiazide (DIOVAN-HCT) 160-25 MG per tablet Take 1 tablet by mouth daily.       No current facility-administered medications for this visit.   Facility-Administered Medications Ordered in Other Visits  Medication Dose Route Frequency Provider Last Rate Last Dose  . denosumab (XGEVA) injection 120 mg  120 mg Subcutaneous Once Curt Bears, MD        SURGICAL HISTORY:  Past Surgical History  Procedure Laterality Date  . Soft tissue biopsy  06/19/12    L 5th rib=metastatic adenocarcinoma  . Ovarian cyst removal      REVIEW OF SYSTEMS:  A comprehensive review of  systems was negative except for: Constitutional: positive for fatigue   PHYSICAL EXAMINATION: General appearance: alert, cooperative and no distress Head: Normocephalic, without obvious abnormality, atraumatic Neck: no adenopathy, no JVD, supple, symmetrical, trachea midline and thyroid not enlarged, symmetric, no tenderness/mass/nodules Lymph nodes: Cervical, supraclavicular, and axillary nodes normal. Resp: clear to auscultation bilaterally Back: symmetric, no curvature. ROM normal. No CVA tenderness. Cardio: regular rate and  rhythm, S1, S2 normal, no murmur, click, rub or gallop GI: soft, non-tender; bowel sounds normal; no masses,  no organomegaly Extremities: extremities normal, atraumatic, no cyanosis or edema  ECOG PERFORMANCE STATUS: 1 - Symptomatic but completely ambulatory  Blood pressure 121/41, pulse 78, temperature 98 F (36.7 C), temperature source Oral, resp. rate 18, height _0  (1.575 m), weight 155 lb 6.4 oz (70.489 kg), SpO2 98.00%.  LABORATORY DATA: Lab Results  Component Value Date   WBC 6.2 02/04/2014   HGB 9.3* 02/04/2014   HCT 28.0* 02/04/2014   MCV 88.5 02/04/2014   PLT 212 02/04/2014      Chemistry      Component Value Date/Time   NA 142 01/07/2014 1442   NA 136 09/23/2012 1525   K 3.7 01/07/2014 1442   K 4.0 09/23/2012 1525   CL 102 05/06/2013 1511   CL 101 09/23/2012 1525   CO2 29 01/07/2014 1442   CO2 26 09/23/2012 1525   BUN 28.4* 01/07/2014 1442   BUN 30* 09/23/2012 1525   CREATININE 0.8 01/07/2014 1442   CREATININE 0.84 09/23/2012 1525      Component Value Date/Time   CALCIUM 9.4 01/07/2014 1442   CALCIUM 8.9 09/23/2012 1525   ALKPHOS 39* 01/07/2014 1442   ALKPHOS 52 09/23/2012 1525   AST 24 01/07/2014 1442   AST 24 09/23/2012 1525   ALT 23 01/07/2014 1442   ALT 31 09/23/2012 1525   BILITOT 0.56 01/07/2014 1442   BILITOT 0.8 09/23/2012 1525       RADIOGRAPHIC STUDIES: No results found.  ASSESSMENT AND PLAN: This is a very pleasant 68 years old Hispanic female with metastatic non-small cell lung cancer, adenocarcinoma currently on treatment with Tarceva 150 mg by mouth daily and tolerating it fairly well. The patient also on Xgeva 120 mg subcutaneously every month for bone disease. Her serum creatinine is elevated today most likely secondary to poor by mouth intake. I encourage the patient to increase her oral intake and I will check her serum creatinine next week. She also has persistent anemia with low hemoglobin and hematocrit on today's lab. I will order few studies  to evaluate the etiology of her anemia including iron study, ferritin, serum folate, and B12, serum protein electrophoresis as well as serum erythropoietin level. This lab will be performed next week. She is tolerating her treatment fairly well with no significant adverse effects. I recommended for the patient to proceed with her treatment with Tarceva in [place] as previously scheduled. She will come back for followup visit in one month with repeat CBC and comprehensive metabolic panel. She was advised to call immediately if she has any concerning symptoms in the interval.  The patient voices understanding of current disease status and treatment options and is in agreement with the current care plan.  All questions were answered. The patient knows to call the clinic with any problems, questions or concerns. We can certainly see the patient much sooner if necessary.  Disclaimer: This note was dictated with voice recognition software. Similar sounding words can inadvertently be transcribed and may not be  corrected upon review.

## 2014-02-04 NOTE — Telephone Encounter (Signed)
gave pt appt for lab and MD for April 2015

## 2014-02-11 ENCOUNTER — Other Ambulatory Visit (HOSPITAL_BASED_OUTPATIENT_CLINIC_OR_DEPARTMENT_OTHER): Payer: 59

## 2014-02-11 DIAGNOSIS — C3430 Malignant neoplasm of lower lobe, unspecified bronchus or lung: Secondary | ICD-10-CM

## 2014-02-11 DIAGNOSIS — N289 Disorder of kidney and ureter, unspecified: Secondary | ICD-10-CM

## 2014-02-11 DIAGNOSIS — E559 Vitamin D deficiency, unspecified: Secondary | ICD-10-CM

## 2014-02-11 DIAGNOSIS — D649 Anemia, unspecified: Secondary | ICD-10-CM

## 2014-02-11 LAB — CBC WITH DIFFERENTIAL/PLATELET
BASO%: 0.5 % (ref 0.0–2.0)
Basophils Absolute: 0 10*3/uL (ref 0.0–0.1)
EOS%: 3.9 % (ref 0.0–7.0)
Eosinophils Absolute: 0.2 10*3/uL (ref 0.0–0.5)
HEMATOCRIT: 28.1 % — AB (ref 34.8–46.6)
HGB: 9.1 g/dL — ABNORMAL LOW (ref 11.6–15.9)
LYMPH%: 9.5 % — ABNORMAL LOW (ref 14.0–49.7)
MCH: 28.6 pg (ref 25.1–34.0)
MCHC: 32.3 g/dL (ref 31.5–36.0)
MCV: 88.6 fL (ref 79.5–101.0)
MONO#: 0.5 10*3/uL (ref 0.1–0.9)
MONO%: 7.5 % (ref 0.0–14.0)
NEUT#: 4.9 10*3/uL (ref 1.5–6.5)
NEUT%: 78.6 % — AB (ref 38.4–76.8)
Platelets: 300 10*3/uL (ref 145–400)
RBC: 3.17 10*6/uL — AB (ref 3.70–5.45)
RDW: 13.5 % (ref 11.2–14.5)
WBC: 6.3 10*3/uL (ref 3.9–10.3)
lymph#: 0.6 10*3/uL — ABNORMAL LOW (ref 0.9–3.3)

## 2014-02-11 LAB — IRON AND TIBC CHCC
%SAT: 19 % — ABNORMAL LOW (ref 21–57)
Iron: 41 ug/dL (ref 41–142)
TIBC: 211 ug/dL — AB (ref 236–444)
UIBC: 171 ug/dL (ref 120–384)

## 2014-02-11 LAB — COMPREHENSIVE METABOLIC PANEL (CC13)
ALBUMIN: 3.2 g/dL — AB (ref 3.5–5.0)
ALT: 25 U/L (ref 0–55)
ANION GAP: 8 meq/L (ref 3–11)
AST: 19 U/L (ref 5–34)
Alkaline Phosphatase: 46 U/L (ref 40–150)
BUN: 38 mg/dL — ABNORMAL HIGH (ref 7.0–26.0)
CALCIUM: 9.3 mg/dL (ref 8.4–10.4)
CHLORIDE: 104 meq/L (ref 98–109)
CO2: 27 mEq/L (ref 22–29)
Creatinine: 1.1 mg/dL (ref 0.6–1.1)
GLUCOSE: 104 mg/dL (ref 70–140)
POTASSIUM: 3.7 meq/L (ref 3.5–5.1)
Sodium: 139 mEq/L (ref 136–145)
Total Bilirubin: 0.58 mg/dL (ref 0.20–1.20)
Total Protein: 6.8 g/dL (ref 6.4–8.3)

## 2014-02-11 LAB — FERRITIN CHCC: FERRITIN: 321 ng/mL — AB (ref 9–269)

## 2014-02-12 ENCOUNTER — Telehealth: Payer: Self-pay | Admitting: Internal Medicine

## 2014-02-12 ENCOUNTER — Other Ambulatory Visit: Payer: Self-pay | Admitting: Internal Medicine

## 2014-02-12 DIAGNOSIS — D519 Vitamin B12 deficiency anemia, unspecified: Secondary | ICD-10-CM

## 2014-02-12 MED ORDER — CYANOCOBALAMIN 1000 MCG/ML IJ SOLN: 1000.0000 ug | Freq: Once | INTRAMUSCULAR | Status: DC

## 2014-02-12 NOTE — Telephone Encounter (Signed)
s.w pt family member and pt was at work he will have Mrs. Hankey call back to sched appt

## 2014-02-13 ENCOUNTER — Ambulatory Visit (HOSPITAL_BASED_OUTPATIENT_CLINIC_OR_DEPARTMENT_OTHER): Payer: 59

## 2014-02-13 ENCOUNTER — Other Ambulatory Visit: Payer: Self-pay | Admitting: Medical Oncology

## 2014-02-13 VITALS — BP 121/39 | HR 74 | Temp 99.1°F

## 2014-02-13 DIAGNOSIS — C3430 Malignant neoplasm of lower lobe, unspecified bronchus or lung: Secondary | ICD-10-CM

## 2014-02-13 DIAGNOSIS — D519 Vitamin B12 deficiency anemia, unspecified: Secondary | ICD-10-CM

## 2014-02-13 LAB — ERYTHROPOIETIN: ERYTHROPOIETIN: 8.3 m[IU]/mL (ref 2.6–18.5)

## 2014-02-13 LAB — FOLATE: Folate: 15 ng/mL

## 2014-02-13 LAB — PROTEIN ELECTROPHORESIS, SERUM
ALBUMIN ELP: 51.6 % — AB (ref 55.8–66.1)
ALPHA-1-GLOBULIN: 6.2 % — AB (ref 2.9–4.9)
ALPHA-2-GLOBULIN: 11.9 % — AB (ref 7.1–11.8)
BETA 2: 5.8 % (ref 3.2–6.5)
BETA GLOBULIN: 6.2 % (ref 4.7–7.2)
Gamma Globulin: 18.3 % (ref 11.1–18.8)
TOTAL PROTEIN, SERUM ELECTROPHOR: 6.5 g/dL (ref 6.0–8.3)

## 2014-02-13 LAB — VITAMIN B12: Vitamin B-12: 181 pg/mL — ABNORMAL LOW (ref 211–911)

## 2014-02-13 MED ORDER — CYANOCOBALAMIN 1000 MCG/ML IJ SOLN
1000.0000 ug | Freq: Once | INTRAMUSCULAR | Status: AC
  Administered 2014-02-13: 1000 ug via INTRAMUSCULAR

## 2014-02-13 NOTE — Patient Instructions (Signed)
Vitamin B12 Injections Every person needs vitamin B12. A deficiency develops when the body does not get enough of it. One way to overcome this is by getting B12 shots (injections). A B12 shot puts the vitamin directly into muscle tissue. This avoids any problems your body might have in absorbing it from food or a pill. In some people, the body has trouble using the vitamin correctly. This can cause a B12 deficiency. Not consuming enough of the vitamin can also cause a deficiency. Getting enough vitamin B12 can be hard for elderly people. Sometimes, they do not eat a well-balanced diet. The elderly are also more likely than younger people to have medical conditions or take medications that can lead to a deficiency. WHAT DOES VITAMIN B12 DO? Vitamin B12 does many things to help the body work right:  It helps the body make healthy red blood cells.  It helps maintain nerve cells.  It is involved in the body's process of converting food into energy (metabolism).  It is needed to make the genetic material in all cells (DNA). VITAMIN B12 FOOD SOURCES Most people get plenty of vitamin B12 through the foods they eat. It is present in:  Meat, fish, poultry, and eggs.  Milk and milk products.  It also is added when certain foods are made, including some breads, cereals and yogurts. The food is then called "fortified". CAUSES The most common causes of vitamin B12 deficiency are:  Pernicious anemia. The condition develops when the body cannot make enough healthy red blood cells. This stems from a lack of a protein made in the stomach (intrinsic factor). People without this protein cannot absorb enough vitamin B12 from food.  Malabsorption. This is when the body cannot absorb the vitamin. It can be caused by:  Pernicious anemia.  Surgery to remove part or all of the stomach can lead to malabsorption. Removal of part or all of the small intestine can also cause malabsorption.  Vegetarian diet.  People who are strict about not eating foods from animals could have trouble taking in enough vitamin B12 from diet alone.  Medications. Some medicines have been linked to B12 deficiency, such as Metformin (a drug prescribed for type 2 diabetes). Long-term use of stomach acid suppressants also can keep the vitamin from being absorbed.  Intestinal problems such as inflammatory bowel disease. If there are problems in the digestive tract, vitamin B12 may not be absorbed in good enough amounts. SYMPTOMS People who do not get enough B12 can develop problems. These can include:  Anemia. This is when the body has too few red blood cells. Red blood cells carry oxygen to the rest of the body. Without a healthy supply of red blood cells, people can feel:  Tired (fatigued).  Weak.  Severe anemia can cause:  Shortness of breath.  Dizziness.  Rapid heart rate.  Paleness.  Other Vitamin B12 deficiency symptoms include:  Diarrhea.  Numbness or tingling in the hands or feet.  Loss of appetite.  Confusion.  Sores on the tongue or in the mouth. LET YOUR CAREGIVER KNOW ABOUT:  Any allergies. It is very important to know if you are allergic or sensitive to cobalt. Vitamin B12 contains cobalt.  Any history of kidney disease.  All medications you are taking. Include prescription and over-the-counter medicines, herbs and creams.  Whether you are pregnant or breast-feeding.  If you have Leber's disease, a hereditary eye condition, vitamin B12 could make it worse. RISKS AND COMPLICATIONS Reactions to an injection are   usually temporary. They might include:  Pain at the injection site.  Redness, swelling or tenderness at the site.  Headache, dizziness or weakness.  Nausea, upset stomach or diarrhea.  Numbness or tingling.  Fever.  Joint pain.  Itching or rash. If a reaction does not go away in a short while, talk with your healthcare provider. A change in the way the shots are  given, or where they are given, might need to be made. BEFORE AN INJECTION To decide whether B12 injections are right for you, your healthcare provider will probably:  Ask about your medical history.  Ask questions about your diet.  Ask about symptoms such as:  Have you felt weak?  Do you feel unusually tired?  Do you get dizzy?  Order blood tests. These may include a test to:  Check the level of red cells in your blood.  Measure B12 levels.  Check for the presence of intrinsic factor. VITAMIN B12 INJECTIONS How often you will need a vitamin B12 injection will depend on how severe your deficiency is. This also will affect how long you will need to get them. People with pernicious anemia usually get injections for their entire life. Others might get them for a shorter period. For many people, injections are given daily or weekly for several weeks. Then, once B12 levels are normal, injections are given just once a month. If the cause of the deficiency can be fixed, the injections can be stopped. Talk with your healthcare provider about what you should expect. For an injection:  The injection site will be cleaned with an alcohol swab.  Your healthcare provider will insert a needle directly into a muscle. Most any muscle can be used. Most often, an arm muscle is used. A buttocks muscle can also be used. Many people say shots in that area are less painful.  A small adhesive bandage may be put over the injection site. It usually can be taken off in an hour or less. Injections can be given by your healthcare provider. In some cases, family members give them. Sometimes, people give them to themselves. Talk with your healthcare provider about what would be best for you. If someone other than your healthcare provider will be giving the shots, the person will need to be trained to give them correctly. HOME CARE INSTRUCTIONS   You can remove the adhesive bandage within an hour of getting a  shot.  You should be able to go about your normal activities right away.  Avoid drinking large amounts of alcohol while taking vitamin B12 shots. Alcohol can interfere with the body's use of the vitamin. SEEK MEDICAL CARE IF:   Pain, redness, swelling or tenderness at the injection site does not get better or gets worse.  Headache, dizziness or weakness does not go away.  You develop a fever of more than 100.5 F (38.1 C). SEEK IMMEDIATE MEDICAL CARE IF:   You have chest pain.  You develop shortness of breath.  You have muscle weakness that gets worse.  You develop numbness, weakness or tingling on one side or one area of the body.  You have symptoms of an allergic reaction, such as:  Hives.  Difficulty breathing.  Swelling of the lips, face, tongue or throat.  You develop a fever of more than 102.0 F (38.9 C). MAKE SURE YOU:   Understand these instructions.  Will watch your condition.  Will get help right away if you are not doing well or get worse. Document   Released: 02/09/2009 Document Revised: 02/05/2012 Document Reviewed: 02/09/2009 [employer] Patient Information 2014 [employer].  

## 2014-03-03 ENCOUNTER — Other Ambulatory Visit (HOSPITAL_BASED_OUTPATIENT_CLINIC_OR_DEPARTMENT_OTHER): Payer: 59

## 2014-03-03 ENCOUNTER — Ambulatory Visit (HOSPITAL_BASED_OUTPATIENT_CLINIC_OR_DEPARTMENT_OTHER): Payer: 59 | Admitting: Internal Medicine

## 2014-03-03 ENCOUNTER — Telehealth: Payer: Self-pay | Admitting: Internal Medicine

## 2014-03-03 ENCOUNTER — Encounter: Payer: Self-pay | Admitting: Internal Medicine

## 2014-03-03 VITALS — BP 127/48 | HR 69 | Temp 98.0°F | Resp 18 | Ht 62.0 in | Wt 156.2 lb

## 2014-03-03 DIAGNOSIS — C3430 Malignant neoplasm of lower lobe, unspecified bronchus or lung: Secondary | ICD-10-CM

## 2014-03-03 DIAGNOSIS — C7952 Secondary malignant neoplasm of bone marrow: Secondary | ICD-10-CM

## 2014-03-03 DIAGNOSIS — C7951 Secondary malignant neoplasm of bone: Secondary | ICD-10-CM

## 2014-03-03 DIAGNOSIS — C799 Secondary malignant neoplasm of unspecified site: Secondary | ICD-10-CM

## 2014-03-03 LAB — COMPREHENSIVE METABOLIC PANEL (CC13)
ALBUMIN: 3.4 g/dL — AB (ref 3.5–5.0)
ALT: 19 U/L (ref 0–55)
AST: 18 U/L (ref 5–34)
Alkaline Phosphatase: 34 U/L — ABNORMAL LOW (ref 40–150)
Anion Gap: 6 mEq/L (ref 3–11)
BUN: 22.4 mg/dL (ref 7.0–26.0)
CO2: 29 mEq/L (ref 22–29)
Calcium: 8.8 mg/dL (ref 8.4–10.4)
Chloride: 108 mEq/L (ref 98–109)
Creatinine: 1.1 mg/dL (ref 0.6–1.1)
GLUCOSE: 117 mg/dL (ref 70–140)
POTASSIUM: 3.8 meq/L (ref 3.5–5.1)
Sodium: 143 mEq/L (ref 136–145)
TOTAL PROTEIN: 6.9 g/dL (ref 6.4–8.3)
Total Bilirubin: 0.56 mg/dL (ref 0.20–1.20)

## 2014-03-03 LAB — CBC WITH DIFFERENTIAL/PLATELET
BASO%: 0.4 % (ref 0.0–2.0)
BASOS ABS: 0 10*3/uL (ref 0.0–0.1)
EOS ABS: 0.2 10*3/uL (ref 0.0–0.5)
EOS%: 3.6 % (ref 0.0–7.0)
HEMATOCRIT: 29 % — AB (ref 34.8–46.6)
HEMOGLOBIN: 9.4 g/dL — AB (ref 11.6–15.9)
LYMPH%: 14.1 % (ref 14.0–49.7)
MCH: 29.1 pg (ref 25.1–34.0)
MCHC: 32.6 g/dL (ref 31.5–36.0)
MCV: 89.2 fL (ref 79.5–101.0)
MONO#: 0.5 10*3/uL (ref 0.1–0.9)
MONO%: 9.1 % (ref 0.0–14.0)
NEUT%: 72.8 % (ref 38.4–76.8)
NEUTROS ABS: 4.3 10*3/uL (ref 1.5–6.5)
Platelets: 223 10*3/uL (ref 145–400)
RBC: 3.25 10*6/uL — ABNORMAL LOW (ref 3.70–5.45)
RDW: 14.2 % (ref 11.2–14.5)
WBC: 5.9 10*3/uL (ref 3.9–10.3)
lymph#: 0.8 10*3/uL — ABNORMAL LOW (ref 0.9–3.3)

## 2014-03-03 NOTE — Progress Notes (Signed)
[facility] Telephone:(336) [phone]   Fax:(336) [phone]  OFFICE PROGRESS NOTE  [name], MD [place] [id number]  DIAGNOSIS: Metastatic non-small cell lung cancer, adenocarcinoma with positive EGFR mutation in exon 19 and negative ALK gene translocation diagnosed in July 2013 with bone metastasis.   PRIOR THERAPY: Status post radiotherapy to the left face rib metastasis under the care of Dr. Lisbeth Renshaw.   CURRENT THERAPY:  1) treatment with Tarceva 150 mg by mouth daily, therapy beginning 07/20/2012. Status post approximately 16 months of therapy.  2) Xgeva 120 mg subcutaneously every 4 weeks for bone disease.   CHEMOTHERAPY INTENT: Palliative  CURRENT # OF CHEMOTHERAPY CYCLES: 17 CURRENT ANTIEMETICS: Compazine  CURRENT SMOKING STATUS: Never smoker  ORAL CHEMOTHERAPY AND CONSENT: Tarceva  CURRENT BISPHOSPHONATES USE: Xgeva  PAIN MANAGEMENT: None  NARCOTICS INDUCED CONSTIPATION: None  LIVING WILL AND CODE STATUS: Full code  INTERVAL HISTORY: Jacqueline Leonard 68 y.o. female returns to the clinic today for monthly followup visit. The patient is feeling much better today after she started treatment with over-the-counter ferrous sulfate once daily. She has no fever or chills, no nausea or vomiting. She denied having any significant skin rash or diarrhea. She has no weight loss or night sweats. The patient denied having any significant chest pain, shortness of breath, cough or hemoptysis. She is tolerating her treatment with Tarceva fairly well. She had several studies performed last month for evaluation of her anemia and these were unremarkable except for slightly low iron saturation.   MEDICAL HISTORY: Past Medical History  Diagnosis Date  . Lung mass   . Hypertension   . Hyperlipidemia   . Vitamin D deficiency   . Cancer, metastatic to bone 06/19/12    bx=L 5th ribmetastatic Adenocarcinoma with known lung mass  . Radiation  07/11/12-07/24/12    Palliative lung tx 30 gray in 10 fx  . Status post chemoradiation     Tarceva  . External hemorrhoids     ALLERGIES:  is allergic to penicillins.  MEDICATIONS:  Current Outpatient Prescriptions  Medication Sig Dispense Refill  . amLODipine (NORVASC) 5 MG tablet Take 5 mg by mouth daily.      . cholecalciferol (VITAMIN D) 1000 UNITS tablet Take 1,000 Units by mouth daily.      Marland Kitchen erlotinib (TARCEVA) 150 MG tablet Take 1 tablet (150 mg total) by mouth daily. Take on an empty stomach 1 hour before meals or 2 hours after.  90 tablet  2  . Multiple Minerals-Vitamins (CALCIUM & VIT D3 BONE HEALTH PO) Take 750 mg by mouth 2 (two) times daily.      . polyethylene glycol (MIRALAX / GLYCOLAX) packet Take 17 g by mouth daily as needed.      . simvastatin (ZOCOR) 20 MG tablet       . valsartan-hydrochlorothiazide (DIOVAN-HCT) 160-25 MG per tablet Take 1 tablet by mouth daily.       No current facility-administered medications for this visit.    SURGICAL HISTORY:  Past Surgical History  Procedure Laterality Date  . Soft tissue biopsy  06/19/12    L 5th rib=metastatic adenocarcinoma  . Ovarian cyst removal      REVIEW OF SYSTEMS:  A comprehensive review of systems was negative except for: Constitutional: positive for fatigue   PHYSICAL EXAMINATION: General appearance: alert, cooperative and no distress Head: Normocephalic, without obvious abnormality, atraumatic Neck: no adenopathy, no JVD, supple, symmetrical, trachea midline and thyroid not  enlarged, symmetric, no tenderness/mass/nodules Lymph nodes: Cervical, supraclavicular, and axillary nodes normal. Resp: clear to auscultation bilaterally Back: symmetric, no curvature. ROM normal. No CVA tenderness. Cardio: regular rate and rhythm, S1, S2 normal, no murmur, click, rub or gallop GI: soft, non-tender; bowel sounds normal; no masses,  no organomegaly Extremities: extremities normal, atraumatic, no cyanosis or  edema  ECOG PERFORMANCE STATUS: 1 - Symptomatic but completely ambulatory  There were no vitals taken for this visit.  LABORATORY DATA: Lab Results  Component Value Date   WBC 5.9 03/03/2014   HGB 9.4* 03/03/2014   HCT 29.0* 03/03/2014   MCV 89.2 03/03/2014   PLT 223 03/03/2014      Chemistry      Component Value Date/Time   NA 139 02/11/2014 1508   NA 136 09/23/2012 1525   K 3.7 02/11/2014 1508   K 4.0 09/23/2012 1525   CL 102 05/06/2013 1511   CL 101 09/23/2012 1525   CO2 27 02/11/2014 1508   CO2 26 09/23/2012 1525   BUN 38.0* 02/11/2014 1508   BUN 30* 09/23/2012 1525   CREATININE 1.1 02/11/2014 1508   CREATININE 0.84 09/23/2012 1525      Component Value Date/Time   CALCIUM 9.3 02/11/2014 1508   CALCIUM 8.9 09/23/2012 1525   ALKPHOS 46 02/11/2014 1508   ALKPHOS 52 09/23/2012 1525   AST 19 02/11/2014 1508   AST 24 09/23/2012 1525   ALT 25 02/11/2014 1508   ALT 31 09/23/2012 1525   BILITOT 0.58 02/11/2014 1508   BILITOT 0.8 09/23/2012 1525       RADIOGRAPHIC STUDIES: No results found.  ASSESSMENT AND PLAN: This is a very pleasant 68 years old Hispanic female with metastatic non-small cell lung cancer, adenocarcinoma currently on treatment with Tarceva 150 mg by mouth daily and tolerating it fairly well. The patient also on Xgeva 120 mg subcutaneously every month for bone disease. She is tolerating her treatment fairly well with no significant adverse effects. I recommended for the patient to proceed with her treatment with Tarceva in [place] as previously scheduled. She will come back for followup visit in one month with repeat CBC and comprehensive metabolic panel as well as CT scan of the chest, abdomen and pelvis for restaging of her disease. For the iron deficiency, the patient will continue on over the counter ferrous sulfate but I asked her to take it twice a day. She was advised to call immediately if she has any concerning symptoms in the interval.  The patient voices  understanding of current disease status and treatment options and is in agreement with the current care plan.  All questions were answered. The patient knows to call the clinic with any problems, questions or concerns. We can certainly see the patient much sooner if necessary.  Disclaimer: This note was dictated with voice recognition software. Similar sounding words can inadvertently be transcribed and may not be corrected upon review.

## 2014-03-03 NOTE — Telephone Encounter (Signed)
gv adn printed appt sched and avs for pt for May...gv pt barium

## 2014-03-31 ENCOUNTER — Encounter (HOSPITAL_COMMUNITY): Payer: Self-pay

## 2014-03-31 ENCOUNTER — Other Ambulatory Visit (HOSPITAL_BASED_OUTPATIENT_CLINIC_OR_DEPARTMENT_OTHER): Payer: 59

## 2014-03-31 ENCOUNTER — Ambulatory Visit (HOSPITAL_COMMUNITY)
Admission: RE | Admit: 2014-03-31 | Discharge: 2014-03-31 | Disposition: A | Discharge status: Home / Self Care | Payer: 59 | Source: Ambulatory Visit | Attending: Internal Medicine | Admitting: Internal Medicine

## 2014-03-31 DIAGNOSIS — C7952 Secondary malignant neoplasm of bone marrow: Secondary | ICD-10-CM

## 2014-03-31 DIAGNOSIS — Z79899 Other long term (current) drug therapy: Secondary | ICD-10-CM | POA: Insufficient documentation

## 2014-03-31 DIAGNOSIS — C799 Secondary malignant neoplasm of unspecified site: Secondary | ICD-10-CM

## 2014-03-31 DIAGNOSIS — C3430 Malignant neoplasm of lower lobe, unspecified bronchus or lung: Secondary | ICD-10-CM

## 2014-03-31 DIAGNOSIS — C7951 Secondary malignant neoplasm of bone: Secondary | ICD-10-CM | POA: Insufficient documentation

## 2014-03-31 DIAGNOSIS — Z923 Personal history of irradiation: Secondary | ICD-10-CM | POA: Insufficient documentation

## 2014-03-31 LAB — CBC WITH DIFFERENTIAL/PLATELET
BASO%: 0.5 % (ref 0.0–2.0)
Basophils Absolute: 0 10*3/uL (ref 0.0–0.1)
EOS%: 4.8 % (ref 0.0–7.0)
Eosinophils Absolute: 0.3 10*3/uL (ref 0.0–0.5)
HCT: 30.6 % — ABNORMAL LOW (ref 34.8–46.6)
HGB: 9.9 g/dL — ABNORMAL LOW (ref 11.6–15.9)
LYMPH%: 21.7 % (ref 14.0–49.7)
MCH: 28.9 pg (ref 25.1–34.0)
MCHC: 32.4 g/dL (ref 31.5–36.0)
MCV: 89.5 fL (ref 79.5–101.0)
MONO#: 0.5 10*3/uL (ref 0.1–0.9)
MONO%: 8.1 % (ref 0.0–14.0)
NEUT#: 3.9 10*3/uL (ref 1.5–6.5)
NEUT%: 64.9 % (ref 38.4–76.8)
PLATELETS: 214 10*3/uL (ref 145–400)
RBC: 3.42 10*6/uL — AB (ref 3.70–5.45)
RDW: 14.8 % — ABNORMAL HIGH (ref 11.2–14.5)
WBC: 6 10*3/uL (ref 3.9–10.3)
lymph#: 1.3 10*3/uL (ref 0.9–3.3)

## 2014-03-31 LAB — COMPREHENSIVE METABOLIC PANEL (CC13)
ALK PHOS: 35 U/L — AB (ref 40–150)
ALT: 20 U/L (ref 0–55)
AST: 21 U/L (ref 5–34)
Albumin: 3.5 g/dL (ref 3.5–5.0)
Anion Gap: 8 mEq/L (ref 3–11)
BILIRUBIN TOTAL: 0.54 mg/dL (ref 0.20–1.20)
BUN: 22.8 mg/dL (ref 7.0–26.0)
CO2: 27 mEq/L (ref 22–29)
Calcium: 9.3 mg/dL (ref 8.4–10.4)
Chloride: 103 mEq/L (ref 98–109)
Creatinine: 0.9 mg/dL (ref 0.6–1.1)
Glucose: 94 mg/dl (ref 70–140)
Potassium: 4 mEq/L (ref 3.5–5.1)
SODIUM: 138 meq/L (ref 136–145)
TOTAL PROTEIN: 6.9 g/dL (ref 6.4–8.3)

## 2014-03-31 MED ORDER — IOHEXOL 300 MG/ML  SOLN
100.0000 mL | Freq: Once | INTRAMUSCULAR | Status: AC | PRN
  Administered 2014-03-31: 100 mL via INTRAVENOUS

## 2014-04-07 ENCOUNTER — Encounter: Payer: Self-pay | Admitting: Internal Medicine

## 2014-04-07 ENCOUNTER — Telehealth: Payer: Self-pay | Admitting: Internal Medicine

## 2014-04-07 ENCOUNTER — Ambulatory Visit (HOSPITAL_BASED_OUTPATIENT_CLINIC_OR_DEPARTMENT_OTHER): Payer: 59 | Admitting: Internal Medicine

## 2014-04-07 VITALS — BP 143/60 | HR 65 | Temp 97.9°F | Resp 19 | Ht 62.0 in | Wt 158.1 lb

## 2014-04-07 DIAGNOSIS — C7952 Secondary malignant neoplasm of bone marrow: Secondary | ICD-10-CM

## 2014-04-07 DIAGNOSIS — C799 Secondary malignant neoplasm of unspecified site: Secondary | ICD-10-CM

## 2014-04-07 DIAGNOSIS — C7951 Secondary malignant neoplasm of bone: Secondary | ICD-10-CM

## 2014-04-07 DIAGNOSIS — C3430 Malignant neoplasm of lower lobe, unspecified bronchus or lung: Secondary | ICD-10-CM

## 2014-04-07 DIAGNOSIS — D509 Iron deficiency anemia, unspecified: Secondary | ICD-10-CM

## 2014-04-07 NOTE — Telephone Encounter (Signed)
gv adn printed appt sched and avs for pt for June °

## 2014-04-07 NOTE — Progress Notes (Signed)
[place] Telephone:([phone]   Fax:([phone]  OFFICE PROGRESS NOTE  [name], MD [place] [id number]  DIAGNOSIS: Metastatic non-small cell lung cancer, adenocarcinoma with positive EGFR mutation in exon 19 and negative ALK gene translocation diagnosed in July 2013 with bone metastasis.   PRIOR THERAPY: Status post radiotherapy to the left face rib metastasis under the care of Dr. Lisbeth Renshaw.   CURRENT THERAPY:  1) treatment with Tarceva 150 mg by mouth daily, therapy beginning 07/20/2012. Status post approximately 16 months of therapy.  2) Xgeva 120 mg subcutaneously every 4 weeks for bone disease.   CHEMOTHERAPY INTENT: Palliative  CURRENT # OF CHEMOTHERAPY CYCLES: 17 CURRENT ANTIEMETICS: Compazine  CURRENT SMOKING STATUS: Never smoker  ORAL CHEMOTHERAPY AND CONSENT: Tarceva  CURRENT BISPHOSPHONATES USE: Xgeva  PAIN MANAGEMENT: None  NARCOTICS INDUCED CONSTIPATION: None  LIVING WILL AND CODE STATUS: Full code  INTERVAL HISTORY: Jacqueline Leonard 68 y.o. female returns to the clinic today for monthly followup visit accompanied by her husband. She is tolerating her current treatment with Tarceva 150 mg by mouth daily fairly well with no significant adverse effects except for mild skin rash and increasing erythema of her cheeks recently. She denied having any significant diarrhea. She has no fever or chills, no nausea or vomiting. She has no weight loss or night sweats. The patient denied having any significant chest pain, shortness of breath, cough or hemoptysis. She had repeat CT scan of the chest, abdomen and pelvis performed recently and she is here for evaluation and discussion of her scan results.  MEDICAL HISTORY: Past Medical History  Diagnosis Date  . Lung mass   . Hypertension   . Hyperlipidemia   . Vitamin D deficiency   . Radiation 07/11/12-07/24/12    Palliative lung tx 30 gray in 10 fx  . Status post  chemoradiation     Tarceva  . External hemorrhoids   . Cancer, metastatic to bone 06/19/12    bx=L 5th ribmetastatic Adenocarcinoma with known lung mass    ALLERGIES:  is allergic to penicillins.  MEDICATIONS:  Current Outpatient Prescriptions  Medication Sig Dispense Refill  . amLODipine (NORVASC) 5 MG tablet Take 5 mg by mouth daily.      . cholecalciferol (VITAMIN D) 1000 UNITS tablet Take 1,000 Units by mouth daily.      Marland Kitchen erlotinib (TARCEVA) 150 MG tablet Take 1 tablet (150 mg total) by mouth daily. Take on an empty stomach 1 hour before meals or 2 hours after.  90 tablet  2  . Multiple Minerals-Vitamins (CALCIUM & VIT D3 BONE HEALTH PO) Take 750 mg by mouth 2 (two) times daily.      . polyethylene glycol (MIRALAX / GLYCOLAX) packet Take 17 g by mouth daily as needed.      . simvastatin (ZOCOR) 20 MG tablet       . valsartan-hydrochlorothiazide (DIOVAN-HCT) 160-25 MG per tablet Take 1 tablet by mouth daily.      . potassium chloride (K-DUR) 10 MEQ tablet Take 1 tablet by mouth daily.       No current facility-administered medications for this visit.    SURGICAL HISTORY:  Past Surgical History  Procedure Laterality Date  . Soft tissue biopsy  06/19/12    L 5th rib=metastatic adenocarcinoma  . Ovarian cyst removal      REVIEW OF SYSTEMS:  Constitutional: negative Eyes: negative Ears, nose, mouth, throat, and face: negative Respiratory: negative Cardiovascular: negative  Gastrointestinal: negative Genitourinary:negative Integument/breast: positive for dryness and rash Hematologic/lymphatic: negative Musculoskeletal:negative Neurological: negative Behavioral/Psych: negative Endocrine: negative Allergic/Immunologic: negative   PHYSICAL EXAMINATION: General appearance: alert, cooperative and no distress Head: Normocephalic, without obvious abnormality, atraumatic Neck: no adenopathy, no JVD, supple, symmetrical, trachea midline and thyroid not enlarged, symmetric, no  tenderness/mass/nodules Lymph nodes: Cervical, supraclavicular, and axillary nodes normal. Resp: clear to auscultation bilaterally Back: symmetric, no curvature. ROM normal. No CVA tenderness. Cardio: regular rate and rhythm, S1, S2 normal, no murmur, click, rub or gallop GI: soft, non-tender; bowel sounds normal; no masses,  no organomegaly Extremities: extremities normal, atraumatic, no cyanosis or edema Neurologic: Alert and oriented X 3, normal strength and tone. Normal symmetric reflexes. Normal coordination and gait  ECOG PERFORMANCE STATUS: 1 - Symptomatic but completely ambulatory  Blood pressure 143/60, pulse 65, temperature 97.9 F (36.6 C), temperature source Oral, resp. rate 19, height _0  (1.575 m), weight 158 lb 1.6 oz (71.714 kg).  LABORATORY DATA: Lab Results  Component Value Date   WBC 6.0 03/31/2014   HGB 9.9* 03/31/2014   HCT 30.6* 03/31/2014   MCV 89.5 03/31/2014   PLT 214 03/31/2014      Chemistry      Component Value Date/Time   NA 138 03/31/2014 1508   NA 136 09/23/2012 1525   K 4.0 03/31/2014 1508   K 4.0 09/23/2012 1525   CL 102 05/06/2013 1511   CL 101 09/23/2012 1525   CO2 27 03/31/2014 1508   CO2 26 09/23/2012 1525   BUN 22.8 03/31/2014 1508   BUN 30* 09/23/2012 1525   CREATININE 0.9 03/31/2014 1508   CREATININE 0.84 09/23/2012 1525      Component Value Date/Time   CALCIUM 9.3 03/31/2014 1508   CALCIUM 8.9 09/23/2012 1525   ALKPHOS 35* 03/31/2014 1508   ALKPHOS 52 09/23/2012 1525   AST 21 03/31/2014 1508   AST 24 09/23/2012 1525   ALT 20 03/31/2014 1508   ALT 31 09/23/2012 1525   BILITOT 0.54 03/31/2014 1508   BILITOT 0.8 09/23/2012 1525       RADIOGRAPHIC STUDIES: Ct Chest W Contrast  03/31/2014   CLINICAL DATA:  Followup metastatic lung carcinoma. Oral chemotherapy in progress. Previous radiation therapy.  EXAM: CT CHEST, ABDOMEN, AND PELVIS WITH CONTRAST  TECHNIQUE: Multidetector CT imaging of the chest, abdomen and pelvis was performed following the standard  protocol during bolus administration of intravenous contrast.  CONTRAST:  1110m OMNIPAQUE IOHEXOL 300 MG/ML  SOLN  COMPARISON:  01/05/2014  FINDINGS:   CT CHEST FINDINGS  Post treatment changes in the posterior medial left lower lobe is again demonstrated. There is an adjacent solid appearing nodular opacity which measures 11 mm on image 27 which is increased in size from approximately 9 mm on previous study. Local recurrence of carcinoma at this site cannot be excluded. Other tiny scattered nodular densities in both lungs show no significant change.  No evidence of pleural or pericardial effusion. No evidence of hilar or mediastinal lymphadenopathy. No adenopathy seen elsewhere within the thorax. Sclerotic bone metastases in the lower thoracic spine and left lateral fourth rib show no significant change in appearance.    CT ABDOMEN AND PELVIS FINDINGS  The liver, gallbladder, pancreas, spleen, and adrenal glands are normal in appearance. Benign appearing simple left renal cyst is stable and there is no evidence of renal masses or hydronephrosis.  Calcified uterine fibroid stable. Adnexal regions are unremarkable in appearance. No other soft tissue masses or lymphadenopathy identified within  the abdomen or pelvis.  No evidence of inflammatory process or abnormal fluid collections. No evidence of bowel wall thickening or dilatation. Tiny sclerotic bone metastases in the lumbar spine are stable. Bilateral L5 pars defects and associated spondylolisthesis again noted.    IMPRESSION: Mild increase in size of 11 mm pulmonary nodule in the posterior medial left lower lobe. Local recurrence of carcinoma at this site cannot be excluded. Consider PET-CT scan for further evaluation.  Stable appearance of sclerotic bone metastases.  Stable calcified uterine fibroid.   Electronically Signed   By: Earle Gell M.D.   On: 03/31/2014 17:18    ASSESSMENT AND PLAN: This is a very pleasant 68 years old Hispanic female with  metastatic non-small cell lung cancer, adenocarcinoma currently on treatment with Tarceva 150 mg by mouth daily status post 16 months of treatment and tolerating it fairly well.  The patient also on Xgeva 120 mg subcutaneously every month for bone disease.  Her recent scan showed no significant evidence for disease progression except for minimal increase in the size of the posterior medial left lower lobe lesion which increased from 9 mm to 11 mm. I discussed the scan results and showed the images to the patient and her husband. I recommended for the patient to continue her treatment with Tarceva in Xgeva as previously scheduled. She will come back for followup visit in one month with repeat CBC and comprehensive metabolic panel. For the iron deficiency, the patient will continue on over the counter ferrous sulfate. She was advised to call immediately if she has any concerning symptoms in the interval.  The patient voices understanding of current disease status and treatment options and is in agreement with the current care plan.  All questions were answered. The patient knows to call the clinic with any problems, questions or concerns. We can certainly see the patient much sooner if necessary.  Disclaimer: This note was dictated with voice recognition software. Similar sounding words can inadvertently be transcribed and may not be corrected upon review.

## 2014-04-24 ENCOUNTER — Encounter: Payer: Self-pay | Admitting: Internal Medicine

## 2014-04-24 NOTE — Progress Notes (Signed)
Called and spoke with Mickel Baas at [facility] and she activated new Xgeva card- the old one has expired.

## 2014-05-05 ENCOUNTER — Other Ambulatory Visit (HOSPITAL_BASED_OUTPATIENT_CLINIC_OR_DEPARTMENT_OTHER): Payer: 59

## 2014-05-05 ENCOUNTER — Telehealth: Payer: Self-pay | Admitting: Internal Medicine

## 2014-05-05 ENCOUNTER — Ambulatory Visit (HOSPITAL_BASED_OUTPATIENT_CLINIC_OR_DEPARTMENT_OTHER): Payer: 59 | Admitting: Internal Medicine

## 2014-05-05 ENCOUNTER — Encounter: Payer: Self-pay | Admitting: Internal Medicine

## 2014-05-05 VITALS — BP 136/54 | HR 64 | Temp 98.1°F | Resp 18 | Ht 62.0 in | Wt 158.7 lb

## 2014-05-05 DIAGNOSIS — C3430 Malignant neoplasm of lower lobe, unspecified bronchus or lung: Secondary | ICD-10-CM

## 2014-05-05 DIAGNOSIS — D509 Iron deficiency anemia, unspecified: Secondary | ICD-10-CM

## 2014-05-05 DIAGNOSIS — C7951 Secondary malignant neoplasm of bone: Secondary | ICD-10-CM

## 2014-05-05 DIAGNOSIS — C799 Secondary malignant neoplasm of unspecified site: Secondary | ICD-10-CM

## 2014-05-05 DIAGNOSIS — C7952 Secondary malignant neoplasm of bone marrow: Secondary | ICD-10-CM

## 2014-05-05 LAB — COMPREHENSIVE METABOLIC PANEL (CC13)
ALBUMIN: 3.3 g/dL — AB (ref 3.5–5.0)
ALT: 24 U/L (ref 0–55)
AST: 20 U/L (ref 5–34)
Alkaline Phosphatase: 34 U/L — ABNORMAL LOW (ref 40–150)
Anion Gap: 6 mEq/L (ref 3–11)
BUN: 31 mg/dL — ABNORMAL HIGH (ref 7.0–26.0)
CHLORIDE: 106 meq/L (ref 98–109)
CO2: 28 mEq/L (ref 22–29)
CREATININE: 0.9 mg/dL (ref 0.6–1.1)
Calcium: 8.9 mg/dL (ref 8.4–10.4)
Glucose: 99 mg/dl (ref 70–140)
POTASSIUM: 4 meq/L (ref 3.5–5.1)
Sodium: 140 mEq/L (ref 136–145)
Total Bilirubin: 0.32 mg/dL (ref 0.20–1.20)
Total Protein: 6.7 g/dL (ref 6.4–8.3)

## 2014-05-05 LAB — CBC WITH DIFFERENTIAL/PLATELET
BASO%: 0.3 % (ref 0.0–2.0)
Basophils Absolute: 0 10*3/uL (ref 0.0–0.1)
EOS%: 3.4 % (ref 0.0–7.0)
Eosinophils Absolute: 0.2 10*3/uL (ref 0.0–0.5)
HCT: 31.6 % — ABNORMAL LOW (ref 34.8–46.6)
HGB: 10.3 g/dL — ABNORMAL LOW (ref 11.6–15.9)
LYMPH%: 18.3 % (ref 14.0–49.7)
MCH: 29.3 pg (ref 25.1–34.0)
MCHC: 32.6 g/dL (ref 31.5–36.0)
MCV: 90 fL (ref 79.5–101.0)
MONO#: 0.5 10*3/uL (ref 0.1–0.9)
MONO%: 7.9 % (ref 0.0–14.0)
NEUT#: 4.4 10*3/uL (ref 1.5–6.5)
NEUT%: 70.1 % (ref 38.4–76.8)
Platelets: 211 10*3/uL (ref 145–400)
RBC: 3.51 10*6/uL — ABNORMAL LOW (ref 3.70–5.45)
RDW: 14.1 % (ref 11.2–14.5)
WBC: 6.2 10*3/uL (ref 3.9–10.3)
lymph#: 1.1 10*3/uL (ref 0.9–3.3)

## 2014-05-05 NOTE — Progress Notes (Signed)
[place] Telephone:([phone]   Fax:(336) [phone]  OFFICE PROGRESS NOTE  [name], [place] [id number]  DIAGNOSIS: Metastatic non-small cell lung cancer, adenocarcinoma with positive EGFR mutation in exon 19 and negative ALK gene translocation diagnosed in July 2013 with bone metastasis.   PRIOR THERAPY: Status post radiotherapy to the left face rib metastasis under the care of Dr. Lisbeth Renshaw.   CURRENT THERAPY:  1) treatment with Tarceva 150 mg by mouth daily, therapy beginning 07/20/2012. Status post approximately 17 months of therapy.  2) Xgeva 120 mg subcutaneously every 4 weeks for bone disease.   CHEMOTHERAPY INTENT: Palliative  CURRENT # OF CHEMOTHERAPY CYCLES: 18 CURRENT ANTIEMETICS: Compazine  CURRENT SMOKING STATUS: Never smoker  ORAL CHEMOTHERAPY AND CONSENT: Tarceva  CURRENT BISPHOSPHONATES USE: Xgeva  PAIN MANAGEMENT: None  NARCOTICS INDUCED CONSTIPATION: None  LIVING WILL AND CODE STATUS: Full code  INTERVAL HISTORY: Jacqueline Leonard 68 y.o. female returns to the clinic today for monthly followup visit accompanied by her husband. She is tolerating her current treatment with Tarceva 150 mg by mouth daily fairly well with no significant adverse effects. She visited her family in [place] for 10 days and she enjoyed her time there. She denied having any significant diarrhea. She has no fever or chills, no nausea or vomiting. She has no weight loss or night sweats. The patient denied having any significant chest pain, shortness of breath, cough or hemoptysis.   MEDICAL HISTORY: Past Medical History  Diagnosis Date  . Lung mass   . Hypertension   . Hyperlipidemia   . Vitamin D deficiency   . Radiation 07/11/12-07/24/12    Palliative lung tx 30 gray in 10 fx  . Status post chemoradiation     Tarceva  . External hemorrhoids   . Cancer, metastatic to bone 06/19/12    bx=L 5th ribmetastatic Adenocarcinoma with  known lung mass    ALLERGIES:  is allergic to penicillins.  MEDICATIONS:  Current Outpatient Prescriptions  Medication Sig Dispense Refill  . amLODipine (NORVASC) 5 MG tablet Take 5 mg by mouth daily.      . cholecalciferol (VITAMIN D) 1000 UNITS tablet Take 1,000 Units by mouth daily.      Marland Kitchen erlotinib (TARCEVA) 150 MG tablet Take 1 tablet (150 mg total) by mouth daily. Take on an empty stomach 1 hour before meals or 2 hours after.  90 tablet  2  . Multiple Minerals-Vitamins (CALCIUM & VIT D3 BONE HEALTH PO) Take 750 mg by mouth 2 (two) times daily.      . polyethylene glycol (MIRALAX / GLYCOLAX) packet Take 17 g by mouth daily as needed.      . potassium chloride (K-DUR) 10 MEQ tablet Take 1 tablet by mouth daily.      . simvastatin (ZOCOR) 20 MG tablet       . valsartan-hydrochlorothiazide (DIOVAN-HCT) 160-25 MG per tablet Take 1 tablet by mouth daily.       No current facility-administered medications for this visit.    SURGICAL HISTORY:  Past Surgical History  Procedure Laterality Date  . Soft tissue biopsy  06/19/12    L 5th rib=metastatic adenocarcinoma  . Ovarian cyst removal      REVIEW OF SYSTEMS:  Constitutional: negative Eyes: negative Ears, nose, mouth, throat, and face: negative Respiratory: negative Cardiovascular: negative Gastrointestinal: negative Genitourinary:negative Integument/breast: positive for dryness and rash Hematologic/lymphatic: negative Musculoskeletal:negative Neurological: negative Behavioral/Psych: negative Endocrine: negative Allergic/Immunologic: negative  PHYSICAL EXAMINATION: General appearance: alert, cooperative and no distress Head: Normocephalic, without obvious abnormality, atraumatic Neck: no adenopathy, no JVD, supple, symmetrical, trachea midline and thyroid not enlarged, symmetric, no tenderness/mass/nodules Lymph nodes: Cervical, supraclavicular, and axillary nodes normal. Resp: clear to auscultation bilaterally Back:  symmetric, no curvature. ROM normal. No CVA tenderness. Cardio: regular rate and rhythm, S1, S2 normal, no murmur, click, rub or gallop GI: soft, non-tender; bowel sounds normal; no masses,  no organomegaly Extremities: extremities normal, atraumatic, no cyanosis or edema Neurologic: Alert and oriented X 3, normal strength and tone. Normal symmetric reflexes. Normal coordination and gait  ECOG PERFORMANCE STATUS: 1 - Symptomatic but completely ambulatory  Blood pressure 136/54, pulse 64, temperature 98.1 F (36.7 C), temperature source Oral, resp. rate 18, height 5' 2"  (1.575 m), weight 158 lb 11.2 oz (71.986 kg), SpO2 100.00%.  LABORATORY DATA: Lab Results  Component Value Date   WBC 6.2 05/05/2014   HGB 10.3* 05/05/2014   HCT 31.6* 05/05/2014   MCV 90.0 05/05/2014   PLT 211 05/05/2014      Chemistry      Component Value Date/Time   NA 138 03/31/2014 1508   NA 136 09/23/2012 1525   K 4.0 03/31/2014 1508   K 4.0 09/23/2012 1525   CL 102 05/06/2013 1511   CL 101 09/23/2012 1525   CO2 27 03/31/2014 1508   CO2 26 09/23/2012 1525   BUN 22.8 03/31/2014 1508   BUN 30* 09/23/2012 1525   CREATININE 0.9 03/31/2014 1508   CREATININE 0.84 09/23/2012 1525      Component Value Date/Time   CALCIUM 9.3 03/31/2014 1508   CALCIUM 8.9 09/23/2012 1525   ALKPHOS 35* 03/31/2014 1508   ALKPHOS 52 09/23/2012 1525   AST 21 03/31/2014 1508   AST 24 09/23/2012 1525   ALT 20 03/31/2014 1508   ALT 31 09/23/2012 1525   BILITOT 0.54 03/31/2014 1508   BILITOT 0.8 09/23/2012 1525       RADIOGRAPHIC STUDIES:  ASSESSMENT AND PLAN: This is a very pleasant 68 years old Hispanic female with metastatic non-small cell lung cancer, adenocarcinoma currently on treatment with Tarceva 150 mg by mouth daily status post 17 months of treatment and tolerating it fairly well.  I recommended for the patient to continue her treatment with Tarceva in Xgeva as previously scheduled. She will come back for followup visit in one month with repeat  CBC and comprehensive metabolic panel. For the iron deficiency, the patient will continue on over the counter ferrous sulfate. She was advised to call immediately if she has any concerning symptoms in the interval.  The patient voices understanding of current disease status and treatment options and is in agreement with the current care plan.  All questions were answered. The patient knows to call the clinic with any problems, questions or concerns. We can certainly see the patient much sooner if necessary.  Disclaimer: This note was dictated with voice recognition software. Similar sounding words can inadvertently be transcribed and may not be corrected upon review.

## 2014-05-05 NOTE — Telephone Encounter (Signed)
Gave pt appt for lab and MD for july 2015

## 2014-05-22 ENCOUNTER — Telehealth: Payer: Self-pay | Admitting: Internal Medicine

## 2014-05-22 NOTE — Telephone Encounter (Signed)
s.w. pt husband and advised that 7.8 appt moved to 7.7.Marland Kitchenok and aware

## 2014-06-02 ENCOUNTER — Encounter: Payer: Self-pay | Admitting: Internal Medicine

## 2014-06-02 ENCOUNTER — Ambulatory Visit (HOSPITAL_BASED_OUTPATIENT_CLINIC_OR_DEPARTMENT_OTHER): Payer: 59 | Admitting: Internal Medicine

## 2014-06-02 ENCOUNTER — Telehealth: Payer: Self-pay | Admitting: Internal Medicine

## 2014-06-02 ENCOUNTER — Other Ambulatory Visit (HOSPITAL_BASED_OUTPATIENT_CLINIC_OR_DEPARTMENT_OTHER): Payer: 59

## 2014-06-02 VITALS — BP 125/54 | HR 65 | Temp 97.0°F | Resp 18 | Ht 62.0 in | Wt 162.2 lb

## 2014-06-02 DIAGNOSIS — C799 Secondary malignant neoplasm of unspecified site: Secondary | ICD-10-CM

## 2014-06-02 DIAGNOSIS — C3430 Malignant neoplasm of lower lobe, unspecified bronchus or lung: Secondary | ICD-10-CM

## 2014-06-02 DIAGNOSIS — C7951 Secondary malignant neoplasm of bone: Secondary | ICD-10-CM

## 2014-06-02 DIAGNOSIS — C7952 Secondary malignant neoplasm of bone marrow: Secondary | ICD-10-CM

## 2014-06-02 DIAGNOSIS — R21 Rash and other nonspecific skin eruption: Secondary | ICD-10-CM

## 2014-06-02 DIAGNOSIS — D509 Iron deficiency anemia, unspecified: Secondary | ICD-10-CM

## 2014-06-02 LAB — CBC WITH DIFFERENTIAL/PLATELET
BASO%: 0.4 % (ref 0.0–2.0)
BASOS ABS: 0 10*3/uL (ref 0.0–0.1)
EOS%: 3.6 % (ref 0.0–7.0)
Eosinophils Absolute: 0.2 10*3/uL (ref 0.0–0.5)
HCT: 33.2 % — ABNORMAL LOW (ref 34.8–46.6)
HEMOGLOBIN: 10.8 g/dL — AB (ref 11.6–15.9)
LYMPH%: 20.1 % (ref 14.0–49.7)
MCH: 29.3 pg (ref 25.1–34.0)
MCHC: 32.6 g/dL (ref 31.5–36.0)
MCV: 89.8 fL (ref 79.5–101.0)
MONO#: 0.4 10*3/uL (ref 0.1–0.9)
MONO%: 6.8 % (ref 0.0–14.0)
NEUT#: 3.9 10*3/uL (ref 1.5–6.5)
NEUT%: 69.1 % (ref 38.4–76.8)
Platelets: 208 10*3/uL (ref 145–400)
RBC: 3.7 10*6/uL (ref 3.70–5.45)
RDW: 13.5 % (ref 11.2–14.5)
WBC: 5.7 10*3/uL (ref 3.9–10.3)
lymph#: 1.1 10*3/uL (ref 0.9–3.3)

## 2014-06-02 LAB — COMPREHENSIVE METABOLIC PANEL (CC13)
ALK PHOS: 33 U/L — AB (ref 40–150)
ALT: 22 U/L (ref 0–55)
AST: 20 U/L (ref 5–34)
Albumin: 3.5 g/dL (ref 3.5–5.0)
Anion Gap: 7 mEq/L (ref 3–11)
BILIRUBIN TOTAL: 0.43 mg/dL (ref 0.20–1.20)
BUN: 27.5 mg/dL — AB (ref 7.0–26.0)
CO2: 29 mEq/L (ref 22–29)
Calcium: 9.3 mg/dL (ref 8.4–10.4)
Chloride: 106 mEq/L (ref 98–109)
Creatinine: 1 mg/dL (ref 0.6–1.1)
Glucose: 106 mg/dl (ref 70–140)
POTASSIUM: 3.8 meq/L (ref 3.5–5.1)
Sodium: 142 mEq/L (ref 136–145)
Total Protein: 6.9 g/dL (ref 6.4–8.3)

## 2014-06-02 NOTE — Telephone Encounter (Signed)
gv adn printed appt scehd and avs for pt for Aug...gv pt barium

## 2014-06-02 NOTE — Progress Notes (Signed)
[place] Telephone:(336) [phone]   Fax:(336) [phone]  OFFICE PROGRESS NOTE  [name], MD [place] [phone]  DIAGNOSIS: Metastatic non-small cell lung cancer, adenocarcinoma with positive EGFR mutation in exon 19 and negative ALK gene translocation diagnosed in July 2013 with bone metastasis.   PRIOR THERAPY: Status post radiotherapy to the left face rib metastasis under the care of Dr. Lisbeth Renshaw.   CURRENT THERAPY:  1) treatment with Tarceva 150 mg by mouth daily, therapy beginning 07/20/2012. Status post approximately 18 months of therapy.  2) Xgeva 120 mg subcutaneously every 4 weeks for bone disease.   CHEMOTHERAPY INTENT: Palliative  CURRENT # OF CHEMOTHERAPY CYCLES: 19 CURRENT ANTIEMETICS: Compazine  CURRENT SMOKING STATUS: Never smoker  ORAL CHEMOTHERAPY AND CONSENT: Tarceva  CURRENT BISPHOSPHONATES USE: Xgeva  PAIN MANAGEMENT: None  NARCOTICS INDUCED CONSTIPATION: None  LIVING WILL AND CODE STATUS: Full code  INTERVAL HISTORY: Jacqueline Leonard 68 y.o. female returns to the clinic today for monthly followup visit accompanied by her husband. She is tolerating her current treatment with Tarceva 150 mg by mouth daily fairly well with no significant adverse effects except for very mild skin rash. She denied having any significant diarrhea. She has no fever or chills, no nausea or vomiting. She has no weight loss or night sweats. The patient denied having any significant chest pain, shortness of breath, cough or hemoptysis.   MEDICAL HISTORY: Past Medical History  Diagnosis Date  . Lung mass   . Hypertension   . Hyperlipidemia   . Vitamin D deficiency   . Radiation 07/11/12-07/24/12    Palliative lung tx 30 gray in 10 fx  . Status post chemoradiation     Tarceva  . External hemorrhoids   . Cancer, metastatic to bone 06/19/12    bx=L 5th ribmetastatic Adenocarcinoma with known lung mass    ALLERGIES:  is allergic to  penicillins.  MEDICATIONS:  Current Outpatient Prescriptions  Medication Sig Dispense Refill  . amLODipine (NORVASC) 5 MG tablet Take 5 mg by mouth daily.      . cholecalciferol (VITAMIN D) 1000 UNITS tablet Take 1,000 Units by mouth daily.      Marland Kitchen erlotinib (TARCEVA) 150 MG tablet Take 1 tablet (150 mg total) by mouth daily. Take on an empty stomach 1 hour before meals or 2 hours after.  90 tablet  2  . Multiple Minerals-Vitamins (CALCIUM & VIT D3 BONE HEALTH PO) Take 750 mg by mouth 2 (two) times daily.      . polyethylene glycol (MIRALAX / GLYCOLAX) packet Take 17 g by mouth daily as needed.      . potassium chloride (K-DUR) 10 MEQ tablet Take 1 tablet by mouth daily.      . valsartan-hydrochlorothiazide (DIOVAN-HCT) 160-25 MG per tablet Take 1 tablet by mouth daily.       No current facility-administered medications for this visit.    SURGICAL HISTORY:  Past Surgical History  Procedure Laterality Date  . Soft tissue biopsy  06/19/12    L 5th rib=metastatic adenocarcinoma  . Ovarian cyst removal      REVIEW OF SYSTEMS:  Constitutional: negative Eyes: negative Ears, nose, mouth, throat, and face: negative Respiratory: negative Cardiovascular: negative Gastrointestinal: negative Genitourinary:negative Integument/breast: positive for dryness and rash Hematologic/lymphatic: negative Musculoskeletal:negative Neurological: negative Behavioral/Psych: negative Endocrine: negative Allergic/Immunologic: negative   PHYSICAL EXAMINATION: General appearance: alert, cooperative and no distress Head: Normocephalic, without obvious abnormality, atraumatic Neck: no adenopathy, no JVD,  supple, symmetrical, trachea midline and thyroid not enlarged, symmetric, no tenderness/mass/nodules Lymph nodes: Cervical, supraclavicular, and axillary nodes normal. Resp: clear to auscultation bilaterally Back: symmetric, no curvature. ROM normal. No CVA tenderness. Cardio: regular rate and rhythm, S1,  S2 normal, no murmur, click, rub or gallop GI: soft, non-tender; bowel sounds normal; no masses,  no organomegaly Extremities: extremities normal, atraumatic, no cyanosis or edema Neurologic: Alert and oriented X 3, normal strength and tone. Normal symmetric reflexes. Normal coordination and gait  ECOG PERFORMANCE STATUS: 1 - Symptomatic but completely ambulatory  Blood pressure 125/54, pulse 65, temperature 97 F (36.1 C), temperature source Oral, resp. rate 18, height _0  (1.575 m), weight 162 lb 3.2 oz (73.573 kg).  LABORATORY DATA: Lab Results  Component Value Date   WBC 5.7 06/02/2014   HGB 10.8* 06/02/2014   HCT 33.2* 06/02/2014   MCV 89.8 06/02/2014   PLT 208 06/02/2014      Chemistry      Component Value Date/Time   NA 140 05/05/2014 1527   NA 136 09/23/2012 1525   K 4.0 05/05/2014 1527   K 4.0 09/23/2012 1525   CL 102 05/06/2013 1511   CL 101 09/23/2012 1525   CO2 28 05/05/2014 1527   CO2 26 09/23/2012 1525   BUN 31.0* 05/05/2014 1527   BUN 30* 09/23/2012 1525   CREATININE 0.9 05/05/2014 1527   CREATININE 0.84 09/23/2012 1525      Component Value Date/Time   CALCIUM 8.9 05/05/2014 1527   CALCIUM 8.9 09/23/2012 1525   ALKPHOS 34* 05/05/2014 1527   ALKPHOS 52 09/23/2012 1525   AST 20 05/05/2014 1527   AST 24 09/23/2012 1525   ALT 24 05/05/2014 1527   ALT 31 09/23/2012 1525   BILITOT 0.32 05/05/2014 1527   BILITOT 0.8 09/23/2012 1525       RADIOGRAPHIC STUDIES:  ASSESSMENT AND PLAN: This is a very pleasant 68 years old Hispanic female with metastatic non-small cell lung cancer, adenocarcinoma currently on treatment with Tarceva 150 mg by mouth daily status post 18 months of treatment and tolerating it fairly well.  I recommended for the patient to continue her treatment with Tarceva with the same dose. She will come back for followup visit in one month after repeating CT scan of the chest, abdomen and pelvis for restaging of her disease. For the metastatic bone disease, she will  continue on Xgeva as previously scheduled.  For the iron deficiency, the patient will continue on over the counter ferrous sulfate. She was advised to call immediately if she has any concerning symptoms in the interval.  The patient voices understanding of current disease status and treatment options and is in agreement with the current care plan.  All questions were answered. The patient knows to call the clinic with any problems, questions or concerns. We can certainly see the patient much sooner if necessary.  Disclaimer: This note was dictated with voice recognition software. Similar sounding words can inadvertently be transcribed and may not be corrected upon review.

## 2014-06-03 ENCOUNTER — Other Ambulatory Visit: Payer: 59

## 2014-06-03 ENCOUNTER — Ambulatory Visit: Payer: 59 | Admitting: Internal Medicine

## 2014-06-29 ENCOUNTER — Encounter (HOSPITAL_COMMUNITY): Payer: Self-pay

## 2014-06-29 ENCOUNTER — Ambulatory Visit (HOSPITAL_COMMUNITY)
Admission: RE | Admit: 2014-06-29 | Discharge: 2014-06-29 | Disposition: A | Discharge status: Home / Self Care | Payer: 59 | Source: Ambulatory Visit | Attending: Internal Medicine | Admitting: Internal Medicine

## 2014-06-29 ENCOUNTER — Other Ambulatory Visit (HOSPITAL_BASED_OUTPATIENT_CLINIC_OR_DEPARTMENT_OTHER): Payer: 59

## 2014-06-29 DIAGNOSIS — C799 Secondary malignant neoplasm of unspecified site: Secondary | ICD-10-CM

## 2014-06-29 DIAGNOSIS — C7952 Secondary malignant neoplasm of bone marrow: Secondary | ICD-10-CM

## 2014-06-29 DIAGNOSIS — C801 Malignant (primary) neoplasm, unspecified: Secondary | ICD-10-CM | POA: Insufficient documentation

## 2014-06-29 DIAGNOSIS — C3430 Malignant neoplasm of lower lobe, unspecified bronchus or lung: Secondary | ICD-10-CM

## 2014-06-29 DIAGNOSIS — C7951 Secondary malignant neoplasm of bone: Secondary | ICD-10-CM

## 2014-06-29 DIAGNOSIS — D509 Iron deficiency anemia, unspecified: Secondary | ICD-10-CM

## 2014-06-29 LAB — CBC WITH DIFFERENTIAL/PLATELET
BASO%: 0.5 % (ref 0.0–2.0)
BASOS ABS: 0 10*3/uL (ref 0.0–0.1)
EOS%: 3 % (ref 0.0–7.0)
Eosinophils Absolute: 0.2 10*3/uL (ref 0.0–0.5)
HEMATOCRIT: 31.6 % — AB (ref 34.8–46.6)
HGB: 10.5 g/dL — ABNORMAL LOW (ref 11.6–15.9)
LYMPH%: 18 % (ref 14.0–49.7)
MCH: 29.7 pg (ref 25.1–34.0)
MCHC: 33.1 g/dL (ref 31.5–36.0)
MCV: 89.9 fL (ref 79.5–101.0)
MONO#: 0.5 10*3/uL (ref 0.1–0.9)
MONO%: 7.8 % (ref 0.0–14.0)
NEUT#: 4.5 10*3/uL (ref 1.5–6.5)
NEUT%: 70.7 % (ref 38.4–76.8)
PLATELETS: 226 10*3/uL (ref 145–400)
RBC: 3.52 10*6/uL — ABNORMAL LOW (ref 3.70–5.45)
RDW: 13.3 % (ref 11.2–14.5)
WBC: 6.4 10*3/uL (ref 3.9–10.3)
lymph#: 1.2 10*3/uL (ref 0.9–3.3)

## 2014-06-29 LAB — COMPREHENSIVE METABOLIC PANEL (CC13)
ALK PHOS: 34 U/L — AB (ref 40–150)
ALT: 22 U/L (ref 0–55)
AST: 22 U/L (ref 5–34)
Albumin: 3.4 g/dL — ABNORMAL LOW (ref 3.5–5.0)
Anion Gap: 5 mEq/L (ref 3–11)
BILIRUBIN TOTAL: 0.63 mg/dL (ref 0.20–1.20)
BUN: 25.7 mg/dL (ref 7.0–26.0)
CO2: 30 meq/L — AB (ref 22–29)
Calcium: 9.1 mg/dL (ref 8.4–10.4)
Chloride: 102 mEq/L (ref 98–109)
Creatinine: 0.9 mg/dL (ref 0.6–1.1)
Glucose: 90 mg/dl (ref 70–140)
Potassium: 3.7 mEq/L (ref 3.5–5.1)
Sodium: 138 mEq/L (ref 136–145)
Total Protein: 6.9 g/dL (ref 6.4–8.3)

## 2014-06-29 MED ORDER — IOHEXOL 300 MG/ML  SOLN
100.0000 mL | Freq: Once | INTRAMUSCULAR | Status: AC | PRN
Start: 2014-06-29 — End: 2014-06-29
  Administered 2014-06-29: 100 mL via INTRAVENOUS

## 2014-07-01 ENCOUNTER — Encounter: Payer: Self-pay | Admitting: Internal Medicine

## 2014-07-01 ENCOUNTER — Ambulatory Visit (HOSPITAL_BASED_OUTPATIENT_CLINIC_OR_DEPARTMENT_OTHER): Payer: 59 | Admitting: Internal Medicine

## 2014-07-01 ENCOUNTER — Telehealth: Payer: Self-pay | Admitting: Internal Medicine

## 2014-07-01 VITALS — BP 118/31 | HR 68 | Temp 97.7°F | Resp 18 | Ht 62.0 in | Wt 162.1 lb

## 2014-07-01 DIAGNOSIS — C7951 Secondary malignant neoplasm of bone: Secondary | ICD-10-CM

## 2014-07-01 DIAGNOSIS — C799 Secondary malignant neoplasm of unspecified site: Secondary | ICD-10-CM

## 2014-07-01 DIAGNOSIS — D509 Iron deficiency anemia, unspecified: Secondary | ICD-10-CM

## 2014-07-01 DIAGNOSIS — C7952 Secondary malignant neoplasm of bone marrow: Secondary | ICD-10-CM

## 2014-07-01 DIAGNOSIS — R21 Rash and other nonspecific skin eruption: Secondary | ICD-10-CM

## 2014-07-01 DIAGNOSIS — C3430 Malignant neoplasm of lower lobe, unspecified bronchus or lung: Secondary | ICD-10-CM

## 2014-07-01 NOTE — Progress Notes (Signed)
[facility] [phone]   Fax:([phone]) [phone]  OFFICE PROGRESS NOTE  [name], MD [place] [id number]  DIAGNOSIS: Metastatic non-small cell lung cancer, adenocarcinoma with positive EGFR mutation in exon 19 and negative ALK gene translocation diagnosed in July 2013 with bone metastasis.   PRIOR THERAPY: Status post radiotherapy to the left face rib metastasis under the care of Dr. Lisbeth Renshaw.   CURRENT THERAPY:  1) treatment with Tarceva 150 mg by mouth daily, therapy beginning 07/20/2012. Status post approximately 19 months of therapy.  2) Xgeva 120 mg subcutaneously every 4 weeks for bone disease.   CHEMOTHERAPY INTENT: Palliative  CURRENT # OF CHEMOTHERAPY CYCLES: 20 CURRENT ANTIEMETICS: Compazine  CURRENT SMOKING STATUS: Never smoker  ORAL CHEMOTHERAPY AND CONSENT: Tarceva  CURRENT BISPHOSPHONATES USE: Xgeva  PAIN MANAGEMENT: None  NARCOTICS INDUCED CONSTIPATION: None  LIVING WILL AND CODE STATUS: Full code  INTERVAL HISTORY: Jacqueline Leonard 68 y.o. female returns to the clinic today for monthly followup visit accompanied by her husband. She is currently on treatment with Tarceva 150 mg by mouth daily fairly well with no significant adverse effects except for very mild skin rash. She denied having any significant diarrhea. She has no fever or chills, no nausea or vomiting. She has no weight loss or night sweats. The patient denied having any significant chest pain, shortness of breath, cough or hemoptysis. She had repeat CT scan of the chest, abdomen and pelvis performed recently and she is here for evaluation and discussion of her scan results.  MEDICAL HISTORY: Past Medical History  Diagnosis Date  . Lung mass   . Hypertension   . Hyperlipidemia   . Vitamin D deficiency   . Radiation 07/11/12-07/24/12    Palliative lung tx 30 gray in 10 fx  . Status post chemoradiation     Tarceva  . External hemorrhoids   .  Cancer, metastatic to bone 06/19/12    bx=L 5th ribmetastatic Adenocarcinoma with known lung mass    ALLERGIES:  is allergic to penicillins.  MEDICATIONS:  Current Outpatient Prescriptions  Medication Sig Dispense Refill  . amLODipine (NORVASC) 5 MG tablet Take 5 mg by mouth daily.      . cholecalciferol (VITAMIN D) 1000 UNITS tablet Take 1,000 Units by mouth daily.      Marland Kitchen erlotinib (TARCEVA) 150 MG tablet Take 1 tablet (150 mg total) by mouth daily. Take on an empty stomach 1 hour before meals or 2 hours after.  90 tablet  2  . Multiple Minerals-Vitamins (CALCIUM & VIT D3 BONE HEALTH PO) Take 750 mg by mouth 2 (two) times daily.      . polyethylene glycol (MIRALAX / GLYCOLAX) packet Take 17 g by mouth daily as needed.      . potassium chloride (K-DUR) 10 MEQ tablet Take 1 tablet by mouth daily.      . valsartan-hydrochlorothiazide (DIOVAN-HCT) 160-25 MG per tablet Take 1 tablet by mouth daily.       No current facility-administered medications for this visit.    SURGICAL HISTORY:  Past Surgical History  Procedure Laterality Date  . Soft tissue biopsy  06/19/12    L 5th rib=metastatic adenocarcinoma  . Ovarian cyst removal      REVIEW OF SYSTEMS:  Constitutional: negative Eyes: negative Ears, nose, mouth, throat, and face: negative Respiratory: negative Cardiovascular: negative Gastrointestinal: negative Genitourinary:negative Integument/breast: positive for dryness and rash Hematologic/lymphatic: negative Musculoskeletal:negative Neurological: negative Behavioral/Psych: negative Endocrine: negative Allergic/Immunologic:  negative   PHYSICAL EXAMINATION: General appearance: alert, cooperative and no distress Head: Normocephalic, without obvious abnormality, atraumatic Neck: no adenopathy, no JVD, supple, symmetrical, trachea midline and thyroid not enlarged, symmetric, no tenderness/mass/nodules Lymph nodes: Cervical, supraclavicular, and axillary nodes normal. Resp: clear  to auscultation bilaterally Back: symmetric, no curvature. ROM normal. No CVA tenderness. Cardio: regular rate and rhythm, S1, S2 normal, no murmur, click, rub or gallop GI: soft, non-tender; bowel sounds normal; no masses,  no organomegaly Extremities: extremities normal, atraumatic, no cyanosis or edema Neurologic: Alert and oriented X 3, normal strength and tone. Normal symmetric reflexes. Normal coordination and gait  ECOG PERFORMANCE STATUS: 1 - Symptomatic but completely ambulatory  Blood pressure 118/31, pulse 68, temperature 97.7 F (36.5 C), temperature source Oral, resp. rate 18, height _0  (1.575 m), weight 162 lb 1.6 oz (73.528 kg), SpO2 100.00%.  LABORATORY DATA: Lab Results  Component Value Date   WBC 6.4 06/29/2014   HGB 10.5* 06/29/2014   HCT 31.6* 06/29/2014   MCV 89.9 06/29/2014   PLT 226 06/29/2014      Chemistry      Component Value Date/Time   NA 138 06/29/2014 1517   NA 136 09/23/2012 1525   K 3.7 06/29/2014 1517   K 4.0 09/23/2012 1525   CL 102 05/06/2013 1511   CL 101 09/23/2012 1525   CO2 30* 06/29/2014 1517   CO2 26 09/23/2012 1525   BUN 25.7 06/29/2014 1517   BUN 30* 09/23/2012 1525   CREATININE 0.9 06/29/2014 1517   CREATININE 0.84 09/23/2012 1525      Component Value Date/Time   CALCIUM 9.1 06/29/2014 1517   CALCIUM 8.9 09/23/2012 1525   ALKPHOS 34* 06/29/2014 1517   ALKPHOS 52 09/23/2012 1525   AST 22 06/29/2014 1517   AST 24 09/23/2012 1525   ALT 22 06/29/2014 1517   ALT 31 09/23/2012 1525   BILITOT 0.63 06/29/2014 1517   BILITOT 0.8 09/23/2012 1525       RADIOGRAPHIC STUDIES: Ct Chest W Contrast  06/29/2014   CLINICAL DATA:  Followup metastatic lung adenocarcinoma. Chemotherapy in progress. Previous radiation therapy.  EXAM: CT CHEST, ABDOMEN, AND PELVIS WITH CONTRAST  TECHNIQUE: Multidetector CT imaging of the chest, abdomen and pelvis was performed following the standard protocol during bolus administration of intravenous contrast.  CONTRAST:  177m  OMNIPAQUE IOHEXOL 300 MG/ML  SOLN  COMPARISON:  03/31/2014  FINDINGS: CT CHEST FINDINGS  Post treatment changes in the posterior medial left lower lobe remains stable in appearance. Under adjacent solid pulmonary nodule now measures 14 mm on image 31 compared to 11 mm previously, suspicious for carcinoma.  Other tiny sub-cm pulmonary nodules are seen scattered throughout both lungs which show no significant change. No other enlarging or new pulmonary nodules are identified. No evidence of pleural or pericardial effusion.  No evidence of hilar or mediastinal lymphadenopathy. No adenopathy seen elsewhere within the thorax. Sclerotic bone metastasis involving the left lateral fourth rib and lower thoracic spine are stable.  CT ABDOMEN AND PELVIS FINDINGS  The liver, gallbladder, pancreas, spleen, and adrenal glands remain normal in appearance. No evidence of hydronephrosis. Left renal cyst remains stable but there is no evidence of renal masses.  Small calcified uterine fibroid is stable. No other soft tissue masses or lymphadenopathy identified within the abdomen or pelvis. No evidence of inflammatory process or abnormal fluid collections. No evidence of dilated bowel loops.  Small sclerotic bone metastases in the lumbar spine remains stable in appearance. Bilateral  L5 pars defects and associated spondylosis are stable.   IMPRESSION: Further increase in size of 14 mm pulmonary nodule in the posterior medial left lower lobe, highly suspicious for carcinoma.  Stable appearance of sclerotic bone metastases.  Stable small calcified uterine fibroid.   Electronically Signed   By: Earle Gell M.D.   On: 06/29/2014 17:12   ASSESSMENT AND PLAN: This is a very pleasant 68 years old Hispanic female with metastatic non-small cell lung cancer, adenocarcinoma currently on treatment with Tarceva 150 mg by mouth daily status post 19 months of treatment and tolerating it fairly well.  Her recent CT scan of the chest, abdomen and  pelvis showed no significant evidence for disease progression except for mild increase in the size of posterior medial left lower lobe lesion currently measuring 1.4 CM compared to 1.1 CM on the prior scan. I discussed the scan results and showed the images to the patient and her husband. I recommended for the patient to continue her treatment with Tarceva with the same dose but in the meantime I would refer the patient to Dr. Lisbeth Renshaw for consideration of stereotactic radiotherapy to the enlarging left lower lobe lung lesion. The patient agreed with this plan. For the metastatic bone disease, she will continue on Xgeva as previously scheduled.  For the iron deficiency, the patient will continue on over the counter ferrous sulfate. She would come back for followup visit in one month for reevaluation and management any adverse effect of her treatment She was advised to call immediately if she has any concerning symptoms in the interval.  The patient voices understanding of current disease status and treatment options and is in agreement with the current care plan.  All questions were answered. The patient knows to call the clinic with any problems, questions or concerns. We can certainly see the patient much sooner if necessary.  Disclaimer: This note was dictated with voice recognition software. Similar sounding words can inadvertently be transcribed and may not be corrected upon review.

## 2014-07-01 NOTE — Telephone Encounter (Signed)
Pt confirmed labs/ov per 08/05 POF, gave pt AVS....KJ °

## 2014-07-09 NOTE — Progress Notes (Signed)
Thoracic Location of Tumor / Histology: Re consult Metastatic  Non-small cell lung cancer-Enlarging left lower lobe lung   Patient presented months ago with symptoms of:   CT Chest with Contrast 06/29/14:  IMPRESSION:  Further increase in size of 14 mm pulmonary nodule in the posterior  medial left lower lobe, highly suspicious for carcinoma.   Tobacco/Marijuana/Snuff/ETOH use:non smoker, no smokeless tobacco, non alcohol, no illicit drugs   Past/Anticipated interventions by cardiothoracic surgery, if any:   Past/Anticipated interventions by medical oncology, if any:  Dr. Julien Nordmann note :CURRENT THERAPY: 06/29/14   1) treatment with Tarceva 150 mg by mouth daily, therapy beginning 07/20/2012. Status post approximately 19 months of therapy.  2) Xgeva 120 mg subcutaneously every 4 weeks for bone disease.   CHEMOTHERAPY INTENT: Palliative  CURRENT # OF CHEMOTHERAPY CYCLES: 20  CURRENT ANTIEMETICS: Compazine  CURRENT SMOKING STATUS: Never smoker  ORAL CHEMOTHERAPY AND CONSENT: Tarceva  CURRENT BISPHOSPHONATES USE: Xgeva    Referral to Have SBRT Left lower lobe lesion /lung  Signs/Symptoms  Weight changes, if any: none  Respiratory complaints, if any: no  100% room air,  Hemoptysis, if any: No  Pain issues, if any:  None,   SAFETY ISSUES:  Prior radiation? Yes, 07/11/12-07/24/12  Left lateral rib/30 Gy/59fractions   Pacemaker/ICD?no  Possible current pregnancy?no  Is the patient on methotrexate? no  Current Complaints / other details:  Reconsult  Has rash on face/arms, states comes and goes from Tarceva pill ,   Allergies : PCNS=rash

## 2014-07-13 ENCOUNTER — Encounter: Payer: Self-pay | Admitting: Radiation Oncology

## 2014-07-13 ENCOUNTER — Ambulatory Visit
Admission: RE | Admit: 2014-07-13 | Discharge: 2014-07-13 | Disposition: A | Discharge status: Home / Self Care | Payer: 59 | Source: Ambulatory Visit | Attending: Radiation Oncology | Admitting: Radiation Oncology

## 2014-07-13 ENCOUNTER — Ambulatory Visit: Payer: 59 | Admitting: Radiation Oncology

## 2014-07-13 VITALS — BP 127/53 | HR 61 | Temp 97.8°F | Resp 20 | Ht 62.0 in | Wt 160.9 lb

## 2014-07-13 DIAGNOSIS — C7952 Secondary malignant neoplasm of bone marrow: Secondary | ICD-10-CM | POA: Diagnosis not present

## 2014-07-13 DIAGNOSIS — C7951 Secondary malignant neoplasm of bone: Secondary | ICD-10-CM | POA: Diagnosis not present

## 2014-07-13 DIAGNOSIS — C7802 Secondary malignant neoplasm of left lung: Secondary | ICD-10-CM

## 2014-07-13 DIAGNOSIS — C3430 Malignant neoplasm of lower lobe, unspecified bronchus or lung: Secondary | ICD-10-CM | POA: Diagnosis not present

## 2014-07-13 DIAGNOSIS — Z880 Allergy status to penicillin: Secondary | ICD-10-CM | POA: Insufficient documentation

## 2014-07-13 DIAGNOSIS — Z510 Encounter for antineoplastic radiation therapy: Secondary | ICD-10-CM | POA: Diagnosis present

## 2014-07-13 HISTORY — DX: Allergy, unspecified, initial encounter: T78.40XA

## 2014-07-13 HISTORY — DX: Anxiety disorder, unspecified: F41.9

## 2014-07-13 NOTE — Progress Notes (Signed)
Please see the Nurse Progress Note in the MD Initial Consult Encounter for this patient. 

## 2014-07-15 ENCOUNTER — Ambulatory Visit
Admission: RE | Admit: 2014-07-15 | Discharge: 2014-07-15 | Disposition: A | Discharge status: Home / Self Care | Payer: 59 | Source: Ambulatory Visit | Attending: Radiation Oncology | Admitting: Radiation Oncology

## 2014-07-15 DIAGNOSIS — Z510 Encounter for antineoplastic radiation therapy: Secondary | ICD-10-CM | POA: Diagnosis not present

## 2014-07-15 DIAGNOSIS — C7802 Secondary malignant neoplasm of left lung: Secondary | ICD-10-CM

## 2014-07-16 DIAGNOSIS — C7800 Secondary malignant neoplasm of unspecified lung: Secondary | ICD-10-CM | POA: Insufficient documentation

## 2014-07-16 NOTE — Progress Notes (Signed)
Radiation Oncology         [phone] ________________________________  Name: Jacqueline Leonard MRN: [id number]  Date: 07/13/2014  DOB: [birth year]  Re-evaluation Note  CC: [name], MD  Curt Bears, MD  Diagnosis:   Metastatic non-small cell lung cancer  Narrative:  The patient returns today for reevaluation. She previously received palliative radiation treatment for a left lateral rib metastasis. The patient has continued with systemic treatment with Dr. Julien Nordmann. Recently she has been on Tarceva medication which has worked well for her. The patient recently underwent restaging studies on 06/29/2014. There was a further increase in the size of a 14 mm pulmonary nodule in the posterior medial left lower lobe. Otherwise the was stable. The patient has done excellent with Tarceva and this appears to be dominant area of progression at this point. Therefore I have been asked to see the patient for consideration of possible stereotactic body radiation treatment at this time to this single pulmonary lesion of concern.                           ALLERGIES:  is allergic to penicillins.  Meds: Current Outpatient Prescriptions  Medication Sig Dispense Refill  . amLODipine (NORVASC) 5 MG tablet Take 5 mg by mouth daily.      . cholecalciferol (VITAMIN D) 1000 UNITS tablet Take 1,000 Units by mouth daily.      Marland Kitchen erlotinib (TARCEVA) 150 MG tablet Take 1 tablet (150 mg total) by mouth daily. Take on an empty stomach 1 hour before meals or 2 hours after.  90 tablet  2  . Multiple Minerals-Vitamins (CALCIUM & VIT D3 BONE HEALTH PO) Take 750 mg by mouth 2 (two) times daily.      . potassium chloride (K-DUR) 10 MEQ tablet Take 1 tablet by mouth daily.      . valsartan-hydrochlorothiazide (DIOVAN-HCT) 160-25 MG per tablet Take 1 tablet by mouth daily.      . polyethylene glycol (MIRALAX / GLYCOLAX) packet Take 17 g by mouth daily as needed.       No current facility-administered medications for  this encounter.    Physical Findings: The patient is in no acute distress. Patient is alert and oriented.  height is 5\' 2"  (1.575 m) and weight is 160 lb 14.4 oz (72.984 kg). Her oral temperature is 97.8 F (36.6 C). Her blood pressure is 127/53 and her pulse is 61. Her respiration is 20 and oxygen saturation is 100%. .   General: Well-developed, in no acute distress HEENT: Normocephalic, atraumatic Cardiovascular: Regular rate and rhythm Respiratory: Clear to auscultation bilaterally GI: Soft, nontender, normal bowel sounds Extremities: No edema present   Lab Findings: Lab Results  Component Value Date   WBC 6.4 06/29/2014   HGB 10.5* 06/29/2014   HCT 31.6* 06/29/2014   MCV 89.9 06/29/2014   PLT 226 06/29/2014     Radiographic Findings: Ct Chest W Contrast  06/29/2014   CLINICAL DATA:  Followup metastatic lung adenocarcinoma. Chemotherapy in progress. Previous radiation therapy.  EXAM: CT CHEST, ABDOMEN, AND PELVIS WITH CONTRAST  TECHNIQUE: Multidetector CT imaging of the chest, abdomen and pelvis was performed following the standard protocol during bolus administration of intravenous contrast.  CONTRAST:  119mL OMNIPAQUE IOHEXOL 300 MG/ML  SOLN  COMPARISON:  03/31/2014  FINDINGS: CT CHEST FINDINGS  Post treatment changes in the posterior medial left lower lobe remains stable in appearance. Under adjacent solid pulmonary nodule now measures 14 mm on  image 31 compared to 11 mm previously, suspicious for carcinoma.  Other tiny sub-cm pulmonary nodules are seen scattered throughout both lungs which show no significant change. No other enlarging or new pulmonary nodules are identified. No evidence of pleural or pericardial effusion.  No evidence of hilar or mediastinal lymphadenopathy. No adenopathy seen elsewhere within the thorax. Sclerotic bone metastasis involving the left lateral fourth rib and lower thoracic spine are stable.  CT ABDOMEN AND PELVIS FINDINGS  The liver, gallbladder, pancreas,  spleen, and adrenal glands remain normal in appearance. No evidence of hydronephrosis. Left renal cyst remains stable but there is no evidence of renal masses.  Small calcified uterine fibroid is stable. No other soft tissue masses or lymphadenopathy identified within the abdomen or pelvis. No evidence of inflammatory process or abnormal fluid collections. No evidence of dilated bowel loops.  Small sclerotic bone metastases in the lumbar spine remains stable in appearance. Bilateral L5 pars defects and associated spondylosis are stable.  IMPRESSION: Further increase in size of 14 mm pulmonary nodule in the posterior medial left lower lobe, highly suspicious for carcinoma.  Stable appearance of sclerotic bone metastases.  Stable small calcified uterine fibroid.   Electronically Signed   By: Earle Gell M.D.   On: 06/29/2014 17:12   Ct Abdomen Pelvis W Contrast  06/29/2014   CLINICAL DATA:  Followup metastatic lung adenocarcinoma. Chemotherapy in progress. Previous radiation therapy.  EXAM: CT CHEST, ABDOMEN, AND PELVIS WITH CONTRAST  TECHNIQUE: Multidetector CT imaging of the chest, abdomen and pelvis was performed following the standard protocol during bolus administration of intravenous contrast.  CONTRAST:  165mL OMNIPAQUE IOHEXOL 300 MG/ML  SOLN  COMPARISON:  03/31/2014  FINDINGS: CT CHEST FINDINGS  Post treatment changes in the posterior medial left lower lobe remains stable in appearance. Under adjacent solid pulmonary nodule now measures 14 mm on image 31 compared to 11 mm previously, suspicious for carcinoma.  Other tiny sub-cm pulmonary nodules are seen scattered throughout both lungs which show no significant change. No other enlarging or new pulmonary nodules are identified. No evidence of pleural or pericardial effusion.  No evidence of hilar or mediastinal lymphadenopathy. No adenopathy seen elsewhere within the thorax. Sclerotic bone metastasis involving the left lateral fourth rib and lower thoracic  spine are stable.  CT ABDOMEN AND PELVIS FINDINGS  The liver, gallbladder, pancreas, spleen, and adrenal glands remain normal in appearance. No evidence of hydronephrosis. Left renal cyst remains stable but there is no evidence of renal masses.  Small calcified uterine fibroid is stable. No other soft tissue masses or lymphadenopathy identified within the abdomen or pelvis. No evidence of inflammatory process or abnormal fluid collections. No evidence of dilated bowel loops.  Small sclerotic bone metastases in the lumbar spine remains stable in appearance. Bilateral L5 pars defects and associated spondylosis are stable.  IMPRESSION: Further increase in size of 14 mm pulmonary nodule in the posterior medial left lower lobe, highly suspicious for carcinoma.  Stable appearance of sclerotic bone metastases.  Stable small calcified uterine fibroid.   Electronically Signed   By: Earle Gell M.D.   On: 06/29/2014 17:12    Impression:    Stage IV non-small cell lung cancer  The patient has a history of metastatic non-small cell lung cancer. She has a single area of progressive disease within the chest corresponding to the left posterior medial lung.  The patient has continued on Tarceva.  I believe that the patient is a good candidate for stereotactic body radiation  treatment to the single lesion. I discussed the rationale and potential benefit of such a treatment. I also discussed the possible side effects and risks of treatment. All of her questions were answered. The patient is eager to begin treatment as soon as possible.  Plan:  The patient will be scheduled for a simulation as soon as possible such that we can proceed with treatment planning.  I spent 30 minutes with the patient today, the majority of which was spent counseling the patient on the diagnosis of cancer and coordinating care.   [name], M.D., Ph.D.

## 2014-07-20 DIAGNOSIS — Z510 Encounter for antineoplastic radiation therapy: Secondary | ICD-10-CM | POA: Diagnosis not present

## 2014-07-21 DIAGNOSIS — Z510 Encounter for antineoplastic radiation therapy: Secondary | ICD-10-CM | POA: Diagnosis not present

## 2014-07-22 ENCOUNTER — Ambulatory Visit
Admission: RE | Admit: 2014-07-22 | Discharge: 2014-07-22 | Disposition: A | Discharge status: Home / Self Care | Payer: 59 | Source: Ambulatory Visit | Attending: Radiation Oncology | Admitting: Radiation Oncology

## 2014-07-22 DIAGNOSIS — C7802 Secondary malignant neoplasm of left lung: Secondary | ICD-10-CM

## 2014-07-22 DIAGNOSIS — Z510 Encounter for antineoplastic radiation therapy: Secondary | ICD-10-CM | POA: Diagnosis not present

## 2014-07-22 NOTE — Progress Notes (Signed)
   Radiation Oncology         [phone] ________________________________  Name: Darcus Edds MRN: [id number]  Date: 07/22/2014  DOB: [birth year]  Stereotactic Body Radiotherapy Treatment Procedure Note   NARRATIVE: Trella Thurmond was brought to the stereotactic radiation treatment machine and placed supine on the CT couch. The patient was set up for stereotactic body radiotherapy on the body fix pillow.   3D TREATMENT PLANNING AND DOSIMETRY: The patient's radiation plan was reviewed and approved prior to starting treatment. It showed 3-dimensional radiation distributions overlaid onto the planning CT. The [facility] for the target structures as well as the organs at risk were reviewed. The documentation of this is filed in the radiation oncology EMR.   SIMULATION VERIFICATION: The patient underwent CT imaging on the treatment unit. These were carefully aligned to document that the ablative radiation dose would cover the target volume and maximally spare the nearby organs at risk according to the planned distribution.   SPECIAL TREATMENT PROCEDURE: Calianna Schoening received high dose ablative stereotactic body radiotherapy to the planned target volume without unforeseen complications. Treatment was delivered uneventfully. The high doses associated with stereotactic body radiotherapy and the significant potential risks require careful treatment set up and patient monitoring constituting a special treatment procedure.   STEREOTACTIC TREATMENT MANAGEMENT: Following delivery, the patient was evaluated clinically. The patient tolerated treatment without significant acute effects, and was discharged to home in stable condition.   Fraction: 1  Dose:  12 Gy  PLAN: Continue treatment as planned.   ________________________________  [name], MD, PhD

## 2014-07-24 ENCOUNTER — Ambulatory Visit
Admission: RE | Admit: 2014-07-24 | Discharge: 2014-07-24 | Disposition: A | Discharge status: Home / Self Care | Payer: 59 | Source: Ambulatory Visit | Attending: Radiation Oncology | Admitting: Radiation Oncology

## 2014-07-24 ENCOUNTER — Encounter: Payer: Self-pay | Admitting: Radiation Oncology

## 2014-07-24 VITALS — BP 143/42 | HR 69 | Temp 97.6°F | Resp 20 | Wt 159.6 lb

## 2014-07-24 DIAGNOSIS — C7802 Secondary malignant neoplasm of left lung: Secondary | ICD-10-CM

## 2014-07-24 DIAGNOSIS — Z510 Encounter for antineoplastic radiation therapy: Secondary | ICD-10-CM | POA: Diagnosis not present

## 2014-07-24 NOTE — Progress Notes (Signed)
   Department of Radiation Oncology  Phone:  [phone] Fax:        [phone]  Weekly Treatment Note    Name: Jacqueline Leonard Date: 07/24/2014 MRN: [id number] DOB: [birth year]   Current dose: 24 Gy  Current fraction: 2   MEDICATIONS: Current Outpatient Prescriptions  Medication Sig Dispense Refill  . amLODipine (NORVASC) 5 MG tablet Take 5 mg by mouth daily.      . cholecalciferol (VITAMIN D) 1000 UNITS tablet Take 1,000 Units by mouth daily.      Marland Kitchen erlotinib (TARCEVA) 150 MG tablet Take 1 tablet (150 mg total) by mouth daily. Take on an empty stomach 1 hour before meals or 2 hours after.  90 tablet  2  . Multiple Minerals-Vitamins (CALCIUM & VIT D3 BONE HEALTH PO) Take 750 mg by mouth 2 (two) times daily.      . potassium chloride (K-DUR) 10 MEQ tablet Take 1 tablet by mouth daily.      . valsartan-hydrochlorothiazide (DIOVAN-HCT) 160-25 MG per tablet Take 1 tablet by mouth daily.      . polyethylene glycol (MIRALAX / GLYCOLAX) packet Take 17 g by mouth daily as needed.       No current facility-administered medications for this encounter.     ALLERGIES: Penicillins   LABORATORY DATA:  Lab Results  Component Value Date   WBC 6.4 06/29/2014   HGB 10.5* 06/29/2014   HCT 31.6* 06/29/2014   MCV 89.9 06/29/2014   PLT 226 06/29/2014   Lab Results  Component Value Date   NA 138 06/29/2014   K 3.7 06/29/2014   CL 102 05/06/2013   CO2 30* 06/29/2014   Lab Results  Component Value Date   ALT 22 06/29/2014   AST 22 06/29/2014   ALKPHOS 34* 06/29/2014   BILITOT 0.63 06/29/2014     NARRATIVE: Jacqueline Leonard was seen today for weekly treatment management. The chart was checked and the patient's films were reviewed. The patient is doing well with treatment. No complaints or difficulties so far. All of her questions were answered today.  PHYSICAL EXAMINATION: weight is 159 lb 9.6 oz (72.394 kg). Her oral temperature is 97.6 F (36.4 C). Her blood pressure is 143/42 and her pulse is 69. Her  respiration is 20.        ASSESSMENT: The patient is doing satisfactorily with treatment.  PLAN: We will continue with the patient's radiation treatment as planned.

## 2014-07-24 NOTE — Progress Notes (Signed)
   Radiation Oncology         [phone]) [phone] ________________________________  Name: Jacqueline Leonard MRN: [id number]  Date: 07/24/2014  DOB: Sep 27, 1946  Stereotactic Body Radiotherapy Treatment Procedure Note   NARRATIVE: Jacqueline Leonard was brought to the stereotactic radiation treatment machine and placed supine on the CT couch. The patient was set up for stereotactic body radiotherapy on the body fix pillow.   3D TREATMENT PLANNING AND DOSIMETRY: The patient's radiation plan was reviewed and approved prior to starting treatment. It showed 3-dimensional radiation distributions overlaid onto the planning CT. The [facility] for the target structures as well as the organs at risk were reviewed. The documentation of this is filed in the radiation oncology EMR.   SIMULATION VERIFICATION: The patient underwent CT imaging on the treatment unit. These were carefully aligned to document that the ablative radiation dose would cover the target volume and maximally spare the nearby organs at risk according to the planned distribution.   SPECIAL TREATMENT PROCEDURE: Jacqueline Leonard received high dose ablative stereotactic body radiotherapy to the planned target volume without unforeseen complications. Treatment was delivered uneventfully. The high doses associated with stereotactic body radiotherapy and the significant potential risks require careful treatment set up and patient monitoring constituting a special treatment procedure.   STEREOTACTIC TREATMENT MANAGEMENT: Following delivery, the patient was evaluated clinically. The patient tolerated treatment without significant acute effects, and was discharged to home in stable condition.   Fraction: 2  Dose:  24 Gy  PLAN: Continue treatment as planned.   ________________________________  [name], MD, PhD

## 2014-07-24 NOTE — Progress Notes (Signed)
Weekly rad SBRT chest  Left lat rib, , discussed ski irritation minor, may get fatigue after treatments difficulty swallowing ,cough, no c/o coughing, no pain, no nausea, no difficulty swallowing, does have rash on face and arms from Tarceva  Pill ,  11:30 AM

## 2014-07-27 ENCOUNTER — Ambulatory Visit
Admission: RE | Admit: 2014-07-27 | Discharge: 2014-07-27 | Disposition: A | Discharge status: Home / Self Care | Payer: 59 | Source: Ambulatory Visit | Attending: Radiation Oncology | Admitting: Radiation Oncology

## 2014-07-27 DIAGNOSIS — C7802 Secondary malignant neoplasm of left lung: Secondary | ICD-10-CM

## 2014-07-27 DIAGNOSIS — Z510 Encounter for antineoplastic radiation therapy: Secondary | ICD-10-CM | POA: Diagnosis not present

## 2014-07-27 NOTE — Progress Notes (Signed)
   Radiation Oncology         [phone] ________________________________  Name: Jacqueline Leonard MRN: [id number]  Date: 07/27/2014  DOB: [birth year]  Stereotactic Body Radiotherapy Treatment Procedure Note   NARRATIVE: Jacqueline Leonard was brought to the stereotactic radiation treatment machine and placed supine on the CT couch. The patient was set up for stereotactic body radiotherapy on the body fix pillow.   3D TREATMENT PLANNING AND DOSIMETRY: The patient's radiation plan was reviewed and approved prior to starting treatment. It showed 3-dimensional radiation distributions overlaid onto the planning CT. The [facility] for the target structures as well as the organs at risk were reviewed. The documentation of this is filed in the radiation oncology EMR.   SIMULATION VERIFICATION: The patient underwent CT imaging on the treatment unit. These were carefully aligned to document that the ablative radiation dose would cover the target volume and maximally spare the nearby organs at risk according to the planned distribution.   SPECIAL TREATMENT PROCEDURE: Jacqueline Leonard received high dose ablative stereotactic body radiotherapy to the planned target volume without unforeseen complications. Treatment was delivered uneventfully. The high doses associated with stereotactic body radiotherapy and the significant potential risks require careful treatment set up and patient monitoring constituting a special treatment procedure.   STEREOTACTIC TREATMENT MANAGEMENT: Following delivery, the patient was evaluated clinically. The patient tolerated treatment without significant acute effects, and was discharged to home in stable condition.   Fraction: 3  Dose:  36 Gy  PLAN: Continue treatment as planned.   ________________________________  [name], MD, PhD

## 2014-07-27 NOTE — Progress Notes (Signed)
[place] Radiation Oncology Simulation and Treatment Planning Note   Name:  Jacqueline Leonard MRN: [id number]   Date: 07/15/2014  DOB: [birth year]  Status:outpatient    DIAGNOSIS: The encounter diagnosis was Secondary malignant neoplasm of left lung.  SITE:  Left long   CONSENT VERIFIED:yes   SET UP: Patient is setup supine   IMMOBILIZATION: The patient was immobilized using a Vac Loc bag, and a customized accuform device was also constructed to aid in patient immobilization. The patient set up also involved abdominal compression to attempt to reduce respiratory motion. A total of 2 complex treatment devices therefore will be used for immobilization during the course of radiation.    NARRATIVE:The patient was brought to the [facility].  Identity was confirmed.  All relevant records and images related to the planned course of therapy were reviewed.  Then, the patient was positioned in a stable reproducible clinical set-up for radiation therapy. Abdominal compression was applied by me.  4D CT images were obtained and reproducible breathing pattern was confirmed. Free breathing CT images were obtained.  Skin markings were placed.  The CT images were loaded into the planning software where the target and avoidance structures were contoured.  The radiation prescription was entered and confirmed.    TREATMENT PLANNING NOTE:  Treatment planning then occurred. I have requested : MLC's, 3D simulation/ isodose plan, basic dose calculation. It is anticipated that to customized fields will be used for the patient's treatment, with each of these corresponding to an additional complex treatment device.  3 dimensional simulation is performed and dose volume histogram of the gross tumor volume, planning tumor volume and criticial normal structures including the spinal cord and lungs were analyzed and requested.  Special treatment procedure was performed due to high  dose per fraction and the complexity of the planning process.  The patient will be monitored for increased risk of toxicity.  Daily imaging using cone beam CT/ MV CT will be used for target localization.   PLAN:  The patient will receive 60 Gy in 5 fractions.    ________________________________   [name], MD, PhD

## 2014-07-28 ENCOUNTER — Ambulatory Visit: Payer: 59 | Admitting: Radiation Oncology

## 2014-07-29 ENCOUNTER — Encounter: Payer: Self-pay | Admitting: Internal Medicine

## 2014-07-29 ENCOUNTER — Ambulatory Visit: Payer: 59 | Admitting: Radiation Oncology

## 2014-07-29 ENCOUNTER — Ambulatory Visit (HOSPITAL_BASED_OUTPATIENT_CLINIC_OR_DEPARTMENT_OTHER): Payer: 59 | Admitting: Internal Medicine

## 2014-07-29 ENCOUNTER — Ambulatory Visit
Admission: RE | Admit: 2014-07-29 | Discharge: 2014-07-29 | Disposition: A | Discharge status: Home / Self Care | Payer: 59 | Source: Ambulatory Visit | Attending: Radiation Oncology | Admitting: Radiation Oncology

## 2014-07-29 ENCOUNTER — Other Ambulatory Visit (HOSPITAL_BASED_OUTPATIENT_CLINIC_OR_DEPARTMENT_OTHER): Payer: 59

## 2014-07-29 ENCOUNTER — Telehealth: Payer: Self-pay | Admitting: Internal Medicine

## 2014-07-29 VITALS — BP 140/49 | HR 68 | Temp 98.3°F | Resp 19 | Ht 62.0 in | Wt 161.6 lb

## 2014-07-29 DIAGNOSIS — D509 Iron deficiency anemia, unspecified: Secondary | ICD-10-CM

## 2014-07-29 DIAGNOSIS — C799 Secondary malignant neoplasm of unspecified site: Secondary | ICD-10-CM

## 2014-07-29 DIAGNOSIS — C7802 Secondary malignant neoplasm of left lung: Secondary | ICD-10-CM

## 2014-07-29 DIAGNOSIS — C7952 Secondary malignant neoplasm of bone marrow: Secondary | ICD-10-CM

## 2014-07-29 DIAGNOSIS — Z510 Encounter for antineoplastic radiation therapy: Secondary | ICD-10-CM | POA: Diagnosis not present

## 2014-07-29 DIAGNOSIS — C7951 Secondary malignant neoplasm of bone: Secondary | ICD-10-CM

## 2014-07-29 DIAGNOSIS — C3430 Malignant neoplasm of lower lobe, unspecified bronchus or lung: Secondary | ICD-10-CM

## 2014-07-29 LAB — CBC WITH DIFFERENTIAL/PLATELET
BASO%: 0.3 % (ref 0.0–2.0)
Basophils Absolute: 0 10*3/uL (ref 0.0–0.1)
EOS ABS: 0.2 10*3/uL (ref 0.0–0.5)
EOS%: 3.5 % (ref 0.0–7.0)
HEMATOCRIT: 31.8 % — AB (ref 34.8–46.6)
HGB: 10.4 g/dL — ABNORMAL LOW (ref 11.6–15.9)
LYMPH%: 13.9 % — AB (ref 14.0–49.7)
MCH: 29.6 pg (ref 25.1–34.0)
MCHC: 32.7 g/dL (ref 31.5–36.0)
MCV: 90.5 fL (ref 79.5–101.0)
MONO#: 0.5 10*3/uL (ref 0.1–0.9)
MONO%: 8.4 % (ref 0.0–14.0)
NEUT%: 73.9 % (ref 38.4–76.8)
NEUTROS ABS: 4.3 10*3/uL (ref 1.5–6.5)
PLATELETS: 207 10*3/uL (ref 145–400)
RBC: 3.51 10*6/uL — AB (ref 3.70–5.45)
RDW: 13.3 % (ref 11.2–14.5)
WBC: 5.8 10*3/uL (ref 3.9–10.3)
lymph#: 0.8 10*3/uL — ABNORMAL LOW (ref 0.9–3.3)

## 2014-07-29 LAB — COMPREHENSIVE METABOLIC PANEL (CC13)
ALBUMIN: 3.1 g/dL — AB (ref 3.5–5.0)
ALT: 20 U/L (ref 0–55)
ANION GAP: 7 meq/L (ref 3–11)
AST: 20 U/L (ref 5–34)
Alkaline Phosphatase: 42 U/L (ref 40–150)
BILIRUBIN TOTAL: 0.35 mg/dL (ref 0.20–1.20)
BUN: 31.1 mg/dL — ABNORMAL HIGH (ref 7.0–26.0)
CO2: 27 mEq/L (ref 22–29)
Calcium: 8.9 mg/dL (ref 8.4–10.4)
Chloride: 105 mEq/L (ref 98–109)
Creatinine: 0.9 mg/dL (ref 0.6–1.1)
GLUCOSE: 103 mg/dL (ref 70–140)
Potassium: 3.8 mEq/L (ref 3.5–5.1)
Sodium: 139 mEq/L (ref 136–145)
Total Protein: 6.6 g/dL (ref 6.4–8.3)

## 2014-07-29 NOTE — Progress Notes (Signed)
   Radiation Oncology         [phone]) [phone] ________________________________  Name: Faye Strohman MRN: [id number]  Date: 07/29/2014  DOB: 1946/06/07  Stereotactic Body Radiotherapy Treatment Procedure Note   NARRATIVE: Kilee Hedding was brought to the stereotactic radiation treatment machine and placed supine on the CT couch. The patient was set up for stereotactic body radiotherapy on the body fix pillow.   3D TREATMENT PLANNING AND DOSIMETRY: The patient's radiation plan was reviewed and approved prior to starting treatment. It showed 3-dimensional radiation distributions overlaid onto the planning CT. The [facility] for the target structures as well as the organs at risk were reviewed. The documentation of this is filed in the radiation oncology EMR.   SIMULATION VERIFICATION: The patient underwent CT imaging on the treatment unit. These were carefully aligned to document that the ablative radiation dose would cover the target volume and maximally spare the nearby organs at risk according to the planned distribution.   SPECIAL TREATMENT PROCEDURE: Bobette Edman received high dose ablative stereotactic body radiotherapy to the planned target volume without unforeseen complications. Treatment was delivered uneventfully. The high doses associated with stereotactic body radiotherapy and the significant potential risks require careful treatment set up and patient monitoring constituting a special treatment procedure.   STEREOTACTIC TREATMENT MANAGEMENT: Following delivery, the patient was evaluated clinically. The patient tolerated treatment without significant acute effects, and was discharged to home in stable condition.   Fraction: 4  Dose:  48 Gy  PLAN: Continue treatment as planned.   ________________________________  [name], MD, PhD

## 2014-07-29 NOTE — Progress Notes (Signed)
[place] [phone]   [phone]  OFFICE PROGRESS NOTE  [name], MD [place] [phone]  DIAGNOSIS: Metastatic non-small cell lung cancer, adenocarcinoma with positive EGFR mutation in exon 19 and negative ALK gene translocation diagnosed in July 2013 with bone metastasis.   PRIOR THERAPY:  1) Status post radiotherapy to the left face rib metastasis under the care of Dr. Lisbeth Renshaw.  2) stereotactic radiotherapy to the enlarging left lower lobe lung nodule under the care of Dr. Lisbeth Renshaw.  CURRENT THERAPY:  1) treatment with Tarceva 150 mg by mouth daily, therapy beginning 07/20/2012. Status post approximately 20 months of therapy.  2) Xgeva 120 mg subcutaneously every 4 weeks for bone disease.   CHEMOTHERAPY INTENT: Palliative  CURRENT # OF CHEMOTHERAPY CYCLES: 21 CURRENT ANTIEMETICS: Compazine  CURRENT SMOKING STATUS: Never smoker  ORAL CHEMOTHERAPY AND CONSENT: Tarceva  CURRENT BISPHOSPHONATES USE: Xgeva  PAIN MANAGEMENT: None  NARCOTICS INDUCED CONSTIPATION: None  LIVING WILL AND CODE STATUS: Full code  INTERVAL HISTORY: Jacqueline Leonard 68 y.o. female returns to the clinic today for monthly followup visit accompanied by her husband. She is currently on treatment with Tarceva 150 mg by mouth daily fairly well with no significant adverse effects except for very mild skin rash. She denied having any significant diarrhea. She is traveling to [place] to visit her family next week and expected to spend 10 days there. She has no fever or chills, no nausea or vomiting. She has no weight loss or night sweats. The patient denied having any significant chest pain, shortness of breath, cough or hemoptysis. She is also tolerating the stereotactic radiotherapy to the enlarging left lower lobe lung nodule fairly well.  MEDICAL HISTORY: Past Medical History  Diagnosis Date  . Lung mass   . Hypertension   .  Hyperlipidemia   . Vitamin D deficiency   . Radiation 07/11/12-07/24/12    Palliative lung tx 30 gray in 10 fx  . Status post chemoradiation     Tarceva  . External hemorrhoids   . Allergy   . Anxiety   . Cancer, metastatic to bone 06/19/12    bx=L 5th ribmetastatic Adenocarcinoma with known lung mass    ALLERGIES:  is allergic to penicillins.  MEDICATIONS:  Current Outpatient Prescriptions  Medication Sig Dispense Refill  . amLODipine (NORVASC) 5 MG tablet Take 5 mg by mouth daily.      . cholecalciferol (VITAMIN D) 1000 UNITS tablet Take 1,000 Units by mouth daily.      Marland Kitchen erlotinib (TARCEVA) 150 MG tablet Take 1 tablet (150 mg total) by mouth daily. Take on an empty stomach 1 hour before meals or 2 hours after.  90 tablet  2  . Multiple Minerals-Vitamins (CALCIUM & VIT D3 BONE HEALTH PO) Take 750 mg by mouth 2 (two) times daily.      . polyethylene glycol (MIRALAX / GLYCOLAX) packet Take 17 g by mouth daily as needed.      . potassium chloride (K-DUR) 10 MEQ tablet Take 1 tablet by mouth daily.      . valsartan-hydrochlorothiazide (DIOVAN-HCT) 160-25 MG per tablet Take 1 tablet by mouth daily.       No current facility-administered medications for this visit.    SURGICAL HISTORY:  Past Surgical History  Procedure Laterality Date  . Soft tissue biopsy  06/19/12    L 5th rib=metastatic adenocarcinoma  . Ovarian cyst removal    . Appendectomy  REVIEW OF SYSTEMS:  Constitutional: negative Eyes: negative Ears, nose, mouth, throat, and face: negative Respiratory: negative Cardiovascular: negative Gastrointestinal: negative Genitourinary:negative Integument/breast: positive for dryness and rash Hematologic/lymphatic: negative Musculoskeletal:negative Neurological: negative Behavioral/Psych: negative Endocrine: negative Allergic/Immunologic: negative   PHYSICAL EXAMINATION: General appearance: alert, cooperative and no distress Head: Normocephalic, without obvious  abnormality, atraumatic Neck: no adenopathy, no JVD, supple, symmetrical, trachea midline and thyroid not enlarged, symmetric, no tenderness/mass/nodules Lymph nodes: Cervical, supraclavicular, and axillary nodes normal. Resp: clear to auscultation bilaterally Back: symmetric, no curvature. ROM normal. No CVA tenderness. Cardio: regular rate and rhythm, S1, S2 normal, no murmur, click, rub or gallop GI: soft, non-tender; bowel sounds normal; no masses,  no organomegaly Extremities: extremities normal, atraumatic, no cyanosis or edema Neurologic: Alert and oriented X 3, normal strength and tone. Normal symmetric reflexes. Normal coordination and gait  ECOG PERFORMANCE STATUS: 1 - Symptomatic but completely ambulatory  Blood pressure 140/49, pulse 68, temperature 98.3 F (36.8 C), temperature source Oral, resp. rate 19, height 5' 2"  (1.575 m), weight 161 lb 9.6 oz (73.301 kg).  LABORATORY DATA: Lab Results  Component Value Date   WBC 5.8 07/29/2014   HGB 10.4* 07/29/2014   HCT 31.8* 07/29/2014   MCV 90.5 07/29/2014   PLT 207 07/29/2014      Chemistry      Component Value Date/Time   NA 139 07/29/2014 1451   NA 136 09/23/2012 1525   K 3.8 07/29/2014 1451   K 4.0 09/23/2012 1525   CL 102 05/06/2013 1511   CL 101 09/23/2012 1525   CO2 27 07/29/2014 1451   CO2 26 09/23/2012 1525   BUN 31.1* 07/29/2014 1451   BUN 30* 09/23/2012 1525   CREATININE 0.9 07/29/2014 1451   CREATININE 0.84 09/23/2012 1525      Component Value Date/Time   CALCIUM 8.9 07/29/2014 1451   CALCIUM 8.9 09/23/2012 1525   ALKPHOS 42 07/29/2014 1451   ALKPHOS 52 09/23/2012 1525   AST 20 07/29/2014 1451   AST 24 09/23/2012 1525   ALT 20 07/29/2014 1451   ALT 31 09/23/2012 1525   BILITOT 0.35 07/29/2014 1451   BILITOT 0.8 09/23/2012 1525       RADIOGRAPHIC STUDIES:  ASSESSMENT AND PLAN: This is a very pleasant 68 years old Hispanic female with metastatic non-small cell lung cancer, adenocarcinoma currently on treatment with Tarceva  150 mg by mouth daily status post 20 months of treatment and tolerating it fairly well.  I recommended for the patient to continue her treatment with Tarceva with the same dose. For the metastatic bone disease, she will continue on Xgeva as previously scheduled.  For the iron deficiency, the patient will continue on over the counter ferrous sulfate. She would come back for followup visit in one month for reevaluation and management any adverse effect of her treatment She was advised to call immediately if she has any concerning symptoms in the interval.  The patient voices understanding of current disease status and treatment options and is in agreement with the current care plan.  All questions were answered. The patient knows to call the clinic with any problems, questions or concerns. We can certainly see the patient much sooner if necessary.  Disclaimer: This note was dictated with voice recognition software. Similar sounding words can inadvertently be transcribed and may not be corrected upon review.

## 2014-07-29 NOTE — Telephone Encounter (Signed)
gv adn printed appt sched and and avs for pt for SEpt

## 2014-07-30 ENCOUNTER — Ambulatory Visit: Payer: 59 | Admitting: Radiation Oncology

## 2014-07-31 ENCOUNTER — Encounter: Payer: Self-pay | Admitting: Radiation Oncology

## 2014-07-31 ENCOUNTER — Ambulatory Visit: Payer: 59 | Admitting: Radiation Oncology

## 2014-07-31 ENCOUNTER — Ambulatory Visit
Admission: RE | Admit: 2014-07-31 | Discharge: 2014-07-31 | Disposition: A | Discharge status: Home / Self Care | Payer: 59 | Source: Ambulatory Visit | Attending: Radiation Oncology | Admitting: Radiation Oncology

## 2014-07-31 DIAGNOSIS — C7802 Secondary malignant neoplasm of left lung: Secondary | ICD-10-CM

## 2014-07-31 DIAGNOSIS — Z510 Encounter for antineoplastic radiation therapy: Secondary | ICD-10-CM | POA: Diagnosis not present

## 2014-07-31 NOTE — Progress Notes (Addendum)
   Radiation Oncology         [phone]) [phone] ________________________________  Name: Lolita Faulds MRN: [id number]  Date: 07/31/2014  DOB: 07-Mar-1946  Stereotactic Body Radiotherapy Treatment Procedure Note   NARRATIVE: Ahmoni Edge was brought to the stereotactic radiation treatment machine and placed supine on the CT couch. The patient was set up for stereotactic body radiotherapy on the body fix pillow.   3D TREATMENT PLANNING AND DOSIMETRY: The patient's radiation plan was reviewed and approved prior to starting treatment. It showed 3-dimensional radiation distributions overlaid onto the planning CT. The [facility] for the target structures as well as the organs at risk were reviewed. The documentation of this is filed in the radiation oncology EMR.   SIMULATION VERIFICATION: The patient underwent CT imaging on the treatment unit. These were carefully aligned to document that the ablative radiation dose would cover the target volume and maximally spare the nearby organs at risk according to the planned distribution.   SPECIAL TREATMENT PROCEDURE: Rena Mausolf received high dose ablative stereotactic body radiotherapy to the planned target volume without unforeseen complications. Treatment was delivered uneventfully. The high doses associated with stereotactic body radiotherapy and the significant potential risks require careful treatment set up and patient monitoring constituting a special treatment procedure.   STEREOTACTIC TREATMENT MANAGEMENT: Following delivery, the patient was evaluated clinically. The patient tolerated treatment without significant acute effects, and was discharged to home in stable condition.   Fraction: 5  Dose:  60 Gy  PLAN: Follow-up in 1 month.   ________________________________  [name], MD, PhD

## 2014-08-03 ENCOUNTER — Ambulatory Visit: Payer: 59 | Admitting: Radiation Oncology

## 2014-08-04 ENCOUNTER — Ambulatory Visit: Payer: 59 | Admitting: Radiation Oncology

## 2014-08-05 ENCOUNTER — Ambulatory Visit: Payer: 59 | Admitting: Radiation Oncology

## 2014-08-06 ENCOUNTER — Ambulatory Visit: Payer: 59 | Admitting: Radiation Oncology

## 2014-08-13 NOTE — Progress Notes (Signed)
  Radiation Oncology         (336) [phone] ________________________________  Name: Jacqueline Leonard MRN: [id number]  Date: 07/31/2014  DOB: Sep 06, 1946  End of Treatment Note  Diagnosis:   Metastatic lung cancer     Indication for treatment:  palliaitve       Radiation treatment dates:   07/22/14 - 07/31/14  Site/dose:   The patient was treated to an enlarging pulmonary metastasis within the left lung. She received 60 Gy in 5 fractions using stereotactic body radiotherapy.  Narrative: The patient tolerated radiation treatment relatively well.   The patient did not exhibit acute toxicity during treatment.  Plan: The patient has completed radiation treatment. The patient will return to radiation oncology clinic for routine followup in one month. I advised the patient to call or return sooner if they have any questions or concerns related to their recovery or treatment. ________________________________  [name], M.D., Ph.D.

## 2014-08-26 ENCOUNTER — Other Ambulatory Visit (HOSPITAL_BASED_OUTPATIENT_CLINIC_OR_DEPARTMENT_OTHER): Payer: 59

## 2014-08-26 ENCOUNTER — Encounter: Payer: Self-pay | Admitting: Internal Medicine

## 2014-08-26 ENCOUNTER — Telehealth: Payer: Self-pay | Admitting: Internal Medicine

## 2014-08-26 ENCOUNTER — Ambulatory Visit (HOSPITAL_BASED_OUTPATIENT_CLINIC_OR_DEPARTMENT_OTHER): Payer: 59 | Admitting: Internal Medicine

## 2014-08-26 VITALS — BP 131/71 | HR 68 | Temp 98.4°F | Resp 20 | Wt 160.4 lb

## 2014-08-26 DIAGNOSIS — R21 Rash and other nonspecific skin eruption: Secondary | ICD-10-CM

## 2014-08-26 DIAGNOSIS — C799 Secondary malignant neoplasm of unspecified site: Secondary | ICD-10-CM

## 2014-08-26 DIAGNOSIS — C7952 Secondary malignant neoplasm of bone marrow: Secondary | ICD-10-CM

## 2014-08-26 DIAGNOSIS — C7951 Secondary malignant neoplasm of bone: Secondary | ICD-10-CM

## 2014-08-26 DIAGNOSIS — C3430 Malignant neoplasm of lower lobe, unspecified bronchus or lung: Secondary | ICD-10-CM

## 2014-08-26 DIAGNOSIS — D509 Iron deficiency anemia, unspecified: Secondary | ICD-10-CM

## 2014-08-26 LAB — CBC WITH DIFFERENTIAL/PLATELET
BASO%: 0.6 % (ref 0.0–2.0)
Basophils Absolute: 0 10*3/uL (ref 0.0–0.1)
EOS%: 2.5 % (ref 0.0–7.0)
Eosinophils Absolute: 0.2 10*3/uL (ref 0.0–0.5)
HEMATOCRIT: 32.6 % — AB (ref 34.8–46.6)
HGB: 10.5 g/dL — ABNORMAL LOW (ref 11.6–15.9)
LYMPH#: 0.7 10*3/uL — AB (ref 0.9–3.3)
LYMPH%: 10.8 % — ABNORMAL LOW (ref 14.0–49.7)
MCH: 29.1 pg (ref 25.1–34.0)
MCHC: 32.2 g/dL (ref 31.5–36.0)
MCV: 90.4 fL (ref 79.5–101.0)
MONO#: 0.6 10*3/uL (ref 0.1–0.9)
MONO%: 8.8 % (ref 0.0–14.0)
NEUT#: 5.2 10*3/uL (ref 1.5–6.5)
NEUT%: 77.3 % — AB (ref 38.4–76.8)
Platelets: 253 10*3/uL (ref 145–400)
RBC: 3.61 10*6/uL — AB (ref 3.70–5.45)
RDW: 13.1 % (ref 11.2–14.5)
WBC: 6.7 10*3/uL (ref 3.9–10.3)

## 2014-08-26 LAB — COMPREHENSIVE METABOLIC PANEL (CC13)
ALT: 12 U/L (ref 0–55)
AST: 18 U/L (ref 5–34)
Albumin: 3.2 g/dL — ABNORMAL LOW (ref 3.5–5.0)
Alkaline Phosphatase: 42 U/L (ref 40–150)
Anion Gap: 5 mEq/L (ref 3–11)
BUN: 34 mg/dL — ABNORMAL HIGH (ref 7.0–26.0)
CO2: 30 meq/L — AB (ref 22–29)
CREATININE: 1 mg/dL (ref 0.6–1.1)
Calcium: 9.5 mg/dL (ref 8.4–10.4)
Chloride: 104 mEq/L (ref 98–109)
Glucose: 109 mg/dl (ref 70–140)
POTASSIUM: 4.4 meq/L (ref 3.5–5.1)
Sodium: 139 mEq/L (ref 136–145)
TOTAL PROTEIN: 6.9 g/dL (ref 6.4–8.3)
Total Bilirubin: 0.43 mg/dL (ref 0.20–1.20)

## 2014-08-26 NOTE — Progress Notes (Signed)
[facility] [phone]   Fax:([phone]  OFFICE PROGRESS NOTE  [name], MD [place] [address]  DIAGNOSIS: Metastatic non-small cell lung cancer, adenocarcinoma with positive EGFR mutation in exon 19 and negative ALK gene translocation diagnosed in July 2013 with bone metastasis.   PRIOR THERAPY:  1) Status post radiotherapy to the left face rib metastasis under the care of Dr. Lisbeth Renshaw.  2) stereotactic radiotherapy to the enlarging left lower lobe lung nodule under the care of Dr. Lisbeth Renshaw.  CURRENT THERAPY:  1) treatment with Tarceva 150 mg by mouth daily, therapy beginning 07/20/2012. Status post approximately 21 months of therapy.  2) Xgeva 120 mg subcutaneously every 4 weeks for bone disease.   CHEMOTHERAPY INTENT: Palliative  CURRENT # OF CHEMOTHERAPY CYCLES: 22 CURRENT ANTIEMETICS: Compazine  CURRENT SMOKING STATUS: Never smoker  ORAL CHEMOTHERAPY AND CONSENT: Tarceva  CURRENT BISPHOSPHONATES USE: Xgeva  PAIN MANAGEMENT: None  NARCOTICS INDUCED CONSTIPATION: None  LIVING WILL AND CODE STATUS: Full code  INTERVAL HISTORY: Jacqueline Leonard 68 y.o. female returns to the clinic today for monthly followup visit accompanied by her husband. She is currently on treatment with Tarceva 150 mg by mouth daily fairly well with no significant adverse effects except for very mild skin rash. She denied having any significant diarrhea. She has no fever or chills, no nausea or vomiting. She has no weight loss or night sweats. The patient denied having any significant chest pain, shortness of breath, cough or hemoptysis. She tolerated the stereotactic radiotherapy to the enlarging left lower lobe lung nodule fairly well. She is here for evaluation and repeat blood work.  MEDICAL HISTORY: Past Medical History  Diagnosis Date  . Lung mass   . Hypertension   . Hyperlipidemia   . Vitamin D deficiency   . Radiation 07/11/12-07/24/12     Palliative lung tx 30 gray in 10 fx  . Status post chemoradiation     Tarceva  . External hemorrhoids   . Allergy   . Anxiety   . Cancer, metastatic to bone 06/19/12    bx=L 5th ribmetastatic Adenocarcinoma with known lung mass    ALLERGIES:  is allergic to penicillins.  MEDICATIONS:  Current Outpatient Prescriptions  Medication Sig Dispense Refill  . amLODipine (NORVASC) 5 MG tablet Take 5 mg by mouth daily.      . cholecalciferol (VITAMIN D) 1000 UNITS tablet Take 1,000 Units by mouth daily.      Marland Kitchen erlotinib (TARCEVA) 150 MG tablet Take 1 tablet (150 mg total) by mouth daily. Take on an empty stomach 1 hour before meals or 2 hours after.  90 tablet  2  . Multiple Minerals-Vitamins (CALCIUM & VIT D3 BONE HEALTH PO) Take 750 mg by mouth 2 (two) times daily.      . polyethylene glycol (MIRALAX / GLYCOLAX) packet Take 17 g by mouth daily as needed.      . potassium chloride (K-DUR) 10 MEQ tablet Take 1 tablet by mouth daily.      . valsartan-hydrochlorothiazide (DIOVAN-HCT) 160-25 MG per tablet Take 1 tablet by mouth daily.       No current facility-administered medications for this visit.    SURGICAL HISTORY:  Past Surgical History  Procedure Laterality Date  . Soft tissue biopsy  06/19/12    L 5th rib=metastatic adenocarcinoma  . Ovarian cyst removal    . Appendectomy      REVIEW OF SYSTEMS:  Constitutional: negative Eyes:  negative Ears, nose, mouth, throat, and face: negative Respiratory: negative Cardiovascular: negative Gastrointestinal: negative Genitourinary:negative Integument/breast: positive for dryness and rash Hematologic/lymphatic: negative Musculoskeletal:negative Neurological: negative Behavioral/Psych: negative Endocrine: negative Allergic/Immunologic: negative   PHYSICAL EXAMINATION: General appearance: alert, cooperative and no distress Head: Normocephalic, without obvious abnormality, atraumatic Neck: no adenopathy, no JVD, supple, symmetrical,  trachea midline and thyroid not enlarged, symmetric, no tenderness/mass/nodules Lymph nodes: Cervical, supraclavicular, and axillary nodes normal. Resp: clear to auscultation bilaterally Back: symmetric, no curvature. ROM normal. No CVA tenderness. Cardio: regular rate and rhythm, S1, S2 normal, no murmur, click, rub or gallop GI: soft, non-tender; bowel sounds normal; no masses,  no organomegaly Extremities: extremities normal, atraumatic, no cyanosis or edema Neurologic: Alert and oriented X 3, normal strength and tone. Normal symmetric reflexes. Normal coordination and gait  ECOG PERFORMANCE STATUS: 1 - Symptomatic but completely ambulatory  Blood pressure 131/71, pulse 68, temperature 98.4 F (36.9 C), temperature source Oral, resp. rate 20, weight 160 lb 6.4 oz (72.757 kg).  LABORATORY DATA: Lab Results  Component Value Date   WBC 6.7 08/26/2014   HGB 10.5* 08/26/2014   HCT 32.6* 08/26/2014   MCV 90.4 08/26/2014   PLT 253 08/26/2014      Chemistry      Component Value Date/Time   NA 139 08/26/2014 1458   NA 136 09/23/2012 1525   K 4.4 08/26/2014 1458   K 4.0 09/23/2012 1525   CL 102 05/06/2013 1511   CL 101 09/23/2012 1525   CO2 30* 08/26/2014 1458   CO2 26 09/23/2012 1525   BUN 34.0* 08/26/2014 1458   BUN 30* 09/23/2012 1525   CREATININE 1.0 08/26/2014 1458   CREATININE 0.84 09/23/2012 1525      Component Value Date/Time   CALCIUM 9.5 08/26/2014 1458   CALCIUM 8.9 09/23/2012 1525   ALKPHOS 42 08/26/2014 1458   ALKPHOS 52 09/23/2012 1525   AST 18 08/26/2014 1458   AST 24 09/23/2012 1525   ALT 12 08/26/2014 1458   ALT 31 09/23/2012 1525   BILITOT 0.43 08/26/2014 1458   BILITOT 0.8 09/23/2012 1525       RADIOGRAPHIC STUDIES:  ASSESSMENT AND PLAN: This is a very pleasant 68 years old Hispanic female with metastatic non-small cell lung cancer, adenocarcinoma currently on treatment with Tarceva 150 mg by mouth daily status post 21 months of treatment and tolerating it fairly  well. The patient has no significant complaints today. I recommended for the patient to continue her treatment with Tarceva with the same dose. For the metastatic bone disease, she will continue on Xgeva as previously scheduled.  For the iron deficiency, the patient will continue on over the counter ferrous sulfate. She would come back for followup visit in one month for reevaluation and management any adverse effect of her treatment She was advised to call immediately if she has any concerning symptoms in the interval.  The patient voices understanding of current disease status and treatment options and is in agreement with the current care plan.  All questions were answered. The patient knows to call the clinic with any problems, questions or concerns. We can certainly see the patient much sooner if necessary.  Disclaimer: This note was dictated with voice recognition software. Similar sounding words can inadvertently be transcribed and may not be corrected upon review.

## 2014-08-26 NOTE — Telephone Encounter (Signed)
gv pt appt schedule for nov.

## 2014-09-01 ENCOUNTER — Encounter: Payer: Self-pay | Admitting: Radiation Oncology

## 2014-09-03 ENCOUNTER — Ambulatory Visit
Admission: RE | Admit: 2014-09-03 | Discharge: 2014-09-03 | Disposition: A | Discharge status: Home / Self Care | Payer: 59 | Source: Ambulatory Visit | Attending: Radiation Oncology | Admitting: Radiation Oncology

## 2014-09-03 ENCOUNTER — Encounter: Payer: Self-pay | Admitting: Radiation Oncology

## 2014-09-03 VITALS — BP 128/58 | HR 70 | Temp 98.0°F | Resp 20 | Ht 62.0 in | Wt 162.7 lb

## 2014-09-03 DIAGNOSIS — C7802 Secondary malignant neoplasm of left lung: Secondary | ICD-10-CM

## 2014-09-03 HISTORY — DX: Personal history of irradiation: Z92.3

## 2014-09-03 NOTE — Progress Notes (Signed)
Follow up  S/p radiation left lung 07/22/14-07/31/14, has dry cough, no nausea, no difficulty swallowing foods/fluids, no sob, appetite good, energy level good, still takes Tarceva daily, rash on face from this 3:35 PM

## 2014-09-03 NOTE — Progress Notes (Signed)
  Radiation Oncology         [phone] ________________________________  Name: Jacqueline Leonard MRN: [id number]  Date: 09/03/2014  DOB: 1946/04/21  Follow-Up Visit Note  CC: [name], MD  Curt Bears, MD  Diagnosis:   Metastatic non-small cell lung cancer, adenocarcinoma  Interval Since Last Radiation:  The patient completed stereotactic body radiation treatment on 07/31/2014 to the left lung   Narrative:  The patient returns today for routine follow-up.  The patient states that she is doing well overall. No worsening shortness of breath. No esophagitis. No chest pain. She is continuing with systemic chemotherapy in the form of Tarceva with Dr. Julien Nordmann. She states that this is going well. She is due for a repeat CT scan in a couple of months.                              ALLERGIES:  is allergic to penicillins.  Meds: Current Outpatient Prescriptions  Medication Sig Dispense Refill  . amLODipine (NORVASC) 5 MG tablet Take 5 mg by mouth daily.      . cholecalciferol (VITAMIN D) 1000 UNITS tablet Take 1,000 Units by mouth daily.      Marland Kitchen erlotinib (TARCEVA) 150 MG tablet Take 1 tablet (150 mg total) by mouth daily. Take on an empty stomach 1 hour before meals or 2 hours after.  90 tablet  2  . Multiple Minerals-Vitamins (CALCIUM & VIT D3 BONE HEALTH PO) Take 750 mg by mouth 2 (two) times daily.      . potassium chloride (K-DUR) 10 MEQ tablet Take 1 tablet by mouth daily.      . valsartan-hydrochlorothiazide (DIOVAN-HCT) 160-25 MG per tablet Take 1 tablet by mouth daily.       No current facility-administered medications for this encounter.    Physical Findings: The patient is in no acute distress. Patient is alert and oriented.  height is 5\' 2"  (1.575 m) and weight is 162 lb 11.2 oz (73.8 kg). Her oral temperature is 98 F (36.7 C). Her blood pressure is 128/58 and her pulse is 70. Her respiration is 20 and oxygen saturation is 100%. .     Lab Findings: Lab Results    Component Value Date   WBC 6.7 08/26/2014   HGB 10.5* 08/26/2014   HCT 32.6* 08/26/2014   MCV 90.4 08/26/2014   PLT 253 08/26/2014     Radiographic Findings: No results found.  Impression:    The patient is clinically doing well. No difficulties or ongoing toxicity from her course of radiation treatment. She will continue systemic treatment with Dr. Julien Nordmann.  Plan:  Followup in 6 months.   [name], M.D., Ph.D.

## 2014-09-16 ENCOUNTER — Other Ambulatory Visit: Payer: Self-pay | Admitting: Medical Oncology

## 2014-09-18 ENCOUNTER — Other Ambulatory Visit: Payer: Self-pay | Admitting: *Deleted

## 2014-09-18 DIAGNOSIS — C799 Secondary malignant neoplasm of unspecified site: Secondary | ICD-10-CM

## 2014-09-18 MED ORDER — ERLOTINIB HCL 150 MG PO TABS: 150.0000 mg | ORAL_TABLET | Freq: Every day | ORAL | Status: DC

## 2014-09-30 ENCOUNTER — Other Ambulatory Visit (HOSPITAL_BASED_OUTPATIENT_CLINIC_OR_DEPARTMENT_OTHER): Payer: 59

## 2014-09-30 ENCOUNTER — Encounter: Payer: Self-pay | Admitting: Internal Medicine

## 2014-09-30 ENCOUNTER — Telehealth: Payer: Self-pay | Admitting: Internal Medicine

## 2014-09-30 ENCOUNTER — Ambulatory Visit (HOSPITAL_BASED_OUTPATIENT_CLINIC_OR_DEPARTMENT_OTHER): Payer: 59 | Admitting: Internal Medicine

## 2014-09-30 VITALS — BP 133/53 | HR 67 | Temp 98.2°F | Ht 62.0 in | Wt 160.6 lb

## 2014-09-30 DIAGNOSIS — D509 Iron deficiency anemia, unspecified: Secondary | ICD-10-CM

## 2014-09-30 DIAGNOSIS — C7951 Secondary malignant neoplasm of bone: Secondary | ICD-10-CM

## 2014-09-30 DIAGNOSIS — C799 Secondary malignant neoplasm of unspecified site: Secondary | ICD-10-CM

## 2014-09-30 DIAGNOSIS — C801 Malignant (primary) neoplasm, unspecified: Secondary | ICD-10-CM

## 2014-09-30 LAB — COMPREHENSIVE METABOLIC PANEL (CC13)
ALK PHOS: 48 U/L (ref 40–150)
ALT: 17 U/L (ref 0–55)
AST: 18 U/L (ref 5–34)
Albumin: 3.1 g/dL — ABNORMAL LOW (ref 3.5–5.0)
Anion Gap: 6 mEq/L (ref 3–11)
BUN: 29.8 mg/dL — AB (ref 7.0–26.0)
CO2: 28 mEq/L (ref 22–29)
CREATININE: 1 mg/dL (ref 0.6–1.1)
Calcium: 9.1 mg/dL (ref 8.4–10.4)
Chloride: 104 mEq/L (ref 98–109)
Glucose: 105 mg/dl (ref 70–140)
Potassium: 4 mEq/L (ref 3.5–5.1)
Sodium: 139 mEq/L (ref 136–145)
Total Bilirubin: 0.37 mg/dL (ref 0.20–1.20)
Total Protein: 6.5 g/dL (ref 6.4–8.3)

## 2014-09-30 LAB — CBC WITH DIFFERENTIAL/PLATELET
BASO%: 0.3 % (ref 0.0–2.0)
BASOS ABS: 0 10*3/uL (ref 0.0–0.1)
EOS%: 2.1 % (ref 0.0–7.0)
Eosinophils Absolute: 0.1 10*3/uL (ref 0.0–0.5)
HCT: 30.9 % — ABNORMAL LOW (ref 34.8–46.6)
HEMOGLOBIN: 10.1 g/dL — AB (ref 11.6–15.9)
LYMPH%: 13.7 % — ABNORMAL LOW (ref 14.0–49.7)
MCH: 29.4 pg (ref 25.1–34.0)
MCHC: 32.8 g/dL (ref 31.5–36.0)
MCV: 89.4 fL (ref 79.5–101.0)
MONO#: 0.5 10*3/uL (ref 0.1–0.9)
MONO%: 8.3 % (ref 0.0–14.0)
NEUT#: 4.9 10*3/uL (ref 1.5–6.5)
NEUT%: 75.6 % (ref 38.4–76.8)
Platelets: 257 10*3/uL (ref 145–400)
RBC: 3.45 10*6/uL — ABNORMAL LOW (ref 3.70–5.45)
RDW: 13.2 % (ref 11.2–14.5)
WBC: 6.5 10*3/uL (ref 3.9–10.3)
lymph#: 0.9 10*3/uL (ref 0.9–3.3)

## 2014-09-30 NOTE — Telephone Encounter (Signed)
gv adn printed appt sched and avs for pt for DEc.....gv pt barium

## 2014-09-30 NOTE — Progress Notes (Signed)
[place] [phone]   [phone]  OFFICE PROGRESS NOTE  [name], MD [place] [address]  DIAGNOSIS: Metastatic non-small cell lung cancer, adenocarcinoma with positive EGFR mutation in exon 19 and negative ALK gene translocation diagnosed in July 2013 with bone metastasis.   PRIOR THERAPY:  1) Status post radiotherapy to the left face rib metastasis under the care of Dr. Lisbeth Renshaw.  2) stereotactic radiotherapy to the enlarging left lower lobe lung nodule under the care of Dr. Lisbeth Renshaw.  CURRENT THERAPY:  1) treatment with Tarceva 150 mg by mouth daily, therapy beginning 07/20/2012. Status post approximately 22 months of therapy.  2) Xgeva 120 mg subcutaneously every 4 weeks for bone disease.   CHEMOTHERAPY INTENT: Palliative  CURRENT # OF CHEMOTHERAPY CYCLES: 23 CURRENT ANTIEMETICS: Compazine  CURRENT SMOKING STATUS: Never smoker  ORAL CHEMOTHERAPY AND CONSENT: Tarceva  CURRENT BISPHOSPHONATES USE: Xgeva  PAIN MANAGEMENT: None  NARCOTICS INDUCED CONSTIPATION: None  LIVING WILL AND CODE STATUS: Full code  INTERVAL HISTORY: Jacqueline Leonard 68 y.o. female returns to the clinic today for monthly followup visit. She is currently on treatment with Tarceva 150 mg by mouth daily fairly well with no significant adverse effects except for mild skin rash. She denied having any significant diarrhea. She has no fever or chills, no nausea or vomiting. She has no weight loss or night sweats. The patient denied having any significant chest pain, shortness of breath, cough or hemoptysis. She is here for evaluation and repeat blood work.  MEDICAL HISTORY: Past Medical History  Diagnosis Date  . Lung mass   . Hypertension   . Hyperlipidemia   . Vitamin D deficiency   . Radiation 07/11/12-07/24/12    Palliative lung tx 30 gray in 10 fx  . Status post chemoradiation     Tarceva  . External hemorrhoids   . Allergy   .  Anxiety   . Cancer, metastatic to bone 06/19/12    bx=L 5th ribmetastatic Adenocarcinoma with known lung mass  . S/P radiation therapy 07/22/14-07/31/14    left lung/60gy/55f    ALLERGIES:  is allergic to penicillins.  MEDICATIONS:  Current Outpatient Prescriptions  Medication Sig Dispense Refill  . amLODipine (NORVASC) 5 MG tablet Take 5 mg by mouth daily.    . cholecalciferol (VITAMIN D) 1000 UNITS tablet Take 1,000 Units by mouth daily.    .Marland Kitchenerlotinib (TARCEVA) 150 MG tablet Take 1 tablet (150 mg total) by mouth daily. Take on an empty stomach 1 hour before meals or 2 hours after. 90 tablet 2  . Multiple Minerals-Vitamins (CALCIUM & VIT D3 BONE HEALTH PO) Take 750 mg by mouth 2 (two) times daily.    . potassium chloride (K-DUR) 10 MEQ tablet Take 1 tablet by mouth daily.    . valsartan-hydrochlorothiazide (DIOVAN-HCT) 160-25 MG per tablet Take 1 tablet by mouth daily.     No current facility-administered medications for this visit.    SURGICAL HISTORY:  Past Surgical History  Procedure Laterality Date  . Soft tissue biopsy  06/19/12    L 5th rib=metastatic adenocarcinoma  . Ovarian cyst removal    . Appendectomy      REVIEW OF SYSTEMS:  Constitutional: negative Eyes: negative Ears, nose, mouth, throat, and face: negative Respiratory: negative Cardiovascular: negative Gastrointestinal: negative Genitourinary:negative Integument/breast: positive for dryness and rash Hematologic/lymphatic: negative Musculoskeletal:negative Neurological: negative Behavioral/Psych: negative Endocrine: negative Allergic/Immunologic: negative   PHYSICAL EXAMINATION: General appearance: alert, cooperative and  no distress Head: Normocephalic, without obvious abnormality, atraumatic Neck: no adenopathy, no JVD, supple, symmetrical, trachea midline and thyroid not enlarged, symmetric, no tenderness/mass/nodules Lymph nodes: Cervical, supraclavicular, and axillary nodes normal. Resp: clear to  auscultation bilaterally Back: symmetric, no curvature. ROM normal. No CVA tenderness. Cardio: regular rate and rhythm, S1, S2 normal, no murmur, click, rub or gallop GI: soft, non-tender; bowel sounds normal; no masses,  no organomegaly Extremities: extremities normal, atraumatic, no cyanosis or edema Neurologic: Alert and oriented X 3, normal strength and tone. Normal symmetric reflexes. Normal coordination and gait  ECOG PERFORMANCE STATUS: 1 - Symptomatic but completely ambulatory  There were no vitals taken for this visit.  LABORATORY DATA: Lab Results  Component Value Date   WBC 6.5 09/30/2014   HGB 10.1* 09/30/2014   HCT 30.9* 09/30/2014   MCV 89.4 09/30/2014   PLT 257 09/30/2014      Chemistry      Component Value Date/Time   NA 139 08/26/2014 1458   NA 136 09/23/2012 1525   K 4.4 08/26/2014 1458   K 4.0 09/23/2012 1525   CL 102 05/06/2013 1511   CL 101 09/23/2012 1525   CO2 30* 08/26/2014 1458   CO2 26 09/23/2012 1525   BUN 34.0* 08/26/2014 1458   BUN 30* 09/23/2012 1525   CREATININE 1.0 08/26/2014 1458   CREATININE 0.84 09/23/2012 1525      Component Value Date/Time   CALCIUM 9.5 08/26/2014 1458   CALCIUM 8.9 09/23/2012 1525   ALKPHOS 42 08/26/2014 1458   ALKPHOS 52 09/23/2012 1525   AST 18 08/26/2014 1458   AST 24 09/23/2012 1525   ALT 12 08/26/2014 1458   ALT 31 09/23/2012 1525   BILITOT 0.43 08/26/2014 1458   BILITOT 0.8 09/23/2012 1525       RADIOGRAPHIC STUDIES:  ASSESSMENT AND PLAN: This is a very pleasant 68 years old Hispanic female with metastatic non-small cell lung cancer, adenocarcinoma currently on treatment with Tarceva 150 mg by mouth daily status post 22 months of treatment and tolerating it fairly well. The patient has no significant complaints today. I recommended for the patient to continue her treatment with Tarceva with the same dose. For the metastatic bone disease, she will continue on Xgeva as previously scheduled.  For the  iron deficiency, the patient will continue on over the counter ferrous sulfate. She would come back for followup visit in one month After repeating CT scan of the chest, abdomen and pelvis for restaging of her disease. She was advised to call immediately if she has any concerning symptoms in the interval.  The patient voices understanding of current disease status and treatment options and is in agreement with the current care plan.  All questions were answered. The patient knows to call the clinic with any problems, questions or concerns. We can certainly see the patient much sooner if necessary.  Disclaimer: This note was dictated with voice recognition software. Similar sounding words can inadvertently be transcribed and may not be corrected upon review.

## 2014-10-27 ENCOUNTER — Ambulatory Visit (HOSPITAL_COMMUNITY)
Admission: RE | Admit: 2014-10-27 | Discharge: 2014-10-27 | Disposition: A | Discharge status: Home / Self Care | Payer: 59 | Source: Ambulatory Visit | Attending: Internal Medicine | Admitting: Internal Medicine

## 2014-10-27 ENCOUNTER — Other Ambulatory Visit (HOSPITAL_BASED_OUTPATIENT_CLINIC_OR_DEPARTMENT_OTHER): Payer: 59

## 2014-10-27 DIAGNOSIS — R911 Solitary pulmonary nodule: Secondary | ICD-10-CM | POA: Insufficient documentation

## 2014-10-27 DIAGNOSIS — C799 Secondary malignant neoplasm of unspecified site: Secondary | ICD-10-CM

## 2014-10-27 DIAGNOSIS — R918 Other nonspecific abnormal finding of lung field: Secondary | ICD-10-CM | POA: Diagnosis not present

## 2014-10-27 DIAGNOSIS — C3490 Malignant neoplasm of unspecified part of unspecified bronchus or lung: Secondary | ICD-10-CM | POA: Diagnosis not present

## 2014-10-27 DIAGNOSIS — C7800 Secondary malignant neoplasm of unspecified lung: Secondary | ICD-10-CM | POA: Diagnosis not present

## 2014-10-27 DIAGNOSIS — C801 Malignant (primary) neoplasm, unspecified: Secondary | ICD-10-CM

## 2014-10-27 LAB — CBC WITH DIFFERENTIAL/PLATELET
BASO%: 0.3 % (ref 0.0–2.0)
Basophils Absolute: 0 10*3/uL (ref 0.0–0.1)
EOS%: 2.7 % (ref 0.0–7.0)
Eosinophils Absolute: 0.2 10*3/uL (ref 0.0–0.5)
HCT: 33.8 % — ABNORMAL LOW (ref 34.8–46.6)
HGB: 10.8 g/dL — ABNORMAL LOW (ref 11.6–15.9)
LYMPH%: 17 % (ref 14.0–49.7)
MCH: 28.9 pg (ref 25.1–34.0)
MCHC: 32 g/dL (ref 31.5–36.0)
MCV: 90.4 fL (ref 79.5–101.0)
MONO#: 0.6 10*3/uL (ref 0.1–0.9)
MONO%: 8.8 % (ref 0.0–14.0)
NEUT#: 4.7 10*3/uL (ref 1.5–6.5)
NEUT%: 71.2 % (ref 38.4–76.8)
Platelets: 219 10*3/uL (ref 145–400)
RBC: 3.74 10*6/uL (ref 3.70–5.45)
RDW: 13.6 % (ref 11.2–14.5)
WBC: 6.6 10*3/uL (ref 3.9–10.3)
lymph#: 1.1 10*3/uL (ref 0.9–3.3)

## 2014-10-27 LAB — COMPREHENSIVE METABOLIC PANEL (CC13)
ALBUMIN: 3.6 g/dL (ref 3.5–5.0)
ALT: 21 U/L (ref 0–55)
AST: 23 U/L (ref 5–34)
Alkaline Phosphatase: 55 U/L (ref 40–150)
Anion Gap: 8 mEq/L (ref 3–11)
BILIRUBIN TOTAL: 0.44 mg/dL (ref 0.20–1.20)
BUN: 31 mg/dL — ABNORMAL HIGH (ref 7.0–26.0)
CO2: 29 mEq/L (ref 22–29)
Calcium: 9.5 mg/dL (ref 8.4–10.4)
Chloride: 102 mEq/L (ref 98–109)
Creatinine: 0.8 mg/dL (ref 0.6–1.1)
GLUCOSE: 88 mg/dL (ref 70–140)
POTASSIUM: 3.9 meq/L (ref 3.5–5.1)
Sodium: 139 mEq/L (ref 136–145)
Total Protein: 7.2 g/dL (ref 6.4–8.3)

## 2014-10-27 MED ORDER — IOHEXOL 300 MG/ML  SOLN
100.0000 mL | Freq: Once | INTRAMUSCULAR | Status: AC | PRN
  Administered 2014-10-27: 100 mL via INTRAVENOUS

## 2014-11-03 ENCOUNTER — Ambulatory Visit (HOSPITAL_BASED_OUTPATIENT_CLINIC_OR_DEPARTMENT_OTHER): Payer: 59 | Admitting: Internal Medicine

## 2014-11-03 ENCOUNTER — Encounter: Payer: Self-pay | Admitting: Internal Medicine

## 2014-11-03 ENCOUNTER — Telehealth: Payer: Self-pay | Admitting: Internal Medicine

## 2014-11-03 VITALS — BP 139/52 | HR 65 | Temp 98.0°F | Resp 18 | Ht 62.0 in | Wt 165.5 lb

## 2014-11-03 DIAGNOSIS — D509 Iron deficiency anemia, unspecified: Secondary | ICD-10-CM

## 2014-11-03 DIAGNOSIS — C799 Secondary malignant neoplasm of unspecified site: Secondary | ICD-10-CM

## 2014-11-03 DIAGNOSIS — C3432 Malignant neoplasm of lower lobe, left bronchus or lung: Secondary | ICD-10-CM

## 2014-11-03 DIAGNOSIS — R21 Rash and other nonspecific skin eruption: Secondary | ICD-10-CM

## 2014-11-03 DIAGNOSIS — C7951 Secondary malignant neoplasm of bone: Secondary | ICD-10-CM

## 2014-11-03 MED ORDER — DOXYCYCLINE HYCLATE 100 MG PO TABS: 100.0000 mg | ORAL_TABLET | Freq: Two times a day (BID) | ORAL | Status: DC

## 2014-11-03 NOTE — Telephone Encounter (Signed)
gv and printed appt sched and avs for pt for Jan 2016 °

## 2014-11-03 NOTE — Progress Notes (Signed)
[place] [phone]   [phone]  OFFICE PROGRESS NOTE  [name], MD [place] [address]  DIAGNOSIS: Metastatic non-small cell lung cancer, adenocarcinoma with positive EGFR mutation in exon 19 and negative ALK gene translocation diagnosed in July 2013 with bone metastasis.   PRIOR THERAPY:  1) Status post radiotherapy to the left face rib metastasis under the care of Dr. Lisbeth Renshaw.  2) stereotactic radiotherapy to the enlarging left lower lobe lung nodule under the care of Dr. Lisbeth Renshaw.  CURRENT THERAPY:  1) treatment with Tarceva 150 mg by mouth daily, therapy beginning 07/20/2012. Status post approximately 23 months of therapy.  2) Xgeva 120 mg subcutaneously every 4 weeks for bone disease.   CHEMOTHERAPY INTENT: Palliative  CURRENT # OF CHEMOTHERAPY CYCLES: 24 CURRENT ANTIEMETICS: Compazine  CURRENT SMOKING STATUS: Never smoker  ORAL CHEMOTHERAPY AND CONSENT: Tarceva  CURRENT BISPHOSPHONATES USE: Xgeva  PAIN MANAGEMENT: None  NARCOTICS INDUCED CONSTIPATION: None  LIVING WILL AND CODE STATUS: Full code  INTERVAL HISTORY: Jacqueline Leonard 68 y.o. female returns to the clinic today for monthly followup visit accompanied by her husband. She is currently on treatment with Tarceva 150 mg by mouth daily fairly well with no significant adverse effects except for flare of skin rash especially on the face started few days ago. She denied having any significant diarrhea. She has no fever or chills, no nausea or vomiting. She has no weight loss or night sweats. The patient denied having any significant chest pain, shortness of breath, cough or hemoptysis. She had repeat CT scan of the chest, abdomen and pelvis performed recently and she is here for evaluation and discussion of her scan results.  MEDICAL HISTORY: Past Medical History  Diagnosis Date  . Lung mass   . Hypertension   . Hyperlipidemia   . Vitamin D  deficiency   . Radiation 07/11/12-07/24/12    Palliative lung tx 30 gray in 10 fx  . Status post chemoradiation     Tarceva  . External hemorrhoids   . Allergy   . Anxiety   . Cancer, metastatic to bone 06/19/12    bx=L 5th ribmetastatic Adenocarcinoma with known lung mass  . S/P radiation therapy 07/22/14-07/31/14    left lung/60gy/53f    ALLERGIES:  is allergic to penicillins.  MEDICATIONS:  Current Outpatient Prescriptions  Medication Sig Dispense Refill  . amLODipine (NORVASC) 5 MG tablet Take 5 mg by mouth daily.    . cholecalciferol (VITAMIN D) 1000 UNITS tablet Take 1,000 Units by mouth daily.    .Marland Kitchenerlotinib (TARCEVA) 150 MG tablet Take 1 tablet (150 mg total) by mouth daily. Take on an empty stomach 1 hour before meals or 2 hours after. 90 tablet 2  . Multiple Minerals-Vitamins (CALCIUM & VIT D3 BONE HEALTH PO) Take 750 mg by mouth 2 (two) times daily.    . potassium chloride (K-DUR) 10 MEQ tablet Take 1 tablet by mouth daily.    . valsartan-hydrochlorothiazide (DIOVAN-HCT) 160-25 MG per tablet Take 1 tablet by mouth daily.     No current facility-administered medications for this visit.    SURGICAL HISTORY:  Past Surgical History  Procedure Laterality Date  . Soft tissue biopsy  06/19/12    L 5th rib=metastatic adenocarcinoma  . Ovarian cyst removal    . Appendectomy      REVIEW OF SYSTEMS:  Constitutional: negative Eyes: negative Ears, nose, mouth, throat, and face: negative Respiratory: negative Cardiovascular: negative  Gastrointestinal: negative Genitourinary:negative Integument/breast: positive for dryness and rash Hematologic/lymphatic: negative Musculoskeletal:negative Neurological: negative Behavioral/Psych: negative Endocrine: negative Allergic/Immunologic: negative   PHYSICAL EXAMINATION: General appearance: alert, cooperative and no distress Head: Normocephalic, without obvious abnormality, atraumatic Neck: no adenopathy, no JVD, supple, symmetrical,  trachea midline and thyroid not enlarged, symmetric, no tenderness/mass/nodules Lymph nodes: Cervical, supraclavicular, and axillary nodes normal. Resp: clear to auscultation bilaterally Back: symmetric, no curvature. ROM normal. No CVA tenderness. Cardio: regular rate and rhythm, S1, S2 normal, no murmur, click, rub or gallop GI: soft, non-tender; bowel sounds normal; no masses,  no organomegaly Extremities: extremities normal, atraumatic, no cyanosis or edema Neurologic: Alert and oriented X 3, normal strength and tone. Normal symmetric reflexes. Normal coordination and gait  ECOG PERFORMANCE STATUS: 1 - Symptomatic but completely ambulatory  Blood pressure 139/52, pulse 65, temperature 98 F (36.7 C), temperature source Oral, resp. rate 18, height _0  (1.575 m), weight 165 lb 8 oz (75.07 kg), SpO2 100 %.  LABORATORY DATA: Lab Results  Component Value Date   WBC 6.6 10/27/2014   HGB 10.8* 10/27/2014   HCT 33.8* 10/27/2014   MCV 90.4 10/27/2014   PLT 219 10/27/2014      Chemistry      Component Value Date/Time   NA 139 10/27/2014 1529   NA 136 09/23/2012 1525   K 3.9 10/27/2014 1529   K 4.0 09/23/2012 1525   CL 102 05/06/2013 1511   CL 101 09/23/2012 1525   CO2 29 10/27/2014 1529   CO2 26 09/23/2012 1525   BUN 31.0* 10/27/2014 1529   BUN 30* 09/23/2012 1525   CREATININE 0.8 10/27/2014 1529   CREATININE 0.84 09/23/2012 1525      Component Value Date/Time   CALCIUM 9.5 10/27/2014 1529   CALCIUM 8.9 09/23/2012 1525   ALKPHOS 55 10/27/2014 1529   ALKPHOS 52 09/23/2012 1525   AST 23 10/27/2014 1529   AST 24 09/23/2012 1525   ALT 21 10/27/2014 1529   ALT 31 09/23/2012 1525   BILITOT 0.44 10/27/2014 1529   BILITOT 0.8 09/23/2012 1525       RADIOGRAPHIC STUDIES: Ct Chest W Contrast  10/27/2014   CLINICAL DATA:  Metastatic lung cancer.  EXAM: CT CHEST, ABDOMEN, AND PELVIS WITH CONTRAST  TECHNIQUE: Multidetector CT imaging of the chest, abdomen and pelvis was  performed following the standard protocol during bolus administration of intravenous contrast.  CONTRAST:  141m OMNIPAQUE IOHEXOL 300 MG/ML  SOLN  COMPARISON:  06/29/2014  FINDINGS: CT CHEST FINDINGS  Chest wall: No breast masses, supraclavicular or axillary lymphadenopathy. The thyroid gland is grossly normal. The bony thorax demonstrates stable osteoporosis and hemangiomas. Treated bone metastasis are noted involving the left fourth rib and T12 vertebral body. No new bone lesions.  Mediastinum: The heart is normal in size. No pericardial effusion. No mediastinal or hilar mass or adenopathy. The aorta is normal in caliber. No dissection. The esophagus is grossly normal.  Lungs: Progressive radiation changes noted in the left lower lobe obscuring the left lower lobe pulmonary lesion. Multiple small pulmonary nodules bilaterally are unchanged. No pleural effusion.  CT ABDOMEN AND PELVIS FINDINGS  No focal hepatic lesions or intrahepatic biliary dilatation. The gallbladder is normal. No common bile duct dilatation.  The pancreas and spleen are normal. The adrenal glands are stable. Stable extra renal pelves and left renal cyst.  The stomach, duodenum, small bowel and colon are unremarkable. No inflammatory changes, mass lesions or obstructive findings.  No mesenteric or retroperitoneal mass or  adenopathy. The aorta and branch vessels are patent. The major venous structures are patent.  Stable calcified uterine fibroid. The bladder is normal. The ovaries are normal. No pelvic mass or adenopathy. No free pelvic fluid collections. No inguinal mass or adenopathy.  The bony structures are intact. Stable bilateral pars defects at L5 with grade 1 spondylolisthesis. No pelvic bone lesions.  IMPRESSION: 1. Progressive radiation changes in the left lower lobe obscuring the pulmonary nodule. 2. No mediastinal or hilar mass or adenopathy. 3. Stable bilateral pulmonary nodules. 4. No findings for metastatic disease involving the  abdomen/pelvis. 5. Treated osseous metastatic lesions without new bone findings.   Electronically Signed   By: Kalman Jewels M.D.   On: 10/27/2014 18:01   Ct Abdomen Pelvis W Contrast  10/27/2014   CLINICAL DATA:  Metastatic lung cancer.  EXAM: CT CHEST, ABDOMEN, AND PELVIS WITH CONTRAST  TECHNIQUE: Multidetector CT imaging of the chest, abdomen and pelvis was performed following the standard protocol during bolus administration of intravenous contrast.  CONTRAST:  152m OMNIPAQUE IOHEXOL 300 MG/ML  SOLN  COMPARISON:  06/29/2014  FINDINGS: CT CHEST FINDINGS  Chest wall: No breast masses, supraclavicular or axillary lymphadenopathy. The thyroid gland is grossly normal. The bony thorax demonstrates stable osteoporosis and hemangiomas. Treated bone metastasis are noted involving the left fourth rib and T12 vertebral body. No new bone lesions.  Mediastinum: The heart is normal in size. No pericardial effusion. No mediastinal or hilar mass or adenopathy. The aorta is normal in caliber. No dissection. The esophagus is grossly normal.  Lungs: Progressive radiation changes noted in the left lower lobe obscuring the left lower lobe pulmonary lesion. Multiple small pulmonary nodules bilaterally are unchanged. No pleural effusion.  CT ABDOMEN AND PELVIS FINDINGS  No focal hepatic lesions or intrahepatic biliary dilatation. The gallbladder is normal. No common bile duct dilatation.  The pancreas and spleen are normal. The adrenal glands are stable. Stable extra renal pelves and left renal cyst.  The stomach, duodenum, small bowel and colon are unremarkable. No inflammatory changes, mass lesions or obstructive findings.  No mesenteric or retroperitoneal mass or adenopathy. The aorta and branch vessels are patent. The major venous structures are patent.  Stable calcified uterine fibroid. The bladder is normal. The ovaries are normal. No pelvic mass or adenopathy. No free pelvic fluid collections. No inguinal mass or  adenopathy.  The bony structures are intact. Stable bilateral pars defects at L5 with grade 1 spondylolisthesis. No pelvic bone lesions.  IMPRESSION: 1. Progressive radiation changes in the left lower lobe obscuring the pulmonary nodule. 2. No mediastinal or hilar mass or adenopathy. 3. Stable bilateral pulmonary nodules. 4. No findings for metastatic disease involving the abdomen/pelvis. 5. Treated osseous metastatic lesions without new bone findings.   Electronically Signed   By: MKalman JewelsM.D.   On: 10/27/2014 18:01   ASSESSMENT AND PLAN: This is a very pleasant 68years old Hispanic female with metastatic non-small cell lung cancer, adenocarcinoma currently on treatment with Tarceva 150 mg by mouth daily status post 23 months of treatment and tolerating it fairly well. The patient has no significant complaints today except for the flare of the skin rash. Her recent CT scan of the chest, abdomen and pelvis showed no evidence for disease progression. I discussed the scan results with the patient and her husband. I recommended for the patient to continue her treatment with Tarceva with the same dose. For the skin rash, I would start her on doxycycline 100 mg  by mouth twice a day for 10 days. For the metastatic bone disease, she will continue on Xgeva as previously scheduled.  For the iron deficiency, the patient will continue on over the counter ferrous sulfate. She would come back for followup visit in one month after repeating CBC and comprehensive metabolic panel. She was advised to call immediately if she has any concerning symptoms in the interval.  The patient voices understanding of current disease status and treatment options and is in agreement with the current care plan.  All questions were answered. The patient knows to call the clinic with any problems, questions or concerns. We can certainly see the patient much sooner if necessary.  Disclaimer: This note was dictated with voice  recognition software. Similar sounding words can inadvertently be transcribed and may not be corrected upon review.

## 2014-11-06 ENCOUNTER — Other Ambulatory Visit: Payer: Self-pay | Admitting: Medical Oncology

## 2014-11-06 DIAGNOSIS — C799 Secondary malignant neoplasm of unspecified site: Secondary | ICD-10-CM

## 2014-11-06 MED ORDER — DOXYCYCLINE HYCLATE 100 MG PO TABS: 100.0000 mg | ORAL_TABLET | Freq: Two times a day (BID) | ORAL | Status: DC

## 2014-12-01 ENCOUNTER — Encounter: Payer: Self-pay | Admitting: Physician Assistant

## 2014-12-01 ENCOUNTER — Ambulatory Visit (HOSPITAL_BASED_OUTPATIENT_CLINIC_OR_DEPARTMENT_OTHER): Payer: 59 | Admitting: Physician Assistant

## 2014-12-01 ENCOUNTER — Telehealth: Payer: Self-pay | Admitting: Internal Medicine

## 2014-12-01 ENCOUNTER — Other Ambulatory Visit (HOSPITAL_BASED_OUTPATIENT_CLINIC_OR_DEPARTMENT_OTHER): Payer: 59

## 2014-12-01 VITALS — BP 133/38 | HR 64 | Temp 97.9°F | Resp 18 | Ht 62.0 in | Wt 166.6 lb

## 2014-12-01 DIAGNOSIS — R21 Rash and other nonspecific skin eruption: Secondary | ICD-10-CM

## 2014-12-01 DIAGNOSIS — C799 Secondary malignant neoplasm of unspecified site: Secondary | ICD-10-CM

## 2014-12-01 DIAGNOSIS — D509 Iron deficiency anemia, unspecified: Secondary | ICD-10-CM

## 2014-12-01 DIAGNOSIS — C7951 Secondary malignant neoplasm of bone: Secondary | ICD-10-CM

## 2014-12-01 DIAGNOSIS — C3432 Malignant neoplasm of lower lobe, left bronchus or lung: Secondary | ICD-10-CM

## 2014-12-01 LAB — CBC WITH DIFFERENTIAL/PLATELET
BASO%: 0.9 % (ref 0.0–2.0)
Basophils Absolute: 0.1 10*3/uL (ref 0.0–0.1)
EOS%: 2.7 % (ref 0.0–7.0)
Eosinophils Absolute: 0.2 10*3/uL (ref 0.0–0.5)
HCT: 32.9 % — ABNORMAL LOW (ref 34.8–46.6)
HGB: 10.7 g/dL — ABNORMAL LOW (ref 11.6–15.9)
LYMPH%: 15.1 % (ref 14.0–49.7)
MCH: 29 pg (ref 25.1–34.0)
MCHC: 32.6 g/dL (ref 31.5–36.0)
MCV: 88.8 fL (ref 79.5–101.0)
MONO#: 0.5 10*3/uL (ref 0.1–0.9)
MONO%: 7.3 % (ref 0.0–14.0)
NEUT#: 5 10*3/uL (ref 1.5–6.5)
NEUT%: 74 % (ref 38.4–76.8)
Platelets: 239 10*3/uL (ref 145–400)
RBC: 3.7 10*6/uL (ref 3.70–5.45)
RDW: 14 % (ref 11.2–14.5)
WBC: 6.8 10*3/uL (ref 3.9–10.3)
lymph#: 1 10*3/uL (ref 0.9–3.3)

## 2014-12-01 LAB — COMPREHENSIVE METABOLIC PANEL (CC13)
ALBUMIN: 3.4 g/dL — AB (ref 3.5–5.0)
ALT: 18 U/L (ref 0–55)
AST: 22 U/L (ref 5–34)
Alkaline Phosphatase: 58 U/L (ref 40–150)
Anion Gap: 8 mEq/L (ref 3–11)
BILIRUBIN TOTAL: 0.51 mg/dL (ref 0.20–1.20)
BUN: 28.3 mg/dL — ABNORMAL HIGH (ref 7.0–26.0)
CALCIUM: 9.4 mg/dL (ref 8.4–10.4)
CO2: 31 meq/L — AB (ref 22–29)
Chloride: 101 mEq/L (ref 98–109)
Creatinine: 1 mg/dL (ref 0.6–1.1)
EGFR: 58 mL/min/{1.73_m2} — AB (ref >90)
GLUCOSE: 97 mg/dL (ref 70–140)
Potassium: 3.8 mEq/L (ref 3.5–5.1)
SODIUM: 140 meq/L (ref 136–145)
TOTAL PROTEIN: 7 g/dL (ref 6.4–8.3)

## 2014-12-01 NOTE — Progress Notes (Addendum)
[place] [phone]   [phone]  OFFICE PROGRESS NOTE  [name], MD [place] [address]  DIAGNOSIS: Metastatic non-small cell lung cancer, adenocarcinoma with positive EGFR mutation in exon 19 and negative ALK gene translocation diagnosed in July 2013 with bone metastasis.   PRIOR THERAPY:  1) Status post radiotherapy to the left face rib metastasis under the care of Dr. Lisbeth Renshaw.  2) stereotactic radiotherapy to the enlarging left lower lobe lung nodule under the care of Dr. Lisbeth Renshaw.  CURRENT THERAPY:  1) treatment with Tarceva 150 mg by mouth daily, therapy beginning 07/20/2012. Status post approximately 24 months of therapy.  2) Xgeva 120 mg subcutaneously every 4 weeks for bone disease.   CHEMOTHERAPY INTENT: Palliative  CURRENT # OF CHEMOTHERAPY CYCLES: 25 CURRENT ANTIEMETICS: Compazine  CURRENT SMOKING STATUS: Never smoker  ORAL CHEMOTHERAPY AND CONSENT: Tarceva  CURRENT BISPHOSPHONATES USE: Xgeva  PAIN MANAGEMENT: None  NARCOTICS INDUCED CONSTIPATION: None  LIVING WILL AND CODE STATUS: Full code  INTERVAL HISTORY: Jacqueline Leonard 69 y.o. female returns to the clinic today for monthly followup visit accompanied by her husband. She is currently on treatment with Tarceva 150 mg by mouth daily fairly well with no significant adverse effects. She has occasional skin rash flares but currently only grade 1. When last seen by Dr. Julien Nordmann he put her on a course of doxycycline. She had been eating spicy foods and discontinued eating the spicy foods in her rash cleared up. She had only taken 3 of the docs E site clean antibiotic tablets. She is questioning whether she needs to continue the course of antibiotics. She voiced no other specific complaints. except for flare of skin rash especially on the face started few days ago. She denied having any significant diarrhea. She has no fever or chills, no nausea or vomiting.  She has no weight loss or night sweats. The patient denied having any significant chest pain, shortness of breath, cough or hemoptysis.   MEDICAL HISTORY: Past Medical History  Diagnosis Date  . Lung mass   . Hypertension   . Hyperlipidemia   . Vitamin D deficiency   . Radiation 07/11/12-07/24/12    Palliative lung tx 30 gray in 10 fx  . Status post chemoradiation     Tarceva  . External hemorrhoids   . Allergy   . Anxiety   . Cancer, metastatic to bone 06/19/12    bx=L 5th ribmetastatic Adenocarcinoma with known lung mass  . S/P radiation therapy 07/22/14-07/31/14    left lung/60gy/61f    ALLERGIES:  is allergic to penicillins.  MEDICATIONS:  Current Outpatient Prescriptions  Medication Sig Dispense Refill  . amLODipine (NORVASC) 5 MG tablet Take 5 mg by mouth daily.    . cholecalciferol (VITAMIN D) 1000 UNITS tablet Take 1,000 Units by mouth daily.    .Marland Kitchendoxycycline (VIBRA-TABS) 100 MG tablet Take 1 tablet (100 mg total) by mouth 2 (two) times daily. 20 tablet 0  . erlotinib (TARCEVA) 150 MG tablet Take 1 tablet (150 mg total) by mouth daily. Take on an empty stomach 1 hour before meals or 2 hours after. 90 tablet 2  . Multiple Minerals-Vitamins (CALCIUM & VIT D3 BONE HEALTH PO) Take 750 mg by mouth 2 (two) times daily.    . potassium chloride (K-DUR) 10 MEQ tablet Take 1 tablet by mouth daily.    . valsartan-hydrochlorothiazide (DIOVAN-HCT) 160-25 MG per tablet Take 1 tablet by mouth daily.  No current facility-administered medications for this visit.    SURGICAL HISTORY:  Past Surgical History  Procedure Laterality Date  . Soft tissue biopsy  06/19/12    L 5th rib=metastatic adenocarcinoma  . Ovarian cyst removal    . Appendectomy      REVIEW OF SYSTEMS:  Constitutional: negative Eyes: negative Ears, nose, mouth, throat, and face: negative Respiratory: negative Cardiovascular: negative Gastrointestinal: negative Genitourinary:negative Integument/breast: positive  for dryness and rash Hematologic/lymphatic: negative Musculoskeletal:negative Neurological: negative Behavioral/Psych: negative Endocrine: negative Allergic/Immunologic: negative   PHYSICAL EXAMINATION: General appearance: alert, cooperative and no distress Head: Normocephalic, without obvious abnormality, atraumatic Neck: no adenopathy, no JVD, supple, symmetrical, trachea midline and thyroid not enlarged, symmetric, no tenderness/mass/nodules Lymph nodes: Cervical, supraclavicular, and axillary nodes normal. Resp: clear to auscultation bilaterally Back: symmetric, no curvature. ROM normal. No CVA tenderness. Cardio: regular rate and rhythm, S1, S2 normal, no murmur, click, rub or gallop GI: soft, non-tender; bowel sounds normal; no masses,  no organomegaly Extremities: extremities normal, atraumatic, no cyanosis or edema Neurologic: Alert and oriented X 3, normal strength and tone. Normal symmetric reflexes. Normal coordination and gait Skin: Grade 1 erythematous macular to acneform eruptions on the cheeks and both forearms, greater on the left forearm than on the right. No evidence of super infection  ECOG PERFORMANCE STATUS: 1 - Symptomatic but completely ambulatory  Blood pressure 133/38, pulse 64, temperature 97.9 F (36.6 C), temperature source Oral, resp. rate 18, height _0  (1.575 m), weight 166 lb 9.6 oz (75.569 kg), SpO2 100 %.  LABORATORY DATA: Lab Results  Component Value Date   WBC 6.8 12/01/2014   HGB 10.7* 12/01/2014   HCT 32.9* 12/01/2014   MCV 88.8 12/01/2014   PLT 239 12/01/2014      Chemistry      Component Value Date/Time   NA 140 12/01/2014 1503   NA 136 09/23/2012 1525   K 3.8 12/01/2014 1503   K 4.0 09/23/2012 1525   CL 102 05/06/2013 1511   CL 101 09/23/2012 1525   CO2 31* 12/01/2014 1503   CO2 26 09/23/2012 1525   BUN 28.3* 12/01/2014 1503   BUN 30* 09/23/2012 1525   CREATININE 1.0 12/01/2014 1503   CREATININE 0.84 09/23/2012 1525        Component Value Date/Time   CALCIUM 9.4 12/01/2014 1503   CALCIUM 8.9 09/23/2012 1525   ALKPHOS 58 12/01/2014 1503   ALKPHOS 52 09/23/2012 1525   AST 22 12/01/2014 1503   AST 24 09/23/2012 1525   ALT 18 12/01/2014 1503   ALT 31 09/23/2012 1525   BILITOT 0.51 12/01/2014 1503   BILITOT 0.8 09/23/2012 1525       RADIOGRAPHIC STUDIES: No results found. ASSESSMENT AND PLAN: This is a very pleasant 69 years old Hispanic female with metastatic non-small cell lung cancer, adenocarcinoma currently on treatment with Tarceva 150 mg by mouth daily status post 24 months of treatment and tolerating it fairly well. The patient has no significant complaints today except for the flare of the skin rash. Her recent CT scan of the chest, abdomen and pelvis showed no evidence for disease progression. Patient was discussed with and also seen by Dr. Julien Nordmann. Regarding her skin rash, now that the flare has calmed down since she is avoiding spicy food she does not need to complete the course of doxycycline. She is asked to reserve this for her next flare. She voiced understanding. She will continue on Tarceva at 150 mg by mouth daily.  For  the metastatic bone disease, she will continue on Xgeva as previously scheduled.  For the iron deficiency, the patient will continue on over the counter ferrous sulfate. She will follow-up in one month with repeat CBC differential and C met for another symptom management visit.  She was advised to call immediately if she has any concerning symptoms in the interval.  The patient voices understanding of current disease status and treatment options and is in agreement with the current care plan.  All questions were answered. The patient knows to call the clinic with any problems, questions or concerns. We can certainly see the patient much sooner if necessary.  Carlton Adam PA-C  ADDENDUM: Hematology/Oncology Attending: I had a face to face encounter with the patient.  I recommended her care plan. This is a very pleasant 69 years old Hispanic female with metastatic non-small cell lung cancer, adenocarcinoma with positive EGFR mutation. The patient is currently on treatment with Tarceva 150 mg by mouth daily for the last 24 months and tolerating her treatment fairly well. Her skin rash has improved after the patient stops eating spicy food. She did not use the prescribed doxycycline but she will use it for any flare in the future. I recommended for the patient to continue her current treatment with Tarceva with the same dose. She would come back for follow-up visit in one month's for reevaluation with repeat blood work. She was advised to call immediately if she has any concerning symptoms in the interval.  Disclaimer: This note was dictated with voice recognition software. Similar sounding words can inadvertently be transcribed and may not be corrected upon review. [name]., MD 12/02/2014

## 2014-12-01 NOTE — Telephone Encounter (Signed)
gv and printed appt sched and avs for pt for [facility]

## 2014-12-02 NOTE — Patient Instructions (Signed)
Continue Tarceva 150 mg by mouth daily Follow-up in one month

## 2015-01-12 ENCOUNTER — Ambulatory Visit (HOSPITAL_BASED_OUTPATIENT_CLINIC_OR_DEPARTMENT_OTHER): Payer: 59 | Admitting: Internal Medicine

## 2015-01-12 ENCOUNTER — Other Ambulatory Visit (HOSPITAL_BASED_OUTPATIENT_CLINIC_OR_DEPARTMENT_OTHER): Payer: 59

## 2015-01-12 ENCOUNTER — Encounter: Payer: Self-pay | Admitting: Internal Medicine

## 2015-01-12 ENCOUNTER — Telehealth: Payer: Self-pay | Admitting: Internal Medicine

## 2015-01-12 VITALS — BP 150/44 | HR 70 | Temp 97.9°F | Resp 18 | Ht 62.0 in | Wt 162.5 lb

## 2015-01-12 DIAGNOSIS — C3432 Malignant neoplasm of lower lobe, left bronchus or lung: Secondary | ICD-10-CM

## 2015-01-12 DIAGNOSIS — R21 Rash and other nonspecific skin eruption: Secondary | ICD-10-CM

## 2015-01-12 DIAGNOSIS — C799 Secondary malignant neoplasm of unspecified site: Secondary | ICD-10-CM

## 2015-01-12 DIAGNOSIS — D509 Iron deficiency anemia, unspecified: Secondary | ICD-10-CM

## 2015-01-12 DIAGNOSIS — C7951 Secondary malignant neoplasm of bone: Secondary | ICD-10-CM

## 2015-01-12 LAB — COMPREHENSIVE METABOLIC PANEL (CC13)
ALK PHOS: 61 U/L (ref 40–150)
ALT: 17 U/L (ref 0–55)
AST: 18 U/L (ref 5–34)
Albumin: 3.3 g/dL — ABNORMAL LOW (ref 3.5–5.0)
Anion Gap: 9 mEq/L (ref 3–11)
BILIRUBIN TOTAL: 0.39 mg/dL (ref 0.20–1.20)
BUN: 33.5 mg/dL — ABNORMAL HIGH (ref 7.0–26.0)
CO2: 28 mEq/L (ref 22–29)
CREATININE: 1.2 mg/dL — AB (ref 0.6–1.1)
Calcium: 9.3 mg/dL (ref 8.4–10.4)
Chloride: 102 mEq/L (ref 98–109)
EGFR: 48 mL/min/{1.73_m2} — AB (ref >90)
Glucose: 126 mg/dl (ref 70–140)
POTASSIUM: 3.8 meq/L (ref 3.5–5.1)
SODIUM: 140 meq/L (ref 136–145)
Total Protein: 6.7 g/dL (ref 6.4–8.3)

## 2015-01-12 LAB — CBC WITH DIFFERENTIAL/PLATELET
BASO%: 0.2 % (ref 0.0–2.0)
Basophils Absolute: 0 10*3/uL (ref 0.0–0.1)
EOS%: 2.1 % (ref 0.0–7.0)
Eosinophils Absolute: 0.1 10*3/uL (ref 0.0–0.5)
HEMATOCRIT: 31.3 % — AB (ref 34.8–46.6)
HGB: 10.2 g/dL — ABNORMAL LOW (ref 11.6–15.9)
LYMPH#: 1.1 10*3/uL (ref 0.9–3.3)
LYMPH%: 18 % (ref 14.0–49.7)
MCH: 29.3 pg (ref 25.1–34.0)
MCHC: 32.6 g/dL (ref 31.5–36.0)
MCV: 89.9 fL (ref 79.5–101.0)
MONO#: 0.4 10*3/uL (ref 0.1–0.9)
MONO%: 6.2 % (ref 0.0–14.0)
NEUT#: 4.5 10*3/uL (ref 1.5–6.5)
NEUT%: 73.5 % (ref 38.4–76.8)
Platelets: 211 10*3/uL (ref 145–400)
RBC: 3.48 10*6/uL — ABNORMAL LOW (ref 3.70–5.45)
RDW: 13.6 % (ref 11.2–14.5)
WBC: 6.2 10*3/uL (ref 3.9–10.3)

## 2015-01-12 NOTE — Telephone Encounter (Signed)
Gave avs & calendar for March. °

## 2015-01-12 NOTE — Progress Notes (Signed)
     [facility] [phone]   [phone]  OFFICE PROGRESS NOTE  CLOWARD,DAVIS L, MD [address] [place] [address]  DIAGNOSIS: Metastatic non-small cell lung cancer, adenocarcinoma with positive EGFR mutation in exon 19 and negative ALK gene translocation diagnosed in July 2013 with bone metastasis.   PRIOR THERAPY:  1) Status post radiotherapy to the left face rib metastasis under the care of Dr. Moody.  2) stereotactic radiotherapy to the enlarging left lower lobe lung nodule under the care of Dr. Moody.  CURRENT THERAPY:  1) treatment with Tarceva 150 mg by mouth daily, therapy beginning 07/20/2012. Status post approximately 25 months of therapy.  2) Xgeva 120 mg subcutaneously every 4 weeks for bone disease.   CHEMOTHERAPY INTENT: Palliative  CURRENT # OF CHEMOTHERAPY CYCLES: 26 CURRENT ANTIEMETICS: Compazine  CURRENT SMOKING STATUS: Never smoker  ORAL CHEMOTHERAPY AND CONSENT: Tarceva  CURRENT BISPHOSPHONATES USE: Xgeva  PAIN MANAGEMENT: None  NARCOTICS INDUCED CONSTIPATION: None  LIVING WILL AND CODE STATUS: Full code  INTERVAL HISTORY: Jacqueline Leonard 68 y.o. female returns to the clinic today for monthly followup visit accompanied by her husband. She is currently on treatment with Tarceva 150 mg by mouth daily fairly well with no significant adverse effects except dry skin and scalp and mild skin rash on the face. She is currently on clindamycin lotion as well as hydrocortisone cream. She denied having any significant diarrhea. She has no fever or chills, no nausea or vomiting. She has no weight loss or night sweats. The patient denied having any significant chest pain, shortness of breath, cough or hemoptysis.   MEDICAL HISTORY: Past Medical History  Diagnosis Date  . Lung mass   . Hypertension   . Hyperlipidemia   . Vitamin D deficiency   . Radiation 07/11/12-07/24/12    Palliative lung tx 30 gray in 10 fx  . Status  post chemoradiation     Tarceva  . External hemorrhoids   . Allergy   . Anxiety   . Cancer, metastatic to bone 06/19/12    bx=L 5th ribmetastatic Adenocarcinoma with known lung mass  . S/P radiation therapy 07/22/14-07/31/14    left lung/60gy/5fx    ALLERGIES:  is allergic to penicillins.  MEDICATIONS:  Current Outpatient Prescriptions  Medication Sig Dispense Refill  . amLODipine (NORVASC) 5 MG tablet Take 5 mg by mouth daily.    . cholecalciferol (VITAMIN D) 1000 UNITS tablet Take 1,000 Units by mouth daily.    . doxycycline (VIBRA-TABS) 100 MG tablet Take 1 tablet (100 mg total) by mouth 2 (two) times daily. 20 tablet 0  . erlotinib (TARCEVA) 150 MG tablet Take 1 tablet (150 mg total) by mouth daily. Take on an empty stomach 1 hour before meals or 2 hours after. 90 tablet 2  . Multiple Minerals-Vitamins (CALCIUM & VIT D3 BONE HEALTH PO) Take 750 mg by mouth 2 (two) times daily.    . potassium chloride (K-DUR) 10 MEQ tablet Take 1 tablet by mouth daily.    . valsartan-hydrochlorothiazide (DIOVAN-HCT) 160-25 MG per tablet Take 1 tablet by mouth daily.     No current facility-administered medications for this visit.    SURGICAL HISTORY:  Past Surgical History  Procedure Laterality Date  . Soft tissue biopsy  06/19/12    L 5th rib=metastatic adenocarcinoma  . Ovarian cyst removal    . Appendectomy      REVIEW OF SYSTEMS:  Constitutional: negative Eyes: negative Ears, nose, mouth, throat,   and face: negative Respiratory: negative Cardiovascular: negative Gastrointestinal: negative Genitourinary:negative Integument/breast: positive for dryness and rash Hematologic/lymphatic: negative Musculoskeletal:negative Neurological: negative Behavioral/Psych: negative Endocrine: negative Allergic/Immunologic: negative   PHYSICAL EXAMINATION: General appearance: alert, cooperative and no distress Head: Normocephalic, without obvious abnormality, atraumatic Neck: no adenopathy, no JVD,  supple, symmetrical, trachea midline and thyroid not enlarged, symmetric, no tenderness/mass/nodules Lymph nodes: Cervical, supraclavicular, and axillary nodes normal. Resp: clear to auscultation bilaterally Back: symmetric, no curvature. ROM normal. No CVA tenderness. Cardio: regular rate and rhythm, S1, S2 normal, no murmur, click, rub or gallop GI: soft, non-tender; bowel sounds normal; no masses,  no organomegaly Extremities: extremities normal, atraumatic, no cyanosis or edema Neurologic: Alert and oriented X 3, normal strength and tone. Normal symmetric reflexes. Normal coordination and gait  ECOG PERFORMANCE STATUS: 1 - Symptomatic but completely ambulatory  Blood pressure 150/44, pulse 70, temperature 97.9 F (36.6 C), temperature source Oral, resp. rate 18, height 5' 2" (1.575 m), weight 162 lb 8 oz (73.71 kg), SpO2 100 %.  LABORATORY DATA: Lab Results  Component Value Date   WBC 6.2 01/12/2015   HGB 10.2* 01/12/2015   HCT 31.3* 01/12/2015   MCV 89.9 01/12/2015   PLT 211 01/12/2015      Chemistry      Component Value Date/Time   NA 140 01/12/2015 1459   NA 136 09/23/2012 1525   K 3.8 01/12/2015 1459   K 4.0 09/23/2012 1525   CL 102 05/06/2013 1511   CL 101 09/23/2012 1525   CO2 28 01/12/2015 1459   CO2 26 09/23/2012 1525   BUN 33.5* 01/12/2015 1459   BUN 30* 09/23/2012 1525   CREATININE 1.2* 01/12/2015 1459   CREATININE 0.84 09/23/2012 1525      Component Value Date/Time   CALCIUM 9.3 01/12/2015 1459   CALCIUM 8.9 09/23/2012 1525   ALKPHOS 61 01/12/2015 1459   ALKPHOS 52 09/23/2012 1525   AST 18 01/12/2015 1459   AST 24 09/23/2012 1525   ALT 17 01/12/2015 1459   ALT 31 09/23/2012 1525   BILITOT 0.39 01/12/2015 1459   BILITOT 0.8 09/23/2012 1525       RADIOGRAPHIC STUDIES: No results found. ASSESSMENT AND PLAN: This is a very pleasant 69 years old Hispanic female with metastatic non-small cell lung cancer, adenocarcinoma currently on treatment with  Tarceva 150 mg by mouth daily status post 23 months of treatment and tolerating it fairly well. The patient has no significant complaints today except dry skin as well as a skin rash. I recommended for the patient to continue her treatment with Tarceva with the same dose. For the skin rash, she will continue on clindamycin and hydrocortisone lotion. For the metastatic bone disease, she will continue on Xgeva as previously scheduled.  For the iron deficiency, the patient will continue on over the counter ferrous sulfate. She would come back for followup visit in one month after repeating CBC and comprehensive metabolic panel as well as repeat CT scan of the chest, abdomen pelvis for restaging of her disease. She was advised to call immediately if she has any concerning symptoms in the interval.  The patient voices understanding of current disease status and treatment options and is in agreement with the current care plan.  All questions were answered. The patient knows to call the clinic with any problems, questions or concerns. We can certainly see the patient much sooner if necessary.  Disclaimer: This note was dictated with voice recognition software. Similar sounding words can inadvertently be  transcribed and may not be corrected upon review.

## 2015-02-01 ENCOUNTER — Telehealth: Payer: Self-pay | Admitting: *Deleted

## 2015-02-01 NOTE — Telephone Encounter (Signed)
-----   Message from Carlton Adam, PA-C sent at 02/01/2015  3:38 PM EST ----- Abnormal results, please call and notify patient to push po fluids. Kidney function slightly elevated.

## 2015-02-01 NOTE — Telephone Encounter (Signed)
Called and informed patient to increase po fluids.  Kidney function is slightly elevated. Per Awilda Metro, PA.  Patient verbalized understanding.

## 2015-02-09 ENCOUNTER — Ambulatory Visit (HOSPITAL_COMMUNITY)
Admission: RE | Admit: 2015-02-09 | Discharge: 2015-02-09 | Disposition: A | Discharge status: Home / Self Care | Payer: 59 | Source: Ambulatory Visit | Attending: Internal Medicine | Admitting: Internal Medicine

## 2015-02-09 ENCOUNTER — Other Ambulatory Visit (HOSPITAL_BASED_OUTPATIENT_CLINIC_OR_DEPARTMENT_OTHER): Payer: 59

## 2015-02-09 DIAGNOSIS — C799 Secondary malignant neoplasm of unspecified site: Secondary | ICD-10-CM

## 2015-02-09 DIAGNOSIS — Z9221 Personal history of antineoplastic chemotherapy: Secondary | ICD-10-CM | POA: Insufficient documentation

## 2015-02-09 DIAGNOSIS — C3432 Malignant neoplasm of lower lobe, left bronchus or lung: Secondary | ICD-10-CM

## 2015-02-09 DIAGNOSIS — C7802 Secondary malignant neoplasm of left lung: Secondary | ICD-10-CM | POA: Diagnosis present

## 2015-02-09 LAB — COMPREHENSIVE METABOLIC PANEL (CC13)
ALT: 19 U/L (ref 0–55)
ANION GAP: 8 meq/L (ref 3–11)
AST: 20 U/L (ref 5–34)
Albumin: 3.5 g/dL (ref 3.5–5.0)
Alkaline Phosphatase: 54 U/L (ref 40–150)
BUN: 20.8 mg/dL (ref 7.0–26.0)
CO2: 29 meq/L (ref 22–29)
Calcium: 9 mg/dL (ref 8.4–10.4)
Chloride: 104 mEq/L (ref 98–109)
Creatinine: 0.8 mg/dL (ref 0.6–1.1)
EGFR: 75 mL/min/{1.73_m2} — ABNORMAL LOW (ref >90)
Glucose: 88 mg/dl (ref 70–140)
Potassium: 3.8 mEq/L (ref 3.5–5.1)
SODIUM: 141 meq/L (ref 136–145)
TOTAL PROTEIN: 7.1 g/dL (ref 6.4–8.3)
Total Bilirubin: 0.53 mg/dL (ref 0.20–1.20)

## 2015-02-09 LAB — CBC WITH DIFFERENTIAL/PLATELET
BASO%: 0.3 % (ref 0.0–2.0)
BASOS ABS: 0 10*3/uL (ref 0.0–0.1)
EOS ABS: 0.2 10*3/uL (ref 0.0–0.5)
EOS%: 2.9 % (ref 0.0–7.0)
HCT: 33.6 % — ABNORMAL LOW (ref 34.8–46.6)
HGB: 11 g/dL — ABNORMAL LOW (ref 11.6–15.9)
LYMPH%: 19.6 % (ref 14.0–49.7)
MCH: 29.5 pg (ref 25.1–34.0)
MCHC: 32.7 g/dL (ref 31.5–36.0)
MCV: 90.1 fL (ref 79.5–101.0)
MONO#: 0.5 10*3/uL (ref 0.1–0.9)
MONO%: 7.7 % (ref 0.0–14.0)
NEUT%: 69.5 % (ref 38.4–76.8)
NEUTROS ABS: 4.2 10*3/uL (ref 1.5–6.5)
Platelets: 234 10*3/uL (ref 145–400)
RBC: 3.73 10*6/uL (ref 3.70–5.45)
RDW: 14.1 % (ref 11.2–14.5)
WBC: 6.1 10*3/uL (ref 3.9–10.3)
lymph#: 1.2 10*3/uL (ref 0.9–3.3)

## 2015-02-09 MED ORDER — IOHEXOL 300 MG/ML  SOLN
100.0000 mL | Freq: Once | INTRAMUSCULAR | Status: AC | PRN
  Administered 2015-02-09: 100 mL via INTRAVENOUS

## 2015-02-16 ENCOUNTER — Telehealth: Payer: Self-pay | Admitting: Internal Medicine

## 2015-02-16 ENCOUNTER — Encounter: Payer: Self-pay | Admitting: Internal Medicine

## 2015-02-16 ENCOUNTER — Ambulatory Visit (HOSPITAL_BASED_OUTPATIENT_CLINIC_OR_DEPARTMENT_OTHER): Payer: 59 | Admitting: Internal Medicine

## 2015-02-16 VITALS — BP 147/47 | HR 64 | Temp 98.2°F | Resp 18 | Ht 62.0 in | Wt 167.2 lb

## 2015-02-16 DIAGNOSIS — C7951 Secondary malignant neoplasm of bone: Secondary | ICD-10-CM | POA: Diagnosis not present

## 2015-02-16 DIAGNOSIS — C799 Secondary malignant neoplasm of unspecified site: Secondary | ICD-10-CM

## 2015-02-16 DIAGNOSIS — C3432 Malignant neoplasm of lower lobe, left bronchus or lung: Secondary | ICD-10-CM | POA: Diagnosis not present

## 2015-02-16 DIAGNOSIS — E611 Iron deficiency: Secondary | ICD-10-CM | POA: Diagnosis not present

## 2015-02-16 DIAGNOSIS — R21 Rash and other nonspecific skin eruption: Secondary | ICD-10-CM

## 2015-02-16 NOTE — Telephone Encounter (Signed)
gave and printed appt sched and avs for pt for May

## 2015-02-16 NOTE — Progress Notes (Signed)
[place] [phone]   [phone]  OFFICE PROGRESS NOTE  [name], MD [place] [address]  DIAGNOSIS: Metastatic non-small cell lung cancer, adenocarcinoma with positive EGFR mutation in exon 19 and negative ALK gene translocation diagnosed in July 2013 with bone metastasis.   PRIOR THERAPY:  1) Status post radiotherapy to the left face rib metastasis under the care of Dr. Lisbeth Renshaw.  2) stereotactic radiotherapy to the enlarging left lower lobe lung nodule under the care of Dr. Lisbeth Renshaw.  CURRENT THERAPY:  1) treatment with Tarceva 150 mg by mouth daily, therapy beginning 07/20/2012. Status post approximately 26 months of therapy.  2) Xgeva 120 mg subcutaneously every 4 weeks for bone disease.   CHEMOTHERAPY INTENT: Palliative  CURRENT # OF CHEMOTHERAPY CYCLES: 27 CURRENT ANTIEMETICS: Compazine  CURRENT SMOKING STATUS: Never smoker  ORAL CHEMOTHERAPY AND CONSENT: Tarceva  CURRENT BISPHOSPHONATES USE: Xgeva  PAIN MANAGEMENT: None  NARCOTICS INDUCED CONSTIPATION: None  LIVING WILL AND CODE STATUS: Full code  INTERVAL HISTORY: Jacqueline Leonard 69 y.o. female returns to the clinic today for monthly followup visit accompanied by her husband. She is currently on treatment with Tarceva 150 mg by mouth daily fairly well with no significant adverse effects except dry skin. She is currently on clindamycin lotion as well as hydrocortisone cream for mild skin rash. She denied having any significant diarrhea. She has no fever or chills, no nausea or vomiting. She has no weight loss or night sweats. The patient denied having any significant chest pain, shortness of breath, cough or hemoptysis. She had repeat CT scan of the chest, abdomen and pelvis performed recently and she is here for evaluation and discussion of her scan results.  MEDICAL HISTORY: Past Medical History  Diagnosis Date  . Lung mass   . Hypertension   .  Hyperlipidemia   . Vitamin D deficiency   . Radiation 07/11/12-07/24/12    Palliative lung tx 30 gray in 10 fx  . Status post chemoradiation     Tarceva  . External hemorrhoids   . Allergy   . Anxiety   . Cancer, metastatic to bone 06/19/12    bx=L 5th ribmetastatic Adenocarcinoma with known lung mass  . S/P radiation therapy 07/22/14-07/31/14    left lung/60gy/13f    ALLERGIES:  is allergic to penicillins.  MEDICATIONS:  Current Outpatient Prescriptions  Medication Sig Dispense Refill  . amLODipine (NORVASC) 5 MG tablet Take 5 mg by mouth daily.    . cholecalciferol (VITAMIN D) 1000 UNITS tablet Take 1,000 Units by mouth daily.    .Marland Kitchendoxycycline (VIBRA-TABS) 100 MG tablet Take 1 tablet (100 mg total) by mouth 2 (two) times daily. 20 tablet 0  . erlotinib (TARCEVA) 150 MG tablet Take 1 tablet (150 mg total) by mouth daily. Take on an empty stomach 1 hour before meals or 2 hours after. 90 tablet 2  . Multiple Minerals-Vitamins (CALCIUM & VIT D3 BONE HEALTH PO) Take 750 mg by mouth 2 (two) times daily.    . potassium chloride (K-DUR) 10 MEQ tablet Take 1 tablet by mouth daily.    . valsartan-hydrochlorothiazide (DIOVAN-HCT) 160-25 MG per tablet Take 1 tablet by mouth daily.     No current facility-administered medications for this visit.    SURGICAL HISTORY:  Past Surgical History  Procedure Laterality Date  . Soft tissue biopsy  06/19/12    L 5th rib=metastatic adenocarcinoma  . Ovarian cyst removal    .  Appendectomy      REVIEW OF SYSTEMS:  Constitutional: negative Eyes: negative Ears, nose, mouth, throat, and face: negative Respiratory: negative Cardiovascular: negative Gastrointestinal: negative Genitourinary:negative Integument/breast: positive for dryness and rash Hematologic/lymphatic: negative Musculoskeletal:negative Neurological: negative Behavioral/Psych: negative Endocrine: negative Allergic/Immunologic: negative   PHYSICAL EXAMINATION: General appearance:  alert, cooperative and no distress Head: Normocephalic, without obvious abnormality, atraumatic Neck: no adenopathy, no JVD, supple, symmetrical, trachea midline and thyroid not enlarged, symmetric, no tenderness/mass/nodules Lymph nodes: Cervical, supraclavicular, and axillary nodes normal. Resp: clear to auscultation bilaterally Back: symmetric, no curvature. ROM normal. No CVA tenderness. Cardio: regular rate and rhythm, S1, S2 normal, no murmur, click, rub or gallop GI: soft, non-tender; bowel sounds normal; no masses,  no organomegaly Extremities: extremities normal, atraumatic, no cyanosis or edema Neurologic: Alert and oriented X 3, normal strength and tone. Normal symmetric reflexes. Normal coordination and gait  ECOG PERFORMANCE STATUS: 1 - Symptomatic but completely ambulatory  Blood pressure 147/47, pulse 64, temperature 98.2 F (36.8 C), temperature source Oral, resp. rate 18, height 5' 2"  (1.575 m), weight 167 lb 3.2 oz (75.841 kg), SpO2 100 %.  LABORATORY DATA: Lab Results  Component Value Date   WBC 6.1 02/09/2015   HGB 11.0* 02/09/2015   HCT 33.6* 02/09/2015   MCV 90.1 02/09/2015   PLT 234 02/09/2015      Chemistry      Component Value Date/Time   NA 141 02/09/2015 1531   NA 136 09/23/2012 1525   K 3.8 02/09/2015 1531   K 4.0 09/23/2012 1525   CL 102 05/06/2013 1511   CL 101 09/23/2012 1525   CO2 29 02/09/2015 1531   CO2 26 09/23/2012 1525   BUN 20.8 02/09/2015 1531   BUN 30* 09/23/2012 1525   CREATININE 0.8 02/09/2015 1531   CREATININE 0.84 09/23/2012 1525      Component Value Date/Time   CALCIUM 9.0 02/09/2015 1531   CALCIUM 8.9 09/23/2012 1525   ALKPHOS 54 02/09/2015 1531   ALKPHOS 52 09/23/2012 1525   AST 20 02/09/2015 1531   AST 24 09/23/2012 1525   ALT 19 02/09/2015 1531   ALT 31 09/23/2012 1525   BILITOT 0.53 02/09/2015 1531   BILITOT 0.8 09/23/2012 1525       RADIOGRAPHIC STUDIES: Ct Chest W Contrast  02/09/2015   CLINICAL DATA:   Stage IV lung cancer 2013, oral chemo ongoing. Subsequent treatment evaluation.  EXAM: CT CHEST, ABDOMEN, AND PELVIS WITH CONTRAST  TECHNIQUE: Multidetector CT imaging of the chest, abdomen and pelvis was performed following the standard protocol during bolus administration of intravenous contrast.  CONTRAST:  182m OMNIPAQUE IOHEXOL 300 MG/ML  SOLN  COMPARISON:  CT 10/27/2014  FINDINGS: CT CHEST FINDINGS  Mediastinum/Nodes: No axillary or supraclavicular lymphadenopathy. No mediastinal hilar lymphadenopathy. No pericardial fluid. Esophagus is normal.  Lungs/Pleura: Focus of consolidation with bronchiectasis and air bronchograms in the medial left lower lobe is not changed from prior (image 28, series 4). There is pleural thickening along the left lateral chest wall.  In the right upper lobe small lung nodule measures 7 mm compared to 6 mm on prior (image 14, series 4). There is a mild reticular pattern at the lung bases. Mild pleural-parenchymal thickening at right lung base left lung base.  Musculoskeletal: There is expansile lesion associated with the left fourth rib laterally (image 19, series 4.  CT ABDOMEN AND PELVIS FINDINGS  Hepatobiliary: Tiny hypodensity in the posterior right hepatic lobe is not changed. Normal gallbladder.  Pancreas: Pancreas is  normal. No ductal dilatation. No pancreatic inflammation.  Spleen: Normal spleen  Adrenals/Urinary Tract: Adrenal glands and kidneys are normal. Extrarenal pelvis on the left. No obstructing lesion. There is a nonenhancing cysts in lower pole of the left kidney.  Stomach/Bowel: Stomach, small bowel, cecum normal. The colon and rectosigmoid colon are normal.  Vascular/Lymphatic: Abdominal aorta is normal caliber. There is no retroperitoneal or periportal lymphadenopathy. No pelvic lymphadenopathy.  Reproductive: Uterus and ovaries are normal. No pelvic lymphadenopathy  Other: No peritoneal disease or omental disease.  Musculoskeletal: Bilateral pars defects at L5  with grade 1 anterolisthesis.  IMPRESSION: Chest Impression:  1. Stable consolidated pattern in the left lower lobe is consists radiation change. 2. Several small right upper lobe nodules are not changed. 3. Stable expansile sclerotic left fourth rib lesion.  Abdomen / Pelvis Impression:  1. No evidence of metastasis in the abdomen pelvis.   Electronically Signed   By: Suzy Bouchard M.D.   On: 02/09/2015 17:55   Ct Abdomen Pelvis W Contrast  02/09/2015   CLINICAL DATA:  Stage IV lung cancer 2013, oral chemo ongoing. Subsequent treatment evaluation.  EXAM: CT CHEST, ABDOMEN, AND PELVIS WITH CONTRAST  TECHNIQUE: Multidetector CT imaging of the chest, abdomen and pelvis was performed following the standard protocol during bolus administration of intravenous contrast.  CONTRAST:  172m OMNIPAQUE IOHEXOL 300 MG/ML  SOLN  COMPARISON:  CT 10/27/2014  FINDINGS: CT CHEST FINDINGS  Mediastinum/Nodes: No axillary or supraclavicular lymphadenopathy. No mediastinal hilar lymphadenopathy. No pericardial fluid. Esophagus is normal.  Lungs/Pleura: Focus of consolidation with bronchiectasis and air bronchograms in the medial left lower lobe is not changed from prior (image 28, series 4). There is pleural thickening along the left lateral chest wall.  In the right upper lobe small lung nodule measures 7 mm compared to 6 mm on prior (image 14, series 4). There is a mild reticular pattern at the lung bases. Mild pleural-parenchymal thickening at right lung base left lung base.  Musculoskeletal: There is expansile lesion associated with the left fourth rib laterally (image 19, series 4.  CT ABDOMEN AND PELVIS FINDINGS  Hepatobiliary: Tiny hypodensity in the posterior right hepatic lobe is not changed. Normal gallbladder.  Pancreas: Pancreas is normal. No ductal dilatation. No pancreatic inflammation.  Spleen: Normal spleen  Adrenals/Urinary Tract: Adrenal glands and kidneys are normal. Extrarenal pelvis on the left. No obstructing  lesion. There is a nonenhancing cysts in lower pole of the left kidney.  Stomach/Bowel: Stomach, small bowel, cecum normal. The colon and rectosigmoid colon are normal.  Vascular/Lymphatic: Abdominal aorta is normal caliber. There is no retroperitoneal or periportal lymphadenopathy. No pelvic lymphadenopathy.  Reproductive: Uterus and ovaries are normal. No pelvic lymphadenopathy  Other: No peritoneal disease or omental disease.  Musculoskeletal: Bilateral pars defects at L5 with grade 1 anterolisthesis.  IMPRESSION: Chest Impression:  1. Stable consolidated pattern in the left lower lobe is consists radiation change. 2. Several small right upper lobe nodules are not changed. 3. Stable expansile sclerotic left fourth rib lesion.  Abdomen / Pelvis Impression:  1. No evidence of metastasis in the abdomen pelvis.   Electronically Signed   By: SSuzy BouchardM.D.   On: 02/09/2015 17:55   ASSESSMENT AND PLAN: This is a very pleasant 69years old Hispanic female with metastatic non-small cell lung cancer, adenocarcinoma currently on treatment with Tarceva 150 mg by mouth daily status post 23 months of treatment and tolerating it fairly well. The patient has no significant complaints today except  dry skin as well as a skin rash. The recent CT scan of the chest, abdomen and pelvis showed no evidence for disease progression. I discussed the scan results with the patient and her husband. I recommended for the patient to continue her treatment with Tarceva with the same dose. For the skin rash, she will continue on clindamycin and hydrocortisone lotion. For the metastatic bone disease, she will continue on Xgeva as previously scheduled.  For the iron deficiency, the patient will continue on over the counter ferrous sulfate. She would come back for followup visit in 2 months after repeating CBC and comprehensive metabolic panel for evaluation. She was advised to call immediately if she has any concerning symptoms in  the interval.  The patient voices understanding of current disease status and treatment options and is in agreement with the current care plan.  All questions were answered. The patient knows to call the clinic with any problems, questions or concerns. We can certainly see the patient much sooner if necessary.  Disclaimer: This note was dictated with voice recognition software. Similar sounding words can inadvertently be transcribed and may not be corrected upon review.

## 2015-03-04 ENCOUNTER — Ambulatory Visit: Payer: 59 | Admitting: Radiation Oncology

## 2015-03-18 ENCOUNTER — Encounter: Payer: Self-pay | Admitting: Radiation Oncology

## 2015-03-18 ENCOUNTER — Ambulatory Visit
Admission: RE | Admit: 2015-03-18 | Discharge: 2015-03-18 | Disposition: A | Discharge status: Home / Self Care | Payer: 59 | Source: Ambulatory Visit | Attending: Radiation Oncology | Admitting: Radiation Oncology

## 2015-03-18 VITALS — BP 131/43 | HR 61 | Temp 98.1°F | Ht 62.0 in | Wt 165.2 lb

## 2015-03-18 DIAGNOSIS — C7802 Secondary malignant neoplasm of left lung: Secondary | ICD-10-CM

## 2015-03-18 NOTE — Progress Notes (Signed)
  Radiation Oncology         [phone] ________________________________  Name: Jacqueline Leonard MRN: [id number]  Date: 03/18/2015  DOB: Nov 19, 1946  Follow-Up Visit Note  CC: [name], MD  Curt Bears, MD  Diagnosis:   Metastatic non-small cell lung cancer, adenocarcinoma  Interval Since Last Radiation:  The patient completed stereotactic body radiation treatment on 07/31/2014 to the left lung   Narrative:  The patient returns today for routine follow-up.   She states that she continues to do very well. She is continuing with Tarceva chemotherapy. The patient saw Dr. Earlie Server several weeks ago after undergoing restaging T scans. This scan looked quite good. Some radiation change was seen within the left lower lobe without any evidence of recurrence/progression. She is tolerating Tarceva very well she states.   ALLERGIES:  is allergic to penicillins.  Meds: Current Outpatient Prescriptions  Medication Sig Dispense Refill  . amLODipine (NORVASC) 5 MG tablet Take 5 mg by mouth daily.    . cholecalciferol (VITAMIN D) 1000 UNITS tablet Take 1,000 Units by mouth daily.    Marland Kitchen erlotinib (TARCEVA) 150 MG tablet Take 1 tablet (150 mg total) by mouth daily. Take on an empty stomach 1 hour before meals or 2 hours after. 90 tablet 2  . Multiple Minerals-Vitamins (CALCIUM & VIT D3 BONE HEALTH PO) Take 750 mg by mouth 2 (two) times daily.    . potassium chloride (K-DUR) 10 MEQ tablet Take 1 tablet by mouth daily.    . valsartan-hydrochlorothiazide (DIOVAN-HCT) 160-25 MG per tablet Take 1 tablet by mouth daily.     No current facility-administered medications for this encounter.    Physical Findings: The patient is in no acute distress. Patient is alert and oriented.  height is '5\' 2"'$  (1.575 m) and weight is 165 lb 3.2 oz (74.934 kg). Her temperature is 98.1 F (36.7 C). Her blood pressure is 131/43 and her pulse is 61. .   Lungs are clear to auscultation bilaterally   Lab  Findings: Lab Results  Component Value Date   WBC 6.1 02/09/2015   HGB 11.0* 02/09/2015   HCT 33.6* 02/09/2015   MCV 90.1 02/09/2015   PLT 234 02/09/2015     Radiographic Findings: No results found.  Impression:    The patient is clinically doing well. She is continuing on Tarceva, doing very well on this.   Plan:  Followup in 6 months.   [name], M.D., Ph.D.   The patient was seen today for 10 minutes, with the majority of the time spent counseling the patient on his diagnosis of cancer and coordinating his care.

## 2015-04-27 ENCOUNTER — Ambulatory Visit (HOSPITAL_BASED_OUTPATIENT_CLINIC_OR_DEPARTMENT_OTHER): Payer: 59 | Admitting: Internal Medicine

## 2015-04-27 ENCOUNTER — Other Ambulatory Visit (HOSPITAL_BASED_OUTPATIENT_CLINIC_OR_DEPARTMENT_OTHER): Payer: 59

## 2015-04-27 ENCOUNTER — Encounter: Payer: Self-pay | Admitting: Internal Medicine

## 2015-04-27 ENCOUNTER — Telehealth: Payer: Self-pay | Admitting: Internal Medicine

## 2015-04-27 VITALS — BP 128/47 | HR 66 | Temp 98.2°F | Resp 17 | Ht 62.0 in | Wt 166.9 lb

## 2015-04-27 DIAGNOSIS — C3432 Malignant neoplasm of lower lobe, left bronchus or lung: Secondary | ICD-10-CM | POA: Diagnosis not present

## 2015-04-27 DIAGNOSIS — C7951 Secondary malignant neoplasm of bone: Secondary | ICD-10-CM

## 2015-04-27 DIAGNOSIS — C799 Secondary malignant neoplasm of unspecified site: Secondary | ICD-10-CM

## 2015-04-27 DIAGNOSIS — E611 Iron deficiency: Secondary | ICD-10-CM

## 2015-04-27 DIAGNOSIS — R21 Rash and other nonspecific skin eruption: Secondary | ICD-10-CM | POA: Diagnosis not present

## 2015-04-27 DIAGNOSIS — C7952 Secondary malignant neoplasm of bone marrow: Secondary | ICD-10-CM

## 2015-04-27 LAB — CBC WITH DIFFERENTIAL/PLATELET
BASO%: 0.5 % (ref 0.0–2.0)
BASOS ABS: 0 10*3/uL (ref 0.0–0.1)
EOS%: 3.2 % (ref 0.0–7.0)
Eosinophils Absolute: 0.2 10*3/uL (ref 0.0–0.5)
HEMATOCRIT: 31.4 % — AB (ref 34.8–46.6)
HGB: 10.6 g/dL — ABNORMAL LOW (ref 11.6–15.9)
LYMPH%: 16.9 % (ref 14.0–49.7)
MCH: 29.6 pg (ref 25.1–34.0)
MCHC: 33.7 g/dL (ref 31.5–36.0)
MCV: 87.8 fL (ref 79.5–101.0)
MONO#: 0.5 10*3/uL (ref 0.1–0.9)
MONO%: 9 % (ref 0.0–14.0)
NEUT%: 70.4 % (ref 38.4–76.8)
NEUTROS ABS: 3.7 10*3/uL (ref 1.5–6.5)
PLATELETS: 238 10*3/uL (ref 145–400)
RBC: 3.57 10*6/uL — ABNORMAL LOW (ref 3.70–5.45)
RDW: 13.9 % (ref 11.2–14.5)
WBC: 5.3 10*3/uL (ref 3.9–10.3)
lymph#: 0.9 10*3/uL (ref 0.9–3.3)

## 2015-04-27 LAB — COMPREHENSIVE METABOLIC PANEL (CC13)
ALBUMIN: 3.3 g/dL — AB (ref 3.5–5.0)
ALT: 16 U/L (ref 0–55)
ANION GAP: 10 meq/L (ref 3–11)
AST: 19 U/L (ref 5–34)
Alkaline Phosphatase: 50 U/L (ref 40–150)
BILIRUBIN TOTAL: 0.61 mg/dL (ref 0.20–1.20)
BUN: 28.9 mg/dL — AB (ref 7.0–26.0)
CALCIUM: 8.7 mg/dL (ref 8.4–10.4)
CHLORIDE: 104 meq/L (ref 98–109)
CO2: 27 meq/L (ref 22–29)
CREATININE: 0.8 mg/dL (ref 0.6–1.1)
EGFR: 70 mL/min/{1.73_m2} — ABNORMAL LOW (ref >90)
Glucose: 96 mg/dl (ref 70–140)
Potassium: 3.9 mEq/L (ref 3.5–5.1)
Sodium: 141 mEq/L (ref 136–145)
Total Protein: 6.8 g/dL (ref 6.4–8.3)

## 2015-04-27 NOTE — Progress Notes (Signed)
[facility] [phone]   [phone]  OFFICE PROGRESS NOTE  [name], MD [place] [address]  DIAGNOSIS: Metastatic non-small cell lung cancer, adenocarcinoma with positive EGFR mutation in exon 19 and negative ALK gene translocation diagnosed in July 2013 with bone metastasis.   PRIOR THERAPY:  1) Status post radiotherapy to the left face rib metastasis under the care of Dr. Lisbeth Renshaw.  2) stereotactic radiotherapy to the enlarging left lower lobe lung nodule under the care of Dr. Lisbeth Renshaw.  CURRENT THERAPY:  1) treatment with Tarceva 150 mg by mouth daily, therapy beginning 07/20/2012. Status post approximately 28 months of therapy.  2) Xgeva 120 mg subcutaneously every 4 weeks for bone disease.   CHEMOTHERAPY INTENT: Palliative  CURRENT # OF CHEMOTHERAPY CYCLES: 29 CURRENT ANTIEMETICS: Compazine  CURRENT SMOKING STATUS: Never smoker  ORAL CHEMOTHERAPY AND CONSENT: Tarceva  CURRENT BISPHOSPHONATES USE: Xgeva  PAIN MANAGEMENT: None  NARCOTICS INDUCED CONSTIPATION: None  LIVING WILL AND CODE STATUS: Full code  INTERVAL HISTORY: Jacqueline Leonard 68 y.o. female returns to the clinic today for monthly followup visit accompanied by her husband. The patient history rating her current treatment with Tarceva 150 mg by mouth daily fairly well with no significant adverse effects except dry skin. She denied having any significant diarrhea. She has no fever or chills, no nausea or vomiting. She has no weight loss or night sweats. The patient denied having any significant chest pain, shortness of breath, cough or hemoptysis. She had repeat CBC and comprehensive metabolic panel performed earlier today and she is here for evaluation and discussion of her lab results.  MEDICAL HISTORY: Past Medical History  Diagnosis Date  . Lung mass   . Hypertension   . Hyperlipidemia   . Vitamin D deficiency   . Radiation 07/11/12-07/24/12   Palliative lung tx 30 gray in 10 fx  . Status post chemoradiation     Tarceva  . External hemorrhoids   . Allergy   . Anxiety   . Cancer, metastatic to bone 06/19/12    bx=L 5th ribmetastatic Adenocarcinoma with known lung mass  . S/P radiation therapy 07/22/14-07/31/14    left lung/60gy/40f    ALLERGIES:  is allergic to penicillins.  MEDICATIONS:  Current Outpatient Prescriptions  Medication Sig Dispense Refill  . amLODipine (NORVASC) 5 MG tablet Take 5 mg by mouth daily.    . cholecalciferol (VITAMIN D) 1000 UNITS tablet Take 1,000 Units by mouth daily.    .Marland Kitchenerlotinib (TARCEVA) 150 MG tablet Take 1 tablet (150 mg total) by mouth daily. Take on an empty stomach 1 hour before meals or 2 hours after. 90 tablet 2  . Multiple Minerals-Vitamins (CALCIUM & VIT D3 BONE HEALTH PO) Take 750 mg by mouth 2 (two) times daily.    . potassium chloride (K-DUR) 10 MEQ tablet Take 1 tablet by mouth daily.    . valsartan-hydrochlorothiazide (DIOVAN-HCT) 160-25 MG per tablet Take 1 tablet by mouth daily.     No current facility-administered medications for this visit.    SURGICAL HISTORY:  Past Surgical History  Procedure Laterality Date  . Soft tissue biopsy  06/19/12    L 5th rib=metastatic adenocarcinoma  . Ovarian cyst removal    . Appendectomy      REVIEW OF SYSTEMS:  Constitutional: negative Eyes: negative Ears, nose, mouth, throat, and face: negative Respiratory: negative Cardiovascular: negative Gastrointestinal: negative Genitourinary:negative Integument/breast: positive for dryness and rash Hematologic/lymphatic: negative Musculoskeletal:negative Neurological:  negative Behavioral/Psych: negative Endocrine: negative Allergic/Immunologic: negative   PHYSICAL EXAMINATION: General appearance: alert, cooperative and no distress Head: Normocephalic, without obvious abnormality, atraumatic Neck: no adenopathy, no JVD, supple, symmetrical, trachea midline and thyroid not enlarged,  symmetric, no tenderness/mass/nodules Lymph nodes: Cervical, supraclavicular, and axillary nodes normal. Resp: clear to auscultation bilaterally Back: symmetric, no curvature. ROM normal. No CVA tenderness. Cardio: regular rate and rhythm, S1, S2 normal, no murmur, click, rub or gallop GI: soft, non-tender; bowel sounds normal; no masses,  no organomegaly Extremities: extremities normal, atraumatic, no cyanosis or edema Neurologic: Alert and oriented X 3, normal strength and tone. Normal symmetric reflexes. Normal coordination and gait  ECOG PERFORMANCE STATUS: 1 - Symptomatic but completely ambulatory  Blood pressure 128/47, pulse 66, temperature 98.2 F (36.8 C), temperature source Oral, resp. rate 17, height 5' 2"  (1.575 m), weight 166 lb 14.4 oz (75.705 kg), SpO2 100 %.  LABORATORY DATA: Lab Results  Component Value Date   WBC 5.3 04/27/2015   HGB 10.6* 04/27/2015   HCT 31.4* 04/27/2015   MCV 87.8 04/27/2015   PLT 238 04/27/2015      Chemistry      Component Value Date/Time   NA 141 02/09/2015 1531   NA 136 09/23/2012 1525   K 3.8 02/09/2015 1531   K 4.0 09/23/2012 1525   CL 102 05/06/2013 1511   CL 101 09/23/2012 1525   CO2 29 02/09/2015 1531   CO2 26 09/23/2012 1525   BUN 20.8 02/09/2015 1531   BUN 30* 09/23/2012 1525   CREATININE 0.8 02/09/2015 1531   CREATININE 0.84 09/23/2012 1525      Component Value Date/Time   CALCIUM 9.0 02/09/2015 1531   CALCIUM 8.9 09/23/2012 1525   ALKPHOS 54 02/09/2015 1531   ALKPHOS 52 09/23/2012 1525   AST 20 02/09/2015 1531   AST 24 09/23/2012 1525   ALT 19 02/09/2015 1531   ALT 31 09/23/2012 1525   BILITOT 0.53 02/09/2015 1531   BILITOT 0.8 09/23/2012 1525       RADIOGRAPHIC STUDIES: No results found. ASSESSMENT AND PLAN: This is a very pleasant 69 years old Hispanic female with metastatic non-small cell lung cancer, adenocarcinoma currently on treatment with Tarceva 150 mg by mouth daily status post 28 months of treatment  and tolerating it fairly well. The patient has no significant complaints today except dry skin as well as a skin rash. I recommended for the patient to continue her treatment with Tarceva with the same dose. For the skin rash, she will continue on clindamycin and hydrocortisone lotion. For the metastatic bone disease, she will continue on Xgeva as previously scheduled.  For the iron deficiency, the patient will continue on over the counter ferrous sulfate. She would come back for followup visit in 2 months after repeating CBC and comprehensive metabolic panel as well as CT scan of the chest, abdomen and pelvis for restaging of her disease. She was advised to call immediately if she has any concerning symptoms in the interval.  The patient voices understanding of current disease status and treatment options and is in agreement with the current care plan.  All questions were answered. The patient knows to call the clinic with any problems, questions or concerns. We can certainly see the patient much sooner if necessary.  Disclaimer: This note was dictated with voice recognition software. Similar sounding words can inadvertently be transcribed and may not be corrected upon review.

## 2015-04-27 NOTE — Telephone Encounter (Signed)
Gave avs & calendar for July & August. Also gave ct Contrast for CT scan.

## 2015-04-28 ENCOUNTER — Other Ambulatory Visit: Payer: Self-pay

## 2015-04-28 DIAGNOSIS — Z1231 Encounter for screening mammogram for malignant neoplasm of breast: Secondary | ICD-10-CM

## 2015-05-04 ENCOUNTER — Ambulatory Visit
Admission: RE | Admit: 2015-05-04 | Discharge: 2015-05-04 | Disposition: A | Discharge status: Home / Self Care | Payer: 59 | Source: Ambulatory Visit

## 2015-05-04 DIAGNOSIS — Z1231 Encounter for screening mammogram for malignant neoplasm of breast: Secondary | ICD-10-CM

## 2015-05-26 ENCOUNTER — Ambulatory Visit: Payer: 59

## 2015-06-04 ENCOUNTER — Other Ambulatory Visit: Payer: Self-pay | Admitting: *Deleted

## 2015-06-04 DIAGNOSIS — C799 Secondary malignant neoplasm of unspecified site: Secondary | ICD-10-CM

## 2015-06-04 MED ORDER — ERLOTINIB HCL 150 MG PO TABS: 150.0000 mg | ORAL_TABLET | Freq: Every day | ORAL | Status: DC

## 2015-06-22 ENCOUNTER — Other Ambulatory Visit (HOSPITAL_BASED_OUTPATIENT_CLINIC_OR_DEPARTMENT_OTHER): Payer: 59

## 2015-06-22 ENCOUNTER — Encounter (HOSPITAL_COMMUNITY): Payer: Self-pay

## 2015-06-22 ENCOUNTER — Ambulatory Visit (HOSPITAL_COMMUNITY)
Admission: RE | Admit: 2015-06-22 | Discharge: 2015-06-22 | Disposition: A | Discharge status: Home / Self Care | Payer: 59 | Source: Ambulatory Visit | Attending: Internal Medicine | Admitting: Internal Medicine

## 2015-06-22 DIAGNOSIS — D259 Leiomyoma of uterus, unspecified: Secondary | ICD-10-CM | POA: Diagnosis not present

## 2015-06-22 DIAGNOSIS — C3432 Malignant neoplasm of lower lobe, left bronchus or lung: Secondary | ICD-10-CM

## 2015-06-22 DIAGNOSIS — Z79899 Other long term (current) drug therapy: Secondary | ICD-10-CM | POA: Diagnosis not present

## 2015-06-22 DIAGNOSIS — Z923 Personal history of irradiation: Secondary | ICD-10-CM | POA: Diagnosis not present

## 2015-06-22 DIAGNOSIS — C3490 Malignant neoplasm of unspecified part of unspecified bronchus or lung: Secondary | ICD-10-CM | POA: Insufficient documentation

## 2015-06-22 DIAGNOSIS — I700 Atherosclerosis of aorta: Secondary | ICD-10-CM | POA: Insufficient documentation

## 2015-06-22 DIAGNOSIS — C7951 Secondary malignant neoplasm of bone: Secondary | ICD-10-CM

## 2015-06-22 DIAGNOSIS — R918 Other nonspecific abnormal finding of lung field: Secondary | ICD-10-CM | POA: Insufficient documentation

## 2015-06-22 DIAGNOSIS — E611 Iron deficiency: Secondary | ICD-10-CM

## 2015-06-22 DIAGNOSIS — C799 Secondary malignant neoplasm of unspecified site: Secondary | ICD-10-CM

## 2015-06-22 DIAGNOSIS — C7952 Secondary malignant neoplasm of bone marrow: Secondary | ICD-10-CM

## 2015-06-22 DIAGNOSIS — R590 Localized enlarged lymph nodes: Secondary | ICD-10-CM | POA: Diagnosis not present

## 2015-06-22 DIAGNOSIS — Z08 Encounter for follow-up examination after completed treatment for malignant neoplasm: Secondary | ICD-10-CM | POA: Insufficient documentation

## 2015-06-22 DIAGNOSIS — D1809 Hemangioma of other sites: Secondary | ICD-10-CM | POA: Diagnosis not present

## 2015-06-22 LAB — COMPREHENSIVE METABOLIC PANEL (CC13)
ALK PHOS: 59 U/L (ref 40–150)
ALT: 19 U/L (ref 0–55)
AST: 22 U/L (ref 5–34)
Albumin: 3.5 g/dL (ref 3.5–5.0)
Anion Gap: 7 mEq/L (ref 3–11)
BUN: 25.9 mg/dL (ref 7.0–26.0)
CALCIUM: 9.3 mg/dL (ref 8.4–10.4)
CHLORIDE: 101 meq/L (ref 98–109)
CO2: 29 meq/L (ref 22–29)
Creatinine: 1 mg/dL (ref 0.6–1.1)
EGFR: 60 mL/min/{1.73_m2} — ABNORMAL LOW (ref >90)
Glucose: 91 mg/dl (ref 70–140)
POTASSIUM: 3.8 meq/L (ref 3.5–5.1)
Sodium: 137 mEq/L (ref 136–145)
TOTAL PROTEIN: 7.4 g/dL (ref 6.4–8.3)
Total Bilirubin: 0.62 mg/dL (ref 0.20–1.20)

## 2015-06-22 LAB — CBC WITH DIFFERENTIAL/PLATELET
BASO%: 0.4 % (ref 0.0–2.0)
Basophils Absolute: 0 10*3/uL (ref 0.0–0.1)
EOS%: 2.5 % (ref 0.0–7.0)
Eosinophils Absolute: 0.2 10*3/uL (ref 0.0–0.5)
HCT: 32.9 % — ABNORMAL LOW (ref 34.8–46.6)
HEMOGLOBIN: 10.9 g/dL — AB (ref 11.6–15.9)
LYMPH#: 1.1 10*3/uL (ref 0.9–3.3)
LYMPH%: 14.9 % (ref 14.0–49.7)
MCH: 29 pg (ref 25.1–34.0)
MCHC: 33 g/dL (ref 31.5–36.0)
MCV: 88 fL (ref 79.5–101.0)
MONO#: 0.6 10*3/uL (ref 0.1–0.9)
MONO%: 8 % (ref 0.0–14.0)
NEUT%: 74.2 % (ref 38.4–76.8)
NEUTROS ABS: 5.3 10*3/uL (ref 1.5–6.5)
Platelets: 244 10*3/uL (ref 145–400)
RBC: 3.74 10*6/uL (ref 3.70–5.45)
RDW: 14 % (ref 11.2–14.5)
WBC: 7.1 10*3/uL (ref 3.9–10.3)

## 2015-06-22 MED ORDER — IOHEXOL 300 MG/ML  SOLN
100.0000 mL | Freq: Once | INTRAMUSCULAR | Status: AC | PRN
  Administered 2015-06-22: 100 mL via INTRAVENOUS

## 2015-06-29 ENCOUNTER — Ambulatory Visit (HOSPITAL_BASED_OUTPATIENT_CLINIC_OR_DEPARTMENT_OTHER): Payer: 59 | Admitting: Internal Medicine

## 2015-06-29 ENCOUNTER — Telehealth: Payer: Self-pay | Admitting: Internal Medicine

## 2015-06-29 ENCOUNTER — Encounter: Payer: Self-pay | Admitting: Internal Medicine

## 2015-06-29 VITALS — BP 135/47 | HR 65 | Temp 98.7°F | Resp 18 | Wt 163.4 lb

## 2015-06-29 DIAGNOSIS — C3432 Malignant neoplasm of lower lobe, left bronchus or lung: Secondary | ICD-10-CM

## 2015-06-29 DIAGNOSIS — R21 Rash and other nonspecific skin eruption: Secondary | ICD-10-CM | POA: Diagnosis not present

## 2015-06-29 DIAGNOSIS — C7951 Secondary malignant neoplasm of bone: Secondary | ICD-10-CM

## 2015-06-29 DIAGNOSIS — E611 Iron deficiency: Secondary | ICD-10-CM

## 2015-06-29 DIAGNOSIS — D630 Anemia in neoplastic disease: Secondary | ICD-10-CM

## 2015-06-29 DIAGNOSIS — C7952 Secondary malignant neoplasm of bone marrow: Secondary | ICD-10-CM

## 2015-06-29 DIAGNOSIS — C799 Secondary malignant neoplasm of unspecified site: Secondary | ICD-10-CM

## 2015-06-29 NOTE — Progress Notes (Signed)
[facility] [phone]   [phone]  OFFICE PROGRESS NOTE  CLOWARD,DAVIS L, MD [address] [place] [address]  DIAGNOSIS: Metastatic non-small cell lung cancer, adenocarcinoma with positive EGFR mutation in exon 19 and negative ALK gene translocation diagnosed in July 2013 with bone metastasis.   PRIOR THERAPY:  1) Status post radiotherapy to the left face rib metastasis under the care of Dr. Lisbeth Renshaw.  2) stereotactic radiotherapy to the enlarging left lower lobe lung nodule under the care of Dr. Lisbeth Renshaw.  CURRENT THERAPY:  1) treatment with Tarceva 150 mg by mouth daily, therapy beginning 07/20/2012. Status post approximately 30 months of therapy.  2) Xgeva 120 mg subcutaneously every 4 weeks for bone disease.   CHEMOTHERAPY INTENT: Palliative  CURRENT # OF CHEMOTHERAPY CYCLES: 31 CURRENT ANTIEMETICS: Compazine  CURRENT SMOKING STATUS: Never smoker  ORAL CHEMOTHERAPY AND CONSENT: Tarceva  CURRENT BISPHOSPHONATES USE: Xgeva  PAIN MANAGEMENT: None  NARCOTICS INDUCED CONSTIPATION: None  LIVING WILL AND CODE STATUS: Full code  INTERVAL HISTORY: Jacqueline Leonard 69 y.o. female returns to the clinic today for monthly followup visit accompanied by her husband. The patient is currently on treatment with Tarceva 150 mg by mouth daily and tolerating it fairly well with no significant adverse effects except dry skin. She had recent scalp infection and treated by her dermatologist with doxycycline with improvement in her infection. She denied having any significant diarrhea. She has no fever or chills, no nausea or vomiting. She has no weight loss or night sweats. The patient denied having any significant chest pain, shortness of breath, cough or hemoptysis. She had repeat CT scan of the chest, abdomen and pelvis as well as blood work and she is here for evaluation and discussion of her scan and lab results.  MEDICAL HISTORY: Past Medical History    Diagnosis Date  . Lung mass   . Hypertension   . Hyperlipidemia   . Vitamin D deficiency   . Radiation 07/11/12-07/24/12    Palliative lung tx 30 gray in 10 fx  . Status post chemoradiation     Tarceva  . External hemorrhoids   . Allergy   . Anxiety   . Cancer, metastatic to bone 06/19/12    bx=L 5th ribmetastatic Adenocarcinoma with known lung mass  . S/P radiation therapy 07/22/14-07/31/14    left lung/60gy/52f    ALLERGIES:  is allergic to penicillins.  MEDICATIONS:  Current Outpatient Prescriptions  Medication Sig Dispense Refill  . amLODipine (NORVASC) 5 MG tablet Take 5 mg by mouth daily.    . cholecalciferol (VITAMIN D) 1000 UNITS tablet Take 1,000 Units by mouth daily.    . clindamycin (CLINDAGEL) 1 % gel     . Clobetasol Propionate 0.05 % shampoo     . erlotinib (TARCEVA) 150 MG tablet Take 1 tablet (150 mg total) by mouth daily. Take on an empty stomach 1 hour before meals or 2 hours after. 90 tablet 0  . fluocinonide (LIDEX) 0.05 % external solution     . hydrocortisone 2.5 % cream     . Multiple Minerals-Vitamins (CALCIUM & VIT D3 BONE HEALTH PO) Take 750 mg by mouth 2 (two) times daily.    . potassium chloride (K-DUR) 10 MEQ tablet Take 1 tablet by mouth daily.    . valsartan-hydrochlorothiazide (DIOVAN-HCT) 160-25 MG per tablet Take 1 tablet by mouth daily.     No current facility-administered medications for this visit.    SURGICAL HISTORY:  Past Surgical History  Procedure Laterality Date  . Soft tissue biopsy  06/19/12    L 5th rib=metastatic adenocarcinoma  . Ovarian cyst removal    . Appendectomy      REVIEW OF SYSTEMS:  Constitutional: negative Eyes: negative Ears, nose, mouth, throat, and face: negative Respiratory: negative Cardiovascular: negative Gastrointestinal: negative Genitourinary:negative Integument/breast: positive for dryness and rash Hematologic/lymphatic: negative Musculoskeletal:negative Neurological: negative Behavioral/Psych:  negative Endocrine: negative Allergic/Immunologic: negative   PHYSICAL EXAMINATION: General appearance: alert, cooperative and no distress Head: Normocephalic, without obvious abnormality, atraumatic Neck: no adenopathy, no JVD, supple, symmetrical, trachea midline and thyroid not enlarged, symmetric, no tenderness/mass/nodules Lymph nodes: Cervical, supraclavicular, and axillary nodes normal. Resp: clear to auscultation bilaterally Back: symmetric, no curvature. ROM normal. No CVA tenderness. Cardio: regular rate and rhythm, S1, S2 normal, no murmur, click, rub or gallop GI: soft, non-tender; bowel sounds normal; no masses,  no organomegaly Extremities: extremities normal, atraumatic, no cyanosis or edema Neurologic: Alert and oriented X 3, normal strength and tone. Normal symmetric reflexes. Normal coordination and gait  ECOG PERFORMANCE STATUS: 1 - Symptomatic but completely ambulatory  Blood pressure 135/47, pulse 65, temperature 98.7 F (37.1 C), temperature source Oral, resp. rate 18, weight 163 lb 6.4 oz (74.118 kg), SpO2 99 %.  LABORATORY DATA: Lab Results  Component Value Date   WBC 7.1 06/22/2015   HGB 10.9* 06/22/2015   HCT 32.9* 06/22/2015   MCV 88.0 06/22/2015   PLT 244 06/22/2015      Chemistry      Component Value Date/Time   NA 137 06/22/2015 1503   NA 136 09/23/2012 1525   K 3.8 06/22/2015 1503   K 4.0 09/23/2012 1525   CL 102 05/06/2013 1511   CL 101 09/23/2012 1525   CO2 29 06/22/2015 1503   CO2 26 09/23/2012 1525   BUN 25.9 06/22/2015 1503   BUN 30* 09/23/2012 1525   CREATININE 1.0 06/22/2015 1503   CREATININE 0.84 09/23/2012 1525      Component Value Date/Time   CALCIUM 9.3 06/22/2015 1503   CALCIUM 8.9 09/23/2012 1525   ALKPHOS 59 06/22/2015 1503   ALKPHOS 52 09/23/2012 1525   AST 22 06/22/2015 1503   AST 24 09/23/2012 1525   ALT 19 06/22/2015 1503   ALT 31 09/23/2012 1525   BILITOT 0.62 06/22/2015 1503   BILITOT 0.8 09/23/2012 1525        RADIOGRAPHIC STUDIES: Ct Chest W Contrast  06/22/2015   CLINICAL DATA:  Lung cancer. Metastatic disease to bone. Oral chemotherapy ongoing.  EXAM: CT CHEST, ABDOMEN, AND PELVIS WITH CONTRAST  TECHNIQUE: Multidetector CT imaging of the chest, abdomen and pelvis was performed following the standard protocol during bolus administration of intravenous contrast.  CONTRAST:  163m OMNIPAQUE IOHEXOL 300 MG/ML  SOLN  COMPARISON:  02/09/2015  FINDINGS: CT CHEST FINDINGS  Mediastinum/Nodes: Right hilar node short axis diameter 1.2 cm on image 17 series 2, formerly 1.1 cm.  Borderline cardiomegaly.  Lungs/Pleura: Trace left pleural effusion. Diffuse airspace opacity in the superior segment left lower lobe similar to prior. Peripheral nodularity in the upper lobes including a indistinct 0.7 by 0.5 cm right upper lobe nodule on image 11 of series 4 which is stable. Faint additional scattered sub solid nodularity in the lungs similar to prior. 5 mm nodule in the right middle lobe along the minor fissure, image 24 series 4, stable. Increased atelectasis and generalized opacity in the posterior basal segment left lower lobe, images 34-37 of series 4, obscuring  the prior bandlike density in this vicinity.  Musculoskeletal: Expansile metastatic lesion of the left fourth rib laterally, no change from prior.  Trabecular accentuation at C7 and in multiple thoracic vertebra could be from hemangiomatosis, less likely to be due to malignancy.  CT ABDOMEN PELVIS FINDINGS  Hepatobiliary: 4 mm hypodense right hepatic lobe lesion, image 43 series 2, stable.  Pancreas: Unremarkable  Spleen: Unremarkable  Adrenals/Urinary Tract: Left kidney lower pole cyst. Adrenal glands normal.  Stomach/Bowel: Prominent stool throughout the colon favors constipation.  Vascular/Lymphatic: Aortoiliac atherosclerotic vascular disease.  Reproductive: 2.4 cm calcified uterine fibroid.  Other: No supplemental non-categorized findings.  Musculoskeletal:  Bilateral chronic pars defects at L5-S1 with grade 2 anterolisthesis and moderate bilateral foraminal stenosis at L5-S1. Prominence of stool in the rectum.  IMPRESSION: 1. Increase in total volume of airspace opacity in the superior segment left lower lobe favoring radiation pneumonitis. I do not see a definite expanding underlying tumor, although the increase in size needs to be carefully monitored. 2. Scattered faint sub solid nodularity in the lungs likewise merits observation. Several small solid nodules appear stable. 3. Unchanged appearance of the expansile metastatic lesion of the left fourth rib laterally. 4. 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 5.  Prominent stool throughout the colon favors constipation. 6.  Aortoiliac atherosclerotic vascular disease. 7. Uterine fibroid. 8. Bilateral foraminal stenosis at L5-S1 due to chronic bilateral pars defects and grade 2 anterolisthesis. 9. Mildly enlarged right hilar lymph node, 1.2 cm. 10. Borderline cardiomegaly.   Electronically Signed   By: Van Clines M.D.   On: 06/22/2015 16:28   Ct Abdomen Pelvis W Contrast  06/22/2015   CLINICAL DATA:  Lung cancer. Metastatic disease to bone. Oral chemotherapy ongoing.  EXAM: CT CHEST, ABDOMEN, AND PELVIS WITH CONTRAST  TECHNIQUE: Multidetector CT imaging of the chest, abdomen and pelvis was performed following the standard protocol during bolus administration of intravenous contrast.  CONTRAST:  146m OMNIPAQUE IOHEXOL 300 MG/ML  SOLN  COMPARISON:  02/09/2015  FINDINGS: CT CHEST FINDINGS  Mediastinum/Nodes: Right hilar node short axis diameter 1.2 cm on image 17 series 2, formerly 1.1 cm.  Borderline cardiomegaly.  Lungs/Pleura: Trace left pleural effusion. Diffuse airspace opacity in the superior segment left lower lobe similar to prior. Peripheral nodularity in the upper lobes including a indistinct 0.7 by 0.5 cm right upper lobe nodule on image 11 of series 4 which is stable. Faint additional scattered  sub solid nodularity in the lungs similar to prior. 5 mm nodule in the right middle lobe along the minor fissure, image 24 series 4, stable. Increased atelectasis and generalized opacity in the posterior basal segment left lower lobe, images 34-37 of series 4, obscuring the prior bandlike density in this vicinity.  Musculoskeletal: Expansile metastatic lesion of the left fourth rib laterally, no change from prior.  Trabecular accentuation at C7 and in multiple thoracic vertebra could be from hemangiomatosis, less likely to be due to malignancy.  CT ABDOMEN PELVIS FINDINGS  Hepatobiliary: 4 mm hypodense right hepatic lobe lesion, image 43 series 2, stable.  Pancreas: Unremarkable  Spleen: Unremarkable  Adrenals/Urinary Tract: Left kidney lower pole cyst. Adrenal glands normal.  Stomach/Bowel: Prominent stool throughout the colon favors constipation.  Vascular/Lymphatic: Aortoiliac atherosclerotic vascular disease.  Reproductive: 2.4 cm calcified uterine fibroid.  Other: No supplemental non-categorized findings.  Musculoskeletal: Bilateral chronic pars defects at L5-S1 with grade 2 anterolisthesis and moderate bilateral foraminal stenosis at L5-S1. Prominence of stool in the rectum.  IMPRESSION: 1. Increase in total volume  of airspace opacity in the superior segment left lower lobe favoring radiation pneumonitis. I do not see a definite expanding underlying tumor, although the increase in size needs to be carefully monitored. 2. Scattered faint sub solid nodularity in the lungs likewise merits observation. Several small solid nodules appear stable. 3. Unchanged appearance of the expansile metastatic lesion of the left fourth rib laterally. 4. 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 5.  Prominent stool throughout the colon favors constipation. 6.  Aortoiliac atherosclerotic vascular disease. 7. Uterine fibroid. 8. Bilateral foraminal stenosis at L5-S1 due to chronic bilateral pars defects and grade 2  anterolisthesis. 9. Mildly enlarged right hilar lymph node, 1.2 cm. 10. Borderline cardiomegaly.   Electronically Signed   By: Van Clines M.D.   On: 06/22/2015 16:28   ASSESSMENT AND PLAN: This is a very pleasant 69 years old Hispanic female with metastatic non-small cell lung cancer, adenocarcinoma currently on treatment with Tarceva 150 mg by mouth daily status post 30 months of treatment and tolerating it fairly well. The patient has no significant complaints today except dry skin as well as a skin rash. The recent CT scan of the Chest, Abdomen and pelvis showed no evidence for disease progression. I discussed the scan results with the patient and her husband. I recommended for the patient to continue her treatment with Tarceva with the same dose. For the skin rash, she will continue on clindamycin and hydrocortisone lotion. For the metastatic bone disease, she will continue on Xgeva as previously scheduled.  For the iron deficiency, the patient will continue on over the counter ferrous sulfate. She would come back for followup visit in 2 months after repeating CBC and comprehensive metabolic panel. She was advised to call immediately if she has any concerning symptoms in the interval.  The patient voices understanding of current disease status and treatment options and is in agreement with the current care plan.  All questions were answered. The patient knows to call the clinic with any problems, questions or concerns. We can certainly see the patient much sooner if necessary.  Disclaimer: This note was dictated with voice recognition software. Similar sounding words can inadvertently be transcribed and may not be corrected upon review.

## 2015-06-29 NOTE — Telephone Encounter (Signed)
Pt confirmed labs/ov per 08/02 POF, gave pt avs and calendar... KJ

## 2015-08-31 ENCOUNTER — Other Ambulatory Visit: Payer: 59

## 2015-09-01 ENCOUNTER — Ambulatory Visit (HOSPITAL_BASED_OUTPATIENT_CLINIC_OR_DEPARTMENT_OTHER): Payer: 59 | Admitting: Internal Medicine

## 2015-09-01 ENCOUNTER — Telehealth: Payer: Self-pay | Admitting: Internal Medicine

## 2015-09-01 ENCOUNTER — Other Ambulatory Visit (HOSPITAL_BASED_OUTPATIENT_CLINIC_OR_DEPARTMENT_OTHER): Payer: 59

## 2015-09-01 ENCOUNTER — Encounter: Payer: Self-pay | Admitting: Internal Medicine

## 2015-09-01 VITALS — BP 143/49 | HR 68 | Temp 98.3°F | Resp 18 | Ht 62.0 in | Wt 164.6 lb

## 2015-09-01 DIAGNOSIS — C3432 Malignant neoplasm of lower lobe, left bronchus or lung: Secondary | ICD-10-CM

## 2015-09-01 DIAGNOSIS — C7800 Secondary malignant neoplasm of unspecified lung: Secondary | ICD-10-CM

## 2015-09-01 DIAGNOSIS — C3431 Malignant neoplasm of lower lobe, right bronchus or lung: Secondary | ICD-10-CM | POA: Diagnosis not present

## 2015-09-01 DIAGNOSIS — C7952 Secondary malignant neoplasm of bone marrow: Secondary | ICD-10-CM

## 2015-09-01 DIAGNOSIS — C799 Secondary malignant neoplasm of unspecified site: Secondary | ICD-10-CM

## 2015-09-01 DIAGNOSIS — R21 Rash and other nonspecific skin eruption: Secondary | ICD-10-CM

## 2015-09-01 DIAGNOSIS — E611 Iron deficiency: Secondary | ICD-10-CM

## 2015-09-01 DIAGNOSIS — C7951 Secondary malignant neoplasm of bone: Secondary | ICD-10-CM | POA: Diagnosis not present

## 2015-09-01 DIAGNOSIS — D630 Anemia in neoplastic disease: Secondary | ICD-10-CM

## 2015-09-01 LAB — CBC WITH DIFFERENTIAL/PLATELET
BASO%: 0.4 % (ref 0.0–2.0)
Basophils Absolute: 0 10*3/uL (ref 0.0–0.1)
EOS%: 2.6 % (ref 0.0–7.0)
Eosinophils Absolute: 0.2 10*3/uL (ref 0.0–0.5)
HEMATOCRIT: 31.7 % — AB (ref 34.8–46.6)
HGB: 10.6 g/dL — ABNORMAL LOW (ref 11.6–15.9)
LYMPH#: 0.9 10*3/uL (ref 0.9–3.3)
LYMPH%: 14.7 % (ref 14.0–49.7)
MCH: 29.2 pg (ref 25.1–34.0)
MCHC: 33.4 g/dL (ref 31.5–36.0)
MCV: 87.4 fL (ref 79.5–101.0)
MONO#: 0.5 10*3/uL (ref 0.1–0.9)
MONO%: 8.5 % (ref 0.0–14.0)
NEUT%: 73.8 % (ref 38.4–76.8)
NEUTROS ABS: 4.7 10*3/uL (ref 1.5–6.5)
PLATELETS: 243 10*3/uL (ref 145–400)
RBC: 3.63 10*6/uL — ABNORMAL LOW (ref 3.70–5.45)
RDW: 13.6 % (ref 11.2–14.5)
WBC: 6.4 10*3/uL (ref 3.9–10.3)

## 2015-09-01 LAB — COMPREHENSIVE METABOLIC PANEL (CC13)
ALBUMIN: 3.4 g/dL — AB (ref 3.5–5.0)
ALK PHOS: 55 U/L (ref 40–150)
ALT: 19 U/L (ref 0–55)
AST: 19 U/L (ref 5–34)
Anion Gap: 6 mEq/L (ref 3–11)
BUN: 31.7 mg/dL — ABNORMAL HIGH (ref 7.0–26.0)
CO2: 28 mEq/L (ref 22–29)
CREATININE: 0.8 mg/dL (ref 0.6–1.1)
Calcium: 9.3 mg/dL (ref 8.4–10.4)
Chloride: 105 mEq/L (ref 98–109)
EGFR: 70 mL/min/{1.73_m2} — AB (ref >90)
Glucose: 110 mg/dl (ref 70–140)
Potassium: 3.8 mEq/L (ref 3.5–5.1)
Sodium: 139 mEq/L (ref 136–145)
Total Bilirubin: 0.57 mg/dL (ref 0.20–1.20)
Total Protein: 7.1 g/dL (ref 6.4–8.3)

## 2015-09-01 NOTE — Progress Notes (Signed)
[place] [phone]   [phone]  OFFICE PROGRESS NOTE  CLOWARD,DAVIS L, MD [address] [place] [address]  DIAGNOSIS: Metastatic non-small cell lung cancer, adenocarcinoma with positive EGFR mutation in exon 19 and negative ALK gene translocation diagnosed in July 2013 with bone metastasis.   PRIOR THERAPY:  1) Status post radiotherapy to the left face rib metastasis under the care of Dr. Lisbeth Renshaw.  2) stereotactic radiotherapy to the enlarging left lower lobe lung nodule under the care of Dr. Lisbeth Renshaw.  CURRENT THERAPY:  1) treatment with Tarceva 150 mg by mouth daily, therapy beginning 07/20/2012. Status post approximately 32 months of therapy.  2) Xgeva 120 mg subcutaneously every 4 weeks for bone disease.   CHEMOTHERAPY INTENT: Palliative  CURRENT # OF CHEMOTHERAPY CYCLES: 33 CURRENT ANTIEMETICS: Compazine  CURRENT SMOKING STATUS: Never smoker  ORAL CHEMOTHERAPY AND CONSENT: Tarceva  CURRENT BISPHOSPHONATES USE: Xgeva  PAIN MANAGEMENT: None  NARCOTICS INDUCED CONSTIPATION: None  LIVING WILL AND CODE STATUS: Full code  INTERVAL HISTORY: Jacqueline Leonard 69 y.o. female returns to the clinic today for monthly followup visit.The patient has no complaints today. She is currently on treatment with Tarceva 150 mg by mouth daily and tolerating it fairly well with no significant adverse effects. She denied having any significant skin rash ordiarrhea. She has no fever or chills, no nausea or vomiting. She has no weight loss or night sweats. The patient denied having any significant chest pain, shortness of breath, cough or hemoptysis.   MEDICAL HISTORY: Past Medical History  Diagnosis Date  . Lung mass   . Hypertension   . Hyperlipidemia   . Vitamin D deficiency   . Radiation 07/11/12-07/24/12    Palliative lung tx 30 gray in 10 fx  . Status post chemoradiation     Tarceva  . External hemorrhoids   . Allergy   . Anxiety   . Cancer,  metastatic to bone 06/19/12    bx=L 5th ribmetastatic Adenocarcinoma with known lung mass  . S/P radiation therapy 07/22/14-07/31/14    left lung/60gy/60f    ALLERGIES:  is allergic to penicillins.  MEDICATIONS:  Current Outpatient Prescriptions  Medication Sig Dispense Refill  . amLODipine (NORVASC) 5 MG tablet Take 5 mg by mouth daily.    . cholecalciferol (VITAMIN D) 1000 UNITS tablet Take 1,000 Units by mouth daily.    . clindamycin (CLINDAGEL) 1 % gel     . Clobetasol Propionate 0.05 % shampoo     . erlotinib (TARCEVA) 150 MG tablet Take 1 tablet (150 mg total) by mouth daily. Take on an empty stomach 1 hour before meals or 2 hours after. 90 tablet 0  . fluocinonide (LIDEX) 0.05 % external solution     . hydrocortisone 2.5 % cream     . Multiple Minerals-Vitamins (CALCIUM & VIT D3 BONE HEALTH PO) Take 750 mg by mouth 2 (two) times daily.    . potassium chloride (K-DUR) 10 MEQ tablet Take 1 tablet by mouth daily.    . valsartan-hydrochlorothiazide (DIOVAN-HCT) 160-25 MG per tablet Take 1 tablet by mouth daily.     No current facility-administered medications for this visit.    SURGICAL HISTORY:  Past Surgical History  Procedure Laterality Date  . Soft tissue biopsy  06/19/12    L 5th rib=metastatic adenocarcinoma  . Ovarian cyst removal    . Appendectomy      REVIEW OF SYSTEMS:  A comprehensive review of systems was negative.  PHYSICAL EXAMINATION: General appearance: alert, cooperative and no distress Head: Normocephalic, without obvious abnormality, atraumatic Neck: no adenopathy, no JVD, supple, symmetrical, trachea midline and thyroid not enlarged, symmetric, no tenderness/mass/nodules Lymph nodes: Cervical, supraclavicular, and axillary nodes normal. Resp: clear to auscultation bilaterally Back: symmetric, no curvature. ROM normal. No CVA tenderness. Cardio: regular rate and rhythm, S1, S2 normal, no murmur, click, rub or gallop GI: soft, non-tender; bowel sounds  normal; no masses,  no organomegaly Extremities: extremities normal, atraumatic, no cyanosis or edema Neurologic: Alert and oriented X 3, normal strength and tone. Normal symmetric reflexes. Normal coordination and gait  ECOG PERFORMANCE STATUS: 0 - Asymptomatic  Blood pressure 143/49, pulse 68, temperature 98.3 F (36.8 C), temperature source Oral, resp. rate 18, height 5' 2" (1.575 m), weight 164 lb 9.6 oz (74.662 kg), SpO2 98 %.  LABORATORY DATA: Lab Results  Component Value Date   WBC 6.4 09/01/2015   HGB 10.6* 09/01/2015   HCT 31.7* 09/01/2015   MCV 87.4 09/01/2015   PLT 243 09/01/2015      Chemistry      Component Value Date/Time   NA 139 09/01/2015 1456   NA 136 09/23/2012 1525   K 3.8 09/01/2015 1456   K 4.0 09/23/2012 1525   CL 102 05/06/2013 1511   CL 101 09/23/2012 1525   CO2 28 09/01/2015 1456   CO2 26 09/23/2012 1525   BUN 31.7* 09/01/2015 1456   BUN 30* 09/23/2012 1525   CREATININE 0.8 09/01/2015 1456   CREATININE 0.84 09/23/2012 1525      Component Value Date/Time   CALCIUM 9.3 09/01/2015 1456   CALCIUM 8.9 09/23/2012 1525   ALKPHOS 55 09/01/2015 1456   ALKPHOS 52 09/23/2012 1525   AST 19 09/01/2015 1456   AST 24 09/23/2012 1525   ALT 19 09/01/2015 1456   ALT 31 09/23/2012 1525   BILITOT 0.57 09/01/2015 1456   BILITOT 0.8 09/23/2012 1525       RADIOGRAPHIC STUDIES: No results found. ASSESSMENT AND PLAN: This is a very pleasant 69 years old Hispanic female with metastatic non-small cell lung cancer, adenocarcinoma currently on treatment with Tarceva 150 mg by mouth daily status post 32 months of treatment and tolerating it fairly well. The patient has no significant complaints today. I recommended for the patient to continue her treatment with Tarceva with the same dose. For the skin rash, she will continue on clindamycin and hydrocortisone lotion. For the metastatic bone disease, she will continue on Xgeva as previously scheduled.  For the iron  deficiency, the patient will continue on over the counter ferrous sulfate. She would come back for followup visit in 2 months after repeating CBC and comprehensive metabolic panel and repeat CT scan of the chest, abdomen and pelvis. She was advised to call immediately if she has any concerning symptoms in the interval. The patient voices understanding of current disease status and treatment options and is in agreement with the current care plan.  All questions were answered. The patient knows to call the clinic with any problems, questions or concerns. We can certainly see the patient much sooner if necessary.  Disclaimer: This note was dictated with voice recognition software. Similar sounding words can inadvertently be transcribed and may not be corrected upon review.

## 2015-09-01 NOTE — Telephone Encounter (Signed)
Gave patient avs report and appointments. [facility] will call patient with ct scan - patient aware.

## 2015-09-02 ENCOUNTER — Other Ambulatory Visit: Payer: Self-pay | Admitting: Medical Oncology

## 2015-09-02 DIAGNOSIS — C799 Secondary malignant neoplasm of unspecified site: Secondary | ICD-10-CM

## 2015-09-02 MED ORDER — ERLOTINIB HCL 150 MG PO TABS: 150.0000 mg | ORAL_TABLET | Freq: Every day | ORAL | Status: DC

## 2015-09-07 ENCOUNTER — Ambulatory Visit: Payer: 59 | Admitting: Internal Medicine

## 2015-09-09 ENCOUNTER — Encounter: Payer: Self-pay | Admitting: Radiation Oncology

## 2015-09-09 ENCOUNTER — Telehealth: Payer: Self-pay | Admitting: *Deleted

## 2015-09-09 ENCOUNTER — Ambulatory Visit
Admission: RE | Admit: 2015-09-09 | Discharge: 2015-09-09 | Disposition: A | Discharge status: Home / Self Care | Payer: 59 | Source: Ambulatory Visit | Attending: Radiation Oncology | Admitting: Radiation Oncology

## 2015-09-09 VITALS — BP 138/48 | HR 70 | Temp 98.2°F | Ht 62.0 in | Wt 164.1 lb

## 2015-09-09 DIAGNOSIS — C7802 Secondary malignant neoplasm of left lung: Secondary | ICD-10-CM

## 2015-09-09 DIAGNOSIS — C799 Secondary malignant neoplasm of unspecified site: Secondary | ICD-10-CM

## 2015-09-09 MED ORDER — ERLOTINIB HCL 150 MG PO TABS: 150.0000 mg | ORAL_TABLET | Freq: Every day | ORAL | Status: DC

## 2015-09-09 NOTE — Progress Notes (Signed)
  Radiation Oncology         [phone] ________________________________  Name: Jacqueline Leonard MRN: [id number]  Date: 09/09/2015  DOB: 1946/07/14  Follow-Up Visit Note  CC: [name], MD  Curt Bears, MD  Diagnosis:      ICD-9-CM ICD-10-CM   1. Malignant neoplasm metastatic to left lung ([facility]) 197.0 C78.02     Interval Since Last Radiation:   The patient completed radiation treatment to the left lungcorresponding to a course of stereotactic body radiation treatment in September 2015  Narrative:  The patient returns today for routine follow-up.  The patient clinically has been stable overall since the patient was last seen in terms of respiratory symptoms. No significant discomfort or pain in the chest area. The patient has continued with Tarceva chemotherapy which has worked very well for the patient. She states that she is tolerating this well. She had a good report after discussing her last CT scans in July No changes have been made in recent months.  ALLERGIES:  is allergic to penicillins.  Meds: Current Outpatient Prescriptions  Medication Sig Dispense Refill  . amLODipine (NORVASC) 5 MG tablet Take 5 mg by mouth daily.    . cholecalciferol (VITAMIN D) 1000 UNITS tablet Take 1,000 Units by mouth daily.    . clindamycin (CLINDAGEL) 1 % gel     . erlotinib (TARCEVA) 150 MG tablet Take 1 tablet (150 mg total) by mouth daily. Take on an empty stomach 1 hour before meals or 2 hours after. 90 tablet 0  . hydrocortisone 2.5 % cream     . Multiple Minerals-Vitamins (CALCIUM & VIT D3 BONE HEALTH PO) Take 750 mg by mouth 2 (two) times daily.    . potassium chloride (K-DUR) 10 MEQ tablet Take 1 tablet by mouth daily.    . valsartan-hydrochlorothiazide (DIOVAN-HCT) 160-25 MG per tablet Take 1 tablet by mouth daily.    . Clobetasol Propionate 0.05 % shampoo     . fluocinonide (LIDEX) 0.05 % external solution      No current facility-administered medications for this encounter.      Physical Findings: The patient is in no acute distress. Patient is alert and oriented.  height is '5\' 2"'$  (1.575 m) and weight is 164 lb 1.6 oz (74.435 kg). Her temperature is 98.2 F (36.8 C). Her blood pressure is 138/48 and her pulse is 70. Her oxygen saturation is 98%. .   Alert, in no acute distress Clear to auscultation bilaterally Regular rate and rhythm Abdomen is soft nontender normal bowel sounds No extremity edema present  Lab Findings: Lab Results  Component Value Date   WBC 6.4 09/01/2015   HGB 10.6* 09/01/2015   HCT 31.7* 09/01/2015   MCV 87.4 09/01/2015   PLT 243 09/01/2015     Radiographic Findings: No results found.  Impression:    The patient clinically is doing satisfactorily. I have personally reviewed the patient's recent CT scan of the chest completed in July. Radiation effect was seen in the left lung but no thickening concerns for progression/recurrence at this time. We will continue ongoing routine followup. She will continue management with Dr. Ilene Qua has been doing very well on Tarceva chemotherapy  Plan:  The patient will return to clinic in 6 months for ongoing follow-up.  I spent 10 minutes with the patient today, the majority of which was spent counseling the patient on the diagnosis of cancer and coordinating care.   [name], M.D., Ph.D.

## 2015-09-09 NOTE — Telephone Encounter (Signed)
Faxed refill request from [facility] for Tarceva 150 mg.  Per records this order was faxed on 09-02-2015.  Will send eRx today as ordered by Dr. Julien Nordmann on 09-02-2015.

## 2015-09-09 NOTE — Progress Notes (Signed)
Jacqueline Leonard is here for follow up of radiation to her lung, finishing 07/31/14. She reports that she is feeling well, she denies pain, and does report some mild fatigue at times. She states "I am only reminded that I am sick when I come here". Her skin is intact without any problems.  BP 138/48 mmHg  Pulse 70  Temp(Src) 98.2 F (36.8 C)  Ht '5\' 2"'$  (1.575 m)  Wt 164 lb 1.6 oz (74.435 kg)  BMI 30.01 kg/m2  SpO2 98%   Wt Readings from Last 3 Encounters:  09/09/15 164 lb 1.6 oz (74.435 kg)  09/01/15 164 lb 9.6 oz (74.662 kg)  06/29/15 163 lb 6.4 oz (74.118 kg)

## 2015-09-17 ENCOUNTER — Telehealth: Payer: Self-pay | Admitting: *Deleted

## 2015-09-17 NOTE — Telephone Encounter (Signed)
[facility] called. Did not have signature on tarceva. Verbal given

## 2015-10-27 ENCOUNTER — Other Ambulatory Visit (HOSPITAL_BASED_OUTPATIENT_CLINIC_OR_DEPARTMENT_OTHER): Payer: 59

## 2015-10-27 ENCOUNTER — Ambulatory Visit (HOSPITAL_COMMUNITY)
Admission: RE | Admit: 2015-10-27 | Discharge: 2015-10-27 | Disposition: A | Discharge status: Home / Self Care | Payer: 59 | Source: Ambulatory Visit | Attending: Internal Medicine | Admitting: Internal Medicine

## 2015-10-27 DIAGNOSIS — J90 Pleural effusion, not elsewhere classified: Secondary | ICD-10-CM | POA: Insufficient documentation

## 2015-10-27 DIAGNOSIS — C3432 Malignant neoplasm of lower lobe, left bronchus or lung: Secondary | ICD-10-CM | POA: Diagnosis not present

## 2015-10-27 DIAGNOSIS — M899 Disorder of bone, unspecified: Secondary | ICD-10-CM | POA: Insufficient documentation

## 2015-10-27 DIAGNOSIS — C7951 Secondary malignant neoplasm of bone: Secondary | ICD-10-CM

## 2015-10-27 DIAGNOSIS — C7800 Secondary malignant neoplasm of unspecified lung: Secondary | ICD-10-CM | POA: Insufficient documentation

## 2015-10-27 DIAGNOSIS — C799 Secondary malignant neoplasm of unspecified site: Secondary | ICD-10-CM | POA: Diagnosis present

## 2015-10-27 DIAGNOSIS — Z923 Personal history of irradiation: Secondary | ICD-10-CM | POA: Insufficient documentation

## 2015-10-27 DIAGNOSIS — E611 Iron deficiency: Secondary | ICD-10-CM | POA: Diagnosis not present

## 2015-10-27 LAB — CBC WITH DIFFERENTIAL/PLATELET
BASO%: 0.1 % (ref 0.0–2.0)
Basophils Absolute: 0 10*3/uL (ref 0.0–0.1)
EOS%: 2.4 % (ref 0.0–7.0)
Eosinophils Absolute: 0.2 10*3/uL (ref 0.0–0.5)
HCT: 31.9 % — ABNORMAL LOW (ref 34.8–46.6)
HGB: 10.6 g/dL — ABNORMAL LOW (ref 11.6–15.9)
LYMPH%: 11.5 % — AB (ref 14.0–49.7)
MCH: 29.4 pg (ref 25.1–34.0)
MCHC: 33.2 g/dL (ref 31.5–36.0)
MCV: 88.6 fL (ref 79.5–101.0)
MONO#: 0.9 10*3/uL (ref 0.1–0.9)
MONO%: 8.6 % (ref 0.0–14.0)
NEUT%: 77.4 % — ABNORMAL HIGH (ref 38.4–76.8)
NEUTROS ABS: 7.7 10*3/uL — AB (ref 1.5–6.5)
Platelets: 249 10*3/uL (ref 145–400)
RBC: 3.6 10*6/uL — AB (ref 3.70–5.45)
RDW: 13.3 % (ref 11.2–14.5)
WBC: 9.9 10*3/uL (ref 3.9–10.3)
lymph#: 1.1 10*3/uL (ref 0.9–3.3)

## 2015-10-27 LAB — COMPREHENSIVE METABOLIC PANEL (CC13)
ALT: 24 U/L (ref 0–55)
AST: 26 U/L (ref 5–34)
Albumin: 3.2 g/dL — ABNORMAL LOW (ref 3.5–5.0)
Alkaline Phosphatase: 66 U/L (ref 40–150)
Anion Gap: 7 mEq/L (ref 3–11)
BILIRUBIN TOTAL: 0.85 mg/dL (ref 0.20–1.20)
BUN: 22.9 mg/dL (ref 7.0–26.0)
CO2: 29 meq/L (ref 22–29)
Calcium: 9.6 mg/dL (ref 8.4–10.4)
Chloride: 101 mEq/L (ref 98–109)
Creatinine: 1.1 mg/dL (ref 0.6–1.1)
EGFR: 49 mL/min/{1.73_m2} — ABNORMAL LOW (ref >90)
GLUCOSE: 88 mg/dL (ref 70–140)
Potassium: 4.2 mEq/L (ref 3.5–5.1)
SODIUM: 137 meq/L (ref 136–145)
Total Protein: 7.6 g/dL (ref 6.4–8.3)

## 2015-10-27 MED ORDER — IOHEXOL 300 MG/ML  SOLN
100.0000 mL | Freq: Once | INTRAMUSCULAR | Status: AC | PRN
  Administered 2015-10-27: 100 mL via INTRAVENOUS

## 2015-11-01 ENCOUNTER — Telehealth: Payer: Self-pay | Admitting: Internal Medicine

## 2015-11-01 ENCOUNTER — Ambulatory Visit (HOSPITAL_BASED_OUTPATIENT_CLINIC_OR_DEPARTMENT_OTHER): Payer: 59 | Admitting: Internal Medicine

## 2015-11-01 ENCOUNTER — Encounter: Payer: Self-pay | Admitting: Internal Medicine

## 2015-11-01 VITALS — BP 136/40 | HR 70 | Temp 98.2°F | Resp 18 | Ht 62.0 in | Wt 157.9 lb

## 2015-11-01 DIAGNOSIS — C3490 Malignant neoplasm of unspecified part of unspecified bronchus or lung: Secondary | ICD-10-CM

## 2015-11-01 DIAGNOSIS — E611 Iron deficiency: Secondary | ICD-10-CM | POA: Diagnosis not present

## 2015-11-01 DIAGNOSIS — C799 Secondary malignant neoplasm of unspecified site: Secondary | ICD-10-CM

## 2015-11-01 DIAGNOSIS — R21 Rash and other nonspecific skin eruption: Secondary | ICD-10-CM

## 2015-11-01 DIAGNOSIS — C7951 Secondary malignant neoplasm of bone: Secondary | ICD-10-CM | POA: Diagnosis not present

## 2015-11-01 NOTE — Telephone Encounter (Signed)
per pof to sch pt appt-gave pt copy of avs °

## 2015-11-01 NOTE — Progress Notes (Signed)
[facility] [phone]   [phone]  OFFICE PROGRESS NOTE  CLOWARD,DAVIS L, MD [address] [place] [address]  DIAGNOSIS: Metastatic non-small cell lung cancer, adenocarcinoma with positive EGFR mutation in exon 19 and negative ALK gene translocation diagnosed in July 2013 with bone metastasis.   PRIOR THERAPY:  1) Status post radiotherapy to the left face rib metastasis under the care of Dr. Lisbeth Renshaw.  2) stereotactic radiotherapy to the enlarging left lower lobe lung nodule under the care of Dr. Lisbeth Renshaw.  CURRENT THERAPY:  1) treatment with Tarceva 150 mg by mouth daily, therapy beginning 07/20/2012. Status post approximately 34 months of therapy.  2) Xgeva 120 mg subcutaneously every 4 weeks for bone disease.   CHEMOTHERAPY INTENT: Palliative  CURRENT # OF CHEMOTHERAPY CYCLES: 35 CURRENT ANTIEMETICS: Compazine  CURRENT SMOKING STATUS: Never smoker  ORAL CHEMOTHERAPY AND CONSENT: Tarceva  CURRENT BISPHOSPHONATES USE: Xgeva  PAIN MANAGEMENT: None  NARCOTICS INDUCED CONSTIPATION: None  LIVING WILL AND CODE STATUS: Full code  INTERVAL HISTORY: Jacqueline Leonard 69 y.o. female returns to the clinic today for monthly followup visit. The patient has no complaints today except for the dry skin and mild skin rash on the face and scalp. She was seen by her primary care physician and started on treatment with ketoconazole for the scalp in addition to doxycycline. Her skin rash is a little bit better and the patient has an appointment with the dermatologist for evaluation of the dry skin next week. She is currently on treatment with Tarceva 150 mg by mouth daily and tolerating it fairly well with no significant adverse effects except for the above symptoms. She denied having any significant diarrhea. She has no fever or chills, no nausea or vomiting. She has no weight loss or night sweats. The patient denied having any significant chest pain, shortness  of breath, cough or hemoptysis. She had repeat CT scan of the chest, abdomen and pelvis performed recently and she is here for evaluation and discussion of her scan results.  MEDICAL HISTORY: Past Medical History  Diagnosis Date  . Lung mass   . Hypertension   . Hyperlipidemia   . Vitamin D deficiency   . Radiation 07/11/12-07/24/12    Palliative lung tx 30 gray in 10 fx  . Status post chemoradiation     Tarceva  . External hemorrhoids   . Allergy   . Anxiety   . Cancer, metastatic to bone ([facility]) 06/19/12    bx=L 5th ribmetastatic Adenocarcinoma with known lung mass  . S/P radiation therapy 07/22/14-07/31/14    left lung/60gy/66f    ALLERGIES:  is allergic to penicillins.  MEDICATIONS:  Current Outpatient Prescriptions  Medication Sig Dispense Refill  . amLODipine (NORVASC) 5 MG tablet Take 5 mg by mouth daily.    . cholecalciferol (VITAMIN D) 1000 UNITS tablet Take 1,000 Units by mouth daily.    . clindamycin (CLINDAGEL) 1 % gel     . Clobetasol Propionate 0.05 % shampoo     . erlotinib (TARCEVA) 150 MG tablet Take 1 tablet (150 mg total) by mouth daily. Take on an empty stomach 1 hour before meals or 2 hours after. 90 tablet 0  . fluocinonide (LIDEX) 0.05 % external solution     . hydrocortisone 2.5 % cream     . Multiple Minerals-Vitamins (CALCIUM & VIT D3 BONE HEALTH PO) Take 750 mg by mouth 2 (two) times daily.    . potassium chloride (K-DUR) 10  MEQ tablet Take 1 tablet by mouth daily.    . valsartan-hydrochlorothiazide (DIOVAN-HCT) 160-25 MG per tablet Take 1 tablet by mouth daily.     No current facility-administered medications for this visit.    SURGICAL HISTORY:  Past Surgical History  Procedure Laterality Date  . Soft tissue biopsy  06/19/12    L 5th rib=metastatic adenocarcinoma  . Ovarian cyst removal    . Appendectomy      REVIEW OF SYSTEMS:  Constitutional: negative Eyes: negative Ears, nose, mouth, throat, and face: negative Respiratory:  negative Cardiovascular: negative Gastrointestinal: negative Genitourinary:negative Integument/breast: positive for dryness and rash Hematologic/lymphatic: negative Musculoskeletal:negative Neurological: negative Behavioral/Psych: negative Endocrine: negative Allergic/Immunologic: negative   PHYSICAL EXAMINATION: General appearance: alert, cooperative and no distress Head: Normocephalic, without obvious abnormality, atraumatic Neck: no adenopathy, no JVD, supple, symmetrical, trachea midline and thyroid not enlarged, symmetric, no tenderness/mass/nodules Lymph nodes: Cervical, supraclavicular, and axillary nodes normal. Resp: clear to auscultation bilaterally Back: symmetric, no curvature. ROM normal. No CVA tenderness. Cardio: regular rate and rhythm, S1, S2 normal, no murmur, click, rub or gallop GI: soft, non-tender; bowel sounds normal; no masses,  no organomegaly Extremities: extremities normal, atraumatic, no cyanosis or edema Neurologic: Alert and oriented X 3, normal strength and tone. Normal symmetric reflexes. Normal coordination and gait  ECOG PERFORMANCE STATUS: 1 - Symptomatic but completely ambulatory  Blood pressure 136/40, pulse 70, temperature 98.2 F (36.8 C), temperature source Oral, resp. rate 18, height _0  (1.575 m), weight 157 lb 14.4 oz (71.623 kg), SpO2 100 %.  LABORATORY DATA: Lab Results  Component Value Date   WBC 9.9 10/27/2015   HGB 10.6* 10/27/2015   HCT 31.9* 10/27/2015   MCV 88.6 10/27/2015   PLT 249 10/27/2015      Chemistry      Component Value Date/Time   NA 137 10/27/2015 1513   NA 136 09/23/2012 1525   K 4.2 10/27/2015 1513   K 4.0 09/23/2012 1525   CL 102 05/06/2013 1511   CL 101 09/23/2012 1525   CO2 29 10/27/2015 1513   CO2 26 09/23/2012 1525   BUN 22.9 10/27/2015 1513   BUN 30* 09/23/2012 1525   CREATININE 1.1 10/27/2015 1513   CREATININE 0.84 09/23/2012 1525      Component Value Date/Time   CALCIUM 9.6 10/27/2015 1513    CALCIUM 8.9 09/23/2012 1525   ALKPHOS 66 10/27/2015 1513   ALKPHOS 52 09/23/2012 1525   AST 26 10/27/2015 1513   AST 24 09/23/2012 1525   ALT 24 10/27/2015 1513   ALT 31 09/23/2012 1525   BILITOT 0.85 10/27/2015 1513   BILITOT 0.8 09/23/2012 1525       RADIOGRAPHIC STUDIES: Ct Chest W Contrast  10/27/2015  CLINICAL DATA:  69 year old female with history of metastatic adenocarcinoma of the left lung originally diagnosed in 2013 status post radiation therapy to the left lung in September 2015. Followup study. EXAM: CT CHEST, ABDOMEN, AND PELVIS WITH CONTRAST TECHNIQUE: Multidetector CT imaging of the chest, abdomen and pelvis was performed following the standard protocol during bolus administration of intravenous contrast. CONTRAST:  182m OMNIPAQUE IOHEXOL 300 MG/ML  SOLN COMPARISON:  None. FINDINGS: CT CHEST FINDINGS Mediastinum/Nodes: Heart size is mildly enlarged. There is no significant pericardial fluid, thickening or pericardial calcification. Mildly enlarged right hilar lymph node measuring 12 mm in short axis is unchanged. No other new mediastinal or left hilar lymphadenopathy is noted. Esophagus is normal in appearance. No axillary lymphadenopathy. Lungs/Pleura: Persistent mass-like opacity and chronic volume  loss in the left lower lobe, predominantly involving the superior segment, similar to prior studies, most compatible with postradiation mass-like fibrosis. Extensive areas of architectural distortion and septal thickening throughout the left lower lobe again noted, slightly more prominent than prior examinations, particularly in the basal segments (image 36 of series 4). There is also slight increased pleural thickening in the periphery of the left lung, best appreciated on image 23 of series 2, deep to the expansile lesion in the lateral aspect of the left rib. New small left pleural effusion appears likely partially loculated. No definite pleural based mass identified in the left  hemithorax at this time. Multiple other scattered areas of septal thickening and nodularity have dramatically increased in the lungs bilaterally, particularly throughout the right lung, concerning for developing lymphangitic spread of disease, although atypical infection could have a similar appearance. Musculoskeletal: Expansile mixed lytic and sclerotic lesion in the lateral aspect of the left fourth rib again noted, similar in appearance compared to the prior examination. No other new aggressive appearing lytic or blastic lesions are noted elsewhere in the visualized axial or appendicular skeleton. CT ABDOMEN PELVIS FINDINGS Hepatobiliary: Sub cm low-attenuation lesion in the central aspect of segment 7 of the liver is too small to definitively characterize, but is unchanged compared to prior studies, favored to represent a tiny cyst. No other suspicious appearing hepatic lesions are noted. No intra or extrahepatic biliary ductal dilatation. Gallbladder is normal in appearance. Pancreas: No pancreatic mass. No pancreatic ductal dilatation. No pancreatic or peripancreatic fluid or inflammatory changes. Spleen: Unremarkable. Adrenals/Urinary Tract: Bilateral adrenal glands are normal in appearance. 3.3 cm exophytic well-defined low-attenuation nonenhancing lesion in the lower pole of the left kidney is compatible with a simple cyst. Right kidney is normal in appearance. No hydroureteronephrosis. Urinary bladder is normal in appearance. Stomach/Bowel: Normal appearance of the stomach. No pathologic dilatation of small bowel or colon. The appendix is not visualized and is likely surgically absent. Regardless, no surrounding inflammatory changes are noted adjacent to the cecum to suggest the presence of an acute appendicitis at this time. Vascular/Lymphatic: Atherosclerosis throughout the abdominal and pelvic vasculature, without evidence of aneurysm or dissection. No lymphadenopathy noted in the abdomen or pelvis.  Reproductive: Densely calcified lesion in the uterus measuring 2.8 cm in diameter is presumably a calcified fibroid. Uterus is retroflexed, and otherwise atrophic. Ovaries are atrophic. Other: No significant volume of ascites.  No pneumoperitoneum. Musculoskeletal: Bilateral pars defects at L5. 8 mm of anterolisthesis of L5 upon S1. There are no aggressive appearing lytic or blastic lesions noted in the visualized portions of the skeleton. IMPRESSION: 1. While the area of mass-like fibrosis in the superior segment of the left lower lobe is very similar to the prior study, there are progressively worsening areas of septal thickening and nodularity throughout the lungs bilaterally. While some of these findings (particularly in the left lung) could be attributable to evolving postradiation changes, the overall appearance is concerning for progressively worsening multifocal disease with developing lymphangitic spread of tumor. Atypical infection could have a similar appearance, however, and clinical correlation is recommended. 2. In addition, there is a new small partially loculated left pleural effusion. This also could be treatment related, but the possibility of a developing malignant left pleural effusion is not excluded. This is too small for unguided thoracentesis, but may be a suitable target for image guided thoracentesis for fluid sampling if clinically appropriate. 3. Expansile mixed lytic and sclerotic lesion in the lateral aspect of the left fourth  rib is unchanged compared to prior examinations. No new osseous lesions are noted. 4. Additional incidental findings, as above, similar prior examinations. Electronically Signed   By: Vinnie Langton M.D.   On: 10/27/2015 16:56   Ct Abdomen Pelvis W Contrast  10/27/2015  CLINICAL DATA:  69 year old female with history of metastatic adenocarcinoma of the left lung originally diagnosed in 2013 status post radiation therapy to the left lung in September 2015.  Followup study. EXAM: CT CHEST, ABDOMEN, AND PELVIS WITH CONTRAST TECHNIQUE: Multidetector CT imaging of the chest, abdomen and pelvis was performed following the standard protocol during bolus administration of intravenous contrast. CONTRAST:  165m OMNIPAQUE IOHEXOL 300 MG/ML  SOLN COMPARISON:  None. FINDINGS: CT CHEST FINDINGS Mediastinum/Nodes: Heart size is mildly enlarged. There is no significant pericardial fluid, thickening or pericardial calcification. Mildly enlarged right hilar lymph node measuring 12 mm in short axis is unchanged. No other new mediastinal or left hilar lymphadenopathy is noted. Esophagus is normal in appearance. No axillary lymphadenopathy. Lungs/Pleura: Persistent mass-like opacity and chronic volume loss in the left lower lobe, predominantly involving the superior segment, similar to prior studies, most compatible with postradiation mass-like fibrosis. Extensive areas of architectural distortion and septal thickening throughout the left lower lobe again noted, slightly more prominent than prior examinations, particularly in the basal segments (image 36 of series 4). There is also slight increased pleural thickening in the periphery of the left lung, best appreciated on image 23 of series 2, deep to the expansile lesion in the lateral aspect of the left rib. New small left pleural effusion appears likely partially loculated. No definite pleural based mass identified in the left hemithorax at this time. Multiple other scattered areas of septal thickening and nodularity have dramatically increased in the lungs bilaterally, particularly throughout the right lung, concerning for developing lymphangitic spread of disease, although atypical infection could have a similar appearance. Musculoskeletal: Expansile mixed lytic and sclerotic lesion in the lateral aspect of the left fourth rib again noted, similar in appearance compared to the prior examination. No other new aggressive appearing  lytic or blastic lesions are noted elsewhere in the visualized axial or appendicular skeleton. CT ABDOMEN PELVIS FINDINGS Hepatobiliary: Sub cm low-attenuation lesion in the central aspect of segment 7 of the liver is too small to definitively characterize, but is unchanged compared to prior studies, favored to represent a tiny cyst. No other suspicious appearing hepatic lesions are noted. No intra or extrahepatic biliary ductal dilatation. Gallbladder is normal in appearance. Pancreas: No pancreatic mass. No pancreatic ductal dilatation. No pancreatic or peripancreatic fluid or inflammatory changes. Spleen: Unremarkable. Adrenals/Urinary Tract: Bilateral adrenal glands are normal in appearance. 3.3 cm exophytic well-defined low-attenuation nonenhancing lesion in the lower pole of the left kidney is compatible with a simple cyst. Right kidney is normal in appearance. No hydroureteronephrosis. Urinary bladder is normal in appearance. Stomach/Bowel: Normal appearance of the stomach. No pathologic dilatation of small bowel or colon. The appendix is not visualized and is likely surgically absent. Regardless, no surrounding inflammatory changes are noted adjacent to the cecum to suggest the presence of an acute appendicitis at this time. Vascular/Lymphatic: Atherosclerosis throughout the abdominal and pelvic vasculature, without evidence of aneurysm or dissection. No lymphadenopathy noted in the abdomen or pelvis. Reproductive: Densely calcified lesion in the uterus measuring 2.8 cm in diameter is presumably a calcified fibroid. Uterus is retroflexed, and otherwise atrophic. Ovaries are atrophic. Other: No significant volume of ascites.  No pneumoperitoneum. Musculoskeletal: Bilateral pars defects at L5. 8  mm of anterolisthesis of L5 upon S1. There are no aggressive appearing lytic or blastic lesions noted in the visualized portions of the skeleton. IMPRESSION: 1. While the area of mass-like fibrosis in the superior  segment of the left lower lobe is very similar to the prior study, there are progressively worsening areas of septal thickening and nodularity throughout the lungs bilaterally. While some of these findings (particularly in the left lung) could be attributable to evolving postradiation changes, the overall appearance is concerning for progressively worsening multifocal disease with developing lymphangitic spread of tumor. Atypical infection could have a similar appearance, however, and clinical correlation is recommended. 2. In addition, there is a new small partially loculated left pleural effusion. This also could be treatment related, but the possibility of a developing malignant left pleural effusion is not excluded. This is too small for unguided thoracentesis, but may be a suitable target for image guided thoracentesis for fluid sampling if clinically appropriate. 3. Expansile mixed lytic and sclerotic lesion in the lateral aspect of the left fourth rib is unchanged compared to prior examinations. No new osseous lesions are noted. 4. Additional incidental findings, as above, similar prior examinations. Electronically Signed   By: Vinnie Langton M.D.   On: 10/27/2015 16:56   ASSESSMENT AND PLAN: This is a very pleasant 69 years old Hispanic female with metastatic non-small cell lung cancer, adenocarcinoma currently on treatment with Tarceva 150 mg by mouth daily status post 34 months of treatment and tolerating it fairly well. The patient has no significant complaints today except for the skin rash and dry skin. The recent CT scan of the chest showed no evidence for disease progression except for progressive nodularity in the left lower lobe concerning for worsening radiation fibrosis that lymphangitic spread of the tumor could not be excluded. I discussed the scan results with the patient today. She is currently asymptomatic with no significant shortness breath or chest pain. I recommended for the  patient to continue her treatment with Tarceva with the same dose. For the skin rash, she will continue on clindamycin and hydrocortisone lotion. For the metastatic bone disease, she will continue on Xgeva as previously scheduled.  For the iron deficiency, the patient will continue on over the counter ferrous sulfate. She would come back for followup visit in 2 months after repeating CBC and comprehensive metabolic panel. She was advised to call immediately if she has any concerning symptoms in the interval. The patient voices understanding of current disease status and treatment options and is in agreement with the current care plan.  All questions were answered. The patient knows to call the clinic with any problems, questions or concerns. We can certainly see the patient much sooner if necessary.  Disclaimer: This note was dictated with voice recognition software. Similar sounding words can inadvertently be transcribed and may not be corrected upon review.

## 2015-12-08 ENCOUNTER — Ambulatory Visit (HOSPITAL_BASED_OUTPATIENT_CLINIC_OR_DEPARTMENT_OTHER): Payer: 59

## 2015-12-08 VITALS — BP 138/51 | HR 66 | Temp 97.6°F | Resp 18

## 2015-12-08 DIAGNOSIS — C799 Secondary malignant neoplasm of unspecified site: Secondary | ICD-10-CM

## 2015-12-08 DIAGNOSIS — C7951 Secondary malignant neoplasm of bone: Secondary | ICD-10-CM | POA: Diagnosis not present

## 2015-12-08 DIAGNOSIS — C7952 Secondary malignant neoplasm of bone marrow: Principal | ICD-10-CM

## 2015-12-08 DIAGNOSIS — C3490 Malignant neoplasm of unspecified part of unspecified bronchus or lung: Secondary | ICD-10-CM | POA: Diagnosis not present

## 2015-12-08 MED ORDER — DENOSUMAB 120 MG/1.7ML ~~LOC~~ SOLN
120.0000 mg | Freq: Once | SUBCUTANEOUS | Status: AC
  Administered 2015-12-08: 120 mg via SUBCUTANEOUS
  Filled 2015-12-08: qty 1.7

## 2015-12-29 ENCOUNTER — Encounter: Payer: Self-pay | Admitting: Internal Medicine

## 2015-12-29 NOTE — Progress Notes (Signed)
forms placed in box-[employer]

## 2015-12-30 ENCOUNTER — Encounter: Payer: Self-pay | Admitting: Internal Medicine

## 2015-12-30 NOTE — Progress Notes (Signed)
I placed for dr. Serena Croissant

## 2016-01-04 ENCOUNTER — Encounter: Payer: Self-pay | Admitting: Internal Medicine

## 2016-01-04 ENCOUNTER — Telehealth: Payer: Self-pay | Admitting: Internal Medicine

## 2016-01-04 ENCOUNTER — Ambulatory Visit (HOSPITAL_BASED_OUTPATIENT_CLINIC_OR_DEPARTMENT_OTHER): Payer: 59

## 2016-01-04 DIAGNOSIS — C799 Secondary malignant neoplasm of unspecified site: Secondary | ICD-10-CM

## 2016-01-04 DIAGNOSIS — C3490 Malignant neoplasm of unspecified part of unspecified bronchus or lung: Secondary | ICD-10-CM

## 2016-01-04 LAB — COMPREHENSIVE METABOLIC PANEL
ALT: 74 U/L — AB (ref 0–55)
ANION GAP: 10 meq/L (ref 3–11)
AST: 96 U/L — ABNORMAL HIGH (ref 5–34)
Albumin: 2.7 g/dL — ABNORMAL LOW (ref 3.5–5.0)
Alkaline Phosphatase: 221 U/L — ABNORMAL HIGH (ref 40–150)
BILIRUBIN TOTAL: 0.88 mg/dL (ref 0.20–1.20)
BUN: 38.4 mg/dL — ABNORMAL HIGH (ref 7.0–26.0)
CALCIUM: 9 mg/dL (ref 8.4–10.4)
CO2: 25 mEq/L (ref 22–29)
CREATININE: 1.7 mg/dL — AB (ref 0.6–1.1)
Chloride: 104 mEq/L (ref 98–109)
EGFR: 30 mL/min/{1.73_m2} — AB (ref >90)
Glucose: 98 mg/dl (ref 70–140)
Potassium: 3.7 mEq/L (ref 3.5–5.1)
Sodium: 139 mEq/L (ref 136–145)
Total Protein: 7.1 g/dL (ref 6.4–8.3)

## 2016-01-04 LAB — CBC WITH DIFFERENTIAL/PLATELET
BASO%: 0.7 % (ref 0.0–2.0)
Basophils Absolute: 0.1 10*3/uL (ref 0.0–0.1)
EOS ABS: 0.1 10*3/uL (ref 0.0–0.5)
EOS%: 0.7 % (ref 0.0–7.0)
HEMATOCRIT: 30.8 % — AB (ref 34.8–46.6)
HGB: 9.9 g/dL — ABNORMAL LOW (ref 11.6–15.9)
LYMPH#: 1.2 10*3/uL (ref 0.9–3.3)
LYMPH%: 10.7 % — AB (ref 14.0–49.7)
MCH: 27.4 pg (ref 25.1–34.0)
MCHC: 32.3 g/dL (ref 31.5–36.0)
MCV: 85 fL (ref 79.5–101.0)
MONO#: 0.9 10*3/uL (ref 0.1–0.9)
MONO%: 8.1 % (ref 0.0–14.0)
NEUT#: 8.7 10*3/uL — ABNORMAL HIGH (ref 1.5–6.5)
NEUT%: 79.8 % — ABNORMAL HIGH (ref 38.4–76.8)
PLATELETS: 326 10*3/uL (ref 145–400)
RBC: 3.63 10*6/uL — AB (ref 3.70–5.45)
RDW: 15.9 % — ABNORMAL HIGH (ref 11.2–14.5)
WBC: 10.9 10*3/uL — ABNORMAL HIGH (ref 3.9–10.3)

## 2016-01-04 NOTE — Telephone Encounter (Signed)
Pt came in today thinking her apt was for today per summary sheet, pt wanted to do labs while she was here. Changed labs for today and kept pts' apt with MD/inj for 02/08.... KJ

## 2016-01-04 NOTE — Progress Notes (Signed)
Sent message to mary to ck on status of forms left for dr. Serena Croissant on 12/30/15

## 2016-01-05 ENCOUNTER — Ambulatory Visit (HOSPITAL_BASED_OUTPATIENT_CLINIC_OR_DEPARTMENT_OTHER): Payer: 59 | Admitting: Internal Medicine

## 2016-01-05 ENCOUNTER — Other Ambulatory Visit: Payer: 59

## 2016-01-05 ENCOUNTER — Encounter: Payer: Self-pay | Admitting: Internal Medicine

## 2016-01-05 ENCOUNTER — Ambulatory Visit: Payer: 59

## 2016-01-05 ENCOUNTER — Telehealth: Payer: Self-pay | Admitting: Internal Medicine

## 2016-01-05 VITALS — BP 131/49 | HR 91 | Temp 97.8°F | Resp 18 | Ht 62.0 in | Wt 143.0 lb

## 2016-01-05 DIAGNOSIS — C7951 Secondary malignant neoplasm of bone: Secondary | ICD-10-CM

## 2016-01-05 DIAGNOSIS — C799 Secondary malignant neoplasm of unspecified site: Secondary | ICD-10-CM

## 2016-01-05 DIAGNOSIS — E860 Dehydration: Secondary | ICD-10-CM | POA: Diagnosis not present

## 2016-01-05 DIAGNOSIS — C3490 Malignant neoplasm of unspecified part of unspecified bronchus or lung: Secondary | ICD-10-CM | POA: Diagnosis not present

## 2016-01-05 DIAGNOSIS — E611 Iron deficiency: Secondary | ICD-10-CM

## 2016-01-05 DIAGNOSIS — R630 Anorexia: Secondary | ICD-10-CM | POA: Diagnosis not present

## 2016-01-05 DIAGNOSIS — C7801 Secondary malignant neoplasm of right lung: Secondary | ICD-10-CM

## 2016-01-05 DIAGNOSIS — D630 Anemia in neoplastic disease: Secondary | ICD-10-CM

## 2016-01-05 DIAGNOSIS — R634 Abnormal weight loss: Secondary | ICD-10-CM

## 2016-01-05 DIAGNOSIS — R05 Cough: Secondary | ICD-10-CM

## 2016-01-05 MED ORDER — HYDROCODONE-HOMATROPINE 5-1.5 MG/5ML PO SYRP: 5.0000 mL | ORAL_SOLUTION | Freq: Four times a day (QID) | ORAL | Status: DC | PRN

## 2016-01-05 MED ORDER — METHYLPREDNISOLONE 4 MG PO TBPK: ORAL_TABLET | ORAL | Status: DC

## 2016-01-05 NOTE — Progress Notes (Signed)
[place] [phone]   [phone]) [phone]  OFFICE PROGRESS NOTE  CLOWARD,DAVIS L, MD [address] [place] [address]  DIAGNOSIS: Metastatic non-small cell lung cancer, adenocarcinoma with positive EGFR mutation in exon 19 and negative ALK gene translocation diagnosed in July 2013 with bone metastasis.   PRIOR THERAPY:  1) Status post radiotherapy to the left face rib metastasis under the care of Dr. Lisbeth Renshaw.  2) stereotactic radiotherapy to the enlarging left lower lobe lung nodule under the care of Dr. Lisbeth Renshaw.  CURRENT THERAPY:  1) treatment with Tarceva 150 mg by mouth daily, therapy beginning 07/20/2012. Status post approximately 36 months of therapy.  2) Xgeva 120 mg subcutaneously every 4 weeks for bone disease.   CHEMOTHERAPY INTENT: Palliative  CURRENT # OF CHEMOTHERAPY CYCLES: 37 CURRENT ANTIEMETICS: Compazine  CURRENT SMOKING STATUS: Never smoker  ORAL CHEMOTHERAPY AND CONSENT: Tarceva  CURRENT BISPHOSPHONATES USE: Xgeva  PAIN MANAGEMENT: None  NARCOTICS INDUCED CONSTIPATION: None  LIVING WILL AND CODE STATUS: Full code  INTERVAL HISTORY: Jacqueline Leonard 70 y.o. female returns to the clinic today for monthly followup visit accompanied by her niece [place]. Over the last few weeks the patient has been complaining of increasing fatigue and weakness as well as as well as lack of appetite and dehydration. She lost around 15 pounds since her last visit. The patient also has mild skin rash mainly on the face. She denied having any significant diarrhea. She has no fever or chills, no nausea or vomiting. She has no weight loss or night sweats. The patient denied having any significant chest pain, shortness of breath, but she continues to have dry cough with no hemoptysis. She is here today with repeat CBC and comprehensive metabolic panel.  MEDICAL HISTORY: Past Medical History  Diagnosis Date  . Lung mass   . Hypertension   .  Hyperlipidemia   . Vitamin D deficiency   . Radiation 07/11/12-07/24/12    Palliative lung tx 30 gray in 10 fx  . Status post chemoradiation     Tarceva  . External hemorrhoids   . Allergy   . Anxiety   . Cancer, metastatic to bone ([facility]) 06/19/12    bx=L 5th ribmetastatic Adenocarcinoma with known lung mass  . S/P radiation therapy 07/22/14-07/31/14    left lung/60gy/80f    ALLERGIES:  is allergic to penicillins.  MEDICATIONS:  Current Outpatient Prescriptions  Medication Sig Dispense Refill  . amLODipine (NORVASC) 5 MG tablet Take 5 mg by mouth daily.    . cholecalciferol (VITAMIN D) 1000 UNITS tablet Take 1,000 Units by mouth daily.    .Marland Kitchenerlotinib (TARCEVA) 150 MG tablet Take 1 tablet (150 mg total) by mouth daily. Take on an empty stomach 1 hour before meals or 2 hours after. 90 tablet 0  . Multiple Minerals-Vitamins (CALCIUM & VIT D3 BONE HEALTH PO) Take 750 mg by mouth 2 (two) times daily.    . potassium chloride (K-DUR) 10 MEQ tablet Take 1 tablet by mouth daily.    . valsartan-hydrochlorothiazide (DIOVAN-HCT) 160-25 MG per tablet Take 1 tablet by mouth daily.    . clindamycin (CLINDAGEL) 1 % gel Reported on 01/05/2016    . Clobetasol Propionate 0.05 % shampoo Reported on 01/05/2016    . doxycycline (VIBRAMYCIN) 100 MG capsule Take 100 mg by mouth 2 (two) times daily. Reported on 01/05/2016    . fluocinonide (LIDEX) 0.05 % external solution Reported on 01/05/2016    . hydrocortisone 2.5 %  cream Reported on 01/05/2016     No current facility-administered medications for this visit.    SURGICAL HISTORY:  Past Surgical History  Procedure Laterality Date  . Soft tissue biopsy  06/19/12    L 5th rib=metastatic adenocarcinoma  . Ovarian cyst removal    . Appendectomy      REVIEW OF SYSTEMS:  Constitutional: positive for anorexia, fatigue and weight loss Eyes: negative Ears, nose, mouth, throat, and face: negative Respiratory: positive for cough Cardiovascular:  negative Gastrointestinal: negative Genitourinary:negative Integument/breast: positive for dryness and rash Hematologic/lymphatic: negative Musculoskeletal:positive for muscle weakness Neurological: negative Behavioral/Psych: negative Endocrine: negative Allergic/Immunologic: negative   PHYSICAL EXAMINATION: General appearance: alert, cooperative and no distress Head: Normocephalic, without obvious abnormality, atraumatic Neck: no adenopathy, no JVD, supple, symmetrical, trachea midline and thyroid not enlarged, symmetric, no tenderness/mass/nodules Lymph nodes: Cervical, supraclavicular, and axillary nodes normal. Resp: clear to auscultation bilaterally Back: symmetric, no curvature. ROM normal. No CVA tenderness. Cardio: regular rate and rhythm, S1, S2 normal, no murmur, click, rub or gallop GI: soft, non-tender; bowel sounds normal; no masses,  no organomegaly Extremities: extremities normal, atraumatic, no cyanosis or edema Neurologic: Alert and oriented X 3, normal strength and tone. Normal symmetric reflexes. Normal coordination and gait  ECOG PERFORMANCE STATUS: 1 - Symptomatic but completely ambulatory  Blood pressure 131/49, pulse 91, temperature 97.8 F (36.6 C), temperature source Oral, resp. rate 18, height 5' 2"  (1.575 m), weight 143 lb (64.864 kg), SpO2 100 %.  LABORATORY DATA: Lab Results  Component Value Date   WBC 10.9* 01/04/2016   HGB 9.9* 01/04/2016   HCT 30.8* 01/04/2016   MCV 85.0 01/04/2016   PLT 326 01/04/2016      Chemistry      Component Value Date/Time   NA 139 01/04/2016 1432   NA 136 09/23/2012 1525   K 3.7 01/04/2016 1432   K 4.0 09/23/2012 1525   CL 102 05/06/2013 1511   CL 101 09/23/2012 1525   CO2 25 01/04/2016 1432   CO2 26 09/23/2012 1525   BUN 38.4* 01/04/2016 1432   BUN 30* 09/23/2012 1525   CREATININE 1.7* 01/04/2016 1432   CREATININE 0.84 09/23/2012 1525      Component Value Date/Time   CALCIUM 9.0 01/04/2016 1432   CALCIUM  8.9 09/23/2012 1525   ALKPHOS 221* 01/04/2016 1432   ALKPHOS 52 09/23/2012 1525   AST 96* 01/04/2016 1432   AST 24 09/23/2012 1525   ALT 74* 01/04/2016 1432   ALT 31 09/23/2012 1525   BILITOT 0.88 01/04/2016 1432   BILITOT 0.8 09/23/2012 1525       RADIOGRAPHIC STUDIES: No results found. ASSESSMENT AND PLAN: This is a very pleasant 70 years old Hispanic female with metastatic non-small cell lung cancer, adenocarcinoma currently on treatment with Tarceva 150 mg by mouth daily status post 36 months of treatment and tolerating it fairly well. The patient looks a little bit worse today than her last visit with significant weight loss, lack of appetite and dehydration. I am concerned that the patient could have disease progression especially in the brain. I recommended for the patient to hold her treatment with Tarceva until I see her back for follow-up visit in 2 weeks. I will order repeat CT scan of the chest, abdomen and pelvis as well as MRI of the brain to rule out any evidence for disease progression. For the lack of appetite, I started the patient on Medrol dosepak and encouraged her to increase her by mouth intake of  fluid. For the metastatic bone disease, she will continue on Xgeva as previously scheduled but I will hold her dose for today until after repeating the imaging studies.  For the dry cough, I gave the patient prescription for Hycodan 5 ML by mouth every 6 hours as needed. For the iron deficiency, the patient will continue on over the counter ferrous sulfate. She would come back for followup visit in 2 weeks for evaluation after repeating the imaging studies and blood work.  She was advised to call immediately if she has any concerning symptoms in the interval. The patient voices understanding of current disease status and treatment options and is in agreement with the current care plan.  All questions were answered. The patient knows to call the clinic with any problems,  questions or concerns. We can certainly see the patient much sooner if necessary.  Disclaimer: This note was dictated with voice recognition software. Similar sounding words can inadvertently be transcribed and may not be corrected upon review.

## 2016-01-05 NOTE — Progress Notes (Signed)
I faxed [employer] forms to [phone] and left cpy for patient at front with ms wilma

## 2016-01-05 NOTE — Telephone Encounter (Signed)
per pof to sch pt appt-gave pt copy of avs °

## 2016-01-06 ENCOUNTER — Telehealth: Payer: Self-pay

## 2016-01-06 DIAGNOSIS — C7801 Secondary malignant neoplasm of right lung: Secondary | ICD-10-CM

## 2016-01-06 DIAGNOSIS — C799 Secondary malignant neoplasm of unspecified site: Secondary | ICD-10-CM

## 2016-01-06 DIAGNOSIS — D630 Anemia in neoplastic disease: Secondary | ICD-10-CM

## 2016-01-06 MED ORDER — METHYLPREDNISOLONE 4 MG PO TBPK
ORAL_TABLET | ORAL | Status: DC
Start: 2016-01-06 — End: 2016-01-17

## 2016-01-06 NOTE — Telephone Encounter (Signed)
Returned call to Ms Tacy Dura and let her know Rx had been sent to wrong pharmacy.  New Rx sent to [facility].  Fax sent to [facility] to cancel Rx.   She requested different date/time for MD appt.  Writer went through MM schedule with her - no appt that are convenient for her.  She requests to keep the appt they have on the 20th, but to put pt on a list in the event of a cancellation on the 21st.  I let her know that clinic does not keep a wait list, but that I would let desk nurse know of her request.  She voiced understanding.   Routed to pod 3

## 2016-01-13 ENCOUNTER — Ambulatory Visit (HOSPITAL_COMMUNITY)
Admission: RE | Admit: 2016-01-13 | Discharge: 2016-01-13 | Disposition: A | Discharge status: Home / Self Care | Payer: 59 | Source: Ambulatory Visit | Attending: Internal Medicine | Admitting: Internal Medicine

## 2016-01-13 ENCOUNTER — Encounter (HOSPITAL_COMMUNITY): Payer: Self-pay

## 2016-01-13 DIAGNOSIS — C7801 Secondary malignant neoplasm of right lung: Secondary | ICD-10-CM

## 2016-01-13 DIAGNOSIS — I619 Nontraumatic intracerebral hemorrhage, unspecified: Secondary | ICD-10-CM | POA: Insufficient documentation

## 2016-01-13 DIAGNOSIS — Z923 Personal history of irradiation: Secondary | ICD-10-CM | POA: Diagnosis not present

## 2016-01-13 DIAGNOSIS — C799 Secondary malignant neoplasm of unspecified site: Secondary | ICD-10-CM

## 2016-01-13 DIAGNOSIS — R188 Other ascites: Secondary | ICD-10-CM | POA: Insufficient documentation

## 2016-01-13 DIAGNOSIS — D630 Anemia in neoplastic disease: Secondary | ICD-10-CM

## 2016-01-13 DIAGNOSIS — J90 Pleural effusion, not elsewhere classified: Secondary | ICD-10-CM | POA: Insufficient documentation

## 2016-01-13 DIAGNOSIS — G939 Disorder of brain, unspecified: Secondary | ICD-10-CM | POA: Diagnosis not present

## 2016-01-13 DIAGNOSIS — C801 Malignant (primary) neoplasm, unspecified: Secondary | ICD-10-CM | POA: Insufficient documentation

## 2016-01-13 DIAGNOSIS — C7951 Secondary malignant neoplasm of bone: Secondary | ICD-10-CM | POA: Insufficient documentation

## 2016-01-13 DIAGNOSIS — Z9221 Personal history of antineoplastic chemotherapy: Secondary | ICD-10-CM | POA: Diagnosis not present

## 2016-01-13 MED ORDER — GADOBENATE DIMEGLUMINE 529 MG/ML IV SOLN
10.0000 mL | Freq: Once | INTRAVENOUS | Status: AC | PRN
  Administered 2016-01-13: 6 mL via INTRAVENOUS

## 2016-01-13 MED ORDER — IOHEXOL 300 MG/ML  SOLN
100.0000 mL | Freq: Once | INTRAMUSCULAR | Status: AC | PRN
  Administered 2016-01-13: 80 mL via INTRAVENOUS

## 2016-01-14 ENCOUNTER — Other Ambulatory Visit (HOSPITAL_BASED_OUTPATIENT_CLINIC_OR_DEPARTMENT_OTHER): Payer: 59

## 2016-01-14 ENCOUNTER — Other Ambulatory Visit: Payer: 59

## 2016-01-14 DIAGNOSIS — C7801 Secondary malignant neoplasm of right lung: Secondary | ICD-10-CM

## 2016-01-14 DIAGNOSIS — C799 Secondary malignant neoplasm of unspecified site: Secondary | ICD-10-CM

## 2016-01-14 DIAGNOSIS — C3490 Malignant neoplasm of unspecified part of unspecified bronchus or lung: Secondary | ICD-10-CM

## 2016-01-14 DIAGNOSIS — D630 Anemia in neoplastic disease: Secondary | ICD-10-CM

## 2016-01-14 LAB — COMPREHENSIVE METABOLIC PANEL
ALT: 136 U/L — ABNORMAL HIGH (ref 0–55)
ANION GAP: 7 meq/L (ref 3–11)
AST: 146 U/L — ABNORMAL HIGH (ref 5–34)
Albumin: 2.5 g/dL — ABNORMAL LOW (ref 3.5–5.0)
Alkaline Phosphatase: 237 U/L — ABNORMAL HIGH (ref 40–150)
BUN: 24.3 mg/dL (ref 7.0–26.0)
CALCIUM: 8.9 mg/dL (ref 8.4–10.4)
CHLORIDE: 105 meq/L (ref 98–109)
CO2: 26 meq/L (ref 22–29)
Creatinine: 1 mg/dL (ref 0.6–1.1)
EGFR: 57 mL/min/{1.73_m2} — ABNORMAL LOW (ref >90)
GLUCOSE: 101 mg/dL (ref 70–140)
POTASSIUM: 4.4 meq/L (ref 3.5–5.1)
Sodium: 137 mEq/L (ref 136–145)
Total Bilirubin: 0.93 mg/dL (ref 0.20–1.20)
Total Protein: 6.3 g/dL — ABNORMAL LOW (ref 6.4–8.3)

## 2016-01-14 LAB — CBC WITH DIFFERENTIAL/PLATELET
BASO%: 0.2 % (ref 0.0–2.0)
BASOS ABS: 0 10*3/uL (ref 0.0–0.1)
EOS%: 1.7 % (ref 0.0–7.0)
Eosinophils Absolute: 0.2 10*3/uL (ref 0.0–0.5)
HEMATOCRIT: 32.7 % — AB (ref 34.8–46.6)
HGB: 10.6 g/dL — ABNORMAL LOW (ref 11.6–15.9)
LYMPH#: 0.9 10*3/uL (ref 0.9–3.3)
LYMPH%: 7.4 % — AB (ref 14.0–49.7)
MCH: 27.9 pg (ref 25.1–34.0)
MCHC: 32.4 g/dL (ref 31.5–36.0)
MCV: 86.1 fL (ref 79.5–101.0)
MONO#: 1.2 10*3/uL — AB (ref 0.1–0.9)
MONO%: 10.1 % (ref 0.0–14.0)
NEUT#: 9.3 10*3/uL — ABNORMAL HIGH (ref 1.5–6.5)
NEUT%: 80.6 % — AB (ref 38.4–76.8)
PLATELETS: 255 10*3/uL (ref 145–400)
RBC: 3.8 10*6/uL (ref 3.70–5.45)
RDW: 17.4 % — ABNORMAL HIGH (ref 11.2–14.5)
WBC: 11.6 10*3/uL — ABNORMAL HIGH (ref 3.9–10.3)

## 2016-01-17 ENCOUNTER — Ambulatory Visit (HOSPITAL_BASED_OUTPATIENT_CLINIC_OR_DEPARTMENT_OTHER): Payer: 59 | Admitting: Internal Medicine

## 2016-01-17 ENCOUNTER — Telehealth: Payer: Self-pay | Admitting: Internal Medicine

## 2016-01-17 ENCOUNTER — Ambulatory Visit (HOSPITAL_BASED_OUTPATIENT_CLINIC_OR_DEPARTMENT_OTHER): Payer: 59

## 2016-01-17 ENCOUNTER — Encounter: Payer: Self-pay | Admitting: Internal Medicine

## 2016-01-17 VITALS — BP 133/59 | HR 88 | Temp 97.9°F | Resp 18 | Ht 62.0 in | Wt 140.3 lb

## 2016-01-17 DIAGNOSIS — C3490 Malignant neoplasm of unspecified part of unspecified bronchus or lung: Secondary | ICD-10-CM

## 2016-01-17 DIAGNOSIS — C799 Secondary malignant neoplasm of unspecified site: Secondary | ICD-10-CM

## 2016-01-17 DIAGNOSIS — C7801 Secondary malignant neoplasm of right lung: Secondary | ICD-10-CM

## 2016-01-17 LAB — COMPREHENSIVE METABOLIC PANEL
ALT: 148 U/L — AB (ref 0–55)
AST: 160 U/L — AB (ref 5–34)
Albumin: 2.7 g/dL — ABNORMAL LOW (ref 3.5–5.0)
Alkaline Phosphatase: 336 U/L — ABNORMAL HIGH (ref 40–150)
Anion Gap: 10 mEq/L (ref 3–11)
BUN: 21.2 mg/dL (ref 7.0–26.0)
CHLORIDE: 104 meq/L (ref 98–109)
CO2: 23 mEq/L (ref 22–29)
Calcium: 9 mg/dL (ref 8.4–10.4)
Creatinine: 1 mg/dL (ref 0.6–1.1)
EGFR: 58 mL/min/{1.73_m2} — ABNORMAL LOW (ref >90)
GLUCOSE: 94 mg/dL (ref 70–140)
POTASSIUM: 4.3 meq/L (ref 3.5–5.1)
Sodium: 136 mEq/L (ref 136–145)
Total Bilirubin: 1.09 mg/dL (ref 0.20–1.20)
Total Protein: 7 g/dL (ref 6.4–8.3)

## 2016-01-17 LAB — CBC WITH DIFFERENTIAL/PLATELET
BASO%: 0.5 % (ref 0.0–2.0)
Basophils Absolute: 0.1 10*3/uL (ref 0.0–0.1)
EOS%: 1.2 % (ref 0.0–7.0)
Eosinophils Absolute: 0.1 10*3/uL (ref 0.0–0.5)
HCT: 36.4 % (ref 34.8–46.6)
HGB: 11.6 g/dL (ref 11.6–15.9)
LYMPH%: 6 % — AB (ref 14.0–49.7)
MCH: 27.8 pg (ref 25.1–34.0)
MCHC: 31.9 g/dL (ref 31.5–36.0)
MCV: 86.9 fL (ref 79.5–101.0)
MONO#: 1 10*3/uL — ABNORMAL HIGH (ref 0.1–0.9)
MONO%: 8.8 % (ref 0.0–14.0)
NEUT#: 9.4 10*3/uL — ABNORMAL HIGH (ref 1.5–6.5)
NEUT%: 83.5 % — AB (ref 38.4–76.8)
Platelets: 231 10*3/uL (ref 145–400)
RBC: 4.19 10*6/uL (ref 3.70–5.45)
RDW: 18.4 % — ABNORMAL HIGH (ref 11.2–14.5)
WBC: 11.3 10*3/uL — ABNORMAL HIGH (ref 3.9–10.3)
lymph#: 0.7 10*3/uL — ABNORMAL LOW (ref 0.9–3.3)

## 2016-01-17 NOTE — Telephone Encounter (Signed)
Pt confirmed labs/ov per 02/20 POF, gave pt AVS and Calendar... KJ

## 2016-01-17 NOTE — Progress Notes (Signed)
[facility] Telephone:([phone]) [phone]   Fax:(336) [phone]  OFFICE PROGRESS NOTE  CLOWARD,DAVIS L, MD [address] [place] [address]  DIAGNOSIS: Metastatic non-small cell lung cancer, adenocarcinoma with positive EGFR mutation in exon 19 and negative ALK gene translocation diagnosed in July 2013 with bone metastasis.   PRIOR THERAPY:  1) Status post radiotherapy to the left face rib metastasis under the care of Dr. Lisbeth Renshaw.  2) stereotactic radiotherapy to the enlarging left lower lobe lung nodule under the care of Dr. Lisbeth Renshaw.  CURRENT THERAPY:  1) treatment with Tarceva 150 mg by mouth daily, therapy beginning 07/20/2012. Status post approximately 36 months of therapy.  2) Xgeva 120 mg subcutaneously every 4 weeks for bone disease.   CHEMOTHERAPY INTENT: Palliative  CURRENT # OF CHEMOTHERAPY CYCLES: 37 CURRENT ANTIEMETICS: Compazine  CURRENT SMOKING STATUS: Never smoker  ORAL CHEMOTHERAPY AND CONSENT: Tarceva  CURRENT BISPHOSPHONATES USE: Xgeva  PAIN MANAGEMENT: None  NARCOTICS INDUCED CONSTIPATION: None  LIVING WILL AND CODE STATUS: Full code  INTERVAL HISTORY: Jacqueline Leonard 70 y.o. female returns to the clinic today for monthly followup visit accompanied by her niece [place]. The patient has been complaining of increasing fatigue and weakness as well as as well as lack of appetite. She lost around 3 more pounds since her last visit. She has been off Tarceva since February 2017. The patient also has mild skin rash mainly on the face. She denied having any significant diarrhea. She has no fever or chills, no nausea or vomiting. She has no weight loss or night sweats. The patient denied having any significant chest pain, shortness of breath, but she continues to have dry cough with no hemoptysis. She had repeat bloodwork in addition to CT scan of the chest, abdomen and pelvis and brain MRI performed recently and she is here for evaluation and discussion of  her lab and his scan results.  MEDICAL HISTORY: Past Medical History  Diagnosis Date  . Lung mass   . Hypertension   . Hyperlipidemia   . Vitamin D deficiency   . Radiation 07/11/12-07/24/12    Palliative lung tx 30 gray in 10 fx  . Status post chemoradiation     Tarceva  . External hemorrhoids   . Allergy   . Anxiety   . Cancer, metastatic to bone ([place]) 06/19/12    bx=L 5th ribmetastatic Adenocarcinoma with known lung mass  . S/P radiation therapy 07/22/14-07/31/14    left lung/60gy/15f    ALLERGIES:  is allergic to penicillins.  MEDICATIONS:  Current Outpatient Prescriptions  Medication Sig Dispense Refill  . amLODipine (NORVASC) 5 MG tablet Take 5 mg by mouth daily.    . cholecalciferol (VITAMIN D) 1000 UNITS tablet Take 1,000 Units by mouth daily.    . clindamycin (CLINDAGEL) 1 % gel Reported on 01/05/2016    . Clobetasol Propionate 0.05 % shampoo Reported on 01/05/2016    . doxycycline (VIBRAMYCIN) 100 MG capsule Take 100 mg by mouth 2 (two) times daily. Reported on 01/05/2016    . erlotinib (TARCEVA) 150 MG tablet Take 1 tablet (150 mg total) by mouth daily. Take on an empty stomach 1 hour before meals or 2 hours after. 90 tablet 0  . fluocinonide (LIDEX) 0.05 % external solution Reported on 01/05/2016    . HYDROcodone-homatropine (HYCODAN) 5-1.5 MG/5ML syrup Take 5 mLs by mouth every 6 (six) hours as needed for cough. 120 mL 0  . hydrocortisone 2.5 % cream Reported on 01/05/2016    .  methylPREDNISolone (MEDROL DOSEPAK) 4 MG TBPK tablet Use as instructed 21 tablet 0  . Multiple Minerals-Vitamins (CALCIUM & VIT D3 BONE HEALTH PO) Take 750 mg by mouth 2 (two) times daily.    . potassium chloride (K-DUR) 10 MEQ tablet Take 1 tablet by mouth daily.    . valsartan-hydrochlorothiazide (DIOVAN-HCT) 160-25 MG per tablet Take 1 tablet by mouth daily.     No current facility-administered medications for this visit.    SURGICAL HISTORY:  Past Surgical History  Procedure Laterality Date  .  Soft tissue biopsy  06/19/12    L 5th rib=metastatic adenocarcinoma  . Ovarian cyst removal    . Appendectomy      REVIEW OF SYSTEMS:  Constitutional: positive for anorexia, fatigue and weight loss Eyes: negative Ears, nose, mouth, throat, and face: negative Respiratory: positive for cough Cardiovascular: negative Gastrointestinal: negative Genitourinary:negative Integument/breast: positive for dryness and rash Hematologic/lymphatic: negative Musculoskeletal:positive for muscle weakness Neurological: negative Behavioral/Psych: negative Endocrine: negative Allergic/Immunologic: negative   PHYSICAL EXAMINATION: General appearance: alert, cooperative and no distress Head: Normocephalic, without obvious abnormality, atraumatic Neck: no adenopathy, no JVD, supple, symmetrical, trachea midline and thyroid not enlarged, symmetric, no tenderness/mass/nodules Lymph nodes: Cervical, supraclavicular, and axillary nodes normal. Resp: clear to auscultation bilaterally Back: symmetric, no curvature. ROM normal. No CVA tenderness. Cardio: regular rate and rhythm, S1, S2 normal, no murmur, click, rub or gallop GI: soft, non-tender; bowel sounds normal; no masses,  no organomegaly Extremities: extremities normal, atraumatic, no cyanosis or edema Neurologic: Alert and oriented X 3, normal strength and tone. Normal symmetric reflexes. Normal coordination and gait  ECOG PERFORMANCE STATUS: 1 - Symptomatic but completely ambulatory  There were no vitals taken for this visit.  LABORATORY DATA: Lab Results  Component Value Date   WBC 11.6* 01/14/2016   HGB 10.6* 01/14/2016   HCT 32.7* 01/14/2016   MCV 86.1 01/14/2016   PLT 255 01/14/2016      Chemistry      Component Value Date/Time   NA 137 01/14/2016 0904   NA 136 09/23/2012 1525   K 4.4 01/14/2016 0904   K 4.0 09/23/2012 1525   CL 102 05/06/2013 1511   CL 101 09/23/2012 1525   CO2 26 01/14/2016 0904   CO2 26 09/23/2012 1525   BUN  24.3 01/14/2016 0904   BUN 30* 09/23/2012 1525   CREATININE 1.0 01/14/2016 0904   CREATININE 0.84 09/23/2012 1525      Component Value Date/Time   CALCIUM 8.9 01/14/2016 0904   CALCIUM 8.9 09/23/2012 1525   ALKPHOS 237* 01/14/2016 0904   ALKPHOS 52 09/23/2012 1525   AST 146* 01/14/2016 0904   AST 24 09/23/2012 1525   ALT 136* 01/14/2016 0904   ALT 31 09/23/2012 1525   BILITOT 0.93 01/14/2016 0904   BILITOT 0.8 09/23/2012 1525       RADIOGRAPHIC STUDIES: Ct Chest W Contrast  01/13/2016  CLINICAL DATA:  Restaging metastatic lung cancer diagnosed in July 2013. Oral chemotherapy in progress. Radiation therapy completed. EXAM: CT CHEST, ABDOMEN, AND PELVIS WITH CONTRAST TECHNIQUE: Multidetector CT imaging of the chest, abdomen and pelvis was performed following the standard protocol during bolus administration of intravenous contrast. CONTRAST:  62m OMNIPAQUE IOHEXOL 300 MG/ML  SOLN COMPARISON:  CT 06/22/2015 and 10/27/2015. FINDINGS: CT CHEST Mediastinum/Nodes: There are no enlarged mediastinal, hilar or axillary lymph nodes. The thyroid gland, trachea and esophagus demonstrate no significant findings. Stable cardiomegaly. There is no pericardial effusion. Mild atherosclerosis of the aorta, great vessels and coronary  arteries is noted. Lungs/Pleura: There is no pleural effusion. A small dependent left pleural effusion appears similar in volume. There is no significant pleural fluid on the right. The probable postradiation changes within the superior segment of the left lower lobe are similar with mass-like density and bronchiectasis. However, there has been progression in diffuse bilateral pulmonary nodularity and interstitial thickening worrisome for metastatic disease/lymphangitic spread of tumor. A representative nodule in the right lower lobe measures 8 mm on image 34. There are slightly more confluent components in right upper lobe which could be inflammatory. Musculoskeletal/Chest wall: The  expansile lucent lesion within the left fourth rib has not significantly changed. However, the patient has developed a lytic lesion within the left aspect of the T3 vertebral body. This demonstrates surrounding sclerosis and a small amount of epidural tumor, not expected to cause cord compression. No significant pathologic fracture. There are multiple hemangiomas within the thoracolumbar spine. CT ABDOMEN AND PELVIS FINDINGS Hepatobiliary: Interval development of diffuse heterogeneity throughout the liver on the portal phase images. This could be due to chronic passive congestion, although infiltrating tumor cannot be excluded. There is gallbladder wall thickening with mild pericholecystic fluid. No significant biliary dilatation. Pancreas: Unremarkable. No pancreatic ductal dilatation or surrounding inflammatory changes. Spleen: Normal in size without focal abnormality. Adrenals/Urinary Tract: Both adrenal glands appear normal. The left kidney demonstrates decreased cortical enhancement and progressive dilatation of its pelvis and calices. There is delayed contrast excretion on the delayed post-contrast images. The distal left ureter is decompressed. There is no evidence of ureteral calculus. There is a stable cyst in the lower pole of the left kidney. The bladder is decompressed without focal abnormality. Stomach/Bowel: No evidence of bowel wall thickening, distention or surrounding inflammatory change. Vascular/Lymphatic: There are no enlarged abdominal or pelvic lymph nodes. There is no retroperitoneal mass accounting for the apparent obstruction of the proximal left ureter. Atherosclerosis of the aorta, its branches and the iliac arteries is seen. No significant venous findings. Reproductive: Stable calcified uterine fibroids. No evidence of adnexal mass. Other: Interval development of a small amount of ascites with edema throughout the mesenteric fat. No definite peritoneal nodularity to suggest  carcinomatosis. Musculoskeletal: No acute or significant osseous findings. No definite osseous metastases are seen within the abdomen or pelvis. Hemangiomas are present within the L1 and L4 vertebral bodies. There are chronic L5 pars defects with a grade 1 anterolisthesis and biforaminal narrowing at L5-S1. IMPRESSION: 1. Interval significant progression of nodularity and septal thickening throughout both lungs worrisome for metastatic disease/lymphangitic tumor. 2. The treated tumor in the left lower lobe and the left pleural effusion have not significantly changed. 3. New diffuse heterogeneity of the liver worrisome for possible infiltrating tumor. This could be due to chronic passive congestion. 4. New apparent obstruction of the proximal left ureter without demonstrable etiology. This could be secondary to metastatic disease, blood clot or stricture. 5. New metastasis within the T3 vertebral body. Stable expansile lesion within the left fourth rib. 6. New ascites with nonspecific gallbladder and small bowel wall thickening. No definite signs of peritoneal carcinomatosis. Electronically Signed   By: Richardean Sale M.D.   On: 01/13/2016 17:13   Mr Jeri Cos YO Contrast  01/13/2016  CLINICAL DATA:  Metastatic breast cancer EXAM: MRI HEAD WITHOUT AND WITH CONTRAST TECHNIQUE: Multiplanar, multiecho pulse sequences of the brain and surrounding structures were obtained without and with intravenous contrast. CONTRAST:  27m MULTIHANCE GADOBENATE DIMEGLUMINE 529 MG/ML IV SOLN COMPARISON:  MRI 07/04/2012 FINDINGS: Chronic hemorrhage  in the left occipital lobe unchanged from prior study. Small amount of associated enhancement which has a tubular shape. There is less enhancement on today's study compared with the prior study. I would favor this is an area of prior hemorrhage from a vascular malformation such as a developmental venous anomaly. The enhancement pattern and hemorrhage does not have the appearance of metastatic  disease. Correlate with any history of treatment to the area. No other areas of abnormal enhancement. No mass or edema in the brain. Ventricle size normal.  No shift of the midline structures. Negative for acute infarct. Hyperintensity right parietal white matter unchanged from the prior study. Brainstem and cerebellum intact. Mild mucosal edema in the paranasal sinuses. IMPRESSION: Small area of hemorrhage and associated tubular shaped enhancement in the left occipital lobe. This was present in 2013. Enhancement pattern is slightly improved. 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 No other areas are seen suspicious for metastatic disease. Electronically Signed   By: Franchot Gallo M.D.   On: 01/13/2016 16:31   Ct Abdomen Pelvis W Contrast  01/13/2016  CLINICAL DATA:  Restaging metastatic lung cancer diagnosed in July 2013. Oral chemotherapy in progress. Radiation therapy completed. EXAM: CT CHEST, ABDOMEN, AND PELVIS WITH CONTRAST TECHNIQUE: Multidetector CT imaging of the chest, abdomen and pelvis was performed following the standard protocol during bolus administration of intravenous contrast. CONTRAST:  73m OMNIPAQUE IOHEXOL 300 MG/ML  SOLN COMPARISON:  CT 06/22/2015 and 10/27/2015. FINDINGS: CT CHEST Mediastinum/Nodes: There are no enlarged mediastinal, hilar or axillary lymph nodes. The thyroid gland, trachea and esophagus demonstrate no significant findings. Stable cardiomegaly. There is no pericardial effusion. Mild atherosclerosis of the aorta, great vessels and coronary arteries is noted. Lungs/Pleura: There is no pleural effusion. A small dependent left pleural effusion appears similar in volume. There is no significant pleural fluid on the right. The probable postradiation changes within the superior segment of the left lower lobe are similar with mass-like density and bronchiectasis. However, there has been progression in diffuse bilateral  pulmonary nodularity and interstitial thickening worrisome for metastatic disease/lymphangitic spread of tumor. A representative nodule in the right lower lobe measures 8 mm on image 34. There are slightly more confluent components in right upper lobe which could be inflammatory. Musculoskeletal/Chest wall: The expansile lucent lesion within the left fourth rib has not significantly changed. However, the patient has developed a lytic lesion within the left aspect of the T3 vertebral body. This demonstrates surrounding sclerosis and a small amount of epidural tumor, not expected to cause cord compression. No significant pathologic fracture. There are multiple hemangiomas within the thoracolumbar spine. CT ABDOMEN AND PELVIS FINDINGS Hepatobiliary: Interval development of diffuse heterogeneity throughout the liver on the portal phase images. This could be due to chronic passive congestion, although infiltrating tumor cannot be excluded. There is gallbladder wall thickening with mild pericholecystic fluid. No significant biliary dilatation. Pancreas: Unremarkable. No pancreatic ductal dilatation or surrounding inflammatory changes. Spleen: Normal in size without focal abnormality. Adrenals/Urinary Tract: Both adrenal glands appear normal. The left kidney demonstrates decreased cortical enhancement and progressive dilatation of its pelvis and calices. There is delayed contrast excretion on the delayed post-contrast images. The distal left ureter is decompressed. There is no evidence of ureteral calculus. There is a stable cyst in the lower pole of the left kidney. The bladder is decompressed without focal abnormality. Stomach/Bowel: No evidence of bowel wall thickening, distention or surrounding inflammatory change. Vascular/Lymphatic: There are no enlarged  abdominal or pelvic lymph nodes. There is no retroperitoneal mass accounting for the apparent obstruction of the proximal left ureter. Atherosclerosis of the aorta,  its branches and the iliac arteries is seen. No significant venous findings. Reproductive: Stable calcified uterine fibroids. No evidence of adnexal mass. Other: Interval development of a small amount of ascites with edema throughout the mesenteric fat. No definite peritoneal nodularity to suggest carcinomatosis. Musculoskeletal: No acute or significant osseous findings. No definite osseous metastases are seen within the abdomen or pelvis. Hemangiomas are present within the L1 and L4 vertebral bodies. There are chronic L5 pars defects with a grade 1 anterolisthesis and biforaminal narrowing at L5-S1. IMPRESSION: 1. Interval significant progression of nodularity and septal thickening throughout both lungs worrisome for metastatic disease/lymphangitic tumor. 2. The treated tumor in the left lower lobe and the left pleural effusion have not significantly changed. 3. New diffuse heterogeneity of the liver worrisome for possible infiltrating tumor. This could be due to chronic passive congestion. 4. New apparent obstruction of the proximal left ureter without demonstrable etiology. This could be secondary to metastatic disease, blood clot or stricture. 5. New metastasis within the T3 vertebral body. Stable expansile lesion within the left fourth rib. 6. New ascites with nonspecific gallbladder and small bowel wall thickening. No definite signs of peritoneal carcinomatosis. Electronically Signed   By: Richardean Sale M.D.   On: 01/13/2016 17:13   ASSESSMENT AND PLAN: This is a very pleasant 70 years old Hispanic female with metastatic non-small cell lung cancer, adenocarcinoma currently on treatment with Tarceva 150 mg by mouth daily status post 36 months of treatment and tolerating it fairly well. She is currently off treatment with Tarceva since 01/05/2016. Her recent CT scan of the chest, abdomen and pelvis showed evidence for significant disease progression in the lungs as well as liver. Her comprehensive  metabolic panel also showed liver dysfunction consistent with her disease progression in the liver. Her brain MRI showed no concerning finding for disease progression in the brain. I discussed the scan results and showed the images to the patient and her niece. I recommended for her to discontinue treatment was Tarceva at this point. I will send the patient back to the lab for blood test for EGFR mutation analysis to see if the patient developed a T790M resistant mutation. I would see her back for follow-up visit in 2 weeks for reevaluation and discussion of her treatment options based on the mutation status. For the metastatic bone disease, she will continue on Xgeva as previously scheduled but I will hold her dose for today until after repeating the imaging studies.  For the dry cough, I gave the patient prescription for Hycodan 5 ML by mouth every 6 hours as needed. For the iron deficiency, the patient will continue on over the counter ferrous sulfate. She was advised to call immediately if she has any concerning symptoms in the interval. The patient voices understanding of current disease status and treatment options and is in agreement with the current care plan.  All questions were answered. The patient knows to call the clinic with any problems, questions or concerns. We can certainly see the patient much sooner if necessary.  Disclaimer: This note was dictated with voice recognition software. Similar sounding words can inadvertently be transcribed and may not be corrected upon review.

## 2016-01-18 ENCOUNTER — Telehealth: Payer: Self-pay | Admitting: Internal Medicine

## 2016-01-18 NOTE — Telephone Encounter (Signed)
Per staff message from LT moved 3/6 f/u w/LT from 3:15 pm to 2:45 pm and extended time to 60 minutes. Spoke with patient re change and new time for lab/LT 3/6 @ 2:15 pm.

## 2016-01-21 LAB — EPIDERMAL GROWTH FACTOR RECEPTOR (EGFR) MUTATION ANALYSIS

## 2016-01-31 ENCOUNTER — Other Ambulatory Visit: Payer: Self-pay | Admitting: Nurse Practitioner

## 2016-01-31 ENCOUNTER — Other Ambulatory Visit: Payer: Self-pay

## 2016-01-31 ENCOUNTER — Telehealth: Payer: Self-pay | Admitting: Internal Medicine

## 2016-01-31 ENCOUNTER — Telehealth: Payer: Self-pay | Admitting: Pharmacist

## 2016-01-31 ENCOUNTER — Other Ambulatory Visit (HOSPITAL_BASED_OUTPATIENT_CLINIC_OR_DEPARTMENT_OTHER): Payer: 59

## 2016-01-31 ENCOUNTER — Ambulatory Visit (HOSPITAL_BASED_OUTPATIENT_CLINIC_OR_DEPARTMENT_OTHER): Payer: 59 | Admitting: Nurse Practitioner

## 2016-01-31 VITALS — BP 115/46 | HR 87 | Temp 97.9°F | Resp 18 | Ht 62.0 in | Wt 137.9 lb

## 2016-01-31 DIAGNOSIS — C799 Secondary malignant neoplasm of unspecified site: Secondary | ICD-10-CM

## 2016-01-31 DIAGNOSIS — C3432 Malignant neoplasm of lower lobe, left bronchus or lung: Secondary | ICD-10-CM | POA: Diagnosis not present

## 2016-01-31 DIAGNOSIS — C7951 Secondary malignant neoplasm of bone: Secondary | ICD-10-CM

## 2016-01-31 LAB — CBC WITH DIFFERENTIAL/PLATELET
BASO%: 0.4 % (ref 0.0–2.0)
Basophils Absolute: 0 10*3/uL (ref 0.0–0.1)
EOS%: 1.3 % (ref 0.0–7.0)
Eosinophils Absolute: 0.1 10*3/uL (ref 0.0–0.5)
HCT: 36.5 % (ref 34.8–46.6)
HGB: 11.8 g/dL (ref 11.6–15.9)
LYMPH%: 10.1 % — AB (ref 14.0–49.7)
MCH: 28.5 pg (ref 25.1–34.0)
MCHC: 32.3 g/dL (ref 31.5–36.0)
MCV: 88.4 fL (ref 79.5–101.0)
MONO#: 0.9 10*3/uL (ref 0.1–0.9)
MONO%: 9.4 % (ref 0.0–14.0)
NEUT%: 78.8 % — ABNORMAL HIGH (ref 38.4–76.8)
NEUTROS ABS: 7.3 10*3/uL — AB (ref 1.5–6.5)
Platelets: 253 10*3/uL (ref 145–400)
RBC: 4.13 10*6/uL (ref 3.70–5.45)
RDW: 20.7 % — ABNORMAL HIGH (ref 11.2–14.5)
WBC: 9.3 10*3/uL (ref 3.9–10.3)
lymph#: 0.9 10*3/uL (ref 0.9–3.3)

## 2016-01-31 LAB — COMPREHENSIVE METABOLIC PANEL
ALK PHOS: 679 U/L — AB (ref 40–150)
ALT: 199 U/L — AB (ref 0–55)
ANION GAP: 11 meq/L (ref 3–11)
AST: 305 U/L (ref 5–34)
Albumin: 2.6 g/dL — ABNORMAL LOW (ref 3.5–5.0)
BILIRUBIN TOTAL: 3.1 mg/dL — AB (ref 0.20–1.20)
BUN: 30 mg/dL — ABNORMAL HIGH (ref 7.0–26.0)
CALCIUM: 8.8 mg/dL (ref 8.4–10.4)
CO2: 23 mEq/L (ref 22–29)
CREATININE: 1.2 mg/dL — AB (ref 0.6–1.1)
Chloride: 104 mEq/L (ref 98–109)
EGFR: 45 mL/min/{1.73_m2} — AB (ref >90)
Glucose: 118 mg/dl (ref 70–140)
Potassium: 3.9 mEq/L (ref 3.5–5.1)
Sodium: 137 mEq/L (ref 136–145)
TOTAL PROTEIN: 6.8 g/dL (ref 6.4–8.3)

## 2016-01-31 MED ORDER — OSIMERTINIB MESYLATE 80 MG PO TABS: 80.0000 mg | ORAL_TABLET | Freq: Every day | ORAL | Status: DC

## 2016-01-31 MED ORDER — METHYLPREDNISOLONE 4 MG PO TBPK
ORAL_TABLET | ORAL | Status: DC
Start: 2016-01-31 — End: 2016-02-14

## 2016-01-31 NOTE — Progress Notes (Addendum)
[facility] OFFICE PROGRESS NOTE   DIAGNOSIS: Metastatic non-small cell lung cancer, adenocarcinoma with positive EGFR mutation in exon 19 and negative ALK gene translocation diagnosed in July 2013 with bone metastasis.   PRIOR THERAPY:  1) Status post radiotherapy to the left rib metastasis under the care of Dr. Lisbeth Renshaw.  2) stereotactic radiotherapy to the enlarging left lower lobe lung nodule under the care of Dr. Lisbeth Renshaw. 3) Tarceva 150 mg by mouth daily, therapy beginning 07/20/2012. Status post approximately 36 months of therapy. Discontinued February 2017 due to disease progression. Found to have T790M mutation.   CURRENT THERAPY:  1) Tagrisso prescribed 01/31/2016 2) Xgeva 120 mg subcutaneously every 4 weeks for bone disease.   CHEMOTHERAPY INTENT: Palliative  CURRENT # OF CHEMOTHERAPY CYCLES: 37 CURRENT ANTIEMETICS: Compazine  CURRENT SMOKING STATUS: Never smoker  ORAL CHEMOTHERAPY AND CONSENT: Tarceva  CURRENT BISPHOSPHONATES USE: Xgeva  PAIN MANAGEMENT: None  NARCOTICS INDUCED CONSTIPATION: None  LIVING WILL AND CODE STATUS: Full code   INTERVAL HISTORY:   Jacqueline Leonard returns as scheduled. She denies shortness of breath. Periodic cough. No fever. Poor appetite. She continues to lose weight. She notes early satiety. Bowels moving regularly.  Objective:  Vital signs in last 24 hours:  Blood pressure 115/46, pulse 87, temperature 97.9 F (36.6 C), temperature source Oral, resp. rate 18, height 5' 2"  (1.575 m), weight 137 lb 14.4 oz (62.551 kg), SpO2 98 %.    HEENT: No thrush or ulcers. Resp: Breath sounds diminished at the left lung base. No respiratory distress. Cardio: Regular rate and rhythm. GI: Abdomen soft and nontender. No hepatomegaly. Vascular: No leg edema. Skin: No rash.    Lab Results:  Lab Results  Component Value Date   WBC 9.3 01/31/2016   HGB 11.8 01/31/2016   HCT 36.5 01/31/2016   MCV 88.4 01/31/2016   PLT 253  01/31/2016   NEUTROABS 7.3* 01/31/2016    Imaging:  No results found.  Medications: I have reviewed the patient's current medications.  Assessment/Plan: 1. Metastatic non-small cell lung cancer, adenocarcinoma treated with Tarceva 150 mg daily 07/20/2012 through 12/2015; restaging CT scans 01/13/2016 with evidence of disease progression. Found to have T790M mutation. Tagrisso prescribed.   Disposition: Jacqueline Leonard has progressive metastatic non-small cell lung cancer. She experienced disease progression while on Tarceva. EGFR mutation analysis confirms a T790M resistant mutation. Dr. Julien Nordmann recommends treatment with Tagrisso. Potential toxicities including rash, diarrhea, cardiotoxicity were discussed. She is agreeable to proceed. We will obtain a baseline EKG today. We made a referral for a baseline 2-D echo.  For appetite stimulation we prescribed a Medrol Dosepak.  She will return for a follow-up visit and labs in 2 weeks. She will contact the office in the interim with any problems.  Patient seen with Dr. Julien Nordmann.      Ned Card ANP/GNP-BC   01/31/2016  2:46 PM  ADDENDUM: Hematology/Oncology Attending: I had a face to face encounter with the patient today. I recommended her care plan. This is a very pleasant 70 years old Hispanic female with metastatic non-small cell lung cancer, adenocarcinoma with EGFR mutation in exon 70 diagnosed in July 2013 and the patient has been on treatment with Tarceva for more than 3.5 years. Unfortunately she had evidence for disease progression on the recent CT scan of the chest, abdomen and pelvis. I requested blood test for EGFR mutation and showed development of T790M resistant mutation. The patient continues to have significant fatigue and weakness as well as liver  dysfunction secondary to her disease progression. I discussed the lab result with the patient and her family today. I recommended for her to discontinue her treatment with  Tarceva. I recommended for the patient treatment with Tagrisso 80 mg by mouth daily. I discussed with the patient adverse effect of this treatment including but not limited to skin rash, diarrhea, interstitial lung disease as well as cardiac dysfunction. I will arrange for the patient to have EKG as well as 2-D echo before starting the first dose of her treatment with Tagrisso. She would come back for follow-up visit in 2 weeks for reevaluation and repeat blood work for close monitoring of her liver enzymes. The patient was advised to call immediately if she has any concerning symptoms in the interval.  Disclaimer: This note was dictated with voice recognition software. Similar sounding words can inadvertently be transcribed and may be missed upon review.  [name]., MD 01/31/2016

## 2016-01-31 NOTE — Telephone Encounter (Signed)
Gave patient avs report and appointments for March thru May. Echo to [facility] for [place]. Patient aware she will be contacted re echo.

## 2016-01-31 NOTE — Telephone Encounter (Signed)
Called patient and notified her that she will get enrollment information for Tagrisso $25 copay card in the email. She states appreciation. She will not receive an actual card. Enrollment done today by telephone. Her information for copay "card" is as follows:  Member #  [id number]  Grp # [id number] BIN#  [id number] PCN:  [id number]  [facility] outpatient has been given this information.

## 2016-02-01 MED FILL — *TAGRISSO 80MG TABLET: 80 | 30 days supply | Qty: 30 | Fill #0

## 2016-02-02 ENCOUNTER — Encounter: Payer: Self-pay | Admitting: Internal Medicine

## 2016-02-02 ENCOUNTER — Ambulatory Visit (HOSPITAL_BASED_OUTPATIENT_CLINIC_OR_DEPARTMENT_OTHER): Payer: 59

## 2016-02-02 VITALS — BP 106/41 | HR 73 | Temp 98.5°F | Resp 18

## 2016-02-02 DIAGNOSIS — C3432 Malignant neoplasm of lower lobe, left bronchus or lung: Secondary | ICD-10-CM

## 2016-02-02 DIAGNOSIS — C7951 Secondary malignant neoplasm of bone: Secondary | ICD-10-CM | POA: Diagnosis not present

## 2016-02-02 DIAGNOSIS — C799 Secondary malignant neoplasm of unspecified site: Secondary | ICD-10-CM

## 2016-02-02 DIAGNOSIS — C7952 Secondary malignant neoplasm of bone marrow: Principal | ICD-10-CM

## 2016-02-02 MED ORDER — DENOSUMAB 120 MG/1.7ML ~~LOC~~ SOLN
120.0000 mg | Freq: Once | SUBCUTANEOUS | Status: AC
  Administered 2016-02-02: 120 mg via SUBCUTANEOUS
  Filled 2016-02-02: qty 1.7

## 2016-02-02 NOTE — Progress Notes (Signed)
forms left at pod--fax

## 2016-02-03 ENCOUNTER — Encounter: Payer: Self-pay | Admitting: Internal Medicine

## 2016-02-03 ENCOUNTER — Telehealth: Payer: Self-pay | Admitting: Internal Medicine

## 2016-02-03 NOTE — Progress Notes (Signed)
left for dr sign-[employer] form

## 2016-02-03 NOTE — Telephone Encounter (Signed)
Per response from Benedetto Goad [place] for echo. Echo schedule for 3/13 @ 2 pm at [facility]. Called patient and initially left message re echo appointment, then called again and spoke with patient re echo date/time. Patient given other appointments prior to leaving office 01/31/16.

## 2016-02-04 ENCOUNTER — Encounter: Payer: Self-pay | Admitting: Pharmacist

## 2016-02-04 NOTE — Progress Notes (Signed)
Oral Chemotherapy Pharmacist Encounter   I spoke with patient for overview of new oral chemotherapy medication: Tagrisso. Pt is doing well. The prescriptions have been sent to the [facility] outpatient pharmacy for benefit analysis and approval. Tagrisso approved and copay is $25 after copay card applied. Patient started the medication on 02/03/16  Counseled patient on administration, dosing, side effects, safe handling, and monitoring. Side effects include but not limited to: diarrhea, rash, dry skin, upset stomach.  Jacqueline Leonard voiced understanding and appreciation.   All questions answered.  Will follow up in 1 week for adherence and toxicity management.   Thank you,  Montel Clock, PharmD, [facility]

## 2016-02-07 ENCOUNTER — Ambulatory Visit (HOSPITAL_COMMUNITY)
Admission: RE | Admit: 2016-02-07 | Discharge: 2016-02-07 | Disposition: A | Discharge status: Home / Self Care | Payer: 59 | Source: Ambulatory Visit | Attending: Nurse Practitioner | Admitting: Nurse Practitioner

## 2016-02-07 ENCOUNTER — Encounter: Payer: Self-pay | Admitting: Internal Medicine

## 2016-02-07 DIAGNOSIS — C799 Secondary malignant neoplasm of unspecified site: Secondary | ICD-10-CM

## 2016-02-07 DIAGNOSIS — Z85118 Personal history of other malignant neoplasm of bronchus and lung: Secondary | ICD-10-CM | POA: Insufficient documentation

## 2016-02-07 NOTE — Progress Notes (Signed)
sw elaine at [facility] and answered questions on treatment---I advised next appt 02/14/16  Ref#[id number]  [phone]-[employer]life#

## 2016-02-07 NOTE — Progress Notes (Signed)
  Echocardiogram 2D Echocardiogram has been performed.  Jacqueline Leonard 02/07/2016, 2:49 PM

## 2016-02-08 ENCOUNTER — Encounter: Payer: Self-pay | Admitting: Internal Medicine

## 2016-02-08 NOTE — Progress Notes (Signed)
faxed [employer] [phone]

## 2016-02-09 ENCOUNTER — Encounter: Payer: Self-pay | Admitting: Skilled Nursing Facility1

## 2016-02-09 ENCOUNTER — Encounter: Payer: Self-pay | Admitting: Internal Medicine

## 2016-02-09 NOTE — Progress Notes (Signed)
Sent to medical records [facility] faxed 01/05/16

## 2016-02-09 NOTE — Progress Notes (Signed)
Subjective:     Patient ID: Jacqueline Leonard, female   DOB: December 07, 1945, 70 y.o.   MRN: [id number]  HPI   Review of Systems     Objective:   Physical Exam To assist the pt in identifying dietary strategies to gain lost wt.    Assessment:     Pt identified as being malnourished due to lost wt. Pt contacted the pt via the telephone at [phone] and then [phone]. The first number was answered by a gentleman with roken english but Dietitian could understand him perfectly but he had a little trouble understanding the Dietitian, he referred Dietitian to the next number. The next number rang twice and then beeped and then nothing, that number was tried twice.    Plan:     Speak with Ernestene Kiel CSO,RD,LDN.

## 2016-02-14 ENCOUNTER — Encounter: Payer: Self-pay | Admitting: Pharmacist

## 2016-02-14 ENCOUNTER — Other Ambulatory Visit (HOSPITAL_BASED_OUTPATIENT_CLINIC_OR_DEPARTMENT_OTHER): Payer: 59

## 2016-02-14 ENCOUNTER — Telehealth: Payer: Self-pay | Admitting: Internal Medicine

## 2016-02-14 ENCOUNTER — Encounter: Payer: Self-pay | Admitting: Internal Medicine

## 2016-02-14 ENCOUNTER — Ambulatory Visit (HOSPITAL_BASED_OUTPATIENT_CLINIC_OR_DEPARTMENT_OTHER): Payer: 59 | Admitting: Internal Medicine

## 2016-02-14 VITALS — BP 106/39 | HR 70 | Temp 97.7°F | Resp 18 | Ht 62.0 in | Wt 135.3 lb

## 2016-02-14 DIAGNOSIS — E611 Iron deficiency: Secondary | ICD-10-CM

## 2016-02-14 DIAGNOSIS — C3490 Malignant neoplasm of unspecified part of unspecified bronchus or lung: Secondary | ICD-10-CM | POA: Diagnosis not present

## 2016-02-14 DIAGNOSIS — R05 Cough: Secondary | ICD-10-CM

## 2016-02-14 DIAGNOSIS — C799 Secondary malignant neoplasm of unspecified site: Secondary | ICD-10-CM

## 2016-02-14 DIAGNOSIS — C787 Secondary malignant neoplasm of liver and intrahepatic bile duct: Secondary | ICD-10-CM | POA: Diagnosis not present

## 2016-02-14 DIAGNOSIS — C7951 Secondary malignant neoplasm of bone: Secondary | ICD-10-CM

## 2016-02-14 DIAGNOSIS — D630 Anemia in neoplastic disease: Secondary | ICD-10-CM

## 2016-02-14 DIAGNOSIS — C7801 Secondary malignant neoplasm of right lung: Secondary | ICD-10-CM

## 2016-02-14 DIAGNOSIS — Z5111 Encounter for antineoplastic chemotherapy: Secondary | ICD-10-CM | POA: Insufficient documentation

## 2016-02-14 LAB — CBC WITH DIFFERENTIAL/PLATELET
BASO%: 0.4 % (ref 0.0–2.0)
BASOS ABS: 0 10*3/uL (ref 0.0–0.1)
EOS%: 1.9 % (ref 0.0–7.0)
Eosinophils Absolute: 0.1 10*3/uL (ref 0.0–0.5)
HCT: 34.9 % (ref 34.8–46.6)
HEMOGLOBIN: 11.3 g/dL — AB (ref 11.6–15.9)
LYMPH#: 0.8 10*3/uL — AB (ref 0.9–3.3)
LYMPH%: 16.2 % (ref 14.0–49.7)
MCH: 28.9 pg (ref 25.1–34.0)
MCHC: 32.4 g/dL (ref 31.5–36.0)
MCV: 89.3 fL (ref 79.5–101.0)
MONO#: 0.3 10*3/uL (ref 0.1–0.9)
MONO%: 7.3 % (ref 0.0–14.0)
NEUT#: 3.4 10*3/uL (ref 1.5–6.5)
NEUT%: 74.2 % (ref 38.4–76.8)
Platelets: 88 10*3/uL — ABNORMAL LOW (ref 145–400)
RBC: 3.91 10*6/uL (ref 3.70–5.45)
RDW: 18.8 % — ABNORMAL HIGH (ref 11.2–14.5)
WBC: 4.6 10*3/uL (ref 3.9–10.3)
nRBC: 0 % (ref 0–0)

## 2016-02-14 LAB — COMPREHENSIVE METABOLIC PANEL
ALT: 103 U/L — AB (ref 0–55)
AST: 139 U/L — AB (ref 5–34)
Albumin: 2.5 g/dL — ABNORMAL LOW (ref 3.5–5.0)
Alkaline Phosphatase: 660 U/L — ABNORMAL HIGH (ref 40–150)
Anion Gap: 4 mEq/L (ref 3–11)
BUN: 23.6 mg/dL (ref 7.0–26.0)
CHLORIDE: 108 meq/L (ref 98–109)
CO2: 27 meq/L (ref 22–29)
CREATININE: 0.9 mg/dL (ref 0.6–1.1)
Calcium: 8.4 mg/dL (ref 8.4–10.4)
EGFR: 62 mL/min/{1.73_m2} — ABNORMAL LOW (ref >90)
Glucose: 110 mg/dl (ref 70–140)
POTASSIUM: 3.9 meq/L (ref 3.5–5.1)
SODIUM: 140 meq/L (ref 136–145)
Total Bilirubin: 2.31 mg/dL — ABNORMAL HIGH (ref 0.20–1.20)
Total Protein: 6.7 g/dL (ref 6.4–8.3)

## 2016-02-14 NOTE — Progress Notes (Signed)
Oral Chemotherapy Follow-Up Form  Original Start date of oral chemotherapy: 02/03/16   Called patient today to follow up regarding patient's oral chemotherapy medication: Tagrisso  Pt is doing well today.  She saw Dr. Julien Nordmann earlier in the day today.  She has lost some weight. LFT's improved but still elevated.  No adjustments to dose of Tagrisso made.  Pt reports 0 tablets/doses missed in the last 2 weeks. She sets an alarm for 8 am every day and that is how she remembers to take her dose.  One hour after taking her Tagrisso, she drinks a glass of milk.  No trouble swallowing tablets.   Pt reports (correctly) that she takes 1 x 80 mg tablet daily.  Pt reports the following side effects: Abdominal "fullness" after eating.  Duration: 30 minutes.  Improves w/ walking.  Other Issues: Dry Skin.  Location: arms & legs.  No rash.  Pt uses lotion every day.  Energy level is "good." No diarrhea, no SOB. She weighs herself every day to track fluid retention.  No significant weight gain.  No LE edema today on exam per Dr. Julien Nordmann. EF wnl prior to starting [place].   Will follow up and call patient again in 1 week.   Thank you,  Kennith Center, Pharm.D., CPP 02/14/2016'@4'$ :15 PM Oral Chemotherapy Clinic

## 2016-02-14 NOTE — Progress Notes (Signed)
[place] [phone]   Fax:(336) [phone]  OFFICE PROGRESS NOTE  CLOWARD,DAVIS L, MD [address] [place] [address]  DIAGNOSIS: Metastatic non-small cell lung cancer, adenocarcinoma with positive EGFR mutation in exon 19 and negative ALK gene translocation diagnosed in July 2013 with bone metastasis. Now with resistant T790M mutation.  PRIOR THERAPY:  1) Status post radiotherapy to the left face rib metastasis under the care of Dr. Lisbeth Renshaw.  2) stereotactic radiotherapy to the enlarging left lower lobe lung nodule under the care of Dr. Lisbeth Renshaw. 3) treatment with Tarceva 150 mg by mouth daily, therapy beginning 07/20/2012. Status post approximately 36 months of therapy. This was discontinued secondary to disease progression  CURRENT THERAPY:  1) Tagrisso 80 mg by mouth daily started 02/03/2016. 2) Xgeva 120 mg subcutaneously every 4 weeks for bone disease.   CHEMOTHERAPY INTENT: Palliative  CURRENT # OF CHEMOTHERAPY CYCLES: 1 CURRENT ANTIEMETICS: Compazine  CURRENT SMOKING STATUS: Never smoker  ORAL CHEMOTHERAPY AND CONSENT: Tarceva  CURRENT BISPHOSPHONATES USE: Xgeva  PAIN MANAGEMENT: None  NARCOTICS INDUCED CONSTIPATION: None  LIVING WILL AND CODE STATUS: Full code  INTERVAL HISTORY: Jacqueline Leonard 70 y.o. female returns to the clinic today for monthly followup visit accompanied by her husband. The patient was recently started on treatment with Tagrisso 80 mg by mouth daily and she is tolerating it fairly well with no significant adverse effects. She denied having any significant skin rash or diarrhea. She has no fever or chills, no nausea or vomiting. She has no weight loss or night sweats. The patient denied having any significant chest pain, shortness of breath, cough or hemoptysis. She is here today for evaluation with repeat blood work.  MEDICAL HISTORY: Past Medical History  Diagnosis Date  . Lung mass   . Hypertension   .  Hyperlipidemia   . Vitamin D deficiency   . Radiation 07/11/12-07/24/12    Palliative lung tx 30 gray in 10 fx  . Status post chemoradiation     Tarceva  . External hemorrhoids   . Allergy   . Anxiety   . Cancer, metastatic to bone ([place]) 06/19/12    bx=L 5th ribmetastatic Adenocarcinoma with known lung mass  . S/P radiation therapy 07/22/14-07/31/14    left lung/60gy/109f    ALLERGIES:  is allergic to penicillins.  MEDICATIONS:  Current Outpatient Prescriptions  Medication Sig Dispense Refill  . amLODipine (NORVASC) 5 MG tablet Take 5 mg by mouth daily.    . Clobetasol Propionate 0.05 % shampoo Reported on 01/05/2016    . fluocinonide (LIDEX) 0.05 % external solution Reported on 01/05/2016    . HYDROcodone-homatropine (HYCODAN) 5-1.5 MG/5ML syrup Take 5 mLs by mouth every 6 (six) hours as needed for cough. 120 mL 0  . hydrocortisone 2.5 % cream Reported on 01/05/2016    . Multiple Minerals-Vitamins (CALCIUM & VIT D3 BONE HEALTH PO) Take 750 mg by mouth 2 (two) times daily.    .Marland Kitchenosimertinib mesylate (TAGRISSO) 80 MG tablet Take 1 tablet (80 mg total) by mouth daily. 30 tablet 2  . potassium chloride (K-DUR) 10 MEQ tablet Take 1 tablet by mouth daily.    . valsartan-hydrochlorothiazide (DIOVAN-HCT) 160-25 MG per tablet Take 1 tablet by mouth daily.    . cholecalciferol (VITAMIN D) 1000 UNITS tablet Take 1,000 Units by mouth daily.     No current facility-administered medications for this visit.    SURGICAL HISTORY:  Past Surgical History  Procedure Laterality Date  .  Soft tissue biopsy  06/19/12    L 5th rib=metastatic adenocarcinoma  . Ovarian cyst removal    . Appendectomy      REVIEW OF SYSTEMS:  A comprehensive review of systems was negative except for: Constitutional: positive for fatigue   PHYSICAL EXAMINATION: General appearance: alert, cooperative and no distress Head: Normocephalic, without obvious abnormality, atraumatic Neck: no adenopathy, no JVD, supple, symmetrical,  trachea midline and thyroid not enlarged, symmetric, no tenderness/mass/nodules Lymph nodes: Cervical, supraclavicular, and axillary nodes normal. Resp: clear to auscultation bilaterally Back: symmetric, no curvature. ROM normal. No CVA tenderness. Cardio: regular rate and rhythm, S1, S2 normal, no murmur, click, rub or gallop GI: soft, non-tender; bowel sounds normal; no masses,  no organomegaly Extremities: extremities normal, atraumatic, no cyanosis or edema Neurologic: Alert and oriented X 3, normal strength and tone. Normal symmetric reflexes. Normal coordination and gait  ECOG PERFORMANCE STATUS: 1 - Symptomatic but completely ambulatory  Blood pressure 106/39, pulse 70, temperature 97.7 F (36.5 C), temperature source Oral, resp. rate 18, height 5' 2"  (1.575 m), weight 135 lb 4.8 oz (61.372 kg), SpO2 100 %.  LABORATORY DATA: Lab Results  Component Value Date   WBC 4.6 02/14/2016   HGB 11.3* 02/14/2016   HCT 34.9 02/14/2016   MCV 89.3 02/14/2016   PLT 88 Platelet count confirmed by slide estimate* 02/14/2016      Chemistry      Component Value Date/Time   NA 140 02/14/2016 1025   NA 136 09/23/2012 1525   K 3.9 02/14/2016 1025   K 4.0 09/23/2012 1525   CL 102 05/06/2013 1511   CL 101 09/23/2012 1525   CO2 27 02/14/2016 1025   CO2 26 09/23/2012 1525   BUN 23.6 02/14/2016 1025   BUN 30* 09/23/2012 1525   CREATININE 0.9 02/14/2016 1025   CREATININE 0.84 09/23/2012 1525      Component Value Date/Time   CALCIUM 8.4 02/14/2016 1025   CALCIUM 8.9 09/23/2012 1525   ALKPHOS 660* 02/14/2016 1025   ALKPHOS 52 09/23/2012 1525   AST 139* 02/14/2016 1025   AST 24 09/23/2012 1525   ALT 103* 02/14/2016 1025   ALT 31 09/23/2012 1525   BILITOT 2.31* 02/14/2016 1025   BILITOT 0.8 09/23/2012 1525       RADIOGRAPHIC STUDIES: No results found. ASSESSMENT AND PLAN: This is a very pleasant 70 years old Hispanic female with metastatic non-small cell lung cancer, adenocarcinoma  currently on treatment with Tarceva 150 mg by mouth daily status post 36 months of treatment and tolerating it fairly well. She is currently off treatment with Tarceva since 01/05/2016. Her recent CT scan of the chest, abdomen and pelvis showed evidence for significant disease progression in the lungs as well as liver. Her comprehensive metabolic panel also showed liver dysfunction consistent with her disease progression in the liver. Her brain MRI showed no concerning finding for disease progression in the brain. The molecular study showed T790M resistant mutation The patient was started on treatment with Tagrisso 80 mg by mouth daily on 02/03/2016. She is tolerating her treatment fairly well. I recommended for the patient to continue her treatment with Tagrisso. I will continue to monitor her blood work and liver enzymes closely with follow-up visit in 2 weeks. For the metastatic bone disease, she will continue on Xgeva as previously scheduled. For the dry cough, I gave the patient prescription for Hycodan 5 ML by mouth every 6 hours as needed. For the iron deficiency, the patient will continue on  over the counter ferrous sulfate. She was advised to call immediately if she has any concerning symptoms in the interval. The patient voices understanding of current disease status and treatment options and is in agreement with the current care plan.  All questions were answered. The patient knows to call the clinic with any problems, questions or concerns. We can certainly see the patient much sooner if necessary.  Disclaimer: This note was dictated with voice recognition software. Similar sounding words can inadvertently be transcribed and may not be corrected upon review.

## 2016-02-14 NOTE — Telephone Encounter (Signed)
Scheduled patient appt per office note 2 weeks, avs report printed.

## 2016-02-25 ENCOUNTER — Telehealth: Payer: Self-pay | Admitting: Internal Medicine

## 2016-02-25 NOTE — Telephone Encounter (Signed)
returned call to [facility] [place] [phone] to r/s pt appt....ok and aware of new d.t

## 2016-02-29 MED FILL — *TAGRISSO 80MG TABLET: 80 | 30 days supply | Qty: 30 | Fill #1

## 2016-03-01 ENCOUNTER — Ambulatory Visit: Payer: 59

## 2016-03-01 ENCOUNTER — Ambulatory Visit: Payer: 59 | Admitting: Internal Medicine

## 2016-03-03 ENCOUNTER — Telehealth: Payer: Self-pay | Admitting: Internal Medicine

## 2016-03-03 NOTE — Telephone Encounter (Signed)
PM PAL - moved 4/11 f/u and injection to 4/12 @ 3:15 pm. Spoke with patient she is aware.

## 2016-03-07 ENCOUNTER — Ambulatory Visit: Payer: 59

## 2016-03-07 ENCOUNTER — Telehealth: Payer: Self-pay | Admitting: Internal Medicine

## 2016-03-07 ENCOUNTER — Other Ambulatory Visit: Payer: Self-pay | Admitting: Medical Oncology

## 2016-03-07 ENCOUNTER — Ambulatory Visit: Payer: 59 | Admitting: Internal Medicine

## 2016-03-07 DIAGNOSIS — C799 Secondary malignant neoplasm of unspecified site: Secondary | ICD-10-CM

## 2016-03-07 NOTE — Telephone Encounter (Signed)
s.w. pt and advised on April appt.....pt ok and aware °

## 2016-03-08 ENCOUNTER — Ambulatory Visit (HOSPITAL_BASED_OUTPATIENT_CLINIC_OR_DEPARTMENT_OTHER): Payer: 59 | Admitting: Internal Medicine

## 2016-03-08 ENCOUNTER — Other Ambulatory Visit (HOSPITAL_BASED_OUTPATIENT_CLINIC_OR_DEPARTMENT_OTHER): Payer: 59

## 2016-03-08 ENCOUNTER — Ambulatory Visit (HOSPITAL_BASED_OUTPATIENT_CLINIC_OR_DEPARTMENT_OTHER): Payer: 59

## 2016-03-08 ENCOUNTER — Encounter: Payer: Self-pay | Admitting: Internal Medicine

## 2016-03-08 VITALS — BP 120/50 | HR 76 | Temp 97.6°F | Resp 18 | Ht 62.0 in | Wt 137.3 lb

## 2016-03-08 DIAGNOSIS — C799 Secondary malignant neoplasm of unspecified site: Secondary | ICD-10-CM

## 2016-03-08 DIAGNOSIS — C7951 Secondary malignant neoplasm of bone: Secondary | ICD-10-CM

## 2016-03-08 DIAGNOSIS — E611 Iron deficiency: Secondary | ICD-10-CM

## 2016-03-08 DIAGNOSIS — C7800 Secondary malignant neoplasm of unspecified lung: Secondary | ICD-10-CM

## 2016-03-08 DIAGNOSIS — Z5111 Encounter for antineoplastic chemotherapy: Secondary | ICD-10-CM

## 2016-03-08 DIAGNOSIS — R05 Cough: Secondary | ICD-10-CM

## 2016-03-08 DIAGNOSIS — C3490 Malignant neoplasm of unspecified part of unspecified bronchus or lung: Secondary | ICD-10-CM

## 2016-03-08 DIAGNOSIS — C787 Secondary malignant neoplasm of liver and intrahepatic bile duct: Secondary | ICD-10-CM

## 2016-03-08 DIAGNOSIS — C7952 Secondary malignant neoplasm of bone marrow: Principal | ICD-10-CM

## 2016-03-08 DIAGNOSIS — C7801 Secondary malignant neoplasm of right lung: Secondary | ICD-10-CM

## 2016-03-08 LAB — COMPREHENSIVE METABOLIC PANEL
ALBUMIN: 2.8 g/dL — AB (ref 3.5–5.0)
ALT: 59 U/L — AB (ref 0–55)
AST: 65 U/L — AB (ref 5–34)
Alkaline Phosphatase: 230 U/L — ABNORMAL HIGH (ref 40–150)
Anion Gap: 6 mEq/L (ref 3–11)
BUN: 23.7 mg/dL (ref 7.0–26.0)
CALCIUM: 8.7 mg/dL (ref 8.4–10.4)
CO2: 25 mEq/L (ref 22–29)
CREATININE: 0.9 mg/dL (ref 0.6–1.1)
Chloride: 109 mEq/L (ref 98–109)
EGFR: 62 mL/min/{1.73_m2} — ABNORMAL LOW (ref >90)
GLUCOSE: 129 mg/dL (ref 70–140)
POTASSIUM: 4.3 meq/L (ref 3.5–5.1)
SODIUM: 140 meq/L (ref 136–145)
Total Bilirubin: 0.72 mg/dL (ref 0.20–1.20)
Total Protein: 7.2 g/dL (ref 6.4–8.3)

## 2016-03-08 LAB — CBC WITH DIFFERENTIAL/PLATELET
BASO%: 0.5 % (ref 0.0–2.0)
BASOS ABS: 0 10*3/uL (ref 0.0–0.1)
EOS%: 3.6 % (ref 0.0–7.0)
Eosinophils Absolute: 0.2 10*3/uL (ref 0.0–0.5)
HEMATOCRIT: 32.2 % — AB (ref 34.8–46.6)
HEMOGLOBIN: 10.3 g/dL — AB (ref 11.6–15.9)
LYMPH#: 0.7 10*3/uL — AB (ref 0.9–3.3)
LYMPH%: 15.9 % (ref 14.0–49.7)
MCH: 29 pg (ref 25.1–34.0)
MCHC: 32.1 g/dL (ref 31.5–36.0)
MCV: 90.5 fL (ref 79.5–101.0)
MONO#: 0.4 10*3/uL (ref 0.1–0.9)
MONO%: 10.1 % (ref 0.0–14.0)
NEUT%: 69.9 % (ref 38.4–76.8)
NEUTROS ABS: 2.9 10*3/uL (ref 1.5–6.5)
Platelets: 74 10*3/uL — ABNORMAL LOW (ref 145–400)
RBC: 3.56 10*6/uL — ABNORMAL LOW (ref 3.70–5.45)
RDW: 16.6 % — AB (ref 11.2–14.5)
WBC: 4.2 10*3/uL (ref 3.9–10.3)

## 2016-03-08 MED ORDER — DENOSUMAB 120 MG/1.7ML ~~LOC~~ SOLN
120.0000 mg | Freq: Once | SUBCUTANEOUS | Status: AC
  Administered 2016-03-08: 120 mg via SUBCUTANEOUS
  Filled 2016-03-08: qty 1.7

## 2016-03-08 NOTE — Progress Notes (Signed)
[place] [phone]   Fax:(336) [phone]  OFFICE PROGRESS NOTE  CLOWARD,DAVIS L, MD [address] [place] [address]  DIAGNOSIS: Metastatic non-small cell lung cancer, adenocarcinoma with positive EGFR mutation in exon 19 and negative ALK gene translocation diagnosed in July 2013 with bone metastasis. Now with resistant T790M mutation.  PRIOR THERAPY:  1) Status post radiotherapy to the left face rib metastasis under the care of Dr. Lisbeth Renshaw.  2) stereotactic radiotherapy to the enlarging left lower lobe lung nodule under the care of Dr. Lisbeth Renshaw. 3) treatment with Tarceva 150 mg by mouth daily, therapy beginning 07/20/2012. Status post approximately 36 months of therapy. This was discontinued secondary to disease progression  CURRENT THERAPY:  1) Tagrisso 80 mg by mouth daily started 02/03/2016. Status post 1 months of treatment. 2) Xgeva 120 mg subcutaneously every 4 weeks for bone disease.   CHEMOTHERAPY INTENT: Palliative  CURRENT # OF CHEMOTHERAPY CYCLES: 2 CURRENT ANTIEMETICS: Compazine  CURRENT SMOKING STATUS: Never smoker  ORAL CHEMOTHERAPY AND CONSENT: Tarceva  CURRENT BISPHOSPHONATES USE: Xgeva  PAIN MANAGEMENT: None  NARCOTICS INDUCED CONSTIPATION: None  LIVING WILL AND CODE STATUS: Full code  INTERVAL HISTORY: Jacqueline Leonard 70 y.o. female returns to the clinic today for monthly followup visit accompanied by her husband. The patient is currently on treatment with Tagrisso 80 mg by mouth daily status post 1 months of treatment and she is tolerating it fairly well with no significant adverse effects. She has improvement in her general condition after starting the treatment. She gained few pounds since her last visit 2 weeks ago. She denied having any significant skin rash or diarrhea. She has no fever or chills, no nausea or vomiting. The patient denied having any significant chest pain, shortness of breath, cough or hemoptysis. She is  here today for evaluation with repeat blood work.  MEDICAL HISTORY: Past Medical History  Diagnosis Date  . Lung mass   . Hypertension   . Hyperlipidemia   . Vitamin D deficiency   . Radiation 07/11/12-07/24/12    Palliative lung tx 30 gray in 10 fx  . Status post chemoradiation     Tarceva  . External hemorrhoids   . Allergy   . Anxiety   . Cancer, metastatic to bone ([place]) 06/19/12    bx=L 5th ribmetastatic Adenocarcinoma with known lung mass  . S/P radiation therapy 07/22/14-07/31/14    left lung/60gy/63f  . Encounter for antineoplastic chemotherapy 02/14/2016    ALLERGIES:  is allergic to penicillins.  MEDICATIONS:  Current Outpatient Prescriptions  Medication Sig Dispense Refill  . amLODipine (NORVASC) 5 MG tablet Take 5 mg by mouth daily.    . cholecalciferol (VITAMIN D) 1000 UNITS tablet Take 1,000 Units by mouth daily.    . Clobetasol Propionate 0.05 % shampoo Reported on 01/05/2016    . fluocinonide (LIDEX) 0.05 % external solution Reported on 01/05/2016    . HYDROcodone-homatropine (HYCODAN) 5-1.5 MG/5ML syrup Take 5 mLs by mouth every 6 (six) hours as needed for cough. 120 mL 0  . hydrocortisone 2.5 % cream Reported on 01/05/2016    . Multiple Minerals-Vitamins (CALCIUM & VIT D3 BONE HEALTH PO) Take 750 mg by mouth 2 (two) times daily.    .Marland Kitchenosimertinib mesylate (TAGRISSO) 80 MG tablet Take 1 tablet (80 mg total) by mouth daily. 30 tablet 2  . potassium chloride (K-DUR) 10 MEQ tablet Take 1 tablet by mouth daily.    .Marland KitchenTAGRISSO 80 MG tablet  2  . valsartan-hydrochlorothiazide (DIOVAN-HCT) 160-25 MG per tablet Take 1 tablet by mouth daily.     No current facility-administered medications for this visit.   Facility-Administered Medications Ordered in Other Visits  Medication Dose Route Frequency Provider Last Rate Last Dose  . denosumab (XGEVA) injection 120 mg  120 mg Subcutaneous Once Curt Bears, MD        SURGICAL HISTORY:  Past Surgical History  Procedure  Laterality Date  . Soft tissue biopsy  06/19/12    L 5th rib=metastatic adenocarcinoma  . Ovarian cyst removal    . Appendectomy      REVIEW OF SYSTEMS:  A comprehensive review of systems was negative except for: Constitutional: positive for fatigue   PHYSICAL EXAMINATION: General appearance: alert, cooperative and no distress Head: Normocephalic, without obvious abnormality, atraumatic Neck: no adenopathy, no JVD, supple, symmetrical, trachea midline and thyroid not enlarged, symmetric, no tenderness/mass/nodules Lymph nodes: Cervical, supraclavicular, and axillary nodes normal. Resp: clear to auscultation bilaterally Back: symmetric, no curvature. ROM normal. No CVA tenderness. Cardio: regular rate and rhythm, S1, S2 normal, no murmur, click, rub or gallop GI: soft, non-tender; bowel sounds normal; no masses,  no organomegaly Extremities: extremities normal, atraumatic, no cyanosis or edema Neurologic: Alert and oriented X 3, normal strength and tone. Normal symmetric reflexes. Normal coordination and gait  ECOG PERFORMANCE STATUS: 1 - Symptomatic but completely ambulatory  Blood pressure 120/50, pulse 76, temperature 97.6 F (36.4 C), temperature source Oral, resp. rate 18, height 5' 2"  (1.575 m), weight 137 lb 4.8 oz (62.279 kg), SpO2 99 %.  LABORATORY DATA: Lab Results  Component Value Date   WBC 4.2 03/08/2016   HGB 10.3* 03/08/2016   HCT 32.2* 03/08/2016   MCV 90.5 03/08/2016   PLT 74* 03/08/2016      Chemistry      Component Value Date/Time   NA 140 03/08/2016 1442   NA 136 09/23/2012 1525   K 4.3 03/08/2016 1442   K 4.0 09/23/2012 1525   CL 102 05/06/2013 1511   CL 101 09/23/2012 1525   CO2 25 03/08/2016 1442   CO2 26 09/23/2012 1525   BUN 23.7 03/08/2016 1442   BUN 30* 09/23/2012 1525   CREATININE 0.9 03/08/2016 1442   CREATININE 0.84 09/23/2012 1525      Component Value Date/Time   CALCIUM 8.7 03/08/2016 1442   CALCIUM 8.9 09/23/2012 1525   ALKPHOS 230*  03/08/2016 1442   ALKPHOS 52 09/23/2012 1525   AST 65* 03/08/2016 1442   AST 24 09/23/2012 1525   ALT 59* 03/08/2016 1442   ALT 31 09/23/2012 1525   BILITOT 0.72 03/08/2016 1442   BILITOT 0.8 09/23/2012 1525       RADIOGRAPHIC STUDIES: No results found. ASSESSMENT AND PLAN: This is a very pleasant 70 years old Hispanic female with metastatic non-small cell lung cancer, adenocarcinoma currently on treatment with Tarceva 150 mg by mouth daily status post 36 months of treatment and tolerating it fairly well. She is currently off treatment with Tarceva since 01/05/2016. Her recent CT scan of the chest, abdomen and pelvis showed evidence for significant disease progression in the lungs as well as liver. Her comprehensive metabolic panel also showed liver dysfunction consistent with her disease progression in the liver. Her brain MRI showed no concerning finding for disease progression in the brain. The molecular study showed T790M resistant mutation The patient was started on treatment with Tagrisso 80 mg by mouth daily on 02/03/2016. Status post 1 month of treatment.  She is tolerating her treatment fairly well. She has improvement in her general condition and also improvement in her liver enzyme after starting the treatment. I recommended for the patient to continue her treatment with Tagrisso. I would see her back for follow-up visit in one month for reevaluation with repeat CT scan of the chest, abdomen and pelvis for restaging of her disease. For the metastatic bone disease, she will continue on Xgeva as previously scheduled. For the dry cough, she will continue on Hycodan 5 ML by mouth every 6 hours as needed. For the iron deficiency, the patient will continue on over the counter ferrous sulfate. She was advised to call immediately if she has any concerning symptoms in the interval. The patient voices understanding of current disease status and treatment options and is in agreement with the  current care plan.  All questions were answered. The patient knows to call the clinic with any problems, questions or concerns. We can certainly see the patient much sooner if necessary.  Disclaimer: This note was dictated with voice recognition software. Similar sounding words can inadvertently be transcribed and may not be corrected upon review.

## 2016-03-24 ENCOUNTER — Telehealth: Payer: Self-pay | Admitting: Internal Medicine

## 2016-03-24 ENCOUNTER — Other Ambulatory Visit: Payer: Self-pay | Admitting: Medical Oncology

## 2016-03-24 NOTE — Telephone Encounter (Signed)
s.w. pt and advised on 5.3 appt cx and injection is after MD visit on 5.16...pt ok and aware

## 2016-03-27 MED FILL — *TAGRISSO 80MG TABLET: 80 | 30 days supply | Qty: 30 | Fill #2

## 2016-03-29 ENCOUNTER — Other Ambulatory Visit: Payer: 59

## 2016-03-29 ENCOUNTER — Ambulatory Visit: Payer: 59

## 2016-03-31 ENCOUNTER — Encounter: Payer: Self-pay | Admitting: *Deleted

## 2016-03-31 ENCOUNTER — Other Ambulatory Visit: Payer: Self-pay | Admitting: *Deleted

## 2016-03-31 DIAGNOSIS — C799 Secondary malignant neoplasm of unspecified site: Secondary | ICD-10-CM

## 2016-03-31 NOTE — Progress Notes (Signed)
Oncology Nurse Navigator Documentation  Oncology Nurse Navigator Flowsheets 03/31/2016  Navigator Encounter Type Other  Patient Visit Type Other  Treatment Phase Treatment  Barriers/Navigation Needs Coordination of Care  Interventions Coordination of Care  Coordination of Care Other  Acuity Level 3  Time Spent with Patient 53   Patient's CT Chest/Abd/Pelvis was denied.  I spoke with Dr. Julien Nordmann, orders received.  I called insurance company to inquire about denial.  I clarified and CT Chest and Abd approved.  Received verbal order from Dr. Julien Nordmann and changed order in computer.  I called central scheduling to update and order changed.

## 2016-04-04 ENCOUNTER — Ambulatory Visit (HOSPITAL_COMMUNITY)
Admission: RE | Admit: 2016-04-04 | Discharge: 2016-04-04 | Disposition: A | Discharge status: Home / Self Care | Payer: 59 | Source: Ambulatory Visit | Attending: Internal Medicine | Admitting: Internal Medicine

## 2016-04-04 ENCOUNTER — Other Ambulatory Visit (HOSPITAL_BASED_OUTPATIENT_CLINIC_OR_DEPARTMENT_OTHER): Payer: 59

## 2016-04-04 DIAGNOSIS — C7951 Secondary malignant neoplasm of bone: Secondary | ICD-10-CM | POA: Insufficient documentation

## 2016-04-04 DIAGNOSIS — C799 Secondary malignant neoplasm of unspecified site: Secondary | ICD-10-CM | POA: Diagnosis present

## 2016-04-04 DIAGNOSIS — N1330 Unspecified hydronephrosis: Secondary | ICD-10-CM | POA: Insufficient documentation

## 2016-04-04 DIAGNOSIS — K760 Fatty (change of) liver, not elsewhere classified: Secondary | ICD-10-CM | POA: Insufficient documentation

## 2016-04-04 DIAGNOSIS — C7801 Secondary malignant neoplasm of right lung: Secondary | ICD-10-CM | POA: Insufficient documentation

## 2016-04-04 DIAGNOSIS — Z5111 Encounter for antineoplastic chemotherapy: Secondary | ICD-10-CM

## 2016-04-04 DIAGNOSIS — Z923 Personal history of irradiation: Secondary | ICD-10-CM | POA: Insufficient documentation

## 2016-04-04 DIAGNOSIS — I7090 Unspecified atherosclerosis: Secondary | ICD-10-CM | POA: Diagnosis not present

## 2016-04-04 DIAGNOSIS — C3490 Malignant neoplasm of unspecified part of unspecified bronchus or lung: Secondary | ICD-10-CM

## 2016-04-04 DIAGNOSIS — I517 Cardiomegaly: Secondary | ICD-10-CM | POA: Diagnosis not present

## 2016-04-04 DIAGNOSIS — J90 Pleural effusion, not elsewhere classified: Secondary | ICD-10-CM | POA: Insufficient documentation

## 2016-04-04 LAB — COMPREHENSIVE METABOLIC PANEL
ALBUMIN: 3.1 g/dL — AB (ref 3.5–5.0)
ALK PHOS: 103 U/L (ref 40–150)
ALT: 46 U/L (ref 0–55)
ANION GAP: 5 meq/L (ref 3–11)
AST: 46 U/L — ABNORMAL HIGH (ref 5–34)
BUN: 30.4 mg/dL — ABNORMAL HIGH (ref 7.0–26.0)
CALCIUM: 9.3 mg/dL (ref 8.4–10.4)
CO2: 27 mEq/L (ref 22–29)
Chloride: 106 mEq/L (ref 98–109)
Creatinine: 0.8 mg/dL (ref 0.6–1.1)
EGFR: 73 mL/min/{1.73_m2} — AB (ref >90)
GLUCOSE: 86 mg/dL (ref 70–140)
POTASSIUM: 4.5 meq/L (ref 3.5–5.1)
SODIUM: 138 meq/L (ref 136–145)
Total Bilirubin: 0.59 mg/dL (ref 0.20–1.20)
Total Protein: 7.8 g/dL (ref 6.4–8.3)

## 2016-04-04 LAB — CBC WITH DIFFERENTIAL/PLATELET
BASO%: 0.2 % (ref 0.0–2.0)
BASOS ABS: 0 10*3/uL (ref 0.0–0.1)
EOS ABS: 0.1 10*3/uL (ref 0.0–0.5)
EOS%: 2.2 % (ref 0.0–7.0)
HCT: 31.9 % — ABNORMAL LOW (ref 34.8–46.6)
HEMOGLOBIN: 10.3 g/dL — AB (ref 11.6–15.9)
LYMPH#: 0.9 10*3/uL (ref 0.9–3.3)
LYMPH%: 19.7 % (ref 14.0–49.7)
MCH: 29.1 pg (ref 25.1–34.0)
MCHC: 32.3 g/dL (ref 31.5–36.0)
MCV: 90.1 fL (ref 79.5–101.0)
MONO#: 0.5 10*3/uL (ref 0.1–0.9)
MONO%: 11.1 % (ref 0.0–14.0)
NEUT#: 3 10*3/uL (ref 1.5–6.5)
NEUT%: 66.8 % (ref 38.4–76.8)
Platelets: 74 10*3/uL — ABNORMAL LOW (ref 145–400)
RBC: 3.54 10*6/uL — ABNORMAL LOW (ref 3.70–5.45)
RDW: 14.7 % — AB (ref 11.2–14.5)
WBC: 4.5 10*3/uL (ref 3.9–10.3)

## 2016-04-04 MED ORDER — IOPAMIDOL (ISOVUE-300) INJECTION 61%
100.0000 mL | Freq: Once | INTRAVENOUS | Status: AC | PRN
  Administered 2016-04-04: 100 mL via INTRAVENOUS

## 2016-04-11 ENCOUNTER — Ambulatory Visit (HOSPITAL_BASED_OUTPATIENT_CLINIC_OR_DEPARTMENT_OTHER): Payer: 59

## 2016-04-11 ENCOUNTER — Ambulatory Visit (HOSPITAL_BASED_OUTPATIENT_CLINIC_OR_DEPARTMENT_OTHER): Payer: 59 | Admitting: Internal Medicine

## 2016-04-11 ENCOUNTER — Encounter: Payer: Self-pay | Admitting: Internal Medicine

## 2016-04-11 ENCOUNTER — Other Ambulatory Visit: Payer: 59

## 2016-04-11 ENCOUNTER — Telehealth: Payer: Self-pay | Admitting: Internal Medicine

## 2016-04-11 VITALS — BP 126/43 | HR 78 | Temp 97.7°F | Resp 18 | Ht 62.0 in | Wt 138.9 lb

## 2016-04-11 DIAGNOSIS — C3432 Malignant neoplasm of lower lobe, left bronchus or lung: Secondary | ICD-10-CM

## 2016-04-11 DIAGNOSIS — C7951 Secondary malignant neoplasm of bone: Secondary | ICD-10-CM | POA: Insufficient documentation

## 2016-04-11 DIAGNOSIS — R05 Cough: Secondary | ICD-10-CM

## 2016-04-11 DIAGNOSIS — D630 Anemia in neoplastic disease: Secondary | ICD-10-CM

## 2016-04-11 DIAGNOSIS — E611 Iron deficiency: Secondary | ICD-10-CM

## 2016-04-11 DIAGNOSIS — Z5111 Encounter for antineoplastic chemotherapy: Secondary | ICD-10-CM

## 2016-04-11 DIAGNOSIS — C799 Secondary malignant neoplasm of unspecified site: Secondary | ICD-10-CM

## 2016-04-11 MED ORDER — DENOSUMAB 120 MG/1.7ML ~~LOC~~ SOLN
120.0000 mg | Freq: Once | SUBCUTANEOUS | Status: AC
  Administered 2016-04-11: 120 mg via SUBCUTANEOUS
  Filled 2016-04-11: qty 1.7

## 2016-04-11 NOTE — Progress Notes (Signed)
[place] [phone]   [phone]  OFFICE PROGRESS NOTE  CLOWARD,DAVIS L, MD [address] [place] [address]  DIAGNOSIS: Metastatic non-small cell lung cancer, adenocarcinoma with positive EGFR mutation in exon 19 and negative ALK gene translocation diagnosed in July 2013 with bone metastasis. Now with resistant T790M mutation.  PRIOR THERAPY:  1) Status post radiotherapy to the left face rib metastasis under the care of Dr. Lisbeth Renshaw.  2) stereotactic radiotherapy to the enlarging left lower lobe lung nodule under the care of Dr. Lisbeth Renshaw. 3) treatment with Tarceva 150 mg by mouth daily, therapy beginning 07/20/2012. Status post approximately 36 months of therapy. This was discontinued secondary to disease progression  CURRENT THERAPY:  1) Tagrisso 80 mg by mouth daily started 02/03/2016. Status post 2 months of treatment. 2) Xgeva 120 mg subcutaneously every 4 weeks for bone disease.   CHEMOTHERAPY INTENT: Palliative  CURRENT # OF CHEMOTHERAPY CYCLES: 3 CURRENT ANTIEMETICS: Compazine  CURRENT SMOKING STATUS: Never smoker  ORAL CHEMOTHERAPY AND CONSENT: Tarceva  CURRENT BISPHOSPHONATES USE: Xgeva  PAIN MANAGEMENT: None  NARCOTICS INDUCED CONSTIPATION: None  LIVING WILL AND CODE STATUS: Full code  INTERVAL HISTORY: Jacqueline Leonard 70 y.o. female returns to the clinic today for monthly followup visit accompanied by her 2 neices. The patient is currently on treatment with Tagrisso 80 mg by mouth daily status post 2 months of treatment and she is tolerating it fairly well with no significant adverse effects. She continues to have improvement of her condition and she gained 2 more pounds since her last visit. She denied having any significant skin rash or diarrhea. She has no fever or chills, no nausea or vomiting. The patient denied having any significant chest pain, shortness of breath, cough or hemoptysis. She had repeat CT scan of the chest  abdomen and pelvis performed recently and she is here for evaluation and discussion of her scan results.  MEDICAL HISTORY: Past Medical History  Diagnosis Date  . Lung mass   . Hypertension   . Hyperlipidemia   . Vitamin D deficiency   . Radiation 07/11/12-07/24/12    Palliative lung tx 30 gray in 10 fx  . Status post chemoradiation     Tarceva  . External hemorrhoids   . Allergy   . Anxiety   . Cancer, metastatic to bone ([facility]) 06/19/12    bx=L 5th ribmetastatic Adenocarcinoma with known lung mass  . S/P radiation therapy 07/22/14-07/31/14    left lung/60gy/73f  . Encounter for antineoplastic chemotherapy 02/14/2016    ALLERGIES:  is allergic to penicillins.  MEDICATIONS:  Current Outpatient Prescriptions  Medication Sig Dispense Refill  . amLODipine (NORVASC) 5 MG tablet Take 5 mg by mouth daily.    . cholecalciferol (VITAMIN D) 1000 UNITS tablet Take 1,000 Units by mouth daily.    . Clobetasol Propionate 0.05 % shampoo Reported on 01/05/2016    . fluocinonide (LIDEX) 0.05 % external solution Reported on 01/05/2016    . HYDROcodone-homatropine (HYCODAN) 5-1.5 MG/5ML syrup Take 5 mLs by mouth every 6 (six) hours as needed for cough. 120 mL 0  . hydrocortisone 2.5 % cream Reported on 01/05/2016    . Multiple Minerals-Vitamins (CALCIUM & VIT D3 BONE HEALTH PO) Take 750 mg by mouth 2 (two) times daily.    .Marland Kitchenosimertinib mesylate (TAGRISSO) 80 MG tablet Take 1 tablet (80 mg total) by mouth daily. 30 tablet 2  . potassium chloride (K-DUR) 10 MEQ tablet Take 1 tablet  by mouth daily.    Marland Kitchen TAGRISSO 80 MG tablet   2  . valsartan-hydrochlorothiazide (DIOVAN-HCT) 160-25 MG per tablet Take 1 tablet by mouth daily.     No current facility-administered medications for this visit.    SURGICAL HISTORY:  Past Surgical History  Procedure Laterality Date  . Soft tissue biopsy  06/19/12    L 5th rib=metastatic adenocarcinoma  . Ovarian cyst removal    . Appendectomy      REVIEW OF SYSTEMS:   Constitutional: positive for fatigue Eyes: negative Ears, nose, mouth, throat, and face: negative Respiratory: negative Cardiovascular: negative Gastrointestinal: negative Genitourinary:negative Integument/breast: negative Hematologic/lymphatic: negative Musculoskeletal:negative Neurological: negative Behavioral/Psych: negative Endocrine: negative Allergic/Immunologic: negative   PHYSICAL EXAMINATION: General appearance: alert, cooperative and no distress Head: Normocephalic, without obvious abnormality, atraumatic Neck: no adenopathy, no JVD, supple, symmetrical, trachea midline and thyroid not enlarged, symmetric, no tenderness/mass/nodules Lymph nodes: Cervical, supraclavicular, and axillary nodes normal. Resp: clear to auscultation bilaterally Back: symmetric, no curvature. ROM normal. No CVA tenderness. Cardio: regular rate and rhythm, S1, S2 normal, no murmur, click, rub or gallop GI: soft, non-tender; bowel sounds normal; no masses,  no organomegaly Extremities: extremities normal, atraumatic, no cyanosis or edema Neurologic: Alert and oriented X 3, normal strength and tone. Normal symmetric reflexes. Normal coordination and gait  ECOG PERFORMANCE STATUS: 1 - Symptomatic but completely ambulatory  Blood pressure 126/43, pulse 78, temperature 97.7 F (36.5 C), temperature source Oral, resp. rate 18, height _0  (1.575 m), weight 138 lb 14.4 oz (63.005 kg), SpO2 100 %.  LABORATORY DATA: Lab Results  Component Value Date   WBC 4.5 04/04/2016   HGB 10.3* 04/04/2016   HCT 31.9* 04/04/2016   MCV 90.1 04/04/2016   PLT 74* 04/04/2016      Chemistry      Component Value Date/Time   NA 138 04/04/2016 1542   NA 136 09/23/2012 1525   K 4.5 04/04/2016 1542   K 4.0 09/23/2012 1525   CL 102 05/06/2013 1511   CL 101 09/23/2012 1525   CO2 27 04/04/2016 1542   CO2 26 09/23/2012 1525   BUN 30.4* 04/04/2016 1542   BUN 30* 09/23/2012 1525   CREATININE 0.8 04/04/2016 1542    CREATININE 0.84 09/23/2012 1525      Component Value Date/Time   CALCIUM 9.3 04/04/2016 1542   CALCIUM 8.9 09/23/2012 1525   ALKPHOS 103 04/04/2016 1542   ALKPHOS 52 09/23/2012 1525   AST 46* 04/04/2016 1542   AST 24 09/23/2012 1525   ALT 46 04/04/2016 1542   ALT 31 09/23/2012 1525   BILITOT 0.59 04/04/2016 1542   BILITOT 0.8 09/23/2012 1525       RADIOGRAPHIC STUDIES: Ct Chest W Contrast  04/05/2016  CLINICAL DATA:  70 year old female with history of metastatic non-small cell lung cancer (adenocarcinoma) with bone metastases. Followup evaluation. EXAM: CT CHEST AND ABDOMEN WITH CONTRAST TECHNIQUE: Multidetector CT imaging of the chest and abdomen was performed following the standard protocol during bolus administration of intravenous contrast. CONTRAST:  178m ISOVUE-300 IOPAMIDOL (ISOVUE-300) INJECTION 61% COMPARISON:  Multiple priors, most recently CT of the chest, abdomen and pelvis 01/13/2016. FINDINGS: CT CHEST FINDINGS Mediastinum/Lymph Nodes: Heart size is mildly enlarged. There is no significant pericardial fluid, thickening or pericardial calcification. No pathologically enlarged mediastinal or hilar lymph nodes. Esophagus is unremarkable in appearance. No axillary lymphadenopathy. Lungs/Pleura: Compared to the prior examination the extent of patchy ground-glass attenuation and septal thickening throughout the lungs bilaterally has significantly improved. There continues  to be scattered areas of peripheral predominant septal thickening, much of which is nodular; an appearance that is concerning for widespread lymphangitic spread of tumor throughout the lungs bilaterally. The overall disease burden appears decreased compared to the prior examination. There continues to be profound thickening of the left major fissure, presumably related to pleural-based tumor, however this has decreased compared to the prior examination. Small chronic left-sided pleural effusion is slightly larger than  the prior study and there continues to be a few areas of pleural-based nodularity and enhancement which are very subtle, but imply further left-sided pleural involvement. This is most evident adjacent to the expansile lesion in the lateral aspect of the left fourth rib (image 20 of series 2). Musculoskeletal/Soft Tissues: There are numerous mixed lytic and sclerotic lesions scattered throughout the visualized axial and appendicular skeleton, the largest of which occupy much of the vertebral bodies at C7, T3, T6 and T9. At T3 there has been an minimal compression with approximately 25% loss of central vertebral body height. Overall, the T3 lesion appears more sclerotic than the prior study, with resolution of the surrounding soft tissue components and epidural component of the lesion compared to the prior examination. Expansile predominantly sclerotic lesion again noted in the lateral aspect of the left fourth rib, similar to the prior examination. CT ABDOMEN FINDINGS Hepatobiliary: There continues to be heterogeneous attenuation/enhancement in the liver which is very similar to the prior examination, without well-defined infiltrative mass-like lesion. Overall, the findings favored to reflect a combination of altered hepatic perfusion and heterogeneous hepatic steatosis, rather than new metastatic disease to the liver. Gallbladder is partially contracted and otherwise unremarkable in appearance. No intra or extrahepatic biliary ductal dilatation. Pancreas: No pancreatic mass. No pancreatic ductal dilatation. No pancreatic or peripancreatic fluid or inflammatory changes. Spleen: Unremarkable. Adrenals/Urinary Tract: 3 cm low-attenuation lesion in the lower pole of the left kidney is compatible with a simple cyst. Right kidney is normal in appearance. The degree of left-sided proximal hydroureteronephrosis has decreased compared to the prior study, currently mild, terminating at the level of the proximal third of the  left ureter. No definite obstructing stone or soft tissue lesion identified. No right-sided hydroureteronephrosis in the visualized abdomen. Bilateral adrenal glands are normal in appearance. Stomach/Bowel: Normal appearance of the stomach. No pathologic dilatation of the visualized portions of small bowel or colon. Vascular/Lymphatic: Atherosclerosis throughout the visualized abdominal vasculature, without evidence of aneurysm or dissection. No lymphadenopathy noted in the visualized abdomen. Other: No significant volume of ascites and no pneumoperitoneum in the visualized portions of the peritoneal cavity. Musculoskeletal: Again noted are multiple mixed lytic and sclerotic lesions which appear more apparent than the prior examination, but this is likely related to increasing sclerosis in the setting of healing osseous metastasis. However, some lesions do appear slightly larger, including a 14 mm sclerotic lesion in the left side of S1 (image 85 of series 2) which previously measured only 9 mm. Sagittal reconstructions demonstrate bilateral pars defects at L5 with 1 cm of anterolisthesis of L5 upon S1. IMPRESSION: 1. Overall, today's study demonstrates a positive response to therapy with what appears to be improving lymphangitic spread of tumor throughout the lungs bilaterally, slightly improved left pleural involvement, and small malignant left pleural effusion. Numerous osseous metastases are again noted, some of which appear slightly more apparent than the prior examination. Whether this reflects progression of disease or simply osseous healing is uncertain on today's examination. 2. Small volume of ascites noted on the prior study appears  to have decreased or resolved (pelvis was not imaged). 3. Persistent heterogeneous appearance of the liver is very similar to the prior study but favored to reflect heterogeneous hepatic steatosis and heterogeneous per fusion of the liver rather than metastatic disease. 4.  Improving now mild proximal left hydroureteronephrosis. No obstructing stone or definite soft tissue lesion. This is favored to represent a proximal left ureteral stricture. 5. Atherosclerosis. 6. Mild cardiomegaly. Electronically Signed   By: Vinnie Langton M.D.   On: 04/05/2016 08:52   Ct Abdomen W Contrast  04/05/2016  CLINICAL DATA:  70 year old female with history of metastatic non-small cell lung cancer (adenocarcinoma) with bone metastases. Followup evaluation. EXAM: CT CHEST AND ABDOMEN WITH CONTRAST TECHNIQUE: Multidetector CT imaging of the chest and abdomen was performed following the standard protocol during bolus administration of intravenous contrast. CONTRAST:  172m ISOVUE-300 IOPAMIDOL (ISOVUE-300) INJECTION 61% COMPARISON:  Multiple priors, most recently CT of the chest, abdomen and pelvis 01/13/2016. FINDINGS: CT CHEST FINDINGS Mediastinum/Lymph Nodes: Heart size is mildly enlarged. There is no significant pericardial fluid, thickening or pericardial calcification. No pathologically enlarged mediastinal or hilar lymph nodes. Esophagus is unremarkable in appearance. No axillary lymphadenopathy. Lungs/Pleura: Compared to the prior examination the extent of patchy ground-glass attenuation and septal thickening throughout the lungs bilaterally has significantly improved. There continues to be scattered areas of peripheral predominant septal thickening, much of which is nodular; an appearance that is concerning for widespread lymphangitic spread of tumor throughout the lungs bilaterally. The overall disease burden appears decreased compared to the prior examination. There continues to be profound thickening of the left major fissure, presumably related to pleural-based tumor, however this has decreased compared to the prior examination. Small chronic left-sided pleural effusion is slightly larger than the prior study and there continues to be a few areas of pleural-based nodularity and enhancement  which are very subtle, but imply further left-sided pleural involvement. This is most evident adjacent to the expansile lesion in the lateral aspect of the left fourth rib (image 20 of series 2). Musculoskeletal/Soft Tissues: There are numerous mixed lytic and sclerotic lesions scattered throughout the visualized axial and appendicular skeleton, the largest of which occupy much of the vertebral bodies at C7, T3, T6 and T9. At T3 there has been an minimal compression with approximately 25% loss of central vertebral body height. Overall, the T3 lesion appears more sclerotic than the prior study, with resolution of the surrounding soft tissue components and epidural component of the lesion compared to the prior examination. Expansile predominantly sclerotic lesion again noted in the lateral aspect of the left fourth rib, similar to the prior examination. CT ABDOMEN FINDINGS Hepatobiliary: There continues to be heterogeneous attenuation/enhancement in the liver which is very similar to the prior examination, without well-defined infiltrative mass-like lesion. Overall, the findings favored to reflect a combination of altered hepatic perfusion and heterogeneous hepatic steatosis, rather than new metastatic disease to the liver. Gallbladder is partially contracted and otherwise unremarkable in appearance. No intra or extrahepatic biliary ductal dilatation. Pancreas: No pancreatic mass. No pancreatic ductal dilatation. No pancreatic or peripancreatic fluid or inflammatory changes. Spleen: Unremarkable. Adrenals/Urinary Tract: 3 cm low-attenuation lesion in the lower pole of the left kidney is compatible with a simple cyst. Right kidney is normal in appearance. The degree of left-sided proximal hydroureteronephrosis has decreased compared to the prior study, currently mild, terminating at the level of the proximal third of the left ureter. No definite obstructing stone or soft tissue lesion identified. No right-sided  hydroureteronephrosis  in the visualized abdomen. Bilateral adrenal glands are normal in appearance. Stomach/Bowel: Normal appearance of the stomach. No pathologic dilatation of the visualized portions of small bowel or colon. Vascular/Lymphatic: Atherosclerosis throughout the visualized abdominal vasculature, without evidence of aneurysm or dissection. No lymphadenopathy noted in the visualized abdomen. Other: No significant volume of ascites and no pneumoperitoneum in the visualized portions of the peritoneal cavity. Musculoskeletal: Again noted are multiple mixed lytic and sclerotic lesions which appear more apparent than the prior examination, but this is likely related to increasing sclerosis in the setting of healing osseous metastasis. However, some lesions do appear slightly larger, including a 14 mm sclerotic lesion in the left side of S1 (image 85 of series 2) which previously measured only 9 mm. Sagittal reconstructions demonstrate bilateral pars defects at L5 with 1 cm of anterolisthesis of L5 upon S1. IMPRESSION: 1. Overall, today's study demonstrates a positive response to therapy with what appears to be improving lymphangitic spread of tumor throughout the lungs bilaterally, slightly improved left pleural involvement, and small malignant left pleural effusion. Numerous osseous metastases are again noted, some of which appear slightly more apparent than the prior examination. Whether this reflects progression of disease or simply osseous healing is uncertain on today's examination. 2. Small volume of ascites noted on the prior study appears to have decreased or resolved (pelvis was not imaged). 3. Persistent heterogeneous appearance of the liver is very similar to the prior study but favored to reflect heterogeneous hepatic steatosis and heterogeneous per fusion of the liver rather than metastatic disease. 4. Improving now mild proximal left hydroureteronephrosis. No obstructing stone or definite soft  tissue lesion. This is favored to represent a proximal left ureteral stricture. 5. Atherosclerosis. 6. Mild cardiomegaly. Electronically Signed   By: Vinnie Langton M.D.   On: 04/05/2016 08:52   ASSESSMENT AND PLAN: This is a very pleasant 69 years old Hispanic female with metastatic non-small cell lung cancer, adenocarcinoma currently on treatment with Tarceva 150 mg by mouth daily status post 36 months of treatment and tolerating it fairly well. She is currently off treatment with Tarceva since 01/05/2016. Her recent CT scan of the chest, abdomen and pelvis showed evidence for significant disease progression in the lungs as well as liver. Her comprehensive metabolic panel also showed liver dysfunction consistent with her disease progression in the liver. Her brain MRI showed no concerning finding for disease progression in the brain. The molecular study showed T790M resistant mutation The patient was started on treatment with Tagrisso 80 mg by mouth daily on 02/03/2016. Status post 2 month of treatment. She is tolerating her treatment fairly well. She has improvement in her general condition and also improvement in her liver enzyme after starting the treatment. The recent CT scan of the chest, abdomen and pelvis showed improvement of her disease. I discussed the scan results with the patient and her family members. I recommended for the patient to continue her treatment with Tagrisso. I would see her back for follow-up visit in one month for reevaluation with repeat blood work. For the metastatic bone disease, she will continue on Xgeva as previously scheduled. For the dry cough, she will continue on Hycodan 5 ML by mouth every 6 hours as needed. For the iron deficiency, the patient will continue on over the counter ferrous sulfate. She was advised to call immediately if she has any concerning symptoms in the interval. The patient voices understanding of current disease status and treatment  options and is in agreement with  the current care plan.  All questions were answered. The patient knows to call the clinic with any problems, questions or concerns. We can certainly see the patient much sooner if necessary.  Disclaimer: This note was dictated with voice recognition software. Similar sounding words can inadvertently be transcribed and may not be corrected upon review.

## 2016-04-11 NOTE — Patient Instructions (Signed)
Denosumab injection  What is this medicine?  DENOSUMAB (den oh sue mab) slows bone breakdown. Prolia is used to treat osteoporosis in women after menopause and in men. Xgeva is used to prevent bone fractures and other bone problems caused by cancer bone metastases. Xgeva is also used to treat giant cell tumor of the bone.  This medicine may be used for other purposes; ask your health care provider or pharmacist if you have questions.  What should I tell my health care provider before I take this medicine?  They need to know if you have any of these conditions:  -dental disease  -eczema  -infection or history of infections  -kidney disease or on dialysis  -low blood calcium or vitamin D  -malabsorption syndrome  -scheduled to have surgery or tooth extraction  -taking medicine that contains denosumab  -thyroid or parathyroid disease  -an unusual reaction to denosumab, other medicines, foods, dyes, or preservatives  -pregnant or trying to get pregnant  -breast-feeding  How should I use this medicine?  This medicine is for injection under the skin. It is given by a health care professional in a hospital or clinic setting.  If you are getting Prolia, a special MedGuide will be given to you by the pharmacist with each prescription and refill. Be sure to read this information carefully each time.  For Prolia, talk to your pediatrician regarding the use of this medicine in children. Special care may be needed. For Xgeva, talk to your pediatrician regarding the use of this medicine in children. While this drug may be prescribed for children as young as 13 years for selected conditions, precautions do apply.  Overdosage: If you think you have taken too much of this medicine contact a poison control center or emergency room at once.  NOTE: This medicine is only for you. Do not share this medicine with others.  What if I miss a dose?  It is important not to miss your dose. Call your doctor or health care professional if you are  unable to keep an appointment.  What may interact with this medicine?  Do not take this medicine with any of the following medications:  -other medicines containing denosumab  This medicine may also interact with the following medications:  -medicines that suppress the immune system  -medicines that treat cancer  -steroid medicines like prednisone or cortisone  This list may not describe all possible interactions. Give your health care provider a list of all the medicines, herbs, non-prescription drugs, or dietary supplements you use. Also tell them if you smoke, drink alcohol, or use illegal drugs. Some items may interact with your medicine.  What should I watch for while using this medicine?  Visit your doctor or health care professional for regular checks on your progress. Your doctor or health care professional may order blood tests and other tests to see how you are doing.  Call your doctor or health care professional if you get a cold or other infection while receiving this medicine. Do not treat yourself. This medicine may decrease your body's ability to fight infection.  You should make sure you get enough calcium and vitamin D while you are taking this medicine, unless your doctor tells you not to. Discuss the foods you eat and the vitamins you take with your health care professional.  See your dentist regularly. Brush and floss your teeth as directed. Before you have any dental work done, tell your dentist you are receiving this medicine.  Do   not become pregnant while taking this medicine or for 5 months after stopping it. Women should inform their doctor if they wish to become pregnant or think they might be pregnant. There is a potential for serious side effects to an unborn child. Talk to your health care professional or pharmacist for more information.  What side effects may I notice from receiving this medicine?  Side effects that you should report to your doctor or health care professional as soon as  possible:  -allergic reactions like skin rash, itching or hives, swelling of the face, lips, or tongue  -breathing problems  -chest pain  -fast, irregular heartbeat  -feeling faint or lightheaded, falls  -fever, chills, or any other sign of infection  -muscle spasms, tightening, or twitches  -numbness or tingling  -skin blisters or bumps, or is dry, peels, or red  -slow healing or unexplained pain in the mouth or jaw  -unusual bleeding or bruising  Side effects that usually do not require medical attention (Report these to your doctor or health care professional if they continue or are bothersome.):  -muscle pain  -stomach upset, gas  This list may not describe all possible side effects. Call your doctor for medical advice about side effects. You may report side effects to FDA at 1-800-FDA-1088.  Where should I keep my medicine?  This medicine is only given in a clinic, doctor's office, or other health care setting and will not be stored at home.  NOTE: This sheet is a summary. It may not cover all possible information. If you have questions about this medicine, talk to your doctor, pharmacist, or health care provider.      2016, Elsevier/Gold Standard. (2012-05-13 12:37:47)

## 2016-04-11 NOTE — Telephone Encounter (Signed)
Gave pt apt & avs °

## 2016-04-28 ENCOUNTER — Other Ambulatory Visit: Payer: Self-pay | Admitting: Internal Medicine

## 2016-04-28 MED FILL — *TAGRISSO 80MG TABLET: 80 | 30 days supply | Qty: 30 | Fill #0

## 2016-05-03 ENCOUNTER — Other Ambulatory Visit: Payer: Self-pay | Admitting: Internal Medicine

## 2016-05-03 DIAGNOSIS — Z1231 Encounter for screening mammogram for malignant neoplasm of breast: Secondary | ICD-10-CM

## 2016-05-16 ENCOUNTER — Ambulatory Visit (HOSPITAL_BASED_OUTPATIENT_CLINIC_OR_DEPARTMENT_OTHER): Payer: 59 | Admitting: Internal Medicine

## 2016-05-16 ENCOUNTER — Telehealth: Payer: Self-pay | Admitting: Internal Medicine

## 2016-05-16 ENCOUNTER — Encounter: Payer: Self-pay | Admitting: Internal Medicine

## 2016-05-16 ENCOUNTER — Other Ambulatory Visit (HOSPITAL_BASED_OUTPATIENT_CLINIC_OR_DEPARTMENT_OTHER): Payer: 59

## 2016-05-16 VITALS — BP 147/47 | HR 70 | Temp 98.0°F | Resp 18 | Ht 62.0 in | Wt 137.8 lb

## 2016-05-16 DIAGNOSIS — D630 Anemia in neoplastic disease: Secondary | ICD-10-CM

## 2016-05-16 DIAGNOSIS — Z5111 Encounter for antineoplastic chemotherapy: Secondary | ICD-10-CM

## 2016-05-16 DIAGNOSIS — C799 Secondary malignant neoplasm of unspecified site: Secondary | ICD-10-CM

## 2016-05-16 DIAGNOSIS — C3432 Malignant neoplasm of lower lobe, left bronchus or lung: Secondary | ICD-10-CM

## 2016-05-16 DIAGNOSIS — E611 Iron deficiency: Secondary | ICD-10-CM

## 2016-05-16 DIAGNOSIS — C7951 Secondary malignant neoplasm of bone: Secondary | ICD-10-CM

## 2016-05-16 DIAGNOSIS — R05 Cough: Secondary | ICD-10-CM

## 2016-05-16 DIAGNOSIS — C787 Secondary malignant neoplasm of liver and intrahepatic bile duct: Secondary | ICD-10-CM

## 2016-05-16 LAB — CBC WITH DIFFERENTIAL/PLATELET
BASO%: 0.5 % (ref 0.0–2.0)
BASOS ABS: 0 10*3/uL (ref 0.0–0.1)
EOS ABS: 0.1 10*3/uL (ref 0.0–0.5)
EOS%: 2.1 % (ref 0.0–7.0)
HEMATOCRIT: 30.4 % — AB (ref 34.8–46.6)
HEMOGLOBIN: 9.8 g/dL — AB (ref 11.6–15.9)
LYMPH%: 16.8 % (ref 14.0–49.7)
MCH: 29.1 pg (ref 25.1–34.0)
MCHC: 32.3 g/dL (ref 31.5–36.0)
MCV: 90.2 fL (ref 79.5–101.0)
MONO#: 0.5 10*3/uL (ref 0.1–0.9)
MONO%: 12.7 % (ref 0.0–14.0)
NEUT#: 2.9 10*3/uL (ref 1.5–6.5)
NEUT%: 67.9 % (ref 38.4–76.8)
PLATELETS: 73 10*3/uL — AB (ref 145–400)
RBC: 3.37 10*6/uL — ABNORMAL LOW (ref 3.70–5.45)
RDW: 15.4 % — AB (ref 11.2–14.5)
WBC: 4.2 10*3/uL (ref 3.9–10.3)
lymph#: 0.7 10*3/uL — ABNORMAL LOW (ref 0.9–3.3)

## 2016-05-16 LAB — COMPREHENSIVE METABOLIC PANEL
ALBUMIN: 3.1 g/dL — AB (ref 3.5–5.0)
ALK PHOS: 83 U/L (ref 40–150)
ALT: 57 U/L — ABNORMAL HIGH (ref 0–55)
ANION GAP: 7 meq/L (ref 3–11)
AST: 50 U/L — ABNORMAL HIGH (ref 5–34)
BUN: 30.2 mg/dL — ABNORMAL HIGH (ref 7.0–26.0)
CALCIUM: 9.2 mg/dL (ref 8.4–10.4)
CO2: 25 mEq/L (ref 22–29)
Chloride: 107 mEq/L (ref 98–109)
Creatinine: 0.9 mg/dL (ref 0.6–1.1)
EGFR: 64 mL/min/{1.73_m2} — AB (ref >90)
Glucose: 94 mg/dl (ref 70–140)
POTASSIUM: 4.7 meq/L (ref 3.5–5.1)
SODIUM: 139 meq/L (ref 136–145)
Total Bilirubin: 0.5 mg/dL (ref 0.20–1.20)
Total Protein: 7.6 g/dL (ref 6.4–8.3)

## 2016-05-16 NOTE — Progress Notes (Signed)
[place] [phone]   [phone]  OFFICE PROGRESS NOTE  CLOWARD,DAVIS L, MD [address] [place] [address]  DIAGNOSIS: Metastatic non-small cell lung cancer, adenocarcinoma with positive EGFR mutation in exon 19 and negative ALK gene translocation diagnosed in July 2013 with bone metastasis. Now with resistant T790M mutation.  PRIOR THERAPY:  1) Status post radiotherapy to the left face rib metastasis under the care of Dr. Lisbeth Renshaw.  2) stereotactic radiotherapy to the enlarging left lower lobe lung nodule under the care of Dr. Lisbeth Renshaw. 3) treatment with Tarceva 150 mg by mouth daily, therapy beginning 07/20/2012. Status post approximately 36 months of therapy. This was discontinued secondary to disease progression  CURRENT THERAPY:  1) Tagrisso 80 mg by mouth daily started 02/03/2016. Status post 3 months of treatment. 2) Xgeva 120 mg subcutaneously every 4 weeks for bone disease.   CHEMOTHERAPY INTENT: Palliative  CURRENT # OF CHEMOTHERAPY CYCLES: 4 CURRENT ANTIEMETICS: Compazine  CURRENT SMOKING STATUS: Never smoker  ORAL CHEMOTHERAPY AND CONSENT: Tarceva  CURRENT BISPHOSPHONATES USE: Xgeva  PAIN MANAGEMENT: None  NARCOTICS INDUCED CONSTIPATION: None  LIVING WILL AND CODE STATUS: Full code  INTERVAL HISTORY: Jacqueline Leonard 70 y.o. female returns to the clinic today for monthly followup visit accompanied by her niece, [name]. The patient is currently on treatment with Tagrisso 80 mg by mouth daily status post 3 months of treatment and she is tolerating it fairly well with no significant adverse effects. She denied having any significant skin rash or diarrhea. She has no fever or chills, no nausea or vomiting. The patient denied having any significant chest pain, shortness of breath, cough or hemoptysis. She has repeat CBC and compliance metabolic panel performed recently and she is here for evaluation and discussion of her scan  results.  MEDICAL HISTORY: Past Medical History  Diagnosis Date  . Lung mass   . Hypertension   . Hyperlipidemia   . Vitamin D deficiency   . Radiation 07/11/12-07/24/12    Palliative lung tx 30 gray in 10 fx  . Status post chemoradiation     Tarceva  . External hemorrhoids   . Allergy   . Anxiety   . Cancer, metastatic to bone ([facility]) 06/19/12    bx=L 5th ribmetastatic Adenocarcinoma with known lung mass  . S/P radiation therapy 07/22/14-07/31/14    left lung/60gy/38f  . Encounter for antineoplastic chemotherapy 02/14/2016    ALLERGIES:  is allergic to penicillins.  MEDICATIONS:  Current Outpatient Prescriptions  Medication Sig Dispense Refill  . amLODipine (NORVASC) 5 MG tablet Take 5 mg by mouth daily.    . cholecalciferol (VITAMIN D) 1000 UNITS tablet Take 1,000 Units by mouth daily.    . Clobetasol Propionate 0.05 % shampoo Reported on 01/05/2016    . fluocinonide (LIDEX) 0.05 % external solution Reported on 01/05/2016    . HYDROcodone-homatropine (HYCODAN) 5-1.5 MG/5ML syrup Take 5 mLs by mouth every 6 (six) hours as needed for cough. 120 mL 0  . hydrocortisone 2.5 % cream Reported on 01/05/2016    . Multiple Minerals-Vitamins (CALCIUM & VIT D3 BONE HEALTH PO) Take 750 mg by mouth 2 (two) times daily.    . potassium chloride (K-DUR) 10 MEQ tablet Take 1 tablet by mouth daily.    .Marland KitchenTAGRISSO 80 MG tablet   2  . TAGRISSO 80 MG tablet TAKE 1 TABLET BY MOUTH ONCE DAILY 30 tablet 2  . valsartan-hydrochlorothiazide (DIOVAN-HCT) 160-25 MG per tablet Take 1 tablet  by mouth daily.     No current facility-administered medications for this visit.    SURGICAL HISTORY:  Past Surgical History  Procedure Laterality Date  . Soft tissue biopsy  06/19/12    L 5th rib=metastatic adenocarcinoma  . Ovarian cyst removal    . Appendectomy      REVIEW OF SYSTEMS:  A comprehensive review of systems was negative except for: Constitutional: positive for fatigue   PHYSICAL EXAMINATION: General  appearance: alert, cooperative and no distress Head: Normocephalic, without obvious abnormality, atraumatic Neck: no adenopathy, no JVD, supple, symmetrical, trachea midline and thyroid not enlarged, symmetric, no tenderness/mass/nodules Lymph nodes: Cervical, supraclavicular, and axillary nodes normal. Resp: clear to auscultation bilaterally Back: symmetric, no curvature. ROM normal. No CVA tenderness. Cardio: regular rate and rhythm, S1, S2 normal, no murmur, click, rub or gallop GI: soft, non-tender; bowel sounds normal; no masses,  no organomegaly Extremities: extremities normal, atraumatic, no cyanosis or edema Neurologic: Alert and oriented X 3, normal strength and tone. Normal symmetric reflexes. Normal coordination and gait  ECOG PERFORMANCE STATUS: 1 - Symptomatic but completely ambulatory  Blood pressure 147/47, pulse 70, temperature 98 F (36.7 C), temperature source Oral, resp. rate 18, height 5' 2"  (1.575 m), weight 137 lb 12.8 oz (62.506 kg), SpO2 100 %.  LABORATORY DATA: Lab Results  Component Value Date   WBC 4.2 05/16/2016   HGB 9.8* 05/16/2016   HCT 30.4* 05/16/2016   MCV 90.2 05/16/2016   PLT 73* 05/16/2016      Chemistry      Component Value Date/Time   NA 138 04/04/2016 1542   NA 136 09/23/2012 1525   K 4.5 04/04/2016 1542   K 4.0 09/23/2012 1525   CL 102 05/06/2013 1511   CL 101 09/23/2012 1525   CO2 27 04/04/2016 1542   CO2 26 09/23/2012 1525   BUN 30.4* 04/04/2016 1542   BUN 30* 09/23/2012 1525   CREATININE 0.8 04/04/2016 1542   CREATININE 0.84 09/23/2012 1525      Component Value Date/Time   CALCIUM 9.3 04/04/2016 1542   CALCIUM 8.9 09/23/2012 1525   ALKPHOS 103 04/04/2016 1542   ALKPHOS 52 09/23/2012 1525   AST 46* 04/04/2016 1542   AST 24 09/23/2012 1525   ALT 46 04/04/2016 1542   ALT 31 09/23/2012 1525   BILITOT 0.59 04/04/2016 1542   BILITOT 0.8 09/23/2012 1525       RADIOGRAPHIC STUDIES: No results found. ASSESSMENT AND PLAN:  This is a very pleasant 70 years old Hispanic female with metastatic non-small cell lung cancer, adenocarcinoma currently on treatment with Tarceva 150 mg by mouth daily status post 36 months of treatment and tolerating it fairly well. She is currently off treatment with Tarceva since 01/05/2016. Her recent CT scan of the chest, abdomen and pelvis showed evidence for significant disease progression in the lungs as well as liver. Her comprehensive metabolic panel also showed liver dysfunction consistent with her disease progression in the liver. Her brain MRI showed no concerning finding for disease progression in the brain. The molecular study showed T790M resistant mutation The patient was started on treatment with Tagrisso 80 mg by mouth daily on 02/03/2016. Status post 3 month of treatment. She is tolerating her treatment fairly well.  I recommended for the patient to continue her treatment with Tagrisso. I would see her back for follow-up visit in one month for reevaluation with repeat blood work. For the metastatic bone disease, she will continue on Xgeva as previously scheduled.  For the dry cough, she will continue on Hycodan 5 ML by mouth every 6 hours as needed. For the iron deficiency, the patient will continue on over the counter ferrous sulfate. She was advised to call immediately if she has any concerning symptoms in the interval. The patient voices understanding of current disease status and treatment options and is in agreement with the current care plan.  All questions were answered. The patient knows to call the clinic with any problems, questions or concerns. We can certainly see the patient much sooner if necessary.  Disclaimer: This note was dictated with voice recognition software. Similar sounding words can inadvertently be transcribed and may not be corrected upon review.

## 2016-05-16 NOTE — Addendum Note (Signed)
Addended by: Ardeen Garland on: 05/16/2016 03:44 PM   Modules accepted: Orders, Medications

## 2016-05-16 NOTE — Telephone Encounter (Signed)
Gave pt cal & avs °

## 2016-05-17 ENCOUNTER — Ambulatory Visit
Admission: RE | Admit: 2016-05-17 | Discharge: 2016-05-17 | Disposition: A | Discharge status: Home / Self Care | Payer: 59 | Source: Ambulatory Visit | Attending: Internal Medicine | Admitting: Internal Medicine

## 2016-05-17 DIAGNOSIS — Z1231 Encounter for screening mammogram for malignant neoplasm of breast: Secondary | ICD-10-CM

## 2016-05-29 MED FILL — *TAGRISSO 80MG TABLET: 80 | 30 days supply | Qty: 30 | Fill #1

## 2016-06-05 ENCOUNTER — Encounter: Payer: Self-pay | Admitting: Internal Medicine

## 2016-06-05 NOTE — Progress Notes (Signed)
form left in box. faxed notes  [phone]

## 2016-06-13 ENCOUNTER — Ambulatory Visit (HOSPITAL_BASED_OUTPATIENT_CLINIC_OR_DEPARTMENT_OTHER): Payer: 59 | Admitting: Internal Medicine

## 2016-06-13 ENCOUNTER — Encounter: Payer: Self-pay | Admitting: Internal Medicine

## 2016-06-13 ENCOUNTER — Other Ambulatory Visit (HOSPITAL_BASED_OUTPATIENT_CLINIC_OR_DEPARTMENT_OTHER): Payer: 59

## 2016-06-13 ENCOUNTER — Telehealth: Payer: Self-pay | Admitting: Internal Medicine

## 2016-06-13 VITALS — BP 147/49 | HR 69 | Temp 98.4°F | Resp 18 | Ht 62.0 in | Wt 140.2 lb

## 2016-06-13 DIAGNOSIS — C787 Secondary malignant neoplasm of liver and intrahepatic bile duct: Secondary | ICD-10-CM

## 2016-06-13 DIAGNOSIS — C7951 Secondary malignant neoplasm of bone: Secondary | ICD-10-CM

## 2016-06-13 DIAGNOSIS — Z5111 Encounter for antineoplastic chemotherapy: Secondary | ICD-10-CM

## 2016-06-13 DIAGNOSIS — C3492 Malignant neoplasm of unspecified part of left bronchus or lung: Secondary | ICD-10-CM

## 2016-06-13 DIAGNOSIS — D509 Iron deficiency anemia, unspecified: Secondary | ICD-10-CM

## 2016-06-13 DIAGNOSIS — C799 Secondary malignant neoplasm of unspecified site: Secondary | ICD-10-CM

## 2016-06-13 DIAGNOSIS — C3490 Malignant neoplasm of unspecified part of unspecified bronchus or lung: Secondary | ICD-10-CM | POA: Diagnosis not present

## 2016-06-13 DIAGNOSIS — D630 Anemia in neoplastic disease: Secondary | ICD-10-CM

## 2016-06-13 LAB — CBC WITH DIFFERENTIAL/PLATELET
BASO%: 0.3 % (ref 0.0–2.0)
Basophils Absolute: 0 10*3/uL (ref 0.0–0.1)
EOS ABS: 0.1 10*3/uL (ref 0.0–0.5)
EOS%: 2.5 % (ref 0.0–7.0)
HCT: 29.8 % — ABNORMAL LOW (ref 34.8–46.6)
HEMOGLOBIN: 9.8 g/dL — AB (ref 11.6–15.9)
LYMPH%: 17.3 % (ref 14.0–49.7)
MCH: 29.3 pg (ref 25.1–34.0)
MCHC: 32.8 g/dL (ref 31.5–36.0)
MCV: 89.5 fL (ref 79.5–101.0)
MONO#: 0.4 10*3/uL (ref 0.1–0.9)
MONO%: 10.6 % (ref 0.0–14.0)
NEUT%: 69.3 % (ref 38.4–76.8)
NEUTROS ABS: 2.7 10*3/uL (ref 1.5–6.5)
PLATELETS: 70 10*3/uL — AB (ref 145–400)
RBC: 3.33 10*6/uL — AB (ref 3.70–5.45)
RDW: 14.7 % — AB (ref 11.2–14.5)
WBC: 3.9 10*3/uL (ref 3.9–10.3)
lymph#: 0.7 10*3/uL — ABNORMAL LOW (ref 0.9–3.3)

## 2016-06-13 LAB — COMPREHENSIVE METABOLIC PANEL
ALBUMIN: 3 g/dL — AB (ref 3.5–5.0)
ALK PHOS: 79 U/L (ref 40–150)
ALT: 26 U/L (ref 0–55)
AST: 33 U/L (ref 5–34)
Anion Gap: 6 mEq/L (ref 3–11)
BILIRUBIN TOTAL: 0.59 mg/dL (ref 0.20–1.20)
BUN: 20.8 mg/dL (ref 7.0–26.0)
CO2: 26 meq/L (ref 22–29)
CREATININE: 0.9 mg/dL (ref 0.6–1.1)
Calcium: 8.8 mg/dL (ref 8.4–10.4)
Chloride: 109 mEq/L (ref 98–109)
EGFR: 67 mL/min/{1.73_m2} — AB (ref >90)
GLUCOSE: 94 mg/dL (ref 70–140)
Potassium: 4 mEq/L (ref 3.5–5.1)
SODIUM: 140 meq/L (ref 136–145)
TOTAL PROTEIN: 7.3 g/dL (ref 6.4–8.3)

## 2016-06-13 NOTE — Progress Notes (Signed)
[place] [phone]   [phone]  OFFICE PROGRESS NOTE  CLOWARD,DAVIS L, MD [address] [place] [address]  DIAGNOSIS: Metastatic non-small cell lung cancer, adenocarcinoma with positive EGFR mutation in exon 19 and negative ALK gene translocation diagnosed in July 2013 with bone metastasis. Now with resistant T790M mutation.  PRIOR THERAPY:  1) Status post radiotherapy to the left face rib metastasis under the care of Dr. Lisbeth Renshaw.  2) stereotactic radiotherapy to the enlarging left lower lobe lung nodule under the care of Dr. Lisbeth Renshaw. 3) treatment with Tarceva 150 mg by mouth daily, therapy beginning 07/20/2012. Status post approximately 36 months of therapy. This was discontinued secondary to disease progression  CURRENT THERAPY:  1) Tagrisso 80 mg by mouth daily started 02/03/2016. Status post 4 months of treatment. 2) Xgeva 120 mg subcutaneously every 4 weeks for bone disease.   CHEMOTHERAPY INTENT: Palliative  CURRENT # OF CHEMOTHERAPY CYCLES: 5 CURRENT ANTIEMETICS: Compazine  CURRENT SMOKING STATUS: Never smoker  ORAL CHEMOTHERAPY AND CONSENT: Tarceva  CURRENT BISPHOSPHONATES USE: Xgeva  PAIN MANAGEMENT: None  NARCOTICS INDUCED CONSTIPATION: None  LIVING WILL AND CODE STATUS: Full code  INTERVAL HISTORY: Jacqueline Leonard 70 y.o. female returns to the clinic today for monthly followup visit accompanied by her husband. The patient is currently on treatment with Tagrisso 80 mg by mouth daily status post 4 months of treatment and is tolerating it fairly well with no significant adverse effects. She denied having any significant skin rash or diarrhea. She has no significant weight loss or night sweats. She has no fever or chills, no nausea or vomiting. The patient denied having any significant chest pain, shortness of breath, cough or hemoptysis. She has repeat CBC and comprehensive metabolic panel performed recently and she is here for  evaluation and discussion of her scan results.  MEDICAL HISTORY: Past Medical History  Diagnosis Date  . Lung mass   . Hypertension   . Hyperlipidemia   . Vitamin D deficiency   . Radiation 07/11/12-07/24/12    Palliative lung tx 30 gray in 10 fx  . Status post chemoradiation     Tarceva  . External hemorrhoids   . Allergy   . Anxiety   . Cancer, metastatic to bone ([facility]) 06/19/12    bx=L 5th ribmetastatic Adenocarcinoma with known lung mass  . S/P radiation therapy 07/22/14-07/31/14    left lung/60gy/80f  . Encounter for antineoplastic chemotherapy 02/14/2016    ALLERGIES:  is allergic to penicillins.  MEDICATIONS:  Current Outpatient Prescriptions  Medication Sig Dispense Refill  . amLODipine (NORVASC) 5 MG tablet Take 5 mg by mouth daily.    . cholecalciferol (VITAMIN D) 1000 UNITS tablet Take 1,000 Units by mouth daily.    . Clobetasol Propionate 0.05 % shampoo Reported on 01/05/2016    . fluocinonide (LIDEX) 0.05 % external solution Reported on 01/05/2016    . HYDROcodone-homatropine (HYCODAN) 5-1.5 MG/5ML syrup Take 5 mLs by mouth every 6 (six) hours as needed for cough. 120 mL 0  . hydrocortisone 2.5 % cream Reported on 05/16/2016    . Multiple Minerals-Vitamins (CALCIUM & VIT D3 BONE HEALTH PO) Take 750 mg by mouth 2 (two) times daily.    . potassium chloride (K-DUR) 10 MEQ tablet Take 1 tablet by mouth daily.    .Marland KitchenTAGRISSO 80 MG tablet TAKE 1 TABLET BY MOUTH ONCE DAILY 30 tablet 2  . valsartan-hydrochlorothiazide (DIOVAN-HCT) 160-25 MG per tablet Take 1 tablet by mouth  daily.     No current facility-administered medications for this visit.    SURGICAL HISTORY:  Past Surgical History  Procedure Laterality Date  . Soft tissue biopsy  06/19/12    L 5th rib=metastatic adenocarcinoma  . Ovarian cyst removal    . Appendectomy      REVIEW OF SYSTEMS:  A comprehensive review of systems was negative except for: Constitutional: positive for fatigue   PHYSICAL EXAMINATION:  General appearance: alert, cooperative and no distress Head: Normocephalic, without obvious abnormality, atraumatic Neck: no adenopathy, no JVD, supple, symmetrical, trachea midline and thyroid not enlarged, symmetric, no tenderness/mass/nodules Lymph nodes: Cervical, supraclavicular, and axillary nodes normal. Resp: clear to auscultation bilaterally Back: symmetric, no curvature. ROM normal. No CVA tenderness. Cardio: regular rate and rhythm, S1, S2 normal, no murmur, click, rub or gallop GI: soft, non-tender; bowel sounds normal; no masses,  no organomegaly Extremities: extremities normal, atraumatic, no cyanosis or edema Neurologic: Alert and oriented X 3, normal strength and tone. Normal symmetric reflexes. Normal coordination and gait  ECOG PERFORMANCE STATUS: 1 - Symptomatic but completely ambulatory  Blood pressure 147/49, pulse 69, temperature 98.4 F (36.9 C), temperature source Oral, resp. rate 18, height 5' 2"  (1.575 m), weight 140 lb 3.2 oz (63.594 kg), SpO2 100 %.  LABORATORY DATA: Lab Results  Component Value Date   WBC 3.9 06/13/2016   HGB 9.8* 06/13/2016   HCT 29.8* 06/13/2016   MCV 89.5 06/13/2016   PLT 70* 06/13/2016      Chemistry      Component Value Date/Time   NA 140 06/13/2016 1411   NA 136 09/23/2012 1525   K 4.0 06/13/2016 1411   K 4.0 09/23/2012 1525   CL 102 05/06/2013 1511   CL 101 09/23/2012 1525   CO2 26 06/13/2016 1411   CO2 26 09/23/2012 1525   BUN 20.8 06/13/2016 1411   BUN 30* 09/23/2012 1525   CREATININE 0.9 06/13/2016 1411   CREATININE 0.84 09/23/2012 1525      Component Value Date/Time   CALCIUM 8.8 06/13/2016 1411   CALCIUM 8.9 09/23/2012 1525   ALKPHOS 79 06/13/2016 1411   ALKPHOS 52 09/23/2012 1525   AST 33 06/13/2016 1411   AST 24 09/23/2012 1525   ALT 26 06/13/2016 1411   ALT 31 09/23/2012 1525   BILITOT 0.59 06/13/2016 1411   BILITOT 0.8 09/23/2012 1525       RADIOGRAPHIC STUDIES: Mm Screening Breast Tomo  Bilateral  05/18/2016  CLINICAL DATA:  Screening. EXAM: 2D DIGITAL SCREENING BILATERAL MAMMOGRAM WITH CAD AND ADJUNCT TOMO COMPARISON:  Previous exam(s). ACR Breast Density Category c: The breast tissue is heterogeneously dense, which may obscure small masses. FINDINGS: There are no findings suspicious for malignancy. Images were processed with CAD. IMPRESSION: No mammographic evidence of malignancy. A result letter of this screening mammogram will be mailed directly to the patient. RECOMMENDATION: Screening mammogram in one year. (Code:SM-B-01Y) BI-RADS CATEGORY  1: Negative. Electronically Signed   By: Lajean Manes M.D.   On: 05/18/2016 11:46   ASSESSMENT AND PLAN: This is a very pleasant 70 years old Hispanic female with metastatic non-small cell lung cancer, adenocarcinoma currently on treatment with Tarceva 150 mg by mouth daily status post 36 months of treatment and tolerating it fairly well. She is currently off treatment with Tarceva since 01/05/2016. Her recent CT scan of the chest, abdomen and pelvis showed evidence for significant disease progression in the lungs as well as liver. Her comprehensive metabolic panel also showed liver  dysfunction consistent with her disease progression in the liver. Her brain MRI showed no concerning finding for disease progression in the brain. The molecular study showed T790M resistant mutation The patient was started on treatment with Tagrisso 80 mg by mouth daily on 02/03/2016. Status post 4 month of treatment.  She has no significant complaints today. I recommended for the patient to continue her treatment with Tagrisso. I would see her back for follow-up visit in one month for reevaluation with repeat blood work as well as CT scan of the chest, abdomen and pelvis for restaging of her disease. For the metastatic bone disease, she will continue on Xgeva as previously scheduled. For the dry cough, she will continue on Hycodan 5 ML by mouth every 6 hours as  needed. For the iron deficiency, the patient will continue on over the counter ferrous sulfate. She was advised to call immediately if she has any concerning symptoms in the interval. The patient voices understanding of current disease status and treatment options and is in agreement with the current care plan.  All questions were answered. The patient knows to call the clinic with any problems, questions or concerns. We can certainly see the patient much sooner if necessary.  Disclaimer: This note was dictated with voice recognition software. Similar sounding words can inadvertently be transcribed and may not be corrected upon review.

## 2016-06-13 NOTE — Telephone Encounter (Signed)
Gave pt cal & avs °

## 2016-06-26 MED FILL — *TAGRISSO 80MG TABLET: 80 | 30 days supply | Qty: 30 | Fill #2

## 2016-07-07 ENCOUNTER — Telehealth: Payer: Self-pay

## 2016-07-07 ENCOUNTER — Other Ambulatory Visit: Payer: Self-pay | Admitting: Medical Oncology

## 2016-07-07 DIAGNOSIS — C799 Secondary malignant neoplasm of unspecified site: Secondary | ICD-10-CM

## 2016-07-07 NOTE — Telephone Encounter (Signed)
Pt want to change appt from 8/21 to 8/28 so she can have niece help her. She prefers about noon. In basket to scheduler

## 2016-07-11 ENCOUNTER — Encounter (HOSPITAL_COMMUNITY): Payer: Self-pay

## 2016-07-11 ENCOUNTER — Other Ambulatory Visit (HOSPITAL_BASED_OUTPATIENT_CLINIC_OR_DEPARTMENT_OTHER): Payer: 59

## 2016-07-11 ENCOUNTER — Ambulatory Visit (HOSPITAL_COMMUNITY)
Admission: RE | Admit: 2016-07-11 | Discharge: 2016-07-11 | Disposition: A | Discharge status: Home / Self Care | Payer: 59 | Source: Ambulatory Visit | Attending: Internal Medicine | Admitting: Internal Medicine

## 2016-07-11 DIAGNOSIS — C799 Secondary malignant neoplasm of unspecified site: Secondary | ICD-10-CM

## 2016-07-11 DIAGNOSIS — I2510 Atherosclerotic heart disease of native coronary artery without angina pectoris: Secondary | ICD-10-CM | POA: Insufficient documentation

## 2016-07-11 DIAGNOSIS — Z9221 Personal history of antineoplastic chemotherapy: Secondary | ICD-10-CM | POA: Insufficient documentation

## 2016-07-11 DIAGNOSIS — M899 Disorder of bone, unspecified: Secondary | ICD-10-CM | POA: Diagnosis not present

## 2016-07-11 DIAGNOSIS — J90 Pleural effusion, not elsewhere classified: Secondary | ICD-10-CM | POA: Diagnosis not present

## 2016-07-11 DIAGNOSIS — Z5111 Encounter for antineoplastic chemotherapy: Secondary | ICD-10-CM

## 2016-07-11 DIAGNOSIS — C3490 Malignant neoplasm of unspecified part of unspecified bronchus or lung: Secondary | ICD-10-CM | POA: Diagnosis not present

## 2016-07-11 DIAGNOSIS — C3492 Malignant neoplasm of unspecified part of left bronchus or lung: Secondary | ICD-10-CM

## 2016-07-11 DIAGNOSIS — R918 Other nonspecific abnormal finding of lung field: Secondary | ICD-10-CM | POA: Diagnosis not present

## 2016-07-11 LAB — CBC WITH DIFFERENTIAL/PLATELET
BASO%: 0.2 % (ref 0.0–2.0)
BASOS ABS: 0 10*3/uL (ref 0.0–0.1)
EOS ABS: 0.1 10*3/uL (ref 0.0–0.5)
EOS%: 1.9 % (ref 0.0–7.0)
HEMATOCRIT: 33.5 % — AB (ref 34.8–46.6)
HEMOGLOBIN: 10.7 g/dL — AB (ref 11.6–15.9)
LYMPH#: 0.9 10*3/uL (ref 0.9–3.3)
LYMPH%: 19.1 % (ref 14.0–49.7)
MCH: 28.8 pg (ref 25.1–34.0)
MCHC: 31.9 g/dL (ref 31.5–36.0)
MCV: 90.5 fL (ref 79.5–101.0)
MONO#: 0.5 10*3/uL (ref 0.1–0.9)
MONO%: 9.2 % (ref 0.0–14.0)
NEUT#: 3.4 10*3/uL (ref 1.5–6.5)
NEUT%: 69.6 % (ref 38.4–76.8)
PLATELETS: 81 10*3/uL — AB (ref 145–400)
RBC: 3.7 10*6/uL (ref 3.70–5.45)
RDW: 15.6 % — ABNORMAL HIGH (ref 11.2–14.5)
WBC: 4.9 10*3/uL (ref 3.9–10.3)

## 2016-07-11 LAB — COMPREHENSIVE METABOLIC PANEL
ALBUMIN: 3.2 g/dL — AB (ref 3.5–5.0)
ALK PHOS: 87 U/L (ref 40–150)
ALT: 39 U/L (ref 0–55)
ANION GAP: 8 meq/L (ref 3–11)
AST: 42 U/L — AB (ref 5–34)
BILIRUBIN TOTAL: 0.58 mg/dL (ref 0.20–1.20)
BUN: 23 mg/dL (ref 7.0–26.0)
CALCIUM: 9.5 mg/dL (ref 8.4–10.4)
CHLORIDE: 105 meq/L (ref 98–109)
CO2: 27 mEq/L (ref 22–29)
Creatinine: 0.8 mg/dL (ref 0.6–1.1)
EGFR: 70 mL/min/{1.73_m2} — AB (ref >90)
Glucose: 86 mg/dl (ref 70–140)
POTASSIUM: 3.8 meq/L (ref 3.5–5.1)
Sodium: 140 mEq/L (ref 136–145)
Total Protein: 7.8 g/dL (ref 6.4–8.3)

## 2016-07-11 MED ORDER — IOPAMIDOL (ISOVUE-300) INJECTION 61%
75.0000 mL | Freq: Once | INTRAVENOUS | Status: AC | PRN
  Administered 2016-07-11: 75 mL via INTRAVENOUS

## 2016-07-13 ENCOUNTER — Telehealth: Payer: Self-pay | Admitting: Internal Medicine

## 2016-07-13 NOTE — Telephone Encounter (Signed)
Spoke with pt to confirm r/s appt to 8/28 at 130 pm

## 2016-07-17 ENCOUNTER — Ambulatory Visit: Payer: 59 | Admitting: Internal Medicine

## 2016-07-24 ENCOUNTER — Encounter: Payer: Self-pay | Admitting: Internal Medicine

## 2016-07-24 ENCOUNTER — Telehealth: Payer: Self-pay | Admitting: Internal Medicine

## 2016-07-24 ENCOUNTER — Ambulatory Visit (HOSPITAL_BASED_OUTPATIENT_CLINIC_OR_DEPARTMENT_OTHER): Payer: 59 | Admitting: Internal Medicine

## 2016-07-24 VITALS — BP 160/45 | HR 69 | Temp 98.1°F | Resp 18 | Ht 62.0 in | Wt 142.4 lb

## 2016-07-24 DIAGNOSIS — D630 Anemia in neoplastic disease: Secondary | ICD-10-CM

## 2016-07-24 DIAGNOSIS — C3490 Malignant neoplasm of unspecified part of unspecified bronchus or lung: Secondary | ICD-10-CM | POA: Diagnosis not present

## 2016-07-24 DIAGNOSIS — C787 Secondary malignant neoplasm of liver and intrahepatic bile duct: Secondary | ICD-10-CM

## 2016-07-24 DIAGNOSIS — E611 Iron deficiency: Secondary | ICD-10-CM

## 2016-07-24 DIAGNOSIS — C7951 Secondary malignant neoplasm of bone: Secondary | ICD-10-CM | POA: Diagnosis not present

## 2016-07-24 DIAGNOSIS — C3492 Malignant neoplasm of unspecified part of left bronchus or lung: Secondary | ICD-10-CM

## 2016-07-24 DIAGNOSIS — R05 Cough: Secondary | ICD-10-CM

## 2016-07-24 DIAGNOSIS — C799 Secondary malignant neoplasm of unspecified site: Secondary | ICD-10-CM

## 2016-07-24 DIAGNOSIS — Z5111 Encounter for antineoplastic chemotherapy: Secondary | ICD-10-CM

## 2016-07-24 MED ORDER — INTEGRA PLUS PO CAPS: 1.0000 | ORAL_CAPSULE | Freq: Every morning | ORAL | 2 refills | Status: DC

## 2016-07-24 NOTE — Progress Notes (Signed)
[facility] [phone]   [phone]  OFFICE PROGRESS NOTE  CLOWARD,DAVIS L, MD [address] [place] [address]  DIAGNOSIS: Metastatic non-small cell lung cancer, adenocarcinoma with positive EGFR mutation in exon 19 and negative ALK gene translocation diagnosed in July 2013 with bone metastasis. Now with resistant T790M mutation.  PRIOR THERAPY:  1) Status post radiotherapy to the left face rib metastasis under the care of Dr. Lisbeth Renshaw.  2) stereotactic radiotherapy to the enlarging left lower lobe lung nodule under the care of Dr. Lisbeth Renshaw. 3) treatment with Tarceva 150 mg by mouth daily, therapy beginning 07/20/2012. Status post approximately 36 months of therapy. This was discontinued secondary to disease progression  CURRENT THERAPY:  1) Tagrisso 80 mg by mouth daily started 02/03/2016. Status post 5 months of treatment. 2) Xgeva 120 mg subcutaneously every 4 weeks for bone disease.   CHEMOTHERAPY INTENT: Palliative  CURRENT # OF CHEMOTHERAPY CYCLES: 6 CURRENT ANTIEMETICS: Compazine  CURRENT SMOKING STATUS: Never smoker  ORAL CHEMOTHERAPY AND CONSENT: Tarceva  CURRENT BISPHOSPHONATES USE: Xgeva  PAIN MANAGEMENT: None  NARCOTICS INDUCED CONSTIPATION: None  LIVING WILL AND CODE STATUS: Full code  INTERVAL HISTORY: Jacqueline Leonard 70 y.o. female returns to the clinic today for monthly followup visit accompanied by her niece. The patient is currently on treatment with Tagrisso 80 mg by mouth daily status post 5 months of treatment and is tolerating it fairly well with no significant adverse effects. She feels much better on Tagrisso than the previous EGFR tyrosine kinase inhibitor. She denied having any significant skin rash or diarrhea. She has no significant weight loss or night sweats. She has no fever or chills, no nausea or vomiting. The patient denied having any significant chest pain, shortness of breath, cough or hemoptysis. She had  repeat CT scan of the chest performed recently and she is here for evaluation and discussion of her scan results.  MEDICAL HISTORY: Past Medical History:  Diagnosis Date  . Allergy   . Anxiety   . Cancer, metastatic to bone ([facility]) 06/19/12   bx=L 5th ribmetastatic Adenocarcinoma with known lung mass  . Encounter for antineoplastic chemotherapy 02/14/2016  . External hemorrhoids   . Hyperlipidemia   . Hypertension   . Lung mass   . Radiation 07/11/12-07/24/12   Palliative lung tx 30 gray in 10 fx  . S/P radiation therapy 07/22/14-07/31/14   left lung/60gy/74f  . Status post chemoradiation    Tarceva  . Vitamin D deficiency     ALLERGIES:  is allergic to penicillins.  MEDICATIONS:  Current Outpatient Prescriptions  Medication Sig Dispense Refill  . amLODipine (NORVASC) 5 MG tablet Take 5 mg by mouth daily.    . cholecalciferol (VITAMIN D) 1000 UNITS tablet Take 1,000 Units by mouth daily.    . Clobetasol Propionate 0.05 % shampoo Reported on 01/05/2016    . fluocinonide (LIDEX) 0.05 % external solution Reported on 01/05/2016    . HYDROcodone-homatropine (HYCODAN) 5-1.5 MG/5ML syrup Take 5 mLs by mouth every 6 (six) hours as needed for cough. 120 mL 0  . hydrocortisone 2.5 % cream Reported on 05/16/2016    . Multiple Minerals-Vitamins (CALCIUM & VIT D3 BONE HEALTH PO) Take 750 mg by mouth 2 (two) times daily.    . potassium chloride (K-DUR) 10 MEQ tablet Take 1 tablet by mouth daily.    .Marland KitchenTAGRISSO 80 MG tablet TAKE 1 TABLET BY MOUTH ONCE DAILY 30 tablet 2  . valsartan-hydrochlorothiazide (DIOVAN-HCT)  160-25 MG per tablet Take 1 tablet by mouth daily.     No current facility-administered medications for this visit.     SURGICAL HISTORY:  Past Surgical History:  Procedure Laterality Date  . APPENDECTOMY    . OVARIAN CYST REMOVAL    . SOFT TISSUE BIOPSY  06/19/12   L 5th rib=metastatic adenocarcinoma    REVIEW OF SYSTEMS:  Constitutional: positive for fatigue Eyes: negative Ears,  nose, mouth, throat, and face: negative Respiratory: negative Cardiovascular: negative Gastrointestinal: negative Genitourinary:negative Integument/breast: negative Hematologic/lymphatic: negative Musculoskeletal:negative Neurological: negative Behavioral/Psych: negative Endocrine: negative Allergic/Immunologic: negative   PHYSICAL EXAMINATION: General appearance: alert, cooperative and no distress Head: Normocephalic, without obvious abnormality, atraumatic Neck: no adenopathy, no JVD, supple, symmetrical, trachea midline and thyroid not enlarged, symmetric, no tenderness/mass/nodules Lymph nodes: Cervical, supraclavicular, and axillary nodes normal. Resp: clear to auscultation bilaterally Back: symmetric, no curvature. ROM normal. No CVA tenderness. Cardio: regular rate and rhythm, S1, S2 normal, no murmur, click, rub or gallop GI: soft, non-tender; bowel sounds normal; no masses,  no organomegaly Extremities: extremities normal, atraumatic, no cyanosis or edema Neurologic: Alert and oriented X 3, normal strength and tone. Normal symmetric reflexes. Normal coordination and gait  ECOG PERFORMANCE STATUS: 1 - Symptomatic but completely ambulatory  Blood pressure (!) 160/45, pulse 69, temperature 98.1 F (36.7 C), temperature source Oral, resp. rate 18, height 5' 2"  (1.575 m), weight 142 lb 6.4 oz (64.6 kg), SpO2 100 %.  LABORATORY DATA: Lab Results  Component Value Date   WBC 4.9 07/11/2016   HGB 10.7 (L) 07/11/2016   HCT 33.5 (L) 07/11/2016   MCV 90.5 07/11/2016   PLT 81 (L) 07/11/2016      Chemistry      Component Value Date/Time   NA 140 07/11/2016 1453   K 3.8 07/11/2016 1453   CL 102 05/06/2013 1511   CO2 27 07/11/2016 1453   BUN 23.0 07/11/2016 1453   CREATININE 0.8 07/11/2016 1453      Component Value Date/Time   CALCIUM 9.5 07/11/2016 1453   ALKPHOS 87 07/11/2016 1453   AST 42 (H) 07/11/2016 1453   ALT 39 07/11/2016 1453   BILITOT 0.58 07/11/2016 1453         RADIOGRAPHIC STUDIES: Ct Chest W Contrast  Result Date: 07/12/2016 CLINICAL DATA:  70 year old female with history of lung cancer with known bone metastasis. Restaging examination. EXAM: CT CHEST AND ABDOMEN WITH CONTRAST TECHNIQUE: Multidetector CT imaging of the chest and abdomen was performed following the standard protocol during bolus administration of intravenous contrast. CONTRAST:  82m ISOVUE-300 IOPAMIDOL (ISOVUE-300) INJECTION 61%<Contrast>736mISOVUE-300 IOPAMIDOL (ISOVUE-300) INJECTION 61% COMPARISON:  CT of the chest, abdomen and pelvis 04/04/2016. FINDINGS: CT CHEST FINDINGS Cardiovascular: Heart size is mildly enlarged with left atrial dilatation. There is no significant pericardial fluid, thickening or pericardial calcification. Aortic atherosclerosis (mild), without evidence of aneurysm or dissection. No coronary artery calcifications are identified. Calcifications of the aortic valve. Mediastinum/Nodes: No pathologically enlarged mediastinal or hilar lymph nodes. Esophagus is unremarkable in appearance. No axillary lymphadenopathy. Lungs/Pleura: Small left pleural effusion lying dependently, similar to the prior study. There continues to be some nodular septal thickening throughout the lungs bilaterally, predominantly in the periphery of the left lung, which is similar to the prior examination, likely to reflect areas of lymphangitic spread of tumor. Thickening of the left major fissure is similar to the prior examination. A few patchy areas of ground-glass attenuation are again noted throughout the periphery of the lungs bilaterally. Musculoskeletal: Multifocal  mixed lytic and sclerotic lesions are again noted throughout the visualized axial and appendicular skeleton, with the largest lesions in the spine again noted at C7, T3, T6 and T9. Mild compression at T3 is again noted, similar to the prior examination, with approximately 25% loss of central vertebral body height. Large  expansile lesion in the lateral aspect of the left fourth rib is also similar to the prior study. No definite new osseous lesions are noted. CT ABDOMEN FINDINGS Hepatobiliary: Heterogeneous attenuation/enhancement is again noted throughout the liver, very similar to the prior study. No discrete lesion is identified. Findings are again favored to reflect a combination of altered perfusion and heterogeneous hepatic steatosis. No intra or extrahepatic biliary ductal dilatation. Gallbladder is normal in appearance. Pancreas: No pancreatic mass. No pancreatic ductal dilatation. No pancreatic or peripancreatic fluid or inflammatory changes. Spleen: Unremarkable. Adrenals/Urinary Tract: 3.1 cm low-attenuation lesion in the lower pole of the left kidney is compatible with a simple cyst. There is minimal fullness in the left renal collecting system, which decreased compared to the prior study, without frank hydronephrosis. Right kidney is normal in appearance. Bilateral adrenal glands are not are normal in appearance. Stomach/Bowel: Normal appearance of the stomach. No pathologic dilatation of visualized portions of small bowel or colon. Vascular/Lymphatic: Aortic atherosclerosis, without aneurysm in the abdominal vasculature. No lymphadenopathy noted in the abdomen. Other: No significant volume of ascites and no pneumoperitoneum in the visualized peritoneal cavity. Musculoskeletal: Multiple lytic and sclerotic lesions appear very similar to the prior examination, without definite new lesions or compression fractures in the lumbar spine. IMPRESSION: 1. Today's study demonstrates essentially stable findings compared to the prior examination, without definite evidence of progression of metastatic disease. What appears to be treated lymphangitic spread of tumor is again noted in the lungs bilaterally (left greater than right), and small chronic left pleural effusion appears essentially stable compared to the prior examination.  Osseous lesions appear essentially unchanged. 2. Additional incidental findings, as above, similar to prior studies. Electronically Signed   By: Vinnie Langton M.D.   On: 07/12/2016 10:09   Ct Abdomen W Contrast  Result Date: 07/12/2016 CLINICAL DATA:  70 year old female with history of lung cancer with known bone metastasis. Restaging examination. EXAM: CT CHEST AND ABDOMEN WITH CONTRAST TECHNIQUE: Multidetector CT imaging of the chest and abdomen was performed following the standard protocol during bolus administration of intravenous contrast. CONTRAST:  24m ISOVUE-300 IOPAMIDOL (ISOVUE-300) INJECTION 61%<Contrast>776mISOVUE-300 IOPAMIDOL (ISOVUE-300) INJECTION 61% COMPARISON:  CT of the chest, abdomen and pelvis 04/04/2016. FINDINGS: CT CHEST FINDINGS Cardiovascular: Heart size is mildly enlarged with left atrial dilatation. There is no significant pericardial fluid, thickening or pericardial calcification. Aortic atherosclerosis (mild), without evidence of aneurysm or dissection. No coronary artery calcifications are identified. Calcifications of the aortic valve. Mediastinum/Nodes: No pathologically enlarged mediastinal or hilar lymph nodes. Esophagus is unremarkable in appearance. No axillary lymphadenopathy. Lungs/Pleura: Small left pleural effusion lying dependently, similar to the prior study. There continues to be some nodular septal thickening throughout the lungs bilaterally, predominantly in the periphery of the left lung, which is similar to the prior examination, likely to reflect areas of lymphangitic spread of tumor. Thickening of the left major fissure is similar to the prior examination. A few patchy areas of ground-glass attenuation are again noted throughout the periphery of the lungs bilaterally. Musculoskeletal: Multifocal mixed lytic and sclerotic lesions are again noted throughout the visualized axial and appendicular skeleton, with the largest lesions in the spine again noted at C7,  T3, T6 and T9. Mild compression at T3 is again noted, similar to the prior examination, with approximately 25% loss of central vertebral body height. Large expansile lesion in the lateral aspect of the left fourth rib is also similar to the prior study. No definite new osseous lesions are noted. CT ABDOMEN FINDINGS Hepatobiliary: Heterogeneous attenuation/enhancement is again noted throughout the liver, very similar to the prior study. No discrete lesion is identified. Findings are again favored to reflect a combination of altered perfusion and heterogeneous hepatic steatosis. No intra or extrahepatic biliary ductal dilatation. Gallbladder is normal in appearance. Pancreas: No pancreatic mass. No pancreatic ductal dilatation. No pancreatic or peripancreatic fluid or inflammatory changes. Spleen: Unremarkable. Adrenals/Urinary Tract: 3.1 cm low-attenuation lesion in the lower pole of the left kidney is compatible with a simple cyst. There is minimal fullness in the left renal collecting system, which decreased compared to the prior study, without frank hydronephrosis. Right kidney is normal in appearance. Bilateral adrenal glands are not are normal in appearance. Stomach/Bowel: Normal appearance of the stomach. No pathologic dilatation of visualized portions of small bowel or colon. Vascular/Lymphatic: Aortic atherosclerosis, without aneurysm in the abdominal vasculature. No lymphadenopathy noted in the abdomen. Other: No significant volume of ascites and no pneumoperitoneum in the visualized peritoneal cavity. Musculoskeletal: Multiple lytic and sclerotic lesions appear very similar to the prior examination, without definite new lesions or compression fractures in the lumbar spine. IMPRESSION: 1. Today's study demonstrates essentially stable findings compared to the prior examination, without definite evidence of progression of metastatic disease. What appears to be treated lymphangitic spread of tumor is again  noted in the lungs bilaterally (left greater than right), and small chronic left pleural effusion appears essentially stable compared to the prior examination. Osseous lesions appear essentially unchanged. 2. Additional incidental findings, as above, similar to prior studies. Electronically Signed   By: Vinnie Langton M.D.   On: 07/12/2016 10:09   ASSESSMENT AND PLAN: This is a very pleasant 70 years old Hispanic female with metastatic non-small cell lung cancer, adenocarcinoma currently on treatment with Tarceva 150 mg by mouth daily status post 36 months of treatment and tolerating it fairly well. She is currently off treatment with Tarceva since 01/05/2016. Her recent CT scan of the chest, abdomen and pelvis showed evidence for significant disease progression in the lungs as well as liver. Her comprehensive metabolic panel also showed liver dysfunction consistent with her disease progression in the liver. Her brain MRI showed no concerning finding for disease progression in the brain. The molecular study showed T790M resistant mutation The patient was started on treatment with Tagrisso 80 mg by mouth daily on 02/03/2016. Status post 5 month of treatment.  The recent CT scan of the chest, abdomen and pelvis showed no clear evidence for disease progression. I discussed the scan results with the patient and her niece. I recommended for the patient to continue her treatment with Tagrisso. I would see her back for follow-up visit in one month for reevaluation with repeat blood work. For the metastatic bone disease, she will continue on Xgeva as previously scheduled. For the dry cough, she will continue on Hycodan 5 ML by mouth every 6 hours as needed. For the iron deficiency, I will start the patient on Integra +1 capsule by mouth daily. She was advised to call immediately if she has any concerning symptoms in the interval. The patient voices understanding of current disease status and treatment  options and is in agreement with the current care  plan.  All questions were answered. The patient knows to call the clinic with any problems, questions or concerns. We can certainly see the patient much sooner if necessary.  Disclaimer: This note was dictated with voice recognition software. Similar sounding words can inadvertently be transcribed and may not be corrected upon review.

## 2016-07-24 NOTE — Telephone Encounter (Signed)
AVS REPORT & APPT SCHD GIVEN PER LOS.

## 2016-07-25 ENCOUNTER — Other Ambulatory Visit: Payer: Self-pay | Admitting: Internal Medicine

## 2016-07-25 ENCOUNTER — Encounter: Payer: Self-pay | Admitting: Internal Medicine

## 2016-07-25 MED FILL — *TAGRISSO 80MG TABLET: 80 | 30 days supply | Qty: 30 | Fill #0

## 2016-07-25 NOTE — Progress Notes (Signed)
Delivered physician form to RN for signature for possible assistance with Tagrisso. Place note to return to me. Signature from patient needed.

## 2016-08-01 ENCOUNTER — Encounter: Payer: Self-pay | Admitting: Internal Medicine

## 2016-08-01 NOTE — Progress Notes (Signed)
Received signed physician form for [employer]. Patient just needs to sign and provide income documentation.

## 2016-08-11 ENCOUNTER — Encounter: Payer: Self-pay | Admitting: Internal Medicine

## 2016-08-11 NOTE — Progress Notes (Signed)
Patient came in to check on status of [employer] application for Tagrisso. Needed patient signature and income information. Patient completed today. Faxed application to [employer]. Fax received ok per confirmation sheet.

## 2016-08-14 ENCOUNTER — Encounter: Payer: Self-pay | Admitting: Internal Medicine

## 2016-08-14 NOTE — Progress Notes (Signed)
Received message from [employer] regarding enrollment. Returned call to [employer] and spoke with Sabena who states when trying to enroll patient in program, patient was already in system since 01/31/16 and was approved for $26,000 through 11/26/16 and they will automatically re-enroll 11/27/2016 for the patient. Pharmacy has the information and she has only used $550 thus far. She has a $0 co-pay and last time used by our pharmacy was 07/25/16. Alicia Amel will fax me a copy of the approval letter and card information. I will keep a copy, send one to HIM to scan, and provide the patient with one when she comes in with her interpreter.

## 2016-08-17 ENCOUNTER — Telehealth: Payer: Self-pay

## 2016-08-17 NOTE — Telephone Encounter (Signed)
Mailed fmla/disability paperwork to [employer] on 08/17/16

## 2016-08-23 MED FILL — *TAGRISSO 80MG TABLET: 80 | 30 days supply | Qty: 30 | Fill #1

## 2016-08-29 ENCOUNTER — Ambulatory Visit (HOSPITAL_BASED_OUTPATIENT_CLINIC_OR_DEPARTMENT_OTHER): Payer: 59 | Admitting: Internal Medicine

## 2016-08-29 ENCOUNTER — Encounter: Payer: Self-pay | Admitting: Internal Medicine

## 2016-08-29 ENCOUNTER — Other Ambulatory Visit (HOSPITAL_BASED_OUTPATIENT_CLINIC_OR_DEPARTMENT_OTHER): Payer: 59

## 2016-08-29 ENCOUNTER — Telehealth: Payer: Self-pay | Admitting: Internal Medicine

## 2016-08-29 VITALS — BP 143/50 | HR 65 | Temp 98.1°F | Resp 17 | Ht 62.0 in | Wt 140.0 lb

## 2016-08-29 DIAGNOSIS — C3432 Malignant neoplasm of lower lobe, left bronchus or lung: Secondary | ICD-10-CM | POA: Diagnosis not present

## 2016-08-29 DIAGNOSIS — E611 Iron deficiency: Secondary | ICD-10-CM | POA: Diagnosis not present

## 2016-08-29 DIAGNOSIS — C799 Secondary malignant neoplasm of unspecified site: Secondary | ICD-10-CM

## 2016-08-29 DIAGNOSIS — C3492 Malignant neoplasm of unspecified part of left bronchus or lung: Secondary | ICD-10-CM

## 2016-08-29 DIAGNOSIS — D630 Anemia in neoplastic disease: Secondary | ICD-10-CM

## 2016-08-29 DIAGNOSIS — C7951 Secondary malignant neoplasm of bone: Secondary | ICD-10-CM | POA: Diagnosis not present

## 2016-08-29 DIAGNOSIS — R05 Cough: Secondary | ICD-10-CM

## 2016-08-29 DIAGNOSIS — R932 Abnormal findings on diagnostic imaging of liver and biliary tract: Secondary | ICD-10-CM

## 2016-08-29 DIAGNOSIS — Z5111 Encounter for antineoplastic chemotherapy: Secondary | ICD-10-CM

## 2016-08-29 LAB — CBC WITH DIFFERENTIAL/PLATELET
BASO%: 0.3 % (ref 0.0–2.0)
BASOS ABS: 0 10*3/uL (ref 0.0–0.1)
EOS%: 2.9 % (ref 0.0–7.0)
Eosinophils Absolute: 0.1 10*3/uL (ref 0.0–0.5)
HEMATOCRIT: 33.3 % — AB (ref 34.8–46.6)
HGB: 10.7 g/dL — ABNORMAL LOW (ref 11.6–15.9)
LYMPH%: 18.2 % (ref 14.0–49.7)
MCH: 28.6 pg (ref 25.1–34.0)
MCHC: 32.1 g/dL (ref 31.5–36.0)
MCV: 89 fL (ref 79.5–101.0)
MONO#: 0.5 10*3/uL (ref 0.1–0.9)
MONO%: 14 % (ref 0.0–14.0)
NEUT#: 2.4 10*3/uL (ref 1.5–6.5)
NEUT%: 64.6 % (ref 38.4–76.8)
Platelets: 69 10*3/uL — ABNORMAL LOW (ref 145–400)
RBC: 3.74 10*6/uL (ref 3.70–5.45)
RDW: 15.5 % — ABNORMAL HIGH (ref 11.2–14.5)
WBC: 3.7 10*3/uL — ABNORMAL LOW (ref 3.9–10.3)
lymph#: 0.7 10*3/uL — ABNORMAL LOW (ref 0.9–3.3)

## 2016-08-29 LAB — COMPREHENSIVE METABOLIC PANEL
ALK PHOS: 79 U/L (ref 40–150)
ALT: 34 U/L (ref 0–55)
ANION GAP: 6 meq/L (ref 3–11)
AST: 35 U/L — ABNORMAL HIGH (ref 5–34)
Albumin: 3.1 g/dL — ABNORMAL LOW (ref 3.5–5.0)
BILIRUBIN TOTAL: 0.41 mg/dL (ref 0.20–1.20)
BUN: 29.8 mg/dL — ABNORMAL HIGH (ref 7.0–26.0)
CALCIUM: 9.1 mg/dL (ref 8.4–10.4)
CHLORIDE: 107 meq/L (ref 98–109)
CO2: 27 mEq/L (ref 22–29)
CREATININE: 0.8 mg/dL (ref 0.6–1.1)
EGFR: 73 mL/min/{1.73_m2} — ABNORMAL LOW (ref >90)
Glucose: 62 mg/dl — ABNORMAL LOW (ref 70–140)
Potassium: 3.9 mEq/L (ref 3.5–5.1)
Sodium: 140 mEq/L (ref 136–145)
TOTAL PROTEIN: 7.5 g/dL (ref 6.4–8.3)

## 2016-08-29 NOTE — Telephone Encounter (Signed)
Gave patient avs report and appointments for November.  °

## 2016-08-29 NOTE — Progress Notes (Signed)
Provided patient a copy of Tagrisso approval from March.

## 2016-08-29 NOTE — Progress Notes (Signed)
[facility] [phone]   [phone]  OFFICE PROGRESS NOTE  CLOWARD,DAVIS L, MD [address] [place] [address]  DIAGNOSIS: Metastatic non-small cell lung cancer, adenocarcinoma with positive EGFR mutation in exon 19 and negative ALK gene translocation diagnosed in July 2013 with bone metastasis. Now with resistant T790M mutation.  PRIOR THERAPY:  1) Status post radiotherapy to the left face rib metastasis under the care of Dr. Lisbeth Renshaw.  2) stereotactic radiotherapy to the enlarging left lower lobe lung nodule under the care of Dr. Lisbeth Renshaw. 3) treatment with Tarceva 150 mg by mouth daily, therapy beginning 07/20/2012. Status post approximately 36 months of therapy. This was discontinued secondary to disease progression  CURRENT THERAPY:  1) Tagrisso 80 mg by mouth daily started 02/03/2016. Status post 5 months of treatment. 2) Xgeva 120 mg subcutaneously every 4 weeks for bone disease.   CHEMOTHERAPY INTENT: Palliative  CURRENT # OF CHEMOTHERAPY CYCLES: 6 CURRENT ANTIEMETICS: Compazine  CURRENT SMOKING STATUS: Never smoker  ORAL CHEMOTHERAPY AND CONSENT: Tarceva  CURRENT BISPHOSPHONATES USE: Xgeva  PAIN MANAGEMENT: None  NARCOTICS INDUCED CONSTIPATION: None  LIVING WILL AND CODE STATUS: Full code  INTERVAL HISTORY: Jacqueline Leonard 70 y.o. female returns to the clinic today for monthly followup visit accompanied by her husband. The patient is currently on treatment with Tagrisso 80 mg by mouth daily status post 6 months of treatment and is tolerating it fairly well with no significant adverse effects. She denied having any significant skin rash or diarrhea. She has no significant weight loss or night sweats. She has no fever or chills, no nausea or vomiting. The patient denied having any significant chest pain, shortness of breath, cough or hemoptysis. She has no bleeding issues. She is here today for evaluation with repeat blood  work.  MEDICAL HISTORY: Past Medical History:  Diagnosis Date  . Allergy   . Anxiety   . Cancer, metastatic to bone ([facility]) 06/19/12   bx=L 5th ribmetastatic Adenocarcinoma with known lung mass  . Encounter for antineoplastic chemotherapy 02/14/2016  . External hemorrhoids   . Hyperlipidemia   . Hypertension   . Lung mass   . Radiation 07/11/12-07/24/12   Palliative lung tx 30 gray in 10 fx  . S/P radiation therapy 07/22/14-07/31/14   left lung/60gy/60f  . Status post chemoradiation    Tarceva  . Vitamin D deficiency     ALLERGIES:  is allergic to penicillins.  MEDICATIONS:  Current Outpatient Prescriptions  Medication Sig Dispense Refill  . amLODipine (NORVASC) 5 MG tablet Take 5 mg by mouth daily.    . cholecalciferol (VITAMIN D) 1000 UNITS tablet Take 1,000 Units by mouth daily.    . Clobetasol Propionate 0.05 % shampoo Reported on 01/05/2016    . FeFum-FePoly-FA-B Cmp-C-Biot (INTEGRA PLUS) CAPS Take 1 capsule by mouth every morning. 30 capsule 2  . fluocinonide (LIDEX) 0.05 % external solution Reported on 01/05/2016    . HYDROcodone-homatropine (HYCODAN) 5-1.5 MG/5ML syrup Take 5 mLs by mouth every 6 (six) hours as needed for cough. 120 mL 0  . hydrocortisone 2.5 % cream Reported on 05/16/2016    . Multiple Minerals-Vitamins (CALCIUM & VIT D3 BONE HEALTH PO) Take 750 mg by mouth 2 (two) times daily.    . potassium chloride (K-DUR) 10 MEQ tablet Take 1 tablet by mouth daily.    .Marland KitchenTAGRISSO 80 MG tablet TAKE 1 TABLET BY MOUTH ONCE DAILY 30 tablet 2  . valsartan-hydrochlorothiazide (DIOVAN-HCT) 160-25 MG per  tablet Take 1 tablet by mouth daily.     No current facility-administered medications for this visit.     SURGICAL HISTORY:  Past Surgical History:  Procedure Laterality Date  . APPENDECTOMY    . OVARIAN CYST REMOVAL    . SOFT TISSUE BIOPSY  06/19/12   L 5th rib=metastatic adenocarcinoma    REVIEW OF SYSTEMS:  A comprehensive review of systems was negative except for:  Constitutional: positive for fatigue   PHYSICAL EXAMINATION: General appearance: alert, cooperative and no distress Head: Normocephalic, without obvious abnormality, atraumatic Neck: no adenopathy, no JVD, supple, symmetrical, trachea midline and thyroid not enlarged, symmetric, no tenderness/mass/nodules Lymph nodes: Cervical, supraclavicular, and axillary nodes normal. Resp: clear to auscultation bilaterally Back: symmetric, no curvature. ROM normal. No CVA tenderness. Cardio: regular rate and rhythm, S1, S2 normal, no murmur, click, rub or gallop GI: soft, non-tender; bowel sounds normal; no masses,  no organomegaly Extremities: extremities normal, atraumatic, no cyanosis or edema Neurologic: Alert and oriented X 3, normal strength and tone. Normal symmetric reflexes. Normal coordination and gait  ECOG PERFORMANCE STATUS: 1 - Symptomatic but completely ambulatory  Blood pressure (!) 143/50, pulse 65, temperature 98.1 F (36.7 C), temperature source Oral, resp. rate 17, height 5' 2"  (1.575 m), weight 140 lb (63.5 kg), SpO2 100 %.  LABORATORY DATA: Lab Results  Component Value Date   WBC 3.7 (L) 08/29/2016   HGB 10.7 (L) 08/29/2016   HCT 33.3 (L) 08/29/2016   MCV 89.0 08/29/2016   PLT 69 (L) 08/29/2016      Chemistry      Component Value Date/Time   NA 140 07/11/2016 1453   K 3.8 07/11/2016 1453   CL 102 05/06/2013 1511   CO2 27 07/11/2016 1453   BUN 23.0 07/11/2016 1453   CREATININE 0.8 07/11/2016 1453      Component Value Date/Time   CALCIUM 9.5 07/11/2016 1453   ALKPHOS 87 07/11/2016 1453   AST 42 (H) 07/11/2016 1453   ALT 39 07/11/2016 1453   BILITOT 0.58 07/11/2016 1453       RADIOGRAPHIC STUDIES: No results found. ASSESSMENT AND PLAN: This is a very pleasant 70 years old Hispanic female with metastatic non-small cell lung cancer, adenocarcinoma currently on treatment with Tarceva 150 mg by mouth daily status post 36 months of treatment and tolerating it fairly  well. She is currently off treatment with Tarceva since 01/05/2016. Her recent CT scan of the chest, abdomen and pelvis showed evidence for significant disease progression in the lungs as well as liver. Her comprehensive metabolic panel also showed liver dysfunction consistent with her disease progression in the liver. Her brain MRI showed no concerning finding for disease progression in the brain. The molecular study showed T790M resistant mutation The patient was started on treatment with Tagrisso 80 mg by mouth daily on 02/03/2016. Status post 6 month of treatment. She is tolerating her treatment very well. I recommended for the patient to continue her treatment with Tagrisso. I would see her back for follow-up visit in one month for reevaluation with repeat blood work. For the metastatic bone disease, she will continue on Xgeva as previously scheduled. For the dry cough, she will continue on Hycodan 5 ML by mouth every 6 hours as needed. For the iron deficiency, I will start the patient on Integra +1 capsule by mouth daily. She was advised to call immediately if she has any concerning symptoms in the interval. The patient voices understanding of current disease status and treatment  options and is in agreement with the current care plan.  All questions were answered. The patient knows to call the clinic with any problems, questions or concerns. We can certainly see the patient much sooner if necessary.  Disclaimer: This note was dictated with voice recognition software. Similar sounding words can inadvertently be transcribed and may not be corrected upon review.

## 2016-09-20 MED FILL — TAGRISSO 80 MG TABLET: 80 | 30 days supply | Qty: 30 | Fill #2

## 2016-10-03 ENCOUNTER — Ambulatory Visit (HOSPITAL_BASED_OUTPATIENT_CLINIC_OR_DEPARTMENT_OTHER): Payer: 59 | Admitting: Internal Medicine

## 2016-10-03 ENCOUNTER — Ambulatory Visit (HOSPITAL_BASED_OUTPATIENT_CLINIC_OR_DEPARTMENT_OTHER): Payer: 59 | Admitting: Nurse Practitioner

## 2016-10-03 ENCOUNTER — Other Ambulatory Visit: Payer: Self-pay | Admitting: Internal Medicine

## 2016-10-03 ENCOUNTER — Telehealth: Payer: Self-pay | Admitting: Internal Medicine

## 2016-10-03 ENCOUNTER — Encounter: Payer: Self-pay | Admitting: Internal Medicine

## 2016-10-03 ENCOUNTER — Other Ambulatory Visit (HOSPITAL_BASED_OUTPATIENT_CLINIC_OR_DEPARTMENT_OTHER): Payer: 59

## 2016-10-03 VITALS — BP 137/45 | HR 70 | Temp 97.8°F | Resp 16 | Ht 62.0 in | Wt 143.0 lb

## 2016-10-03 DIAGNOSIS — Z5111 Encounter for antineoplastic chemotherapy: Secondary | ICD-10-CM

## 2016-10-03 DIAGNOSIS — R040 Epistaxis: Secondary | ICD-10-CM

## 2016-10-03 DIAGNOSIS — R05 Cough: Secondary | ICD-10-CM | POA: Diagnosis not present

## 2016-10-03 DIAGNOSIS — C3492 Malignant neoplasm of unspecified part of left bronchus or lung: Secondary | ICD-10-CM

## 2016-10-03 DIAGNOSIS — C3490 Malignant neoplasm of unspecified part of unspecified bronchus or lung: Secondary | ICD-10-CM | POA: Diagnosis not present

## 2016-10-03 DIAGNOSIS — C787 Secondary malignant neoplasm of liver and intrahepatic bile duct: Secondary | ICD-10-CM | POA: Diagnosis not present

## 2016-10-03 DIAGNOSIS — C7951 Secondary malignant neoplasm of bone: Secondary | ICD-10-CM

## 2016-10-03 DIAGNOSIS — C7952 Secondary malignant neoplasm of bone marrow: Secondary | ICD-10-CM

## 2016-10-03 DIAGNOSIS — D509 Iron deficiency anemia, unspecified: Secondary | ICD-10-CM

## 2016-10-03 DIAGNOSIS — C799 Secondary malignant neoplasm of unspecified site: Secondary | ICD-10-CM

## 2016-10-03 LAB — COMPREHENSIVE METABOLIC PANEL
ALBUMIN: 3.2 g/dL — AB (ref 3.5–5.0)
ALK PHOS: 83 U/L (ref 40–150)
ALT: 29 U/L (ref 0–55)
AST: 33 U/L (ref 5–34)
Anion Gap: 7 mEq/L (ref 3–11)
BILIRUBIN TOTAL: 0.5 mg/dL (ref 0.20–1.20)
BUN: 33.1 mg/dL — AB (ref 7.0–26.0)
CO2: 27 meq/L (ref 22–29)
CREATININE: 0.9 mg/dL (ref 0.6–1.1)
Calcium: 9.3 mg/dL (ref 8.4–10.4)
Chloride: 106 mEq/L (ref 98–109)
EGFR: 61 mL/min/{1.73_m2} — ABNORMAL LOW (ref >90)
GLUCOSE: 89 mg/dL (ref 70–140)
Potassium: 4.9 mEq/L (ref 3.5–5.1)
SODIUM: 140 meq/L (ref 136–145)
TOTAL PROTEIN: 7.7 g/dL (ref 6.4–8.3)

## 2016-10-03 LAB — CBC WITH DIFFERENTIAL/PLATELET
BASO%: 0.2 % (ref 0.0–2.0)
Basophils Absolute: 0 10*3/uL (ref 0.0–0.1)
EOS ABS: 0.1 10*3/uL (ref 0.0–0.5)
EOS%: 1.8 % (ref 0.0–7.0)
HCT: 32.8 % — ABNORMAL LOW (ref 34.8–46.6)
HEMOGLOBIN: 10.7 g/dL — AB (ref 11.6–15.9)
LYMPH%: 18.8 % (ref 14.0–49.7)
MCH: 29.2 pg (ref 25.1–34.0)
MCHC: 32.6 g/dL (ref 31.5–36.0)
MCV: 89.4 fL (ref 79.5–101.0)
MONO#: 0.4 10*3/uL (ref 0.1–0.9)
MONO%: 10 % (ref 0.0–14.0)
NEUT%: 69.2 % (ref 38.4–76.8)
NEUTROS ABS: 3.1 10*3/uL (ref 1.5–6.5)
Platelets: 67 10*3/uL — ABNORMAL LOW (ref 145–400)
RBC: 3.67 10*6/uL — AB (ref 3.70–5.45)
RDW: 15.2 % — AB (ref 11.2–14.5)
WBC: 4.4 10*3/uL (ref 3.9–10.3)
lymph#: 0.8 10*3/uL — ABNORMAL LOW (ref 0.9–3.3)

## 2016-10-03 MED ORDER — INTEGRA PLUS PO CAPS: 1.0000 | ORAL_CAPSULE | Freq: Every morning | ORAL | 2 refills | Status: DC

## 2016-10-03 NOTE — Progress Notes (Signed)
[facility] [phone]   [phone]) [phone]  OFFICE PROGRESS NOTE  CLOWARD,DAVIS L, MD [address] [place] [address]  DIAGNOSIS: Metastatic non-small cell lung cancer, adenocarcinoma with positive EGFR mutation in exon 19 and negative ALK gene translocation diagnosed in July 2013 with bone metastasis. Now with resistant T790M mutation.  PRIOR THERAPY:  1) Status post radiotherapy to the left face rib metastasis under the care of Dr. Lisbeth Renshaw.  2) stereotactic radiotherapy to the enlarging left lower lobe lung nodule under the care of Dr. Lisbeth Renshaw. 3) treatment with Tarceva 150 mg by mouth daily, therapy beginning 07/20/2012. Status post approximately 36 months of therapy. This was discontinued secondary to disease progression  CURRENT THERAPY:  1) Tagrisso 80 mg by mouth daily started 02/03/2016. Status post 7 months of treatment. 2) Xgeva 120 mg subcutaneously every 4 weeks for bone disease.   CHEMOTHERAPY INTENT: Palliative  CURRENT # OF CHEMOTHERAPY CYCLES: 8 CURRENT ANTIEMETICS: Compazine  CURRENT SMOKING STATUS: Never smoker  ORAL CHEMOTHERAPY AND CONSENT: Tarceva  CURRENT BISPHOSPHONATES USE: Xgeva  PAIN MANAGEMENT: None  NARCOTICS INDUCED CONSTIPATION: None  LIVING WILL AND CODE STATUS: Full code  INTERVAL HISTORY: Jacqueline Leonard 70 y.o. female returns to the clinic today for monthly followup visit accompanied by her niece, [name]. The patient is currently on treatment with Tagrisso 80 mg by mouth daily status post 7 months of treatment and is tolerating it fairly well with no significant adverse effects. She is doing very well with this treatment except for mild fatigue. She denied having any significant skin rash or diarrhea. She has no significant weight loss or night sweats. She has no fever or chills, no nausea or vomiting. The patient denied having any significant chest pain, shortness of breath, cough or hemoptysis. She has no  bleeding issues. She is here today for evaluation with repeat blood work.  MEDICAL HISTORY: Past Medical History:  Diagnosis Date  . Allergy   . Anxiety   . Cancer, metastatic to bone ([facility]) 06/19/12   bx=L 5th ribmetastatic Adenocarcinoma with known lung mass  . Encounter for antineoplastic chemotherapy 02/14/2016  . External hemorrhoids   . Hyperlipidemia   . Hypertension   . Lung mass   . Radiation 07/11/12-07/24/12   Palliative lung tx 30 gray in 10 fx  . S/P radiation therapy 07/22/14-07/31/14   left lung/60gy/15f  . Status post chemoradiation    Tarceva  . Vitamin D deficiency     ALLERGIES:  is allergic to penicillins.  MEDICATIONS:  Current Outpatient Prescriptions  Medication Sig Dispense Refill  . amLODipine (NORVASC) 5 MG tablet Take 5 mg by mouth daily.    . cholecalciferol (VITAMIN D) 1000 UNITS tablet Take 1,000 Units by mouth daily.    . Clobetasol Propionate 0.05 % shampoo Reported on 01/05/2016    . FeFum-FePoly-FA-B Cmp-C-Biot (INTEGRA PLUS) CAPS Take 1 capsule by mouth every morning. 30 capsule 2  . fluocinonide (LIDEX) 0.05 % external solution Reported on 01/05/2016    . HYDROcodone-homatropine (HYCODAN) 5-1.5 MG/5ML syrup Take 5 mLs by mouth every 6 (six) hours as needed for cough. 120 mL 0  . hydrocortisone 2.5 % cream Reported on 05/16/2016    . Multiple Minerals-Vitamins (CALCIUM & VIT D3 BONE HEALTH PO) Take 750 mg by mouth 2 (two) times daily.    . potassium chloride (K-DUR) 10 MEQ tablet Take 1 tablet by mouth daily.    .Marland KitchenTAGRISSO 80 MG tablet TAKE 1 TABLET BY  MOUTH ONCE DAILY 30 tablet 2  . valsartan-hydrochlorothiazide (DIOVAN-HCT) 160-25 MG per tablet Take 1 tablet by mouth daily.     No current facility-administered medications for this visit.     SURGICAL HISTORY:  Past Surgical History:  Procedure Laterality Date  . APPENDECTOMY    . OVARIAN CYST REMOVAL    . SOFT TISSUE BIOPSY  06/19/12   L 5th rib=metastatic adenocarcinoma    REVIEW OF SYSTEMS:   A comprehensive review of systems was negative except for: Constitutional: positive for fatigue   PHYSICAL EXAMINATION: General appearance: alert, cooperative and no distress Head: Normocephalic, without obvious abnormality, atraumatic Neck: no adenopathy, no JVD, supple, symmetrical, trachea midline and thyroid not enlarged, symmetric, no tenderness/mass/nodules Lymph nodes: Cervical, supraclavicular, and axillary nodes normal. Resp: clear to auscultation bilaterally Back: symmetric, no curvature. ROM normal. No CVA tenderness. Cardio: regular rate and rhythm, S1, S2 normal, no murmur, click, rub or gallop GI: soft, non-tender; bowel sounds normal; no masses,  no organomegaly Extremities: extremities normal, atraumatic, no cyanosis or edema Neurologic: Alert and oriented X 3, normal strength and tone. Normal symmetric reflexes. Normal coordination and gait  ECOG PERFORMANCE STATUS: 1 - Symptomatic but completely ambulatory  Blood pressure (!) 137/45, pulse 70, temperature 97.8 F (36.6 C), temperature source Oral, resp. rate 16, height 5' 2"  (1.575 m), weight 143 lb (64.9 kg), SpO2 100 %.  LABORATORY DATA: Lab Results  Component Value Date   WBC 4.4 10/03/2016   HGB 10.7 (L) 10/03/2016   HCT 32.8 (L) 10/03/2016   MCV 89.4 10/03/2016   PLT 67 (L) 10/03/2016      Chemistry      Component Value Date/Time   NA 140 10/03/2016 1516   K 4.9 10/03/2016 1516   CL 102 05/06/2013 1511   CO2 27 10/03/2016 1516   BUN 33.1 (H) 10/03/2016 1516   CREATININE 0.9 10/03/2016 1516      Component Value Date/Time   CALCIUM 9.3 10/03/2016 1516   ALKPHOS 83 10/03/2016 1516   AST 33 10/03/2016 1516   ALT 29 10/03/2016 1516   BILITOT 0.50 10/03/2016 1516       RADIOGRAPHIC STUDIES: No results found. ASSESSMENT AND PLAN: This is a very pleasant 70 years old Hispanic female with metastatic non-small cell lung cancer, adenocarcinoma currently on treatment with Tarceva 150 mg by mouth daily  status post 36 months of treatment and tolerating it fairly well. She is currently off treatment with Tarceva since 01/05/2016. Her recent CT scan of the chest, abdomen and pelvis showed evidence for significant disease progression in the lungs as well as liver. Her comprehensive metabolic panel also showed liver dysfunction consistent with her disease progression in the liver. Her brain MRI showed no concerning finding for disease progression in the brain. The molecular study showed T790M resistant mutation The patient was started on treatment with Tagrisso 80 mg by mouth daily on 02/03/2016. Status post 7  month of treatment. She is tolerating her treatment very well. I recommended for the patient to continue her treatment with Tagrisso. I would see her back for follow-up visit in one month for reevaluation with repeat blood work as well as CT scan of the chest, abdomen and pelvis for restaging of her disease. For the metastatic bone disease, she will continue on Xgeva as previously scheduled. For the dry cough, she will continue on Hycodan 5 ML by mouth every 6 hours as needed. For the iron deficiency, she will continue on Integra +1 capsule by  mouth daily. She was advised to call immediately if she has any concerning symptoms in the interval. The patient voices understanding of current disease status and treatment options and is in agreement with the current care plan.  All questions were answered. The patient knows to call the clinic with any problems, questions or concerns. We can certainly see the patient much sooner if necessary.  Disclaimer: This note was dictated with voice recognition software. Similar sounding words can inadvertently be transcribed and may not be corrected upon review.

## 2016-10-03 NOTE — Telephone Encounter (Signed)
Gave patient avs report and appointments for December. Central radiology will call re scan.  °

## 2016-10-04 ENCOUNTER — Encounter: Payer: Self-pay | Admitting: Nurse Practitioner

## 2016-10-04 DIAGNOSIS — R040 Epistaxis: Secondary | ICD-10-CM | POA: Insufficient documentation

## 2016-10-04 NOTE — Assessment & Plan Note (Signed)
Patient had been in to see Dr. Julien Nordmann for a regularly scheduled visit today; and was in the scheduling department of the [facility] when she developed a nosebleed after blowing her nose.  Brief exam today revealed.  Patient had a very small amount of bright red blood to the right near after initially blowing her nose.  Labs today, the platelets were down to 67.  Explained to the patient and her knees that patient should very gently try to blow her nose or to avoid pulling her nose altogether and her platelets are low without possible.  She was also advised how to manage any future nosebleeds.  She was advised to hold steady pressure to her nose without letting up approximately 10 minutes if she develops another nosebleed.  Also, explained that her nasal mucosa may be dry; and she can apply either Vaseline or Aquaphor to the inside of her naris to moisturize.  Patient was advised to call/return to the emergency department for any worsening symptoms whatsoever.  Patient stated understanding of all instructions; and patient's niece also explained all details of instructions in Spanish as well.

## 2016-10-04 NOTE — Assessment & Plan Note (Signed)
Patient continues to take Tagrisso oral therapy as directed on a daily basis.  She also continues with the Xgeva injections as directed.  Patient is scheduled for labs only on 10/31/2016.  She is scheduled for follow-up visit with Dr. Julien Nordmann on 11/06/2016.  She is scheduled for a restaging CT prior to her visit with Dr. Julien Nordmann on 11/06/2016- which has not been scheduled as of yet.

## 2016-10-04 NOTE — Progress Notes (Signed)
SYMPTOM MANAGEMENT CLINIC    Chief Complaint: Nosebleed  HPI:  Jacqueline Leonard 70 y.o. female diagnosed with lung cancer with bone metastasis.  Currently undergoing Tagrisso oral therapy and Xgeva injections.    No history exists.    Review of Systems  HENT: Positive for nosebleeds.   All other systems reviewed and are negative.   Past Medical History:  Diagnosis Date  . Allergy   . Anxiety   . Cancer, metastatic to bone ([place]) 06/19/12   bx=L 5th ribmetastatic Adenocarcinoma with known lung mass  . Encounter for antineoplastic chemotherapy 02/14/2016  . External hemorrhoids   . Hyperlipidemia   . Hypertension   . Lung mass   . Radiation 07/11/12-07/24/12   Palliative lung tx 30 gray in 10 fx  . S/P radiation therapy 07/22/14-07/31/14   left lung/60gy/49f  . Status post chemoradiation    Tarceva  . Vitamin D deficiency     Past Surgical History:  Procedure Laterality Date  . APPENDECTOMY    . OVARIAN CYST REMOVAL    . SOFT TISSUE BIOPSY  06/19/12   L 5th rib=metastatic adenocarcinoma    has Metastatic adenocarcinoma, pathology 06/19/12; Hypertension; Hyperlipidemia; Vitamin D deficiency; Secondary malignant neoplasm of bone and bone marrow ([place]; Vitamin B12 deficiency anemia; Pulmonary metastasis ([facility]; Anemia in neoplastic disease; Encounter for antineoplastic chemotherapy; Bone metastases ([facility]; Adenocarcinoma of left lung, stage 4 ([facility]); and Epistaxis on her problem list.    is allergic to penicillins.    Medication List       Accurate as of 10/03/16 11:59 PM. Always use your most recent med list.          amLODipine 5 MG tablet Commonly known as:  NORVASC Take 5 mg by mouth daily.   CALCIUM & VIT D3 BONE HEALTH PO Take 750 mg by mouth 2 (two) times daily.   cholecalciferol 1000 units tablet Commonly known as:  VITAMIN D Take 1,000 Units by mouth daily.   Clobetasol Propionate 0.05 % shampoo Reported on 01/05/2016   fluocinonide 0.05 % external  solution Commonly known as:  LIDEX Reported on 01/05/2016   HYDROcodone-homatropine 5-1.5 MG/5ML syrup Commonly known as:  HYCODAN Take 5 mLs by mouth every 6 (six) hours as needed for cough.   hydrocortisone 2.5 % cream Reported on 05/16/2016   INTEGRA PLUS Caps Take 1 capsule by mouth every morning.   potassium chloride 10 MEQ tablet Commonly known as:  K-DUR Take 1 tablet by mouth daily.   TAGRISSO 80 MG tablet Generic drug:  osimertinib mesylate TAKE 1 TABLET BY MOUTH ONCE DAILY   valsartan-hydrochlorothiazide 160-25 MG tablet Commonly known as:  DIOVAN-HCT Take 1 tablet by mouth daily.        PHYSICAL EXAMINATION  Oncology Vitals 10/03/2016 08/29/2016  Height 158 cm 158 cm  Weight 64.864 kg 63.504 kg  Weight (lbs) 143 lbs 140 lbs  BMI (kg/m2) 26.16 kg/m2 25.61 kg/m2  Temp 97.8 98.1  Pulse 70 65  Resp 16 17  SpO2 100 100  BSA (m2) 1.68 m2 1.67 m2   BP Readings from Last 2 Encounters:  10/03/16 (!) 137/45  08/29/16 (!) 143/50    Physical Exam  Constitutional: She is oriented to person, place, and time and well-developed, well-nourished, and in no distress.  HENT:  Head: Normocephalic and atraumatic.  Trace amount of bright red bleeding to the right near only.  Eyes: Conjunctivae and EOM are normal. Pupils are equal, round, and reactive to light.  Neck: Normal range  of motion.  Pulmonary/Chest: Effort normal. No respiratory distress.  Musculoskeletal: Normal range of motion.  Neurological: She is alert and oriented to person, place, and time. Gait normal.  Skin: Skin is warm and dry.  Psychiatric: Affect normal.  Nursing note and vitals reviewed.   LABORATORY DATA:. Appointment on 10/03/2016  Component Date Value Ref Range Status  . WBC 10/03/2016 4.4  3.9 - 10.3 10e3/uL Final  . NEUT# 10/03/2016 3.1  1.5 - 6.5 10e3/uL Final  . HGB 10/03/2016 10.7* 11.6 - 15.9 g/dL Final  . HCT 10/03/2016 32.8* 34.8 - 46.6 % Final  . Platelets 10/03/2016 67* 145 - 400  10e3/uL Final  . MCV 10/03/2016 89.4  79.5 - 101.0 fL Final  . MCH 10/03/2016 29.2  25.1 - 34.0 pg Final  . MCHC 10/03/2016 32.6  31.5 - 36.0 g/dL Final  . RBC 10/03/2016 3.67* 3.70 - 5.45 10e6/uL Final  . RDW 10/03/2016 15.2* 11.2 - 14.5 % Final  . lymph# 10/03/2016 0.8* 0.9 - 3.3 10e3/uL Final  . MONO# 10/03/2016 0.4  0.1 - 0.9 10e3/uL Final  . Eosinophils Absolute 10/03/2016 0.1  0.0 - 0.5 10e3/uL Final  . Basophils Absolute 10/03/2016 0.0  0.0 - 0.1 10e3/uL Final  . NEUT% 10/03/2016 69.2  38.4 - 76.8 % Final  . LYMPH% 10/03/2016 18.8  14.0 - 49.7 % Final  . MONO% 10/03/2016 10.0  0.0 - 14.0 % Final  . EOS% 10/03/2016 1.8  0.0 - 7.0 % Final  . BASO% 10/03/2016 0.2  0.0 - 2.0 % Final  . Sodium 10/03/2016 140  136 - 145 mEq/L Final  . Potassium 10/03/2016 4.9  3.5 - 5.1 mEq/L Final  . Chloride 10/03/2016 106  98 - 109 mEq/L Final  . CO2 10/03/2016 27  22 - 29 mEq/L Final  . Glucose 10/03/2016 89  70 - 140 mg/dl Final  . BUN 10/03/2016 33.1* 7.0 - 26.0 mg/dL Final  . Creatinine 10/03/2016 0.9  0.6 - 1.1 mg/dL Final  . Total Bilirubin 10/03/2016 0.50  0.20 - 1.20 mg/dL Final  . Alkaline Phosphatase 10/03/2016 83  40 - 150 U/L Final  . AST 10/03/2016 33  5 - 34 U/L Final  . ALT 10/03/2016 29  0 - 55 U/L Final  . Total Protein 10/03/2016 7.7  6.4 - 8.3 g/dL Final  . Albumin 10/03/2016 3.2* 3.5 - 5.0 g/dL Final  . Calcium 10/03/2016 9.3  8.4 - 10.4 mg/dL Final  . Anion Gap 10/03/2016 7  3 - 11 mEq/L Final  . EGFR 10/03/2016 61* >90 ml/min/1.73 m2 Final    RADIOGRAPHIC STUDIES: No results found.  ASSESSMENT/PLAN:    Epistaxis Patient had been in to see Dr. Julien Nordmann for a regularly scheduled visit today; and was in the scheduling department of the [facility] when she developed a nosebleed after blowing her nose.  Brief exam today revealed.  Patient had a very small amount of bright red blood to the right near after initially blowing her nose.  Labs today, the platelets were down  to 67.  Explained to the patient and her knees that patient should very gently try to blow her nose or to avoid pulling her nose altogether and her platelets are low without possible.  She was also advised how to manage any future nosebleeds.  She was advised to hold steady pressure to her nose without letting up approximately 10 minutes if she develops another nosebleed.  Also, explained that her nasal mucosa may be dry; and  she can apply either Vaseline or Aquaphor to the inside of her naris to moisturize.  Patient was advised to call/return to the emergency department for any worsening symptoms whatsoever.  Patient stated understanding of all instructions; and patient's niece also explained all details of instructions in Spanish as well.  Adenocarcinoma of left lung, stage 4 ([place]) Patient continues to take Tagrisso oral therapy as directed on a daily basis.  She also continues with the Xgeva injections as directed.  Patient is scheduled for labs only on 10/31/2016.  She is scheduled for follow-up visit with Dr. Julien Nordmann on 11/06/2016.  She is scheduled for a restaging CT prior to her visit with Dr. Julien Nordmann on 11/06/2016- which has not been scheduled as of yet.   Patient stated understanding of all instructions; and was in agreement with this plan of care. The patient knows to call the clinic with any problems, questions or concerns.   Total time spent with patient was 10 minutes;  with greater than 75 percent of that time spent in face to face counseling regarding patient's symptoms,  and coordination of care and follow up.  Disclaimer:This dictation was prepared with Dragon/digital dictation along with [employer]. Any transcriptional errors that result from this process are unintentional.  Drue Second, NP 10/04/2016

## 2016-10-09 ENCOUNTER — Other Ambulatory Visit: Payer: Self-pay | Admitting: Medical Oncology

## 2016-10-09 DIAGNOSIS — C3490 Malignant neoplasm of unspecified part of unspecified bronchus or lung: Secondary | ICD-10-CM

## 2016-10-09 MED ORDER — OSIMERTINIB MESYLATE 80 MG PO TABS: 80.0000 mg | ORAL_TABLET | Freq: Every day | ORAL | 0 refills | Status: DC

## 2016-10-13 ENCOUNTER — Telehealth: Payer: Self-pay | Admitting: *Deleted

## 2016-10-13 MED FILL — TAGRISSO 80 MG TABLET: 80 | 30 days supply | Qty: 30 | Fill #0

## 2016-10-13 NOTE — Telephone Encounter (Signed)
Pt here as walk in clinic requesting Tagrisso be filled for more than 30 days. Spoke with Charlies Silvers at [facility] who advised pt insurance will not cover more than 30 days. Pt will have enough medicine until last part of December. Pt advised she will no longer have insurance as of tomorrow. Medicaid will not begin until Jan. Encouraged pt to contact manufacturer and ask about patient assistance programs. Pt verbalized understanding.

## 2016-10-24 ENCOUNTER — Telehealth: Payer: Self-pay

## 2016-10-24 NOTE — Telephone Encounter (Signed)
Pt called about changing her appt  Called pt back. She wants to change 12/11 appt wth Dr Julien Nordmann to 12/12 b/c she wants her niece with her for interpretation. She refused an interpreter from [facility]. S/w Diane and inbasket sent for "next available". Pt preference would be 12/15 or 12/22 and those mentioned in inbasket.

## 2016-10-25 ENCOUNTER — Telehealth: Payer: Self-pay | Admitting: Internal Medicine

## 2016-10-25 NOTE — Telephone Encounter (Signed)
Called to inform pt of r/s MD appt to 12/22 at 1015 am per LOS

## 2016-10-30 ENCOUNTER — Other Ambulatory Visit: Payer: Self-pay | Admitting: *Deleted

## 2016-10-31 ENCOUNTER — Other Ambulatory Visit (HOSPITAL_BASED_OUTPATIENT_CLINIC_OR_DEPARTMENT_OTHER): Payer: 59

## 2016-10-31 ENCOUNTER — Ambulatory Visit (HOSPITAL_COMMUNITY)
Admission: RE | Admit: 2016-10-31 | Discharge: 2016-10-31 | Disposition: A | Discharge status: Home / Self Care | Payer: 59 | Source: Ambulatory Visit | Attending: Internal Medicine | Admitting: Internal Medicine

## 2016-10-31 DIAGNOSIS — J90 Pleural effusion, not elsewhere classified: Secondary | ICD-10-CM | POA: Insufficient documentation

## 2016-10-31 DIAGNOSIS — C799 Secondary malignant neoplasm of unspecified site: Secondary | ICD-10-CM

## 2016-10-31 DIAGNOSIS — Z5111 Encounter for antineoplastic chemotherapy: Secondary | ICD-10-CM | POA: Diagnosis not present

## 2016-10-31 DIAGNOSIS — C7951 Secondary malignant neoplasm of bone: Secondary | ICD-10-CM

## 2016-10-31 DIAGNOSIS — C3492 Malignant neoplasm of unspecified part of left bronchus or lung: Secondary | ICD-10-CM | POA: Diagnosis not present

## 2016-10-31 DIAGNOSIS — C3432 Malignant neoplasm of lower lobe, left bronchus or lung: Secondary | ICD-10-CM

## 2016-10-31 DIAGNOSIS — C7952 Secondary malignant neoplasm of bone marrow: Secondary | ICD-10-CM

## 2016-10-31 LAB — CBC WITH DIFFERENTIAL/PLATELET
BASO%: 0.3 % (ref 0.0–2.0)
BASOS ABS: 0 10*3/uL (ref 0.0–0.1)
EOS%: 2.4 % (ref 0.0–7.0)
Eosinophils Absolute: 0.1 10*3/uL (ref 0.0–0.5)
HEMATOCRIT: 35.2 % (ref 34.8–46.6)
HGB: 11.3 g/dL — ABNORMAL LOW (ref 11.6–15.9)
LYMPH#: 0.8 10*3/uL — AB (ref 0.9–3.3)
LYMPH%: 19.4 % (ref 14.0–49.7)
MCH: 29 pg (ref 25.1–34.0)
MCHC: 32 g/dL (ref 31.5–36.0)
MCV: 90.8 fL (ref 79.5–101.0)
MONO#: 0.4 10*3/uL (ref 0.1–0.9)
MONO%: 9.7 % (ref 0.0–14.0)
NEUT#: 2.8 10*3/uL (ref 1.5–6.5)
NEUT%: 68.2 % (ref 38.4–76.8)
PLATELETS: 77 10*3/uL — AB (ref 145–400)
RBC: 3.88 10*6/uL (ref 3.70–5.45)
RDW: 15.7 % — ABNORMAL HIGH (ref 11.2–14.5)
WBC: 4.1 10*3/uL (ref 3.9–10.3)

## 2016-10-31 LAB — COMPREHENSIVE METABOLIC PANEL
ALT: 37 U/L (ref 0–55)
ANION GAP: 9 meq/L (ref 3–11)
AST: 41 U/L — ABNORMAL HIGH (ref 5–34)
Albumin: 3.3 g/dL — ABNORMAL LOW (ref 3.5–5.0)
Alkaline Phosphatase: 87 U/L (ref 40–150)
BUN: 27.9 mg/dL — ABNORMAL HIGH (ref 7.0–26.0)
CALCIUM: 9.4 mg/dL (ref 8.4–10.4)
CHLORIDE: 103 meq/L (ref 98–109)
CO2: 28 mEq/L (ref 22–29)
Creatinine: 0.8 mg/dL (ref 0.6–1.1)
EGFR: 71 mL/min/{1.73_m2} — AB (ref >90)
Glucose: 86 mg/dl (ref 70–140)
POTASSIUM: 3.9 meq/L (ref 3.5–5.1)
Sodium: 139 mEq/L (ref 136–145)
Total Bilirubin: 0.66 mg/dL (ref 0.20–1.20)
Total Protein: 8.1 g/dL (ref 6.4–8.3)

## 2016-10-31 MED ORDER — SODIUM CHLORIDE 0.9 % IJ SOLN
INTRAMUSCULAR | Status: DC
Start: 2016-10-31 — End: 2016-11-01
  Filled 2016-10-31: qty 50

## 2016-10-31 MED ORDER — IOPAMIDOL (ISOVUE-300) INJECTION 61%
INTRAVENOUS | Status: AC
  Filled 2016-10-31: qty 100

## 2016-10-31 MED ORDER — IOPAMIDOL (ISOVUE-300) INJECTION 61%
100.0000 mL | Freq: Once | INTRAVENOUS | Status: AC | PRN
  Administered 2016-10-31: 100 mL via INTRAVENOUS

## 2016-11-06 ENCOUNTER — Ambulatory Visit: Payer: 59 | Admitting: Internal Medicine

## 2016-11-09 MED FILL — TAGRISSO 80 MG TABLET: 80 | 30 days supply | Qty: 30 | Fill #1

## 2016-11-17 ENCOUNTER — Ambulatory Visit (HOSPITAL_BASED_OUTPATIENT_CLINIC_OR_DEPARTMENT_OTHER): Payer: 59 | Admitting: Internal Medicine

## 2016-11-17 ENCOUNTER — Telehealth: Payer: Self-pay | Admitting: Internal Medicine

## 2016-11-17 ENCOUNTER — Encounter: Payer: Self-pay | Admitting: Internal Medicine

## 2016-11-17 VITALS — BP 147/43 | HR 64 | Temp 97.6°F | Resp 16 | Ht 62.0 in | Wt 147.8 lb

## 2016-11-17 DIAGNOSIS — D696 Thrombocytopenia, unspecified: Secondary | ICD-10-CM | POA: Diagnosis not present

## 2016-11-17 DIAGNOSIS — C3490 Malignant neoplasm of unspecified part of unspecified bronchus or lung: Secondary | ICD-10-CM | POA: Diagnosis not present

## 2016-11-17 DIAGNOSIS — C7952 Secondary malignant neoplasm of bone marrow: Secondary | ICD-10-CM

## 2016-11-17 DIAGNOSIS — C3492 Malignant neoplasm of unspecified part of left bronchus or lung: Secondary | ICD-10-CM

## 2016-11-17 DIAGNOSIS — C799 Secondary malignant neoplasm of unspecified site: Secondary | ICD-10-CM

## 2016-11-17 DIAGNOSIS — C7951 Secondary malignant neoplasm of bone: Secondary | ICD-10-CM

## 2016-11-17 DIAGNOSIS — D630 Anemia in neoplastic disease: Secondary | ICD-10-CM | POA: Diagnosis not present

## 2016-11-17 DIAGNOSIS — Z5111 Encounter for antineoplastic chemotherapy: Secondary | ICD-10-CM

## 2016-11-17 NOTE — Telephone Encounter (Signed)
Appointments scheduled per 12/22 LOS. Patient given AVS report and calendars with future scheduled appointments. °

## 2016-11-17 NOTE — Progress Notes (Signed)
[facility] [phone]   [phone]  OFFICE PROGRESS NOTE  CLOWARD,DAVIS L, MD [address] [place] [address]  DIAGNOSIS: Metastatic non-small cell lung cancer, adenocarcinoma with positive EGFR mutation in exon 19 and development of resistant T790M mutation. This was initially diagnosed in July 2016.  PRIOR THERAPY: 1) Status post radiotherapy to the left face rib metastasis under the care of Dr. Lisbeth Renshaw.  2) stereotactic radiotherapy to the enlarging left lower lobe lung nodule under the care of Dr. Lisbeth Renshaw. 3) treatment with Tarceva 150 mg by mouth daily, therapy beginning 07/20/2012. Status post approximately 36 months of therapy. This was discontinued secondary to disease progression  CURRENT THERAPY: 1) prednisone 80 mg by mouth daily started 02/03/2016 status post more than 8 months of treatment. 2) Xgeva 120 mg subcutaneously every 4 weeks for bone metastasis.  INTERVAL HISTORY: Mylynn Radovich 70 y.o. female returns to the clinic today for follow-up visit accompanied by her niece, [name]. The patient is feeling fine today was no specific complaints except for mild fatigue. She is tolerating her current treatment with Tagrisso well with no significant adverse effects. She denied having any significant skin rash, nausea, vomiting, diarrhea or constipation. She has no chest pain, shortness of breath, cough or hemoptysis. She has no headache or visual changes. She has no significant weight loss or night sweats. She had repeat CT scan of the chest, abdomen and pelvis performed recently and she is here for evaluation and discussion of her scan results and treatment options.  MEDICAL HISTORY: Past Medical History:  Diagnosis Date  . Allergy   . Anxiety   . Cancer, metastatic to bone ([place]) 06/19/12   bx=L 5th ribmetastatic Adenocarcinoma with known lung mass  . Encounter for antineoplastic chemotherapy 02/14/2016  . External hemorrhoids   .  Hyperlipidemia   . Hypertension   . Lung mass   . Radiation 07/11/12-07/24/12   Palliative lung tx 30 gray in 10 fx  . S/P radiation therapy 07/22/14-07/31/14   left lung/60gy/54f  . Status post chemoradiation    Tarceva  . Vitamin D deficiency     ALLERGIES:  is allergic to penicillins.  MEDICATIONS:  Current Outpatient Prescriptions  Medication Sig Dispense Refill  . cholecalciferol (VITAMIN D) 1000 UNITS tablet Take 1,000 Units by mouth daily.    .Marland KitchenFeFum-FePoly-FA-B Cmp-C-Biot (INTEGRA PLUS) CAPS Take 1 capsule by mouth every morning. 30 capsule 2  . HYDROcodone-homatropine (HYCODAN) 5-1.5 MG/5ML syrup Take 5 mLs by mouth every 6 (six) hours as needed for cough. 120 mL 0  . hydrocortisone 2.5 % cream Reported on 05/16/2016    . Multiple Minerals-Vitamins (CALCIUM & VIT D3 BONE HEALTH PO) Take 750 mg by mouth 2 (two) times daily.    .Marland Kitchenosimertinib mesylate (TAGRISSO) 80 MG tablet Take 1 tablet (80 mg total) by mouth daily. 90 tablet 0  . potassium chloride (K-DUR) 10 MEQ tablet Take 1 tablet by mouth daily.    .Marland KitchenamLODipine (NORVASC) 5 MG tablet Take 5 mg by mouth daily.    . Clobetasol Propionate 0.05 % shampoo Reported on 01/05/2016    . fluocinonide (LIDEX) 0.05 % external solution Reported on 01/05/2016    . valsartan-hydrochlorothiazide (DIOVAN-HCT) 160-25 MG per tablet Take 1 tablet by mouth daily.     No current facility-administered medications for this visit.     SURGICAL HISTORY:  Past Surgical History:  Procedure Laterality Date  . APPENDECTOMY    . OVARIAN CYST REMOVAL    .  SOFT TISSUE BIOPSY  06/19/12   L 5th rib=metastatic adenocarcinoma    REVIEW OF SYSTEMS:  Constitutional: positive for fatigue Eyes: negative Ears, nose, mouth, throat, and face: negative Respiratory: negative Cardiovascular: negative Gastrointestinal: negative Genitourinary:negative Integument/breast: negative Hematologic/lymphatic: negative Musculoskeletal:negative Neurological:  negative Behavioral/Psych: negative Endocrine: negative Allergic/Immunologic: negative   PHYSICAL EXAMINATION: General appearance: alert, cooperative and fatigued Head: Normocephalic, without obvious abnormality, atraumatic Neck: no adenopathy, no JVD, supple, symmetrical, trachea midline and thyroid not enlarged, symmetric, no tenderness/mass/nodules Lymph nodes: Cervical, supraclavicular, and axillary nodes normal. Resp: clear to auscultation bilaterally Back: symmetric, no curvature. ROM normal. No CVA tenderness. Cardio: regular rate and rhythm, S1, S2 normal, no murmur, click, rub or gallop GI: soft, non-tender; bowel sounds normal; no masses,  no organomegaly Extremities: extremities normal, atraumatic, no cyanosis or edema Neurologic: Alert and oriented X 3, normal strength and tone. Normal symmetric reflexes. Normal coordination and gait  ECOG PERFORMANCE STATUS: 1 - Symptomatic but completely ambulatory  Blood pressure (!) 147/43, pulse 64, temperature 97.6 F (36.4 C), temperature source Oral, resp. rate 16, height 5' 2"  (1.575 m), weight 147 lb 12.8 oz (67 kg), SpO2 100 %.  LABORATORY DATA: Lab Results  Component Value Date   WBC 4.1 10/31/2016   HGB 11.3 (L) 10/31/2016   HCT 35.2 10/31/2016   MCV 90.8 10/31/2016   PLT 77 (L) 10/31/2016      Chemistry      Component Value Date/Time   NA 139 10/31/2016 1453   K 3.9 10/31/2016 1453   CL 102 05/06/2013 1511   CO2 28 10/31/2016 1453   BUN 27.9 (H) 10/31/2016 1453   CREATININE 0.8 10/31/2016 1453      Component Value Date/Time   CALCIUM 9.4 10/31/2016 1453   ALKPHOS 87 10/31/2016 1453   AST 41 (H) 10/31/2016 1453   ALT 37 10/31/2016 1453   BILITOT 0.66 10/31/2016 1453       RADIOGRAPHIC STUDIES: Ct Chest W Contrast  Result Date: 11/01/2016 CLINICAL DATA:  Metastatic lung cancer to bone. EXAM: CT CHEST, ABDOMEN, AND PELVIS WITH CONTRAST TECHNIQUE: Multidetector CT imaging of the chest, abdomen and pelvis was  performed following the standard protocol during bolus administration of intravenous contrast. CONTRAST:  157m ISOVUE-300 IOPAMIDOL (ISOVUE-300) INJECTION 61% COMPARISON:  07/11/2016 FINDINGS: CT CHEST FINDINGS Cardiovascular: Normal heart size. No pericardial effusion identified. Aortic atherosclerosis noted. Mediastinum/Nodes: The trachea appears patent and is midline. Normal appearance of the esophagus. No mediastinal or hilar adenopathy. There is no axillary or supraclavicular adenopathy Lungs/Pleura: Small left pleural effusion is identified and is similar to previous exam. Scattered areas of the scratch scattered areas of nodular septal thickening within both lungs is again identified. The appearance is similar to the previous exam and likely reflects areas of lymphangitic spread of tumor. Soft tissue thickening along the oblique fissure within the left lung is unchanged. A few patchy areas of ground-glass attenuation are again noted. Musculoskeletal: Multifocal areas of mixed lytic and sclerotic bone metastases identified. The appearance is unchanged when compared with the previous examination. No fractures noted. CT ABDOMEN PELVIS FINDINGS Hepatobiliary: There is a heterogeneous attenuation with hepatic steatosis noted. Stable from previous exam. No focal liver abnormality. The gallbladder appears normal. No biliary dilatation. Pancreas: Unremarkable. No pancreatic ductal dilatation or surrounding inflammatory changes. Spleen: Normal in size without focal abnormality. Adrenals/Urinary Tract: Unremarkable appearance of the adrenal glands. The right kidney appears normal. Cyst within the inferior pole of left kidney is again noted. Left-sided pelvocaliectasis is unchanged from  previous exam. Urinary bladder appears normal. Stomach/Bowel: The stomach appears within normal limits. The small bowel loops have a normal course and caliber. No pathologic dilatation of the colon. Vascular/Lymphatic: Aortic  atherosclerosis. No aneurysm. No upper abdominal adenopathy. No pelvic or inguinal adenopathy. Reproductive: Calcified fibroid identified within the uterus. Other: No abdominal wall hernia or abnormality. No abdominopelvic ascites. Musculoskeletal: Multifocal sclerotic bone metastases are identified within the bony pelvis, similar to previous exam. Bilateral L5 pars defects with anterolisthesis of L5 on S1. IMPRESSION: 1. There has been no significant change when compared with study from 07/11/2016 compatible with a stable exam. 2. Again noted are changes of treated lymphangitic spread of tumor within the lungs bilaterally. 3. Small chronic left pleural effusion, unchanged. 4. Similar appearance of multifocal lytic and sclerotic and mixed lytic/sclerotic bone metastases. Electronically Signed   By: Kerby Moors M.D.   On: 11/01/2016 10:48   Ct Abdomen Pelvis W Contrast  Result Date: 11/01/2016 CLINICAL DATA:  Metastatic lung cancer to bone. EXAM: CT CHEST, ABDOMEN, AND PELVIS WITH CONTRAST TECHNIQUE: Multidetector CT imaging of the chest, abdomen and pelvis was performed following the standard protocol during bolus administration of intravenous contrast. CONTRAST:  146m ISOVUE-300 IOPAMIDOL (ISOVUE-300) INJECTION 61% COMPARISON:  07/11/2016 FINDINGS: CT CHEST FINDINGS Cardiovascular: Normal heart size. No pericardial effusion identified. Aortic atherosclerosis noted. Mediastinum/Nodes: The trachea appears patent and is midline. Normal appearance of the esophagus. No mediastinal or hilar adenopathy. There is no axillary or supraclavicular adenopathy Lungs/Pleura: Small left pleural effusion is identified and is similar to previous exam. Scattered areas of the scratch scattered areas of nodular septal thickening within both lungs is again identified. The appearance is similar to the previous exam and likely reflects areas of lymphangitic spread of tumor. Soft tissue thickening along the oblique fissure within  the left lung is unchanged. A few patchy areas of ground-glass attenuation are again noted. Musculoskeletal: Multifocal areas of mixed lytic and sclerotic bone metastases identified. The appearance is unchanged when compared with the previous examination. No fractures noted. CT ABDOMEN PELVIS FINDINGS Hepatobiliary: There is a heterogeneous attenuation with hepatic steatosis noted. Stable from previous exam. No focal liver abnormality. The gallbladder appears normal. No biliary dilatation. Pancreas: Unremarkable. No pancreatic ductal dilatation or surrounding inflammatory changes. Spleen: Normal in size without focal abnormality. Adrenals/Urinary Tract: Unremarkable appearance of the adrenal glands. The right kidney appears normal. Cyst within the inferior pole of left kidney is again noted. Left-sided pelvocaliectasis is unchanged from previous exam. Urinary bladder appears normal. Stomach/Bowel: The stomach appears within normal limits. The small bowel loops have a normal course and caliber. No pathologic dilatation of the colon. Vascular/Lymphatic: Aortic atherosclerosis. No aneurysm. No upper abdominal adenopathy. No pelvic or inguinal adenopathy. Reproductive: Calcified fibroid identified within the uterus. Other: No abdominal wall hernia or abnormality. No abdominopelvic ascites. Musculoskeletal: Multifocal sclerotic bone metastases are identified within the bony pelvis, similar to previous exam. Bilateral L5 pars defects with anterolisthesis of L5 on S1. IMPRESSION: 1. There has been no significant change when compared with study from 07/11/2016 compatible with a stable exam. 2. Again noted are changes of treated lymphangitic spread of tumor within the lungs bilaterally. 3. Small chronic left pleural effusion, unchanged. 4. Similar appearance of multifocal lytic and sclerotic and mixed lytic/sclerotic bone metastases. Electronically Signed   By: TKerby MoorsM.D.   On: 11/01/2016 10:48    ASSESSMENT AND  PLAN: This is a very pleasant 70years old Hispanic female with: 1) stage IV non-small cell  lung cancer, adenocarcinoma with positive EGFR mutation in exon 19 and negative ALK gene translocation diagnosed in July 2013 status post 3 years of treatment with Tarceva discontinued secondary to disease progression and development of T790M resistant mutation. She is currently undergoing treatment with Tagrisso status post 8 months of treatment and tolerating it fairly well. She had a recent CT scan of the chest, abdomen and pelvis. I personally and independently reviewed the scan and discuss the results with the patient and her niece. The scan showed no clear evidence for disease progression. I recommended for the patient to continue her current treatment with Tagrisso 80 mg by mouth daily. I would see her back for follow-up visit in one month's for reevaluation with repeat blood work. 2) anemia of neoplastic disease: Her hemoglobin and hematocrit are better. I recommended for the patient to continue her current treatment with Integra plus. I will continue to monitor her hemoglobin and hematocrit closely on the upcoming bloodwork. 3) thrombocytopenia: Multifactorial but could be related to her treatment with Tagrisso. We will continue to monitor for now.  The patient was advised to call immediately if she has any concerning symptoms in the interval. The patient voices understanding of current disease status and treatment options and is in agreement with the current care plan.  All questions were answered. The patient knows to call the clinic with any problems, questions or concerns. We can certainly see the patient much sooner if necessary.  Disclaimer: This note was dictated with voice recognition software. Similar sounding words can inadvertently be transcribed and may not be corrected upon review.

## 2016-12-15 ENCOUNTER — Telehealth: Payer: Self-pay | Admitting: Internal Medicine

## 2016-12-15 NOTE — Telephone Encounter (Signed)
PAL - moved lab/fu from 1/23 to 1/26 per MM. Left message for patient on both home/cell phone.

## 2016-12-18 MED FILL — TAGRISSO 80 MG TABLET: 80 | 30 days supply | Qty: 30 | Fill #2

## 2016-12-19 ENCOUNTER — Ambulatory Visit: Payer: 59 | Admitting: Internal Medicine

## 2016-12-19 ENCOUNTER — Other Ambulatory Visit: Payer: 59

## 2016-12-22 ENCOUNTER — Ambulatory Visit (HOSPITAL_BASED_OUTPATIENT_CLINIC_OR_DEPARTMENT_OTHER): Payer: Medicare Other | Admitting: Internal Medicine

## 2016-12-22 ENCOUNTER — Other Ambulatory Visit (HOSPITAL_BASED_OUTPATIENT_CLINIC_OR_DEPARTMENT_OTHER): Payer: Medicare Other

## 2016-12-22 ENCOUNTER — Telehealth: Payer: Self-pay | Admitting: Internal Medicine

## 2016-12-22 ENCOUNTER — Encounter: Payer: Self-pay | Admitting: Internal Medicine

## 2016-12-22 VITALS — BP 128/35 | HR 76 | Temp 97.4°F | Resp 18 | Ht 62.0 in | Wt 145.5 lb

## 2016-12-22 DIAGNOSIS — C7952 Secondary malignant neoplasm of bone marrow: Secondary | ICD-10-CM

## 2016-12-22 DIAGNOSIS — C3490 Malignant neoplasm of unspecified part of unspecified bronchus or lung: Secondary | ICD-10-CM | POA: Diagnosis not present

## 2016-12-22 DIAGNOSIS — C799 Secondary malignant neoplasm of unspecified site: Secondary | ICD-10-CM

## 2016-12-22 DIAGNOSIS — D630 Anemia in neoplastic disease: Secondary | ICD-10-CM

## 2016-12-22 DIAGNOSIS — C7951 Secondary malignant neoplasm of bone: Secondary | ICD-10-CM

## 2016-12-22 DIAGNOSIS — Z5111 Encounter for antineoplastic chemotherapy: Secondary | ICD-10-CM

## 2016-12-22 DIAGNOSIS — C3492 Malignant neoplasm of unspecified part of left bronchus or lung: Secondary | ICD-10-CM

## 2016-12-22 LAB — COMPREHENSIVE METABOLIC PANEL
ALT: 25 U/L (ref 0–55)
AST: 33 U/L (ref 5–34)
Albumin: 3.4 g/dL — ABNORMAL LOW (ref 3.5–5.0)
Alkaline Phosphatase: 80 U/L (ref 40–150)
Anion Gap: 8 mEq/L (ref 3–11)
BUN: 26.8 mg/dL — AB (ref 7.0–26.0)
CHLORIDE: 104 meq/L (ref 98–109)
CO2: 28 meq/L (ref 22–29)
CREATININE: 1 mg/dL (ref 0.6–1.1)
Calcium: 9.8 mg/dL (ref 8.4–10.4)
EGFR: 55 mL/min/{1.73_m2} — ABNORMAL LOW (ref >90)
Glucose: 114 mg/dl (ref 70–140)
POTASSIUM: 4.3 meq/L (ref 3.5–5.1)
Sodium: 140 mEq/L (ref 136–145)
Total Bilirubin: 0.84 mg/dL (ref 0.20–1.20)
Total Protein: 7.8 g/dL (ref 6.4–8.3)

## 2016-12-22 LAB — CBC WITH DIFFERENTIAL/PLATELET
BASO%: 0.2 % (ref 0.0–2.0)
Basophils Absolute: 0 10*3/uL (ref 0.0–0.1)
EOS%: 2.4 % (ref 0.0–7.0)
Eosinophils Absolute: 0.1 10*3/uL (ref 0.0–0.5)
HCT: 34.1 % — ABNORMAL LOW (ref 34.8–46.6)
HGB: 11.1 g/dL — ABNORMAL LOW (ref 11.6–15.9)
LYMPH#: 0.7 10*3/uL — AB (ref 0.9–3.3)
LYMPH%: 16.1 % (ref 14.0–49.7)
MCH: 29.1 pg (ref 25.1–34.0)
MCHC: 32.5 g/dL (ref 31.5–36.0)
MCV: 89.6 fL (ref 79.5–101.0)
MONO#: 0.4 10*3/uL (ref 0.1–0.9)
MONO%: 8.6 % (ref 0.0–14.0)
NEUT#: 3 10*3/uL (ref 1.5–6.5)
NEUT%: 72.7 % (ref 38.4–76.8)
Platelets: 83 10*3/uL — ABNORMAL LOW (ref 145–400)
RBC: 3.81 10*6/uL (ref 3.70–5.45)
RDW: 14.9 % — ABNORMAL HIGH (ref 11.2–14.5)
WBC: 4.1 10*3/uL (ref 3.9–10.3)

## 2016-12-22 NOTE — Telephone Encounter (Signed)
Appointments scheduled per 12/22/16 los. Patient was given 2 bottles of contrast,per CT ordered. Patient was also given a copy of the appointment schedule and AVS report, per 12/22/16 los.

## 2016-12-22 NOTE — Progress Notes (Signed)
[place] [phone]) [phone]   [phone]  OFFICE PROGRESS NOTE  CLOWARD,DAVIS L, MD [address] [place] [address]  DIAGNOSIS: Metastatic non-small cell lung cancer, adenocarcinoma with positive EGFR mutation in exon 19 and development of resistant T790M mutation. This was initially diagnosed in July 2016.  PRIOR THERAPY: 1) Status post radiotherapy to the left face rib metastasis under the care of Dr. Lisbeth Renshaw.  2) stereotactic radiotherapy to the enlarging left lower lobe lung nodule under the care of Dr. Lisbeth Renshaw. 3) treatment with Tarceva 150 mg by mouth daily, therapy beginning 07/20/2012. Status post approximately 36 months of therapy. This was discontinued secondary to disease progression  CURRENT THERAPY: 1) prednisone 80 mg by mouth daily started 02/03/2016 status post more than 10 months of treatment. 2) Xgeva 120 mg subcutaneously every 4 weeks for bone metastasis.  INTERVAL HISTORY: Jacqueline Leonard 71 y.o. female came to the clinic today for follow-up visit accompanied by her niece [name]. The patient has no complaints today except for mild fatigue. She is currently on treatment with Tagrisso 80 mg by mouth daily status post more than 9 months. She has been treating this treatment well with no significant adverse effects. She denied having any chest pain, shortness breath, cough or hemoptysis. She has no fever or chills. She denied having any nausea, vomiting, diarrhea or constipation. She is here today for evaluation and repeat blood work.   MEDICAL HISTORY: Past Medical History:  Diagnosis Date  . Allergy   . Anxiety   . Cancer, metastatic to bone ([facility]) 06/19/12   bx=L 5th ribmetastatic Adenocarcinoma with known lung mass  . Encounter for antineoplastic chemotherapy 02/14/2016  . External hemorrhoids   . Hyperlipidemia   . Hypertension   . Lung mass   . Radiation 07/11/12-07/24/12   Palliative lung tx 30 gray in 10 fx  . S/P  radiation therapy 07/22/14-07/31/14   left lung/60gy/56f  . Status post chemoradiation    Tarceva  . Vitamin D deficiency     ALLERGIES:  is allergic to penicillins.  MEDICATIONS:  Current Outpatient Prescriptions  Medication Sig Dispense Refill  . amLODipine (NORVASC) 5 MG tablet Take 5 mg by mouth daily.    . cholecalciferol (VITAMIN D) 1000 UNITS tablet Take 1,000 Units by mouth daily.    . Clobetasol Propionate 0.05 % shampoo Reported on 01/05/2016    . FeFum-FePoly-FA-B Cmp-C-Biot (INTEGRA PLUS) CAPS Take 1 capsule by mouth every morning. 30 capsule 2  . fluocinonide (LIDEX) 0.05 % external solution Reported on 01/05/2016    . HYDROcodone-homatropine (HYCODAN) 5-1.5 MG/5ML syrup Take 5 mLs by mouth every 6 (six) hours as needed for cough. 120 mL 0  . hydrocortisone 2.5 % cream Reported on 05/16/2016    . Multiple Minerals-Vitamins (CALCIUM & VIT D3 BONE HEALTH PO) Take 750 mg by mouth 2 (two) times daily.    .Marland Kitchenosimertinib mesylate (TAGRISSO) 80 MG tablet Take 1 tablet (80 mg total) by mouth daily. 90 tablet 0  . potassium chloride (K-DUR) 10 MEQ tablet Take 1 tablet by mouth daily.    . valsartan-hydrochlorothiazide (DIOVAN-HCT) 160-25 MG per tablet Take 1 tablet by mouth daily.     No current facility-administered medications for this visit.     SURGICAL HISTORY:  Past Surgical History:  Procedure Laterality Date  . APPENDECTOMY    . OVARIAN CYST REMOVAL    . SOFT TISSUE BIOPSY  06/19/12   L 5th rib=metastatic adenocarcinoma  REVIEW OF SYSTEMS:  A comprehensive review of systems was negative except for: Constitutional: positive for fatigue   PHYSICAL EXAMINATION: General appearance: alert, cooperative and fatigued Head: Normocephalic, without obvious abnormality, atraumatic Neck: no adenopathy, no JVD, supple, symmetrical, trachea midline and thyroid not enlarged, symmetric, no tenderness/mass/nodules Lymph nodes: Cervical, supraclavicular, and axillary nodes normal. Resp:  clear to auscultation bilaterally Back: symmetric, no curvature. ROM normal. No CVA tenderness. Cardio: regular rate and rhythm, S1, S2 normal, no murmur, click, rub or gallop GI: soft, non-tender; bowel sounds normal; no masses,  no organomegaly Extremities: extremities normal, atraumatic, no cyanosis or edema  ECOG PERFORMANCE STATUS: 1 - Symptomatic but completely ambulatory  Blood pressure (!) 128/35, pulse 76, temperature 97.4 F (36.3 C), temperature source Oral, resp. rate 18, height 5' 2"  (1.575 m), weight 145 lb 8 oz (66 kg), SpO2 99 %.  LABORATORY DATA: Lab Results  Component Value Date   WBC 4.1 12/22/2016   HGB 11.1 (L) 12/22/2016   HCT 34.1 (L) 12/22/2016   MCV 89.6 12/22/2016   PLT 83 (L) 12/22/2016      Chemistry      Component Value Date/Time   NA 140 12/22/2016 1020   K 4.3 12/22/2016 1020   CL 102 05/06/2013 1511   CO2 28 12/22/2016 1020   BUN 26.8 (H) 12/22/2016 1020   CREATININE 1.0 12/22/2016 1020      Component Value Date/Time   CALCIUM 9.4 10/31/2016 1453   ALKPHOS 87 10/31/2016 1453   AST 41 (H) 10/31/2016 1453   ALT 37 10/31/2016 1453   BILITOT 0.66 10/31/2016 1453       RADIOGRAPHIC STUDIES: No results found.  ASSESSMENT AND PLAN:  This is a very pleasant 71 years old Hispanic female with stage IV non-small cell lung cancer, adenocarcinoma with positive EGFR mutation in exon 19 diagnosed in July 2016 status post 3 years treatment with Tarceva discontinued secondary to disease progression and development of T790M resistant mutation. The patient was started on treatment with Tagrisso 80 mg by mouth daily status post more than 9 months of treatment. She is tolerating Tagrisso very well. Her lab work today is unremarkable except for the mild anemia. I recommended for the patient to continue her current treatment with Tagrisso with the same dose. I would see her back for follow-up visit in one month's for evaluation with repeat CT scan of the  chest, abdomen and pelvis for restaging of her disease. She was advised to call immediately if she has any concerning symptoms in the interval. The patient voices understanding of current disease status and treatment options and is in agreement with the current care plan.  All questions were answered. The patient knows to call the clinic with any problems, questions or concerns. We can certainly see the patient much sooner if necessary. I spent 10 minutes counseling the patient face to face. The total time spent in the appointment was 15 minutes. Disclaimer: This note was dictated with voice recognition software. Similar sounding words can inadvertently be transcribed and may not be corrected upon review.

## 2016-12-25 DIAGNOSIS — R05 Cough: Secondary | ICD-10-CM | POA: Diagnosis not present

## 2016-12-25 DIAGNOSIS — C3490 Malignant neoplasm of unspecified part of unspecified bronchus or lung: Secondary | ICD-10-CM | POA: Diagnosis not present

## 2016-12-25 DIAGNOSIS — I10 Essential (primary) hypertension: Secondary | ICD-10-CM | POA: Diagnosis not present

## 2016-12-26 ENCOUNTER — Encounter (HOSPITAL_COMMUNITY): Payer: Self-pay | Admitting: Emergency Medicine

## 2016-12-26 ENCOUNTER — Observation Stay (HOSPITAL_COMMUNITY)
Admission: EM | Admit: 2016-12-26 | Discharge: 2016-12-26 | Disposition: A | Discharge status: Home / Self Care | Payer: Medicare Other | Attending: Emergency Medicine | Admitting: Emergency Medicine

## 2016-12-26 DIAGNOSIS — E559 Vitamin D deficiency, unspecified: Secondary | ICD-10-CM | POA: Diagnosis not present

## 2016-12-26 DIAGNOSIS — R69 Illness, unspecified: Secondary | ICD-10-CM

## 2016-12-26 DIAGNOSIS — N3000 Acute cystitis without hematuria: Secondary | ICD-10-CM | POA: Diagnosis not present

## 2016-12-26 DIAGNOSIS — Z79899 Other long term (current) drug therapy: Secondary | ICD-10-CM | POA: Diagnosis not present

## 2016-12-26 DIAGNOSIS — C3492 Malignant neoplasm of unspecified part of left bronchus or lung: Secondary | ICD-10-CM | POA: Diagnosis present

## 2016-12-26 DIAGNOSIS — Z880 Allergy status to penicillin: Secondary | ICD-10-CM | POA: Diagnosis not present

## 2016-12-26 DIAGNOSIS — R531 Weakness: Secondary | ICD-10-CM | POA: Diagnosis not present

## 2016-12-26 DIAGNOSIS — C7951 Secondary malignant neoplasm of bone: Secondary | ICD-10-CM | POA: Diagnosis not present

## 2016-12-26 DIAGNOSIS — Z923 Personal history of irradiation: Secondary | ICD-10-CM | POA: Insufficient documentation

## 2016-12-26 DIAGNOSIS — E669 Obesity, unspecified: Secondary | ICD-10-CM | POA: Diagnosis not present

## 2016-12-26 DIAGNOSIS — N390 Urinary tract infection, site not specified: Secondary | ICD-10-CM | POA: Diagnosis present

## 2016-12-26 DIAGNOSIS — E871 Hypo-osmolality and hyponatremia: Secondary | ICD-10-CM | POA: Diagnosis not present

## 2016-12-26 DIAGNOSIS — I10 Essential (primary) hypertension: Secondary | ICD-10-CM | POA: Diagnosis not present

## 2016-12-26 DIAGNOSIS — R509 Fever, unspecified: Secondary | ICD-10-CM

## 2016-12-26 DIAGNOSIS — R112 Nausea with vomiting, unspecified: Secondary | ICD-10-CM | POA: Diagnosis present

## 2016-12-26 LAB — CBC WITH DIFFERENTIAL/PLATELET
BASOS ABS: 0 10*3/uL (ref 0.0–0.1)
BASOS PCT: 0 %
EOS ABS: 0 10*3/uL (ref 0.0–0.7)
Eosinophils Relative: 0 %
HEMATOCRIT: 31.7 % — AB (ref 36.0–46.0)
HEMOGLOBIN: 10.4 g/dL — AB (ref 12.0–15.0)
Lymphocytes Relative: 13 %
Lymphs Abs: 0.8 10*3/uL (ref 0.7–4.0)
MCH: 28.5 pg (ref 26.0–34.0)
MCHC: 32.8 g/dL (ref 30.0–36.0)
MCV: 86.8 fL (ref 78.0–100.0)
MONOS PCT: 10 %
Monocytes Absolute: 0.6 10*3/uL (ref 0.1–1.0)
NEUTROS ABS: 4.3 10*3/uL (ref 1.7–7.7)
NEUTROS PCT: 77 %
Platelets: 65 10*3/uL — ABNORMAL LOW (ref 150–400)
RBC: 3.65 MIL/uL — ABNORMAL LOW (ref 3.87–5.11)
RDW: 14.2 % (ref 11.5–15.5)
WBC: 5.6 10*3/uL (ref 4.0–10.5)

## 2016-12-26 LAB — COMPREHENSIVE METABOLIC PANEL
ALBUMIN: 3.4 g/dL — AB (ref 3.5–5.0)
ALT: 53 U/L (ref 14–54)
ANION GAP: 6 (ref 5–15)
AST: 95 U/L — ABNORMAL HIGH (ref 15–41)
Alkaline Phosphatase: 86 U/L (ref 38–126)
BILIRUBIN TOTAL: 0.8 mg/dL (ref 0.3–1.2)
BUN: 27 mg/dL — ABNORMAL HIGH (ref 6–20)
CALCIUM: 8.8 mg/dL — AB (ref 8.9–10.3)
CO2: 25 mmol/L (ref 22–32)
Chloride: 99 mmol/L — ABNORMAL LOW (ref 101–111)
Creatinine, Ser: 1.1 mg/dL — ABNORMAL HIGH (ref 0.44–1.00)
GFR, EST AFRICAN AMERICAN: 57 mL/min — AB (ref >60)
GFR, EST NON AFRICAN AMERICAN: 49 mL/min — AB (ref >60)
Glucose, Bld: 113 mg/dL — ABNORMAL HIGH (ref 65–99)
POTASSIUM: 3.9 mmol/L (ref 3.5–5.1)
Sodium: 130 mmol/L — ABNORMAL LOW (ref 135–145)
TOTAL PROTEIN: 7.7 g/dL (ref 6.5–8.1)

## 2016-12-26 LAB — URINALYSIS, ROUTINE W REFLEX MICROSCOPIC
Bilirubin Urine: NEGATIVE
GLUCOSE, UA: NEGATIVE mg/dL
KETONES UR: NEGATIVE mg/dL
NITRITE: NEGATIVE
PH: 5 (ref 5.0–8.0)
Protein, ur: 30 mg/dL — AB
Specific Gravity, Urine: 1.021 (ref 1.005–1.030)

## 2016-12-26 LAB — LIPASE, BLOOD: LIPASE: 33 U/L (ref 11–51)

## 2016-12-26 LAB — INFLUENZA PANEL BY PCR (TYPE A & B)
Influenza A By PCR: NEGATIVE
Influenza B By PCR: NEGATIVE

## 2016-12-26 LAB — I-STAT CG4 LACTIC ACID, ED: Lactic Acid, Venous: 1.3 mmol/L (ref 0.5–1.9)

## 2016-12-26 MED ORDER — ONDANSETRON 4 MG PO TBDP: 4.0000 mg | ORAL_TABLET | ORAL | 0 refills | Status: DC | PRN

## 2016-12-26 MED ORDER — SODIUM CHLORIDE 0.9 % IV SOLN
1000.0000 mL | INTRAVENOUS | Status: DC
  Administered 2016-12-26: 1000 mL via INTRAVENOUS

## 2016-12-26 MED ORDER — SODIUM CHLORIDE 0.9 % IV SOLN
1000.0000 mL | Freq: Once | INTRAVENOUS | Status: AC
  Administered 2016-12-26: 1000 mL via INTRAVENOUS

## 2016-12-26 MED ORDER — LEVOFLOXACIN IN D5W 750 MG/150ML IV SOLN
750.0000 mg | Freq: Once | INTRAVENOUS | Status: DC
  Filled 2016-12-26 (×2): qty 150

## 2016-12-26 MED ORDER — ACETAMINOPHEN 325 MG PO TABS
650.0000 mg | ORAL_TABLET | Freq: Once | ORAL | Status: AC
  Administered 2016-12-26: 650 mg via ORAL
  Filled 2016-12-26: qty 2

## 2016-12-26 MED ORDER — CEPHALEXIN 500 MG PO CAPS: 1000.0000 mg | ORAL_CAPSULE | Freq: Two times a day (BID) | ORAL | 0 refills | Status: DC

## 2016-12-26 MED ORDER — ACETAMINOPHEN 500 MG PO TABS: 1000.0000 mg | ORAL_TABLET | Freq: Four times a day (QID) | ORAL | 0 refills | Status: DC | PRN

## 2016-12-26 NOTE — Progress Notes (Signed)
Pt states her pcp is on [address] in [place] "melina"

## 2016-12-26 NOTE — Progress Notes (Signed)
Entered in d/c instructions Folow up with your pcp is on [address] in [place] "melina"

## 2016-12-26 NOTE — ED Notes (Signed)
Awaiting for  pharmacy to send levaquin, the one in pyxis is '500mg'$  only.

## 2016-12-26 NOTE — ED Notes (Signed)
Pt reports feeling nauseous early this morning, sts she had one episode of vomiting and one episode of diarrhea. She also sts she felt weak. Pt sts she feels a lot better now. Denies pain. Pt sts takes a chemo pill every day, which does not make her nauseous. Pt sts she was at cancer center on Friday which was crowded and thinks she may have been exposed to stomach virus.

## 2016-12-26 NOTE — Consult Note (Signed)
ED Consult Note  Jacqueline Leonard HAL:[id number] DOB: [birth year] DOA: 12/26/2016  Referring MD/NP/PA: Dr. Johnney Killian, [place] PCP: PROVIDER NOT IN SYSTEM Outpatient Specialists: Dr. Julien Nordmann, Oncology  Patient coming from: Home  Chief Complaint: Vomiting, weakness  HPI: Jacqueline Leonard is a 71 y.o. female with a history of stage IV NSCLC followed by Dr. Julien Nordmann who presented to the [facility] with 1 day of vomiting. She developed intermittent non-bloody, non-bilious emesis 2 times with minimal associated nausea and a single episode of non-bloody loose stool this morning. No therapies tried at home. She denies any fevers, and has been able to keep water down overnight, but felt weak this morning, so thought she should be evaluated. She denies abdominal pain, vaginal bleeding or discharge, dysuria, urinary urgency and frequency. When she arrived to the ED she appeared in no distress, temp 98.2, but rose to 101.45F, and HR 85, RR 17, BP131/47. 1L bolus and a dose of IV levaquin were provided (PCN allergy), TRH asked to consult for UTI in immunosuppressed patient.  Review of Systems: As above per HPI. All others reviewed and are negative.       Past Medical History:  Diagnosis Date  . Allergy   . Anxiety   . Cancer, metastatic to bone ([facility]) 06/19/12   bx=L 5th ribmetastatic Adenocarcinoma with known lung mass  . Encounter for antineoplastic chemotherapy 02/14/2016  . External hemorrhoids   . Hyperlipidemia   . Hypertension   . Lung mass   . Radiation 07/11/12-07/24/12   Palliative lung tx 30 gray in 10 fx  . S/P radiation therapy 07/22/14-07/31/14   left lung/60gy/56f  . Status post chemoradiation    Tarceva  . Vitamin D deficiency         Past Surgical History:  Procedure Laterality Date  . APPENDECTOMY    . OVARIAN CYST REMOVAL    . SOFT TISSUE BIOPSY  06/19/12   L 5th rib=metastatic adenocarcinoma   - Nonsmoker, no EtOH or illicit drugs, lives with family in [place]        Allergies  Allergen Reactions  . Penicillins Palpitations and Other (See Comments)    Has patient had a PCN reaction causing immediate rash, facial/tongue/throat swelling, SOB or lightheadedness with hypotension: No Has patient had a PCN reaction causing severe rash involving mucus membranes or skin necrosis: No Has patient had a PCN reaction that required hospitalization No Has patient had a PCN reaction occurring within the last 10 years: Yes If all of the above answers are "NO", then may proceed with Cephalosporin use.        Family History  Problem Relation Age of Onset  . Breast cancer Sister   . Lung cancer Brother    - Family history otherwise reviewed and not pertinent.          Prior to Admission medications   Medication Sig Start Date End Date Taking? Authorizing Provider  amLODipine (NORVASC) 5 MG tablet Take 2.5 mg by mouth daily.    Yes Historical Provider, MD  cholecalciferol (VITAMIN D) 1000 UNITS tablet Take 1,000 Units by mouth daily.   Yes Historical Provider, MD  FeFum-FePoly-FA-B Cmp-C-Biot (INTEGRA PLUS) CAPS Take 1 capsule by mouth every morning. 10/03/16  Yes MCurt Bears MD  osimertinib mesylate (TAGRISSO) 80 MG tablet Take 1 tablet (80 mg total) by mouth daily. 10/09/16  Yes MCurt Bears MD  potassium chloride (K-DUR) 10 MEQ tablet Take 1 tablet by mouth daily. 03/13/14  Yes Historical Provider, MD  HYDROcodone-homatropine (HYCODAN) 5-1.5 MG/5ML  syrup Take 5 mLs by mouth every 6 (six) hours as needed for cough. Patient not taking: Reported on 12/26/2016 01/05/16   Curt Bears, MD    Physical Exam:       Vitals:   12/26/16 0819 12/26/16 1025 12/26/16 1159 12/26/16 1313  BP:    (!) 135/52  Pulse:    82  Resp:    16  Temp:  101.9 F (38.8 C) 98.9 F (37.2 C) 98 F (36.7 C)  TempSrc:   Oral Oral  SpO2:  96%  92%  Weight: 65.8 kg (145 lb)     Height: '5\' 2"'$  (1.575 m)      Constitutional: 71 y.o. female  in no distress, calm demeanor Eyes: Lids and conjunctivae normal, PERRL ENMT: Mucous membranes are moist. Posterior pharynx clear of any exudate or lesions. Poor dentition.  Neck: normal, supple, no masses, no thyromegaly Respiratory: Non-labored breathing without accessory muscle use. Clear breath sounds to auscultation bilaterally Cardiovascular: Regular rate and rhythm, no murmurs, rubs, or gallops. No carotid bruits. No JVD. No LE edema. 2+ pedal pulses. Abdomen: Normoactive bowel sounds. No tenderness, non-distended, and no masses palpated. No hepatosplenomegaly. GU: No indwelling catheter Musculoskeletal: No clubbing / cyanosis. No joint deformity upper and lower extremities. Good ROM, no contractures. Normal muscle tone.  Skin: Warm, dry. No rashes, wounds, no ulcers. No significant lesions noted.  Neurologic: CN II-XII grossly intact. Gait narrow-based. Speech normal. No focal deficits in motor strength or sensation in all extremities.  Psychiatric: Alert and oriented x3. Normal judgment and insight. Mood euthymic with congruent affect.   Labs on Admission: I have personally reviewed following labs and imaging studies  CBC:  Last Labs    Recent Labs Lab 12/22/16 1020 12/26/16 1032  WBC 4.1 5.6  NEUTROABS 3.0 4.3  HGB 11.1* 10.4*  HCT 34.1* 31.7*  MCV 89.6 86.8  PLT 83* 65*     Basic Metabolic Panel:  Last Labs    Recent Labs Lab 12/22/16 1020 12/26/16 1032  NA 140 130*  K 4.3 3.9  CL  --  99*  CO2 28 25  GLUCOSE 114 113*  BUN 26.8* 27*  CREATININE 1.0 1.10*  CALCIUM 9.8 8.8*     GFR: Estimated Creatinine Clearance: 41.2 mL/min (by C-G formula based on SCr of 1.1 mg/dL (H)). Liver Function Tests:  Last Labs    Recent Labs Lab 12/22/16 1020 12/26/16 1032  AST 33 95*  ALT 25 53  ALKPHOS 80 86  BILITOT 0.84 0.8  PROT 7.8 7.7  ALBUMIN 3.4* 3.4*      Last Labs    Recent Labs Lab 12/26/16 1032  LIPASE 33     Urine analysis: Labs  (Brief)          Component Value Date/Time   COLORURINE YELLOW 12/26/2016 1135   APPEARANCEUR HAZY (A) 12/26/2016 1135   LABSPEC 1.021 12/26/2016 1135   LABSPEC 1.010 10/10/2013 1442   PHURINE 5.0 12/26/2016 1135   GLUCOSEU NEGATIVE 12/26/2016 1135   GLUCOSEU Negative 10/10/2013 1442   HGBUR LARGE (A) 12/26/2016 1135   BILIRUBINUR NEGATIVE 12/26/2016 1135   BILIRUBINUR Negative 10/10/2013 1442   [place] 12/26/2016 1135   PROTEINUR 30 (A) 12/26/2016 1135   UROBILINOGEN 0.2 10/10/2013 1442   NITRITE NEGATIVE 12/26/2016 1135   LEUKOCYTESUR MODERATE (A) 12/26/2016 1135   LEUKOCYTESUR Negative 10/10/2013 1442     Assessment/Plan Principal Problem:   UTI (urinary tract infection) Active Problems:   Adenocarcinoma of left lung, stage  4 ([facility])   UTI: Without sepsis. Lactate reassuringly normal. Flu negative. No leukocytosis. Patient is able to tolerate per oral intake and would likely do well as an outpatient. There was concern for her risk of complications undergoing oral therapy with tagrisso per Dr. Julien Nordmann. I've discussed this with Dr. Julien Nordmann who states that she is not actually immunosuppressed with this drug, though this is the cause of her thrombocytopenia. He would recommend outpatient therapy rather than exposing her to the risks of hospitalization and I agree that this is appropriate.  - Discussed with Dr. Johnney Killian who agrees to the plan. Further discussed this with the patient and her niece at the bedside who are comfortable with outpatient antibiotics and oral hydration.  - Continue oral antibiotics, noted PCN allergy.  - Monitor blood cultures and urine culture, no prior positive cultures to guide therapy.  - Will follow up with Dr. Julien Nordmann later this week   Vance Gather, MD [facility] Pager [phone]

## 2016-12-26 NOTE — ED Triage Notes (Signed)
Patient is complaining of N/V/D x 1 day.  She states she is weak and unable to keep anything down.  She gives very little information.

## 2016-12-26 NOTE — H&P (Deleted)
ED Consult Note  Jacqueline Leonard GNF:[id number] DOB: 12-22-1945 DOA: 12/26/2016  Referring MD/NP/PA: Dr. Johnney Killian, [place] PCP: PROVIDER NOT IN SYSTEM Outpatient Specialists: Dr. Julien Nordmann, Oncology  Patient coming from: Home  Chief Complaint: Vomiting, weakness  HPI: Jacqueline Leonard is a 71 y.o. female with a history of stage IV NSCLC followed by Dr. Julien Nordmann who presented to the [facility] with 1 day of vomiting. She developed intermittent non-bloody, non-bilious emesis 2 times with minimal associated nausea and a single episode of non-bloody loose stool this morning. No therapies tried at home. She denies any fevers, and has been able to keep water down overnight, but felt weak this morning, so thought she should be evaluated. She denies abdominal pain, vaginal bleeding or discharge, dysuria, urinary urgency and frequency. When she arrived to the ED she appeared in no distress, temp 98.2, but rose to 101.93F, and HR 85, RR 17, BP131/47. 1L bolus and a dose of IV levaquin were provided (PCN allergy), TRH asked to consult for UTI in immunosuppressed patient.  Review of Systems: As above per HPI. All others reviewed and are negative.   Past Medical History:  Diagnosis Date  . Allergy   . Anxiety   . Cancer, metastatic to bone ([facility]) 06/19/12   bx=L 5th ribmetastatic Adenocarcinoma with known lung mass  . Encounter for antineoplastic chemotherapy 02/14/2016  . External hemorrhoids   . Hyperlipidemia   . Hypertension   . Lung mass   . Radiation 07/11/12-07/24/12   Palliative lung tx 30 gray in 10 fx  . S/P radiation therapy 07/22/14-07/31/14   left lung/60gy/20f  . Status post chemoradiation    Tarceva  . Vitamin D deficiency    Past Surgical History:  Procedure Laterality Date  . APPENDECTOMY    . OVARIAN CYST REMOVAL    . SOFT TISSUE BIOPSY  06/19/12   L 5th rib=metastatic adenocarcinoma   - Nonsmoker, no EtOH or illicit drugs, lives [place] family in [place]   Allergies  Allergen Reactions  .  Penicillins Palpitations and Other (See Comments)    Has patient had a PCN reaction causing immediate rash, facial/tongue/throat swelling, SOB or lightheadedness with hypotension: No Has patient had a PCN reaction causing severe rash involving mucus membranes or skin necrosis: No Has patient had a PCN reaction that required hospitalization No Has patient had a PCN reaction occurring within the last 10 years: Yes If all of the above answers are "NO", then may proceed with Cephalosporin use.   Family History  Problem Relation Age of Onset  . Breast cancer Sister   . Lung cancer Brother    - Family history otherwise reviewed and not pertinent.   Prior to Admission medications   Medication Sig Start Date End Date Taking? Authorizing Provider  amLODipine (NORVASC) 5 MG tablet Take 2.5 mg by mouth daily.    Yes Historical Provider, MD  cholecalciferol (VITAMIN D) 1000 UNITS tablet Take 1,000 Units by mouth daily.   Yes Historical Provider, MD  FeFum-FePoly-FA-B Cmp-C-Biot (INTEGRA PLUS) CAPS Take 1 capsule by mouth every morning. 10/03/16  Yes MCurt Bears MD  osimertinib mesylate (TAGRISSO) 80 MG tablet Take 1 tablet (80 mg total) by mouth daily. 10/09/16  Yes MCurt Bears MD  potassium chloride (K-DUR) 10 MEQ tablet Take 1 tablet by mouth daily. 03/13/14  Yes Historical Provider, MD  HYDROcodone-homatropine (HYCODAN) 5-1.5 MG/5ML syrup Take 5 mLs by mouth every 6 (six) hours as needed for cough. Patient not taking: Reported on 12/26/2016 01/05/16   MCurt Bears  MD    Physical Exam: Vitals:   12/26/16 0819 12/26/16 1025 12/26/16 1159 12/26/16 1313  BP:    (!) 135/52  Pulse:    82  Resp:    16  Temp:  101.9 F (38.8 C) 98.9 F (37.2 C) 98 F (36.7 C)  TempSrc:   Oral Oral  SpO2:  96%  92%  Weight: 65.8 kg (145 lb)     Height: '5\' 2"'$  (1.575 m)      Constitutional: 71 y.o. female in no distress, calm demeanor Eyes: Lids and conjunctivae normal, PERRL ENMT: Mucous membranes are  moist. Posterior pharynx clear of any exudate or lesions. Poor dentition.  Neck: normal, supple, no masses, no thyromegaly Respiratory: Non-labored breathing without accessory muscle use. Clear breath sounds to auscultation bilaterally Cardiovascular: Regular rate and rhythm, no murmurs, rubs, or gallops. No carotid bruits. No JVD. No LE edema. 2+ pedal pulses. Abdomen: Normoactive bowel sounds. No tenderness, non-distended, and no masses palpated. No hepatosplenomegaly. GU: No indwelling catheter Musculoskeletal: No clubbing / cyanosis. No joint deformity upper and lower extremities. Good ROM, no contractures. Normal muscle tone.  Skin: Warm, dry. No rashes, wounds, no ulcers. No significant lesions noted.  Neurologic: CN II-XII grossly intact. Gait narrow-based. Speech normal. No focal deficits in motor strength or sensation in all extremities.  Psychiatric: Alert and oriented x3. Normal judgment and insight. Mood euthymic with congruent affect.   Labs on Admission: I have personally reviewed following labs and imaging studies  CBC:  Recent Labs Lab 12/22/16 1020 12/26/16 1032  WBC 4.1 5.6  NEUTROABS 3.0 4.3  HGB 11.1* 10.4*  HCT 34.1* 31.7*  MCV 89.6 86.8  PLT 83* 65*   Basic Metabolic Panel:  Recent Labs Lab 12/22/16 1020 12/26/16 1032  NA 140 130*  K 4.3 3.9  CL  --  99*  CO2 28 25  GLUCOSE 114 113*  BUN 26.8* 27*  CREATININE 1.0 1.10*  CALCIUM 9.8 8.8*   GFR: Estimated Creatinine Clearance: 41.2 mL/min (by C-G formula based on SCr of 1.1 mg/dL (H)). Liver Function Tests:  Recent Labs Lab 12/22/16 1020 12/26/16 1032  AST 33 95*  ALT 25 53  ALKPHOS 80 86  BILITOT 0.84 0.8  PROT 7.8 7.7  ALBUMIN 3.4* 3.4*    Recent Labs Lab 12/26/16 1032  LIPASE 33   Urine analysis:    Component Value Date/Time   COLORURINE YELLOW 12/26/2016 1135   APPEARANCEUR HAZY (A) 12/26/2016 1135   LABSPEC 1.021 12/26/2016 1135   LABSPEC 1.010 10/10/2013 1442   PHURINE 5.0  12/26/2016 1135   GLUCOSEU NEGATIVE 12/26/2016 1135   GLUCOSEU Negative 10/10/2013 1442   HGBUR LARGE (A) 12/26/2016 1135   BILIRUBINUR NEGATIVE 12/26/2016 1135   BILIRUBINUR Negative 10/10/2013 1442   [place] 12/26/2016 1135   PROTEINUR 30 (A) 12/26/2016 1135   UROBILINOGEN 0.2 10/10/2013 1442   NITRITE NEGATIVE 12/26/2016 1135   LEUKOCYTESUR MODERATE (A) 12/26/2016 1135   LEUKOCYTESUR Negative 10/10/2013 1442   Assessment/Plan Principal Problem:   UTI (urinary tract infection) Active Problems:   Adenocarcinoma of left lung, stage 4 ([facility])   UTI: Without sepsis. Lactate reassuringly normal. Flu negative. No leukocytosis. Patient is able to tolerate per oral intake and would likely do well as an outpatient. There was concern for her risk of complications undergoing oral therapy with tagrisso per Dr. Julien Nordmann. I've discussed this with Dr. Julien Nordmann who states that she is not actually immunosuppressed with this drug, though this is the cause of  her thrombocytopenia. He would recommend outpatient therapy rather than exposing her to the risks of hospitalization and I agree that this is appropriate.  - Discussed with Dr. Johnney Killian who agrees to the plan. Further discussed this with the patient and her niece at the bedside who are comfortable with outpatient antibiotics and oral hydration.  - Continue oral antibiotics, noted PCN allergy.  - Monitor blood cultures and urine culture, no prior positive cultures to guide therapy.  - Will follow up with Dr. Julien Nordmann later this week   Vance Gather, MD [facility] Pager [phone]

## 2016-12-26 NOTE — ED Provider Notes (Signed)
[facility] DEPT Provider Note   CSN: [id number] Arrival date & time: 12/26/16  0753     History   Chief Complaint Chief Complaint  Patient presents with  . Nausea  . Weakness    HPI Jacqueline Leonard is a 71 y.o. female.  HPI Patient states that she developed vomiting and diarrhea yesterday. She reports she vomited 2 times last night. She reports she had one episode of diarrhea earlier this morning. She reports this morning she felt very weak generally. She states it was hard for her to even get up out of bed. Reports she has had some chills. She has not had fever that she's measured. No associated pain. Patient reports at this time she now feels better. He has been drinking water and making urine. sHe denies pain burning or urgency with Urination. Patient's medical history is significant for metastatic non-small cell lung cancer. At this time, she is denying pain. Past Medical History:  Diagnosis Date  . Allergy   . Anxiety   . Cancer, metastatic to bone ([place]) 06/19/12   bx=L 5th ribmetastatic Adenocarcinoma with known lung mass  . Encounter for antineoplastic chemotherapy 02/14/2016  . External hemorrhoids   . Hyperlipidemia   . Hypertension   . Lung mass   . Radiation 07/11/12-07/24/12   Palliative lung tx 30 gray in 10 fx  . S/P radiation therapy 07/22/14-07/31/14   left lung/60gy/72f  . Status post chemoradiation    [place]  . Vitamin D deficiency     Patient Active Problem List   Diagnosis Date Noted  . UTI (urinary tract infection) 12/26/2016  . Epistaxis 10/04/2016  . Adenocarcinoma of left lung, stage 4 ([facility] 06/13/2016  . Bone metastases ([facility] 04/11/2016  . Encounter for antineoplastic chemotherapy 02/14/2016  . Anemia in neoplastic disease 06/29/2015  . Pulmonary metastasis ([facility] 07/16/2014  . Vitamin B12 deficiency anemia 02/12/2014  . Secondary malignant neoplasm of bone and bone marrow ([facility] 06/29/2012  . Metastatic adenocarcinoma, pathology 06/19/12  06/27/2012  . Hypertension   . Hyperlipidemia   . Vitamin D deficiency     Past Surgical History:  Procedure Laterality Date  . APPENDECTOMY    . OVARIAN CYST REMOVAL    . SOFT TISSUE BIOPSY  06/19/12   L 5th rib=metastatic adenocarcinoma    OB History    No data available       Home Medications    Prior to Admission medications   Medication Sig Start Date End Date Taking? Authorizing Provider  amLODipine (NORVASC) 5 MG tablet Take 2.5 mg by mouth daily.    Yes Historical Provider, MD  cholecalciferol (VITAMIN D) 1000 UNITS tablet Take 1,000 Units by mouth daily.   Yes Historical Provider, MD  FeFum-FePoly-FA-B Cmp-C-Biot (INTEGRA PLUS) CAPS Take 1 capsule by mouth every morning. 10/03/16  Yes MCurt Bears MD  osimertinib mesylate (TAGRISSO) 80 MG tablet Take 1 tablet (80 mg total) by mouth daily. 10/09/16  Yes MCurt Bears MD  potassium chloride (K-DUR) 10 MEQ tablet Take 1 tablet by mouth daily. 03/13/14  Yes Historical Provider, MD  HYDROcodone-homatropine (HYCODAN) 5-1.5 MG/5ML syrup Take 5 mLs by mouth every 6 (six) hours as needed for cough. Patient not taking: Reported on 12/26/2016 01/05/16   MCurt Bears MD    Family History Family History  Problem Relation Age of Onset  . Breast cancer Sister   . Lung cancer Brother     Social History Social History  Substance Use Topics  . Smoking status: Never Smoker  .  Smokeless tobacco: Never Used  . Alcohol use No     Allergies   Penicillins   Review of Systems Review of Systems 10 Systems reviewed and are negative for acute change except as noted in the HPI.   Physical Exam Updated Vital Signs BP (!) 135/52   Pulse 82   Temp 98 F (36.7 C) (Oral)   Resp 16   Ht '5\' 2"'$  (1.575 m)   Wt 145 lb (65.8 kg)   SpO2 92%   BMI 26.52 kg/m   Physical Exam  Constitutional: She is oriented to person, place, and time.  Patient is alert and nontoxic. She does not have respiratory distress. Patient is mildly  deconditioned in appearance.  HENT:  Head: Normocephalic and atraumatic.  Mouth/Throat: Oropharynx is clear and moist.  Eyes: Conjunctivae and EOM are normal.  Cardiovascular: Normal rate, regular rhythm and intact distal pulses.   Slightly tachycardic at just over 100.  Pulmonary/Chest: Effort normal.  Occasional cough. Respiratory distress at rest. Sounds are soft particularly at the left base. No gross wheeze rhonchi or rail  Abdominal: Soft. She exhibits no distension. There is no tenderness. There is no guarding.  Musculoskeletal: Normal range of motion. She exhibits no edema, tenderness or deformity.  Neurological: She is alert and oriented to person, place, and time. She exhibits normal muscle tone. Coordination normal.  Skin: Skin is warm and dry.  Psychiatric: She has a normal mood and affect.     ED Treatments / Results  Labs (all labs ordered are listed, but only abnormal results are displayed) Labs Reviewed  COMPREHENSIVE METABOLIC PANEL - Abnormal; Notable for the following:       Result Value   Sodium 130 (*)    Chloride 99 (*)    Glucose, Bld 113 (*)    BUN 27 (*)    Creatinine, Ser 1.10 (*)    Calcium 8.8 (*)    Albumin 3.4 (*)    AST 95 (*)    GFR calc non Af Amer 49 (*)    GFR calc Af Amer 57 (*)    All other components within normal limits  CBC WITH DIFFERENTIAL/PLATELET - Abnormal; Notable for the following:    RBC 3.65 (*)    Hemoglobin 10.4 (*)    HCT 31.7 (*)    Platelets 65 (*)    All other components within normal limits  URINALYSIS, ROUTINE W REFLEX MICROSCOPIC - Abnormal; Notable for the following:    APPearance HAZY (*)    Hgb urine dipstick LARGE (*)    Protein, ur 30 (*)    Leukocytes, UA MODERATE (*)    Bacteria, UA RARE (*)    Squamous Epithelial / LPF 0-5 (*)    All other components within normal limits  URINE CULTURE  CULTURE, BLOOD (ROUTINE X 2)  CULTURE, BLOOD (ROUTINE X 2)  LIPASE, BLOOD  INFLUENZA PANEL BY PCR (TYPE A & B)    I-STAT CG4 LACTIC ACID, ED  I-STAT CG4 LACTIC ACID, ED    EKG  EKG Interpretation None       Radiology No results found.  Procedures Procedures (including critical care time)  Medications Ordered in ED Medications  0.9 %  sodium chloride infusion (0 mLs Intravenous Stopped 12/26/16 1310)    Followed by  0.9 %  sodium chloride infusion (1,000 mLs Intravenous New Bag/Given 12/26/16 1051)  levofloxacin (LEVAQUIN) IVPB 750 mg (not administered)  acetaminophen (TYLENOL) tablet 650 mg (650 mg Oral Given 12/26/16 1051)  Initial Impression / Assessment and Plan / ED Course  I have reviewed the triage vital signs and the nursing notes.  Pertinent labs & imaging results that were available during my care of the patient were reviewed by me and considered in my medical decision making (see chart for details).     Consult: Hospitalist for admission.  Final Clinical Impressions(s) / ED Diagnoses   Final diagnoses:  Hyponatremia  Acute cystitis without hematuria  Severe comorbid illness  Fever, unspecified fever cause   Patient has history of metastatic adenocarcinoma lung cancer. She developed onset of vomiting with one episode of diarrhea and then extreme weakness and fever. urinalysis is consistent with UTI. Patient's blood pressures has been greater than 90, has not been tachycardic and no lactic acidosis. Antibiotics and fluids initiated for UTI with associated constitutional symptoms and severe comorbid illness. Plan will be for admission. New Prescriptions New Prescriptions   No medications on file     Charlesetta Shanks, MD 12/26/16 [phone]

## 2016-12-26 NOTE — ED Triage Notes (Signed)
Patient has a history of lung cancer.  She took oral chemo pills this morning.

## 2016-12-27 LAB — URINE CULTURE

## 2016-12-31 LAB — CULTURE, BLOOD (ROUTINE X 2)
CULTURE: NO GROWTH
Culture: NO GROWTH

## 2017-01-17 ENCOUNTER — Encounter: Payer: Self-pay | Admitting: Internal Medicine

## 2017-01-17 ENCOUNTER — Telehealth: Payer: Self-pay | Admitting: Pharmacist

## 2017-01-17 NOTE — Telephone Encounter (Signed)
Oral Chemotherapy Pharmacist Encounter  I met patient in lobby to complete AZ[employer] application. Application completed and faxed to [employer] at [phone]. Patient knows to call the office with questions or concerns.  This encounter will continue to be updated until final determination.  Oral Oncology Clinic will continue to follow.   Johny Drilling, PharmD, BCPS, BCOP 01/17/2017  4:37 PM Oral Oncology Clinic [phone]

## 2017-01-17 NOTE — Progress Notes (Signed)
Patient came in with questions on [employer] assistance. She states she was told at the OP pharmacy her copay would be $3000. Patient was sure she had copay assistance and did not have to pay anything last month. Reviewed her notes and copay assistance book and saw that patient did have assistance through [employer].  Called Larena Glassman at [facility] who informed me patient still had funds available if her insurance was still commercial. Patient presented a new insurance card which she showed at the pharmacy and patient is now [employer].  [place] in the pharmacy whom came and spoke with patient. Evelena Peat will speak with patient and enroll her in AZ[employer]ME for Tagrisso when she returns with income verification. Evelena Peat provided her card to the patient with her phone number and advised patient to ask for her at the desk when she returns.

## 2017-01-22 DIAGNOSIS — D649 Anemia, unspecified: Secondary | ICD-10-CM | POA: Diagnosis not present

## 2017-01-22 DIAGNOSIS — I10 Essential (primary) hypertension: Secondary | ICD-10-CM | POA: Diagnosis not present

## 2017-01-23 NOTE — Telephone Encounter (Signed)
Oral Chemotherapy Pharmacist Encounter  Received notification from AZ[employer]me that patient has been successfully enrolled into their program to receive Tagrisso for 1 year at $0 charge from the manufacturer.  I called patient to alert her of good news. Phone number for [employer] provided to patient for ordering refills and checking on order status [phone]).  Patient expressed appreciation and understanding. All questions answered. Patient knows to call the office with questions or concerns. Patient instructed to reach back out to [place] when it is time to re-enroll for program next year.  No further needs from [facility] identified at this time. Oral Oncology Clinic will sign off. Please let us know if we can be of assistance in the future.   Johny Drilling, PharmD, BCPS, BCOP 01/23/2017  4:30 PM Oral Oncology Clinic [phone]

## 2017-01-29 ENCOUNTER — Encounter (HOSPITAL_COMMUNITY): Payer: Self-pay

## 2017-01-29 ENCOUNTER — Ambulatory Visit (HOSPITAL_COMMUNITY)
Admission: RE | Admit: 2017-01-29 | Discharge: 2017-01-29 | Disposition: A | Discharge status: Home / Self Care | Payer: Medicare Other | Source: Ambulatory Visit | Attending: Internal Medicine | Admitting: Internal Medicine

## 2017-01-29 ENCOUNTER — Other Ambulatory Visit (HOSPITAL_BASED_OUTPATIENT_CLINIC_OR_DEPARTMENT_OTHER): Payer: Medicare Other

## 2017-01-29 DIAGNOSIS — C7951 Secondary malignant neoplasm of bone: Secondary | ICD-10-CM | POA: Diagnosis not present

## 2017-01-29 DIAGNOSIS — Z9889 Other specified postprocedural states: Secondary | ICD-10-CM | POA: Insufficient documentation

## 2017-01-29 DIAGNOSIS — C3492 Malignant neoplasm of unspecified part of left bronchus or lung: Secondary | ICD-10-CM | POA: Diagnosis not present

## 2017-01-29 DIAGNOSIS — J90 Pleural effusion, not elsewhere classified: Secondary | ICD-10-CM | POA: Insufficient documentation

## 2017-01-29 DIAGNOSIS — C799 Secondary malignant neoplasm of unspecified site: Secondary | ICD-10-CM | POA: Diagnosis present

## 2017-01-29 DIAGNOSIS — I700 Atherosclerosis of aorta: Secondary | ICD-10-CM | POA: Diagnosis not present

## 2017-01-29 DIAGNOSIS — Z85118 Personal history of other malignant neoplasm of bronchus and lung: Secondary | ICD-10-CM | POA: Diagnosis not present

## 2017-01-29 DIAGNOSIS — D630 Anemia in neoplastic disease: Secondary | ICD-10-CM

## 2017-01-29 DIAGNOSIS — C3490 Malignant neoplasm of unspecified part of unspecified bronchus or lung: Secondary | ICD-10-CM

## 2017-01-29 DIAGNOSIS — Z5111 Encounter for antineoplastic chemotherapy: Secondary | ICD-10-CM

## 2017-01-29 LAB — COMPREHENSIVE METABOLIC PANEL
ALT: 31 U/L (ref 0–55)
AST: 34 U/L (ref 5–34)
Albumin: 3.4 g/dL — ABNORMAL LOW (ref 3.5–5.0)
Alkaline Phosphatase: 73 U/L (ref 40–150)
Anion Gap: 7 mEq/L (ref 3–11)
BUN: 29.6 mg/dL — AB (ref 7.0–26.0)
CALCIUM: 9.7 mg/dL (ref 8.4–10.4)
CHLORIDE: 105 meq/L (ref 98–109)
CO2: 27 mEq/L (ref 22–29)
CREATININE: 0.8 mg/dL (ref 0.6–1.1)
EGFR: 71 mL/min/{1.73_m2} — ABNORMAL LOW (ref >90)
GLUCOSE: 75 mg/dL (ref 70–140)
Potassium: 4.3 mEq/L (ref 3.5–5.1)
SODIUM: 139 meq/L (ref 136–145)
Total Bilirubin: 0.45 mg/dL (ref 0.20–1.20)
Total Protein: 7.8 g/dL (ref 6.4–8.3)

## 2017-01-29 LAB — CBC WITH DIFFERENTIAL/PLATELET
BASO%: 0.2 % (ref 0.0–2.0)
Basophils Absolute: 0 10*3/uL (ref 0.0–0.1)
EOS%: 0.7 % (ref 0.0–7.0)
Eosinophils Absolute: 0 10*3/uL (ref 0.0–0.5)
HCT: 31.9 % — ABNORMAL LOW (ref 34.8–46.6)
HEMOGLOBIN: 10.4 g/dL — AB (ref 11.6–15.9)
LYMPH#: 0.9 10*3/uL (ref 0.9–3.3)
LYMPH%: 14.8 % (ref 14.0–49.7)
MCH: 29.1 pg (ref 25.1–34.0)
MCHC: 32.6 g/dL (ref 31.5–36.0)
MCV: 89.1 fL (ref 79.5–101.0)
MONO#: 0.6 10*3/uL (ref 0.1–0.9)
MONO%: 9.6 % (ref 0.0–14.0)
NEUT#: 4.5 10*3/uL (ref 1.5–6.5)
NEUT%: 74.7 % (ref 38.4–76.8)
Platelets: 85 10*3/uL — ABNORMAL LOW (ref 145–400)
RBC: 3.58 10*6/uL — ABNORMAL LOW (ref 3.70–5.45)
RDW: 15.8 % — AB (ref 11.2–14.5)
WBC: 6 10*3/uL (ref 3.9–10.3)

## 2017-01-29 MED ORDER — IOPAMIDOL (ISOVUE-300) INJECTION 61%
INTRAVENOUS | Status: AC
  Administered 2017-01-29: 80 mL
  Filled 2017-01-29: qty 100

## 2017-01-31 ENCOUNTER — Ambulatory Visit: Payer: 59 | Admitting: Internal Medicine

## 2017-02-05 ENCOUNTER — Encounter: Payer: Self-pay | Admitting: Internal Medicine

## 2017-02-05 ENCOUNTER — Ambulatory Visit (HOSPITAL_BASED_OUTPATIENT_CLINIC_OR_DEPARTMENT_OTHER): Payer: Medicare Other | Admitting: Internal Medicine

## 2017-02-05 ENCOUNTER — Telehealth: Payer: Self-pay | Admitting: Internal Medicine

## 2017-02-05 VITALS — BP 145/55 | HR 78 | Temp 98.0°F | Resp 18 | Wt 146.7 lb

## 2017-02-05 DIAGNOSIS — I10 Essential (primary) hypertension: Secondary | ICD-10-CM

## 2017-02-05 DIAGNOSIS — Z5111 Encounter for antineoplastic chemotherapy: Secondary | ICD-10-CM

## 2017-02-05 DIAGNOSIS — D696 Thrombocytopenia, unspecified: Secondary | ICD-10-CM | POA: Diagnosis not present

## 2017-02-05 DIAGNOSIS — C7951 Secondary malignant neoplasm of bone: Secondary | ICD-10-CM | POA: Diagnosis not present

## 2017-02-05 DIAGNOSIS — C3492 Malignant neoplasm of unspecified part of left bronchus or lung: Secondary | ICD-10-CM

## 2017-02-05 DIAGNOSIS — C3490 Malignant neoplasm of unspecified part of unspecified bronchus or lung: Secondary | ICD-10-CM

## 2017-02-05 DIAGNOSIS — D6481 Anemia due to antineoplastic chemotherapy: Secondary | ICD-10-CM

## 2017-02-05 DIAGNOSIS — C799 Secondary malignant neoplasm of unspecified site: Secondary | ICD-10-CM

## 2017-02-05 NOTE — Telephone Encounter (Signed)
Appointments scheduled per 02/05/17 los. Patient was given a copy of the AVS report and appointment schedule per 02/05/17 los.

## 2017-02-05 NOTE — Progress Notes (Signed)
[place] [phone]   [phone]  OFFICE PROGRESS NOTE  PROVIDER NOT IN SYSTEM No address on file  DIAGNOSIS: Metastatic non-small cell lung cancer, adenocarcinoma with positive EGFR mutation in exon 19 and development of resistant T790M mutation. This was initially diagnosed in July 2016.  PRIOR THERAPY: 1) Status post radiotherapy to the left face rib metastasis under the care of Dr. Lisbeth Renshaw.  2) stereotactic radiotherapy to the enlarging left lower lobe lung nodule under the care of Dr. Lisbeth Renshaw. 3) treatment with Tarceva 150 mg by mouth daily, therapy beginning 07/20/2012. Status post approximately 36 months of therapy. This was discontinued secondary to disease progression  CURRENT THERAPY: 1) prednisone 80 mg by mouth daily started 02/03/2016 status post more than 11 months of treatment. 2) Xgeva 120 mg subcutaneously every 4 weeks for bone metastasis.  INTERVAL HISTORY: Jacqueline Leonard 71 y.o. female came to the clinic today for follow-up visit accompanied by her husband. The patient is feeling fine today with no specific complaints. She was seen at the emergency department a week ago because of weakness in her legs which resolved in few hours. She denied having any current weight loss or night sweats. She has no fever or chills. She has no nausea, vomiting, diarrhea or constipation. She had repeat CT scan of the chest, abdomen and pelvis performed recently and she is here for evaluation and discussion of her scan results.   MEDICAL HISTORY: Past Medical History:  Diagnosis Date  . Allergy   . Anxiety   . Cancer, metastatic to bone ([place]) 06/19/12   bx=L 5th ribmetastatic Adenocarcinoma with known lung mass  . Encounter for antineoplastic chemotherapy 02/14/2016  . External hemorrhoids   . Hyperlipidemia   . Hypertension   . Lung mass   . Radiation 07/11/12-07/24/12   Palliative lung tx 30 gray in 10 fx  . S/P radiation therapy  07/22/14-07/31/14   left lung/60gy/45f  . Status post chemoradiation    Tarceva  . Vitamin D deficiency     ALLERGIES:  is allergic to penicillins.  MEDICATIONS:  Current Outpatient Prescriptions  Medication Sig Dispense Refill  . acetaminophen (TYLENOL) 500 MG tablet Take 2 tablets (1,000 mg total) by mouth every 6 (six) hours as needed. 30 tablet 0  . amLODipine (NORVASC) 5 MG tablet Take 2.5 mg by mouth daily.     . cephALEXin (KEFLEX) 500 MG capsule Take 2 capsules (1,000 mg total) by mouth 2 (two) times daily. 28 capsule 0  . cholecalciferol (VITAMIN D) 1000 UNITS tablet Take 1,000 Units by mouth daily.    .Marland KitchenFeFum-FePoly-FA-B Cmp-C-Biot (INTEGRA PLUS) CAPS Take 1 capsule by mouth every morning. 30 capsule 2  . HYDROcodone-homatropine (HYCODAN) 5-1.5 MG/5ML syrup Take 5 mLs by mouth every 6 (six) hours as needed for cough. (Patient not taking: Reported on 12/26/2016) 120 mL 0  . ondansetron (ZOFRAN ODT) 4 MG disintegrating tablet Take 1 tablet (4 mg total) by mouth every 4 (four) hours as needed for nausea or vomiting. 20 tablet 0  . osimertinib mesylate (TAGRISSO) 80 MG tablet Take 1 tablet (80 mg total) by mouth daily. 90 tablet 0  . potassium chloride (K-DUR) 10 MEQ tablet Take 1 tablet by mouth daily.     No current facility-administered medications for this visit.     SURGICAL HISTORY:  Past Surgical History:  Procedure Laterality Date  . APPENDECTOMY    . OVARIAN CYST REMOVAL    . SOFT TISSUE BIOPSY  06/19/12   L 5th rib=metastatic adenocarcinoma    REVIEW OF SYSTEMS:  Constitutional: positive for fatigue Eyes: negative Ears, nose, mouth, throat, and face: negative Respiratory: negative Cardiovascular: negative Gastrointestinal: negative Genitourinary:negative Integument/breast: negative Hematologic/lymphatic: negative Musculoskeletal:negative Neurological: negative Behavioral/Psych: negative Endocrine: negative Allergic/Immunologic: negative   PHYSICAL  EXAMINATION: General appearance: alert, cooperative and no distress Head: Normocephalic, without obvious abnormality, atraumatic Neck: no adenopathy, no JVD, supple, symmetrical, trachea midline and thyroid not enlarged, symmetric, no tenderness/mass/nodules Lymph nodes: Cervical, supraclavicular, and axillary nodes normal. Resp: clear to auscultation bilaterally Back: symmetric, no curvature. ROM normal. No CVA tenderness. Cardio: regular rate and rhythm, S1, S2 normal, no murmur, click, rub or gallop GI: soft, non-tender; bowel sounds normal; no masses,  no organomegaly Extremities: extremities normal, atraumatic, no cyanosis or edema Neurologic: Alert and oriented X 3, normal strength and tone. Normal symmetric reflexes. Normal coordination and gait  ECOG PERFORMANCE STATUS: 1 - Symptomatic but completely ambulatory  Blood pressure (!) 145/55, pulse 78, temperature 98 F (36.7 C), temperature source Oral, resp. rate 18, weight 146 lb 11.2 oz (66.5 kg), SpO2 100 %.  LABORATORY DATA: Lab Results  Component Value Date   WBC 6.0 01/29/2017   HGB 10.4 (L) 01/29/2017   HCT 31.9 (L) 01/29/2017   MCV 89.1 01/29/2017   PLT 85 (L) 01/29/2017      Chemistry      Component Value Date/Time   NA 139 01/29/2017 1517   K 4.3 01/29/2017 1517   CL 99 (L) 12/26/2016 1032   CL 102 05/06/2013 1511   CO2 27 01/29/2017 1517   BUN 29.6 (H) 01/29/2017 1517   CREATININE 0.8 01/29/2017 1517      Component Value Date/Time   CALCIUM 9.7 01/29/2017 1517   ALKPHOS 73 01/29/2017 1517   AST 34 01/29/2017 1517   ALT 31 01/29/2017 1517   BILITOT 0.45 01/29/2017 1517       RADIOGRAPHIC STUDIES: Ct Chest W Contrast  Result Date: 01/30/2017 CLINICAL DATA:  Metastatic non-small-cell lung cancer, adenocarcinoma initially diagnosed July 2016. Status post radiotherapy to the left rib metastasis and enlarging left lower lobe lung lesion. EXAM: CT CHEST, ABDOMEN, AND PELVIS WITH CONTRAST TECHNIQUE:  Multidetector CT imaging of the chest, abdomen and pelvis was performed following the standard protocol during bolus administration of intravenous contrast. CONTRAST:  37m ISOVUE-300 IOPAMIDOL (ISOVUE-300) INJECTION 61% COMPARISON:  10/31/2016 FINDINGS: CT CHEST FINDINGS Cardiovascular: Heart size upper normal. No pericardial effusion. Atherosclerotic calcification is noted in the wall of the thoracic aorta. Mediastinum/Nodes: No mediastinal lymphadenopathy. There is no hilar lymphadenopathy. The esophagus has normal imaging features. There is no axillary lymphadenopathy. Lungs/Pleura: The left lower lobe bronchiectasis and focal airspace consolidation is unchanged in the interval, compatible with prior radiation therapy. Small left pleural effusion is similar. Patient is again noted to have diffuse interlobular septal thickening and peripheral reticulation scattered throughout both lungs, overall imaging features are stable. No new or progressive lung findings on the current study. Musculoskeletal: No change and previously described bony metastatic disease. Dominant, expansile left fourth rib lesion is stable. CT ABDOMEN PELVIS FINDINGS Hepatobiliary: Portal venous phase imaging shows heterogeneous liver enhancement without a discrete mass lesion evident. There is no evidence for gallstones, gallbladder wall thickening, or pericholecystic fluid. No intrahepatic or extrahepatic biliary dilation. Pancreas: No focal mass lesion. No dilatation of the main duct. No intraparenchymal cyst. No peripancreatic edema. Spleen: No splenomegaly. No focal mass lesion. Adrenals/Urinary Tract: No adrenal nodule or mass. No enhancing lesion right kidney.  3.1 cm simple cyst lower pole left kidney is stable. No evidence for hydroureter. The urinary bladder appears normal for the degree of distention. Stomach/Bowel: Stomach is nondistended. No gastric wall thickening. No evidence of outlet obstruction. Duodenum is normally positioned  as is the ligament of Treitz. No small bowel wall thickening. No small bowel dilatation. The terminal ileum is normal. The appendix is not visualized, but there is no edema or inflammation in the region of the cecum. No gross colonic mass. No colonic wall thickening. No substantial diverticular change. Vascular/Lymphatic: There is abdominal aortic atherosclerosis without aneurysm. There is no gastrohepatic or hepatoduodenal ligament lymphadenopathy. No intraperitoneal or retroperitoneal lymphadenopathy. No pelvic sidewall lymphadenopathy. Reproductive: Calcified fibroids noted in the uterus. There is no adnexal mass. Other: No intraperitoneal free fluid. Musculoskeletal: Multiple lytic and sclerotic bony lesions are again noted. Tiny sclerotic foci seen in the T12 vertebral body previously have decreased in the interval sclerotic T11 lesion on the prior study has also decreased. Bilateral pars interarticularis defects are evident at L5. IMPRESSION: 1. Stable exam since 10/31/2016. No new or progressive findings in the chest, abdomen, or pelvis. 2. Post treatment changes left lower lobe of with diffuse septal thickening and areas of distortion in the lungs bilaterally, features previous described as treated lymphangitic tumor. 3. Stable small left pleural effusion. 4. Bone metastases again noted although multiple small sclerotic foci in the thoracolumbar spine on the previous study have decreased in the interval. Electronically Signed   By: Misty Stanley M.D.   On: 01/30/2017 08:46   Ct Abdomen Pelvis W Contrast  Result Date: 01/30/2017 CLINICAL DATA:  Metastatic non-small-cell lung cancer, adenocarcinoma initially diagnosed July 2016. Status post radiotherapy to the left rib metastasis and enlarging left lower lobe lung lesion. EXAM: CT CHEST, ABDOMEN, AND PELVIS WITH CONTRAST TECHNIQUE: Multidetector CT imaging of the chest, abdomen and pelvis was performed following the standard protocol during bolus  administration of intravenous contrast. CONTRAST:  39m ISOVUE-300 IOPAMIDOL (ISOVUE-300) INJECTION 61% COMPARISON:  10/31/2016 FINDINGS: CT CHEST FINDINGS Cardiovascular: Heart size upper normal. No pericardial effusion. Atherosclerotic calcification is noted in the wall of the thoracic aorta. Mediastinum/Nodes: No mediastinal lymphadenopathy. There is no hilar lymphadenopathy. The esophagus has normal imaging features. There is no axillary lymphadenopathy. Lungs/Pleura: The left lower lobe bronchiectasis and focal airspace consolidation is unchanged in the interval, compatible with prior radiation therapy. Small left pleural effusion is similar. Patient is again noted to have diffuse interlobular septal thickening and peripheral reticulation scattered throughout both lungs, overall imaging features are stable. No new or progressive lung findings on the current study. Musculoskeletal: No change and previously described bony metastatic disease. Dominant, expansile left fourth rib lesion is stable. CT ABDOMEN PELVIS FINDINGS Hepatobiliary: Portal venous phase imaging shows heterogeneous liver enhancement without a discrete mass lesion evident. There is no evidence for gallstones, gallbladder wall thickening, or pericholecystic fluid. No intrahepatic or extrahepatic biliary dilation. Pancreas: No focal mass lesion. No dilatation of the main duct. No intraparenchymal cyst. No peripancreatic edema. Spleen: No splenomegaly. No focal mass lesion. Adrenals/Urinary Tract: No adrenal nodule or mass. No enhancing lesion right kidney. 3.1 cm simple cyst lower pole left kidney is stable. No evidence for hydroureter. The urinary bladder appears normal for the degree of distention. Stomach/Bowel: Stomach is nondistended. No gastric wall thickening. No evidence of outlet obstruction. Duodenum is normally positioned as is the ligament of Treitz. No small bowel wall thickening. No small bowel dilatation. The terminal ileum is  normal. The appendix is  not visualized, but there is no edema or inflammation in the region of the cecum. No gross colonic mass. No colonic wall thickening. No substantial diverticular change. Vascular/Lymphatic: There is abdominal aortic atherosclerosis without aneurysm. There is no gastrohepatic or hepatoduodenal ligament lymphadenopathy. No intraperitoneal or retroperitoneal lymphadenopathy. No pelvic sidewall lymphadenopathy. Reproductive: Calcified fibroids noted in the uterus. There is no adnexal mass. Other: No intraperitoneal free fluid. Musculoskeletal: Multiple lytic and sclerotic bony lesions are again noted. Tiny sclerotic foci seen in the T12 vertebral body previously have decreased in the interval sclerotic T11 lesion on the prior study has also decreased. Bilateral pars interarticularis defects are evident at L5. IMPRESSION: 1. Stable exam since 10/31/2016. No new or progressive findings in the chest, abdomen, or pelvis. 2. Post treatment changes left lower lobe of with diffuse septal thickening and areas of distortion in the lungs bilaterally, features previous described as treated lymphangitic tumor. 3. Stable small left pleural effusion. 4. Bone metastases again noted although multiple small sclerotic foci in the thoracolumbar spine on the previous study have decreased in the interval. Electronically Signed   By: Misty Stanley M.D.   On: 01/30/2017 08:46    ASSESSMENT AND PLAN:  This is a very pleasant 71 years old Hispanic female with a stage IV non-small cell lung cancer, adenocarcinoma with positive EGFR mutation in exon 19 diagnosed in July 2016 status post 3 years of treatment with Tarceva discontinued secondary to disease progression and development of T790M EGFR resistant mutation. She is status post 11 months of this treatment. The patient had recent CT scan of the chest, abdomen and pelvis. I personally and independently reviewed the scan images and discussed the results with the  patient and her husband today. Her scan showed no evidence for disease progression. I recommended for the patient to continue her current treatment with Tagrisso 80 mg by mouth daily. For hypertension, she was advised to take her blood pressure medication as prescribed. For the chemotherapy-induced anemia, we will continue to monitor her blood count closely. For the thrombocytopenia, this is most likely secondary to treatment with Tagrisso and will continue to monitor it closely and the patient is currently asymptomatic. She was advised to call immediately if she has any concerning symptoms in the interval. The patient voices understanding of current disease status and treatment options and is in agreement with the current care plan. All questions were answered. The patient knows to call the clinic with any problems, questions or concerns. We can certainly see the patient much sooner if necessary.  Disclaimer: This note was dictated with voice recognition software. Similar sounding words can inadvertently be transcribed and may not be corrected upon review.

## 2017-03-12 ENCOUNTER — Encounter: Payer: Self-pay | Admitting: Internal Medicine

## 2017-03-12 ENCOUNTER — Telehealth: Payer: Self-pay | Admitting: Internal Medicine

## 2017-03-12 ENCOUNTER — Other Ambulatory Visit (HOSPITAL_BASED_OUTPATIENT_CLINIC_OR_DEPARTMENT_OTHER): Payer: Medicare Other

## 2017-03-12 ENCOUNTER — Ambulatory Visit (HOSPITAL_BASED_OUTPATIENT_CLINIC_OR_DEPARTMENT_OTHER): Payer: Medicare Other | Admitting: Internal Medicine

## 2017-03-12 VITALS — BP 154/49 | HR 63 | Temp 97.9°F | Resp 18 | Ht 62.0 in | Wt 150.3 lb

## 2017-03-12 DIAGNOSIS — D6481 Anemia due to antineoplastic chemotherapy: Secondary | ICD-10-CM | POA: Diagnosis not present

## 2017-03-12 DIAGNOSIS — C7951 Secondary malignant neoplasm of bone: Secondary | ICD-10-CM

## 2017-03-12 DIAGNOSIS — C3492 Malignant neoplasm of unspecified part of left bronchus or lung: Secondary | ICD-10-CM

## 2017-03-12 DIAGNOSIS — Z5111 Encounter for antineoplastic chemotherapy: Secondary | ICD-10-CM

## 2017-03-12 DIAGNOSIS — R5383 Other fatigue: Secondary | ICD-10-CM

## 2017-03-12 DIAGNOSIS — C799 Secondary malignant neoplasm of unspecified site: Secondary | ICD-10-CM

## 2017-03-12 DIAGNOSIS — D630 Anemia in neoplastic disease: Secondary | ICD-10-CM

## 2017-03-12 LAB — CBC WITH DIFFERENTIAL/PLATELET
BASO%: 0.4 % (ref 0.0–2.0)
BASOS ABS: 0 10*3/uL (ref 0.0–0.1)
EOS%: 1.9 % (ref 0.0–7.0)
Eosinophils Absolute: 0.1 10*3/uL (ref 0.0–0.5)
HEMATOCRIT: 32.9 % — AB (ref 34.8–46.6)
HGB: 10.8 g/dL — ABNORMAL LOW (ref 11.6–15.9)
LYMPH%: 16.4 % (ref 14.0–49.7)
MCH: 29.4 pg (ref 25.1–34.0)
MCHC: 32.9 g/dL (ref 31.5–36.0)
MCV: 89.4 fL (ref 79.5–101.0)
MONO#: 0.4 10*3/uL (ref 0.1–0.9)
MONO%: 8.3 % (ref 0.0–14.0)
NEUT#: 3.4 10*3/uL (ref 1.5–6.5)
NEUT%: 73 % (ref 38.4–76.8)
Platelets: 81 10*3/uL — ABNORMAL LOW (ref 145–400)
RBC: 3.68 10*6/uL — ABNORMAL LOW (ref 3.70–5.45)
RDW: 15.2 % — ABNORMAL HIGH (ref 11.2–14.5)
WBC: 4.7 10*3/uL (ref 3.9–10.3)
lymph#: 0.8 10*3/uL — ABNORMAL LOW (ref 0.9–3.3)

## 2017-03-12 LAB — COMPREHENSIVE METABOLIC PANEL
ALT: 21 U/L (ref 0–55)
AST: 26 U/L (ref 5–34)
Albumin: 3.4 g/dL — ABNORMAL LOW (ref 3.5–5.0)
Alkaline Phosphatase: 76 U/L (ref 40–150)
Anion Gap: 7 mEq/L (ref 3–11)
BUN: 24.2 mg/dL (ref 7.0–26.0)
CALCIUM: 9.3 mg/dL (ref 8.4–10.4)
CHLORIDE: 107 meq/L (ref 98–109)
CO2: 27 mEq/L (ref 22–29)
Creatinine: 0.8 mg/dL (ref 0.6–1.1)
EGFR: 75 mL/min/{1.73_m2} — AB (ref >90)
Glucose: 88 mg/dl (ref 70–140)
POTASSIUM: 4.1 meq/L (ref 3.5–5.1)
SODIUM: 140 meq/L (ref 136–145)
Total Bilirubin: 0.68 mg/dL (ref 0.20–1.20)
Total Protein: 7.5 g/dL (ref 6.4–8.3)

## 2017-03-12 MED ORDER — INTEGRA PLUS PO CAPS: 1.0000 | ORAL_CAPSULE | Freq: Every morning | ORAL | 2 refills | Status: DC

## 2017-03-12 NOTE — Progress Notes (Signed)
[place] Telephone:(336) [phone]   [phone]) [phone]  OFFICE PROGRESS NOTE  PROVIDER NOT IN SYSTEM No address on file  DIAGNOSIS: Metastatic non-small cell lung cancer, adenocarcinoma with positive EGFR mutation in exon 19 and development of resistant T790M mutation. This was initially diagnosed in July 2013.  PRIOR THERAPY: 1) Status post radiotherapy to the left face rib metastasis under the care of Dr. Lisbeth Renshaw.  2) stereotactic radiotherapy to the enlarging left lower lobe lung nodule under the care of Dr. Lisbeth Renshaw. 3) treatment with Tarceva 150 mg by mouth daily, therapy beginning 07/20/2012. Status post approximately 36 months of therapy. This was discontinued secondary to disease progression  CURRENT THERAPY: 1) Tagrisso 80 mg by mouth daily started 02/03/2016 status post more than 12 months of treatment. 2) Xgeva 120 mg subcutaneously every 4 weeks for bone metastasis.  INTERVAL HISTORY: Jacqueline Leonard 71 y.o. female returns to the clinic today for follow-up visit accompanied by her niece [name]. The patient is feeling fine today with no specific complaints except for mild fatigue. She is tolerating her current treatment with Tagrisso fairly well with no significant adverse effects. She denied having any skin rash or diarrhea. She has no chest pain, shortness of breath, cough or hemoptysis. She has no fever or chills. She denied having any weight loss or night sweats. She has no nausea, vomiting, diarrhea or constipation. She is here today for evaluation and repeat blood work.  MEDICAL HISTORY: Past Medical History:  Diagnosis Date  . Allergy   . Anxiety   . Cancer, metastatic to bone ([place]) 06/19/12   bx=L 5th ribmetastatic Adenocarcinoma with known lung mass  . Encounter for antineoplastic chemotherapy 02/14/2016  . External hemorrhoids   . Hyperlipidemia   . Hypertension   . Lung mass   . Radiation 07/11/12-07/24/12   Palliative lung tx 30 gray in 10 fx  .  S/P radiation therapy 07/22/14-07/31/14   left lung/60gy/40f  . Status post chemoradiation    Tarceva  . Vitamin D deficiency     ALLERGIES:  is allergic to penicillins.  MEDICATIONS:  Current Outpatient Prescriptions  Medication Sig Dispense Refill  . acetaminophen (TYLENOL) 500 MG tablet Take 2 tablets (1,000 mg total) by mouth every 6 (six) hours as needed. 30 tablet 0  . amLODipine (NORVASC) 5 MG tablet Take 2.5 mg by mouth daily.     . cholecalciferol (VITAMIN D) 1000 UNITS tablet Take 1,000 Units by mouth daily.    .Marland KitchenFeFum-FePoly-FA-B Cmp-C-Biot (INTEGRA PLUS) CAPS Take 1 capsule by mouth every morning. 30 capsule 2  . HYDROcodone-homatropine (HYCODAN) 5-1.5 MG/5ML syrup Take 5 mLs by mouth every 6 (six) hours as needed for cough. (Patient not taking: Reported on 12/26/2016) 120 mL 0  . ondansetron (ZOFRAN ODT) 4 MG disintegrating tablet Take 1 tablet (4 mg total) by mouth every 4 (four) hours as needed for nausea or vomiting. (Patient not taking: Reported on 02/05/2017) 20 tablet 0  . osimertinib mesylate (TAGRISSO) 80 MG tablet Take 1 tablet (80 mg total) by mouth daily. 90 tablet 0  . potassium chloride (K-DUR) 10 MEQ tablet Take 1 tablet by mouth daily.     No current facility-administered medications for this visit.     SURGICAL HISTORY:  Past Surgical History:  Procedure Laterality Date  . APPENDECTOMY    . OVARIAN CYST REMOVAL    . SOFT TISSUE BIOPSY  06/19/12   L 5th rib=metastatic adenocarcinoma    REVIEW OF SYSTEMS:  A comprehensive review of systems was negative except for: Constitutional: positive for fatigue   PHYSICAL EXAMINATION: General appearance: alert, cooperative, fatigued and no distress Head: Normocephalic, without obvious abnormality, atraumatic Neck: no adenopathy, no JVD, supple, symmetrical, trachea midline and thyroid not enlarged, symmetric, no tenderness/mass/nodules Lymph nodes: Cervical, supraclavicular, and axillary nodes normal. Resp: clear to  auscultation bilaterally Back: symmetric, no curvature. ROM normal. No CVA tenderness. Cardio: regular rate and rhythm, S1, S2 normal, no murmur, click, rub or gallop GI: soft, non-tender; bowel sounds normal; no masses,  no organomegaly Extremities: extremities normal, atraumatic, no cyanosis or edema  ECOG PERFORMANCE STATUS: 1 - Symptomatic but completely ambulatory  Blood pressure (!) 154/49, pulse 63, temperature 97.9 F (36.6 C), temperature source Oral, resp. rate 18, height _0  (1.575 m), weight 150 lb 4.8 oz (68.2 kg), SpO2 100 %.  LABORATORY DATA: Lab Results  Component Value Date   WBC 4.7 03/12/2017   HGB 10.8 (L) 03/12/2017   HCT 32.9 (L) 03/12/2017   MCV 89.4 03/12/2017   PLT 81 (L) 03/12/2017      Chemistry      Component Value Date/Time   NA 139 01/29/2017 1517   K 4.3 01/29/2017 1517   CL 99 (L) 12/26/2016 1032   CL 102 05/06/2013 1511   CO2 27 01/29/2017 1517   BUN 29.6 (H) 01/29/2017 1517   CREATININE 0.8 01/29/2017 1517      Component Value Date/Time   CALCIUM 9.7 01/29/2017 1517   ALKPHOS 73 01/29/2017 1517   AST 34 01/29/2017 1517   ALT 31 01/29/2017 1517   BILITOT 0.45 01/29/2017 1517       RADIOGRAPHIC STUDIES: No results found.  ASSESSMENT AND PLAN:  This is a very pleasant 71 years old Hispanic female with stage IV non-small cell lung cancer, adenocarcinoma with positive EGFR mutation diagnosed in July 2013 status post 3 years of treatment with Tarceva discontinued secondary to disease progression and development of T790M EGFR resistant mutation. The patient is currently undergoing treatment with Tagrisso 80 mg by mouth daily status post 12 months and has been tolerating her treatment fairly well. Her CBC today is unremarkable except for mild anemia. Comprehensive metabolic panel is a pending I recommended for the patient to continue her current treatment with Tagrisso with the same dose. Chemotherapy-induced anemia, she will continue on  Integra plus and I sent refill of her medication to her pharmacy. I will see her back for follow-up visit in one month's for evaluation and repeat blood work. She was advised to call immediately if she has any concerning symptoms in the interval. The patient voices understanding of current disease status and treatment options and is in agreement with the current care plan. All questions were answered. The patient knows to call the clinic with any problems, questions or concerns. We can certainly see the patient much sooner if necessary. I spent 10 minutes counseling the patient face to face. The total time spent in the appointment was 15 minutes.  Disclaimer: This note was dictated with voice recognition software. Similar sounding words can inadvertently be transcribed and may not be corrected upon review.

## 2017-03-12 NOTE — Telephone Encounter (Signed)
Appointments scheduled per 4.16.18 LOS. Patient given AVS report and calendars with future scheduled appointments. Patient requested to return in June per vacation time. Okay per MM.

## 2017-03-15 ENCOUNTER — Other Ambulatory Visit: Payer: Self-pay | Admitting: *Deleted

## 2017-03-15 DIAGNOSIS — C3490 Malignant neoplasm of unspecified part of unspecified bronchus or lung: Secondary | ICD-10-CM

## 2017-03-15 MED ORDER — OSIMERTINIB MESYLATE 80 MG PO TABS: 80.0000 mg | ORAL_TABLET | Freq: Every day | ORAL | 0 refills | Status: DC

## 2017-04-27 ENCOUNTER — Other Ambulatory Visit: Payer: Self-pay | Admitting: Medical Oncology

## 2017-04-27 DIAGNOSIS — C799 Secondary malignant neoplasm of unspecified site: Secondary | ICD-10-CM

## 2017-04-30 ENCOUNTER — Other Ambulatory Visit (HOSPITAL_BASED_OUTPATIENT_CLINIC_OR_DEPARTMENT_OTHER): Payer: Medicare Other

## 2017-04-30 ENCOUNTER — Telehealth: Payer: Self-pay | Admitting: Internal Medicine

## 2017-04-30 ENCOUNTER — Encounter: Payer: Self-pay | Admitting: Internal Medicine

## 2017-04-30 ENCOUNTER — Ambulatory Visit (HOSPITAL_BASED_OUTPATIENT_CLINIC_OR_DEPARTMENT_OTHER): Payer: Medicare Other | Admitting: Internal Medicine

## 2017-04-30 VITALS — BP 150/48 | HR 70 | Temp 97.9°F | Resp 20 | Ht 62.0 in | Wt 153.0 lb

## 2017-04-30 DIAGNOSIS — C3432 Malignant neoplasm of lower lobe, left bronchus or lung: Secondary | ICD-10-CM

## 2017-04-30 DIAGNOSIS — D696 Thrombocytopenia, unspecified: Secondary | ICD-10-CM

## 2017-04-30 DIAGNOSIS — C799 Secondary malignant neoplasm of unspecified site: Secondary | ICD-10-CM

## 2017-04-30 DIAGNOSIS — Z5111 Encounter for antineoplastic chemotherapy: Secondary | ICD-10-CM

## 2017-04-30 DIAGNOSIS — C3492 Malignant neoplasm of unspecified part of left bronchus or lung: Secondary | ICD-10-CM

## 2017-04-30 DIAGNOSIS — C7951 Secondary malignant neoplasm of bone: Secondary | ICD-10-CM | POA: Diagnosis not present

## 2017-04-30 DIAGNOSIS — D649 Anemia, unspecified: Secondary | ICD-10-CM | POA: Diagnosis not present

## 2017-04-30 LAB — CBC WITH DIFFERENTIAL/PLATELET
BASO%: 0.4 % (ref 0.0–2.0)
BASOS ABS: 0 10*3/uL (ref 0.0–0.1)
EOS%: 1.5 % (ref 0.0–7.0)
Eosinophils Absolute: 0.1 10*3/uL (ref 0.0–0.5)
HEMATOCRIT: 32.7 % — AB (ref 34.8–46.6)
HGB: 10.7 g/dL — ABNORMAL LOW (ref 11.6–15.9)
LYMPH%: 15.2 % (ref 14.0–49.7)
MCH: 28.9 pg (ref 25.1–34.0)
MCHC: 32.7 g/dL (ref 31.5–36.0)
MCV: 88.5 fL (ref 79.5–101.0)
MONO#: 0.4 10*3/uL (ref 0.1–0.9)
MONO%: 9.1 % (ref 0.0–14.0)
NEUT#: 3.3 10*3/uL (ref 1.5–6.5)
NEUT%: 73.8 % (ref 38.4–76.8)
Platelets: 79 10*3/uL — ABNORMAL LOW (ref 145–400)
RBC: 3.7 10*6/uL (ref 3.70–5.45)
RDW: 14.7 % — ABNORMAL HIGH (ref 11.2–14.5)
WBC: 4.5 10*3/uL (ref 3.9–10.3)
lymph#: 0.7 10*3/uL — ABNORMAL LOW (ref 0.9–3.3)

## 2017-04-30 LAB — COMPREHENSIVE METABOLIC PANEL
ALT: 27 U/L (ref 0–55)
AST: 29 U/L (ref 5–34)
Albumin: 3.4 g/dL — ABNORMAL LOW (ref 3.5–5.0)
Alkaline Phosphatase: 72 U/L (ref 40–150)
Anion Gap: 8 mEq/L (ref 3–11)
BUN: 28.3 mg/dL — AB (ref 7.0–26.0)
CALCIUM: 9.3 mg/dL (ref 8.4–10.4)
CHLORIDE: 105 meq/L (ref 98–109)
CO2: 27 mEq/L (ref 22–29)
Creatinine: 1 mg/dL (ref 0.6–1.1)
EGFR: 55 mL/min/{1.73_m2} — AB (ref >90)
Glucose: 100 mg/dl (ref 70–140)
POTASSIUM: 4.2 meq/L (ref 3.5–5.1)
Sodium: 140 mEq/L (ref 136–145)
Total Bilirubin: 0.5 mg/dL (ref 0.20–1.20)
Total Protein: 7.3 g/dL (ref 6.4–8.3)

## 2017-04-30 NOTE — Telephone Encounter (Signed)
Scheduled appt per 6/4 los [facility] radiology to contact patient with CT  Schedule.

## 2017-04-30 NOTE — Progress Notes (Signed)
[place] [phone]   Fax:(336) [phone]  OFFICE PROGRESS NOTE  System, Provider Not In No address on file  DIAGNOSIS: Metastatic non-small cell lung cancer, adenocarcinoma with positive EGFR mutation in exon 19 and development of resistant T790M mutation. This was initially diagnosed in July 2013.  PRIOR THERAPY: 1) Status post radiotherapy to the left face rib metastasis under the care of Dr. Lisbeth Renshaw.  2) stereotactic radiotherapy to the enlarging left lower lobe lung nodule under the care of Dr. Lisbeth Renshaw. 3) treatment with Tarceva 150 mg by mouth daily, therapy beginning 07/20/2012. Status post approximately 36 months of therapy. This was discontinued secondary to disease progression  CURRENT THERAPY: 1) Tagrisso 80 mg by mouth daily started 02/03/2016 status post more than 13 months of treatment. 2) Xgeva 120 mg subcutaneously every 4 weeks for bone metastasis.  INTERVAL HISTORY: Jacqueline Leonard 71 y.o. female returns to the clinic today for follow-up visit accompanied by her niece, Lewayne Bunting. The patient is feeling fine today with no specific complaints. She enjoyed 2 weeks vacation with her family in [place]. She has been tolerating her treatment with Tagrisso fairly well was no significant adverse effects. She denied having any chest pain, shortness breath, cough or hemoptysis. She denied having any fever or chills. She has no nausea, vomiting, diarrhea or constipation. She denied having any skin rash. She is here today for evaluation and repeat blood work.  MEDICAL HISTORY: Past Medical History:  Diagnosis Date  . Allergy   . Anxiety   . Cancer, metastatic to bone ([place]) 06/19/12   bx=L 5th ribmetastatic Adenocarcinoma with known lung mass  . Encounter for antineoplastic chemotherapy 02/14/2016  . External hemorrhoids   . Hyperlipidemia   . Hypertension   . Lung mass   . Radiation 07/11/12-07/24/12   Palliative lung tx 30 gray in 10 fx  . S/P  radiation therapy 07/22/14-07/31/14   left lung/60gy/106f  . Status post chemoradiation    Tarceva  . Vitamin D deficiency     ALLERGIES:  is allergic to penicillins.  MEDICATIONS:  Current Outpatient Prescriptions  Medication Sig Dispense Refill  . acetaminophen (TYLENOL) 500 MG tablet Take 2 tablets (1,000 mg total) by mouth every 6 (six) hours as needed. (Patient not taking: Reported on 03/12/2017) 30 tablet 0  . amLODipine (NORVASC) 5 MG tablet Take 2.5 mg by mouth daily.     . cholecalciferol (VITAMIN D) 1000 UNITS tablet Take 1,000 Units by mouth daily.    .Marland KitchenFeFum-FePoly-FA-B Cmp-C-Biot (INTEGRA PLUS) CAPS Take 1 capsule by mouth every morning. 30 capsule 2  . HYDROcodone-homatropine (HYCODAN) 5-1.5 MG/5ML syrup Take 5 mLs by mouth every 6 (six) hours as needed for cough. (Patient not taking: Reported on 12/26/2016) 120 mL 0  . ondansetron (ZOFRAN ODT) 4 MG disintegrating tablet Take 1 tablet (4 mg total) by mouth every 4 (four) hours as needed for nausea or vomiting. (Patient not taking: Reported on 02/05/2017) 20 tablet 0  . osimertinib mesylate (TAGRISSO) 80 MG tablet Take 1 tablet (80 mg total) by mouth daily. 90 tablet 0  . potassium chloride (K-DUR) 10 MEQ tablet Take 1 tablet by mouth daily.     No current facility-administered medications for this visit.     SURGICAL HISTORY:  Past Surgical History:  Procedure Laterality Date  . APPENDECTOMY    . OVARIAN CYST REMOVAL    . SOFT TISSUE BIOPSY  06/19/12   L 5th rib=metastatic adenocarcinoma    REVIEW OF  SYSTEMS:  A comprehensive review of systems was negative.   PHYSICAL EXAMINATION: General appearance: alert, cooperative and no distress Head: Normocephalic, without obvious abnormality, atraumatic Neck: no adenopathy, no JVD, supple, symmetrical, trachea midline and thyroid not enlarged, symmetric, no tenderness/mass/nodules Lymph nodes: Cervical, supraclavicular, and axillary nodes normal. Resp: clear to auscultation  bilaterally Back: symmetric, no curvature. ROM normal. No CVA tenderness. Cardio: regular rate and rhythm, S1, S2 normal, no murmur, click, rub or gallop GI: soft, non-tender; bowel sounds normal; no masses,  no organomegaly Extremities: extremities normal, atraumatic, no cyanosis or edema  ECOG PERFORMANCE STATUS: 1 - Symptomatic but completely ambulatory  Blood pressure (!) 150/48, pulse 70, temperature 97.9 F (36.6 C), temperature source Oral, resp. rate 20, height 5' 2"  (1.575 m), weight 153 lb (69.4 kg), SpO2 100 %.  LABORATORY DATA: Lab Results  Component Value Date   WBC 4.5 04/30/2017   HGB 10.7 (L) 04/30/2017   HCT 32.7 (L) 04/30/2017   MCV 88.5 04/30/2017   PLT 79 (L) 04/30/2017      Chemistry      Component Value Date/Time   NA 140 03/12/2017 1413   K 4.1 03/12/2017 1413   CL 99 (L) 12/26/2016 1032   CL 102 05/06/2013 1511   CO2 27 03/12/2017 1413   BUN 24.2 03/12/2017 1413   CREATININE 0.8 03/12/2017 1413      Component Value Date/Time   CALCIUM 9.3 03/12/2017 1413   ALKPHOS 76 03/12/2017 1413   AST 26 03/12/2017 1413   ALT 21 03/12/2017 1413   BILITOT 0.68 03/12/2017 1413       RADIOGRAPHIC STUDIES: No results found.  ASSESSMENT AND PLAN:  This is a very pleasant 71 years old Hispanic female with a stage IV non-small cell lung cancer, adenocarcinoma with positive EGFR mutation with deletion in exon 19 diagnosed in July 2013 status post 3 years treatment was Tarceva discontinued secondary to disease progression and development of T790M EGFR resistant mutation. She is currently on treatment with Tagrisso 80 mg by mouth daily status post 13 months of treatment and has been tolerating the treatment well. Her lab work today is unremarkable except for the mild anemia and thrombocytopenia. I recommended for the patient to continue her current treatment with Tagrisso. I will see her back for follow-up visit in one month's for evaluation after repeating CT scan  of the chest, abdomen and pelvis for restaging of her disease. She was advised to call immediately she has any concerning symptoms in the interval. The patient voices understanding of current disease status and treatment options and is in agreement with the current care plan. All questions were answered. The patient knows to call the clinic with any problems, questions or concerns. We can certainly see the patient much sooner if necessary. I spent 10 minutes counseling the patient face to face. The total time spent in the appointment was 15 minutes.  Disclaimer: This note was dictated with voice recognition software. Similar sounding words can inadvertently be transcribed and may not be corrected upon review.

## 2017-05-02 ENCOUNTER — Other Ambulatory Visit: Payer: Self-pay | Admitting: Internal Medicine

## 2017-05-02 DIAGNOSIS — Z1231 Encounter for screening mammogram for malignant neoplasm of breast: Secondary | ICD-10-CM

## 2017-05-18 ENCOUNTER — Ambulatory Visit
Admission: RE | Admit: 2017-05-18 | Discharge: 2017-05-18 | Disposition: A | Discharge status: Home / Self Care | Payer: Medicare Other | Source: Ambulatory Visit | Attending: Internal Medicine | Admitting: Internal Medicine

## 2017-05-18 DIAGNOSIS — Z1231 Encounter for screening mammogram for malignant neoplasm of breast: Secondary | ICD-10-CM | POA: Diagnosis not present

## 2017-05-28 ENCOUNTER — Ambulatory Visit (HOSPITAL_COMMUNITY)
Admission: RE | Admit: 2017-05-28 | Discharge: 2017-05-28 | Disposition: A | Discharge status: Home / Self Care | Payer: Medicare Other | Source: Ambulatory Visit | Attending: Internal Medicine | Admitting: Internal Medicine

## 2017-05-28 ENCOUNTER — Other Ambulatory Visit (HOSPITAL_BASED_OUTPATIENT_CLINIC_OR_DEPARTMENT_OTHER): Payer: Medicare Other

## 2017-05-28 DIAGNOSIS — C3432 Malignant neoplasm of lower lobe, left bronchus or lung: Secondary | ICD-10-CM | POA: Diagnosis present

## 2017-05-28 DIAGNOSIS — C7951 Secondary malignant neoplasm of bone: Secondary | ICD-10-CM | POA: Insufficient documentation

## 2017-05-28 DIAGNOSIS — C3492 Malignant neoplasm of unspecified part of left bronchus or lung: Secondary | ICD-10-CM | POA: Insufficient documentation

## 2017-05-28 DIAGNOSIS — J90 Pleural effusion, not elsewhere classified: Secondary | ICD-10-CM | POA: Insufficient documentation

## 2017-05-28 DIAGNOSIS — J479 Bronchiectasis, uncomplicated: Secondary | ICD-10-CM | POA: Diagnosis not present

## 2017-05-28 DIAGNOSIS — Z5111 Encounter for antineoplastic chemotherapy: Secondary | ICD-10-CM

## 2017-05-28 DIAGNOSIS — C799 Secondary malignant neoplasm of unspecified site: Secondary | ICD-10-CM

## 2017-05-28 DIAGNOSIS — I700 Atherosclerosis of aorta: Secondary | ICD-10-CM | POA: Insufficient documentation

## 2017-05-28 DIAGNOSIS — I2510 Atherosclerotic heart disease of native coronary artery without angina pectoris: Secondary | ICD-10-CM | POA: Diagnosis not present

## 2017-05-28 LAB — CBC WITH DIFFERENTIAL/PLATELET
BASO%: 0.2 % (ref 0.0–2.0)
BASOS ABS: 0 10*3/uL (ref 0.0–0.1)
EOS ABS: 0.1 10*3/uL (ref 0.0–0.5)
EOS%: 1.7 % (ref 0.0–7.0)
HEMATOCRIT: 34.3 % — AB (ref 34.8–46.6)
HGB: 11 g/dL — ABNORMAL LOW (ref 11.6–15.9)
LYMPH#: 0.9 10*3/uL (ref 0.9–3.3)
LYMPH%: 21.8 % (ref 14.0–49.7)
MCH: 28.8 pg (ref 25.1–34.0)
MCHC: 32.1 g/dL (ref 31.5–36.0)
MCV: 89.8 fL (ref 79.5–101.0)
MONO#: 0.4 10*3/uL (ref 0.1–0.9)
MONO%: 8.8 % (ref 0.0–14.0)
NEUT#: 2.8 10*3/uL (ref 1.5–6.5)
NEUT%: 67.5 % (ref 38.4–76.8)
PLATELETS: 88 10*3/uL — AB (ref 145–400)
RBC: 3.82 10*6/uL (ref 3.70–5.45)
RDW: 14.7 % — ABNORMAL HIGH (ref 11.2–14.5)
WBC: 4.1 10*3/uL (ref 3.9–10.3)

## 2017-05-28 LAB — COMPREHENSIVE METABOLIC PANEL
ALT: 32 U/L (ref 0–55)
ANION GAP: 10 meq/L (ref 3–11)
AST: 37 U/L — ABNORMAL HIGH (ref 5–34)
Albumin: 3.5 g/dL (ref 3.5–5.0)
Alkaline Phosphatase: 77 U/L (ref 40–150)
BILIRUBIN TOTAL: 0.59 mg/dL (ref 0.20–1.20)
BUN: 26.3 mg/dL — ABNORMAL HIGH (ref 7.0–26.0)
CALCIUM: 9.6 mg/dL (ref 8.4–10.4)
CHLORIDE: 104 meq/L (ref 98–109)
CO2: 25 mEq/L (ref 22–29)
CREATININE: 0.8 mg/dL (ref 0.6–1.1)
EGFR: 71 mL/min/{1.73_m2} — AB (ref >90)
Glucose: 81 mg/dl (ref 70–140)
Potassium: 4 mEq/L (ref 3.5–5.1)
Sodium: 139 mEq/L (ref 136–145)
Total Protein: 7.7 g/dL (ref 6.4–8.3)

## 2017-05-28 MED ORDER — IOPAMIDOL (ISOVUE-300) INJECTION 61%
INTRAVENOUS | Status: AC
  Filled 2017-05-28: qty 100

## 2017-05-28 MED ORDER — IOPAMIDOL (ISOVUE-300) INJECTION 61%
100.0000 mL | Freq: Once | INTRAVENOUS | Status: AC | PRN
  Administered 2017-05-28: 100 mL via INTRAVENOUS

## 2017-06-04 ENCOUNTER — Ambulatory Visit (HOSPITAL_BASED_OUTPATIENT_CLINIC_OR_DEPARTMENT_OTHER): Payer: Medicare Other | Admitting: Internal Medicine

## 2017-06-04 ENCOUNTER — Encounter: Payer: Self-pay | Admitting: Internal Medicine

## 2017-06-04 ENCOUNTER — Telehealth: Payer: Self-pay | Admitting: Internal Medicine

## 2017-06-04 VITALS — BP 147/45 | HR 76 | Temp 98.1°F | Resp 20 | Ht 62.0 in | Wt 155.0 lb

## 2017-06-04 DIAGNOSIS — C7951 Secondary malignant neoplasm of bone: Secondary | ICD-10-CM | POA: Diagnosis not present

## 2017-06-04 DIAGNOSIS — C799 Secondary malignant neoplasm of unspecified site: Secondary | ICD-10-CM

## 2017-06-04 DIAGNOSIS — C3492 Malignant neoplasm of unspecified part of left bronchus or lung: Secondary | ICD-10-CM

## 2017-06-04 DIAGNOSIS — C3432 Malignant neoplasm of lower lobe, left bronchus or lung: Secondary | ICD-10-CM | POA: Diagnosis not present

## 2017-06-04 DIAGNOSIS — D649 Anemia, unspecified: Secondary | ICD-10-CM

## 2017-06-04 DIAGNOSIS — D630 Anemia in neoplastic disease: Secondary | ICD-10-CM

## 2017-06-04 DIAGNOSIS — Z5111 Encounter for antineoplastic chemotherapy: Secondary | ICD-10-CM

## 2017-06-04 NOTE — Progress Notes (Signed)
[place] Telephone:(336) [phone]   Fax:(336) [phone]  OFFICE PROGRESS NOTE  System, Provider Not In No address on file  DIAGNOSIS: Metastatic non-small cell lung cancer, adenocarcinoma with positive EGFR mutation in exon 19 and development of resistant T790M mutation. This was initially diagnosed in July 2013.  PRIOR THERAPY: 1) Status post radiotherapy to the left face rib metastasis under the care of Dr. Lisbeth Renshaw.  2) stereotactic radiotherapy to the enlarging left lower lobe lung nodule under the care of Dr. Lisbeth Renshaw. 3) treatment with Tarceva 150 mg by mouth daily, therapy beginning 07/20/2012. Status post approximately 36 months of therapy. This was discontinued secondary to disease progression  CURRENT THERAPY: 1) Tagrisso 80 mg by mouth daily started 02/03/2016 status post more than 14 months of treatment. 2) Xgeva 120 mg subcutaneously every 4 weeks for bone metastasis.  INTERVAL HISTORY: Jacqueline Leonard 71 y.o. female returns to the clinic today for follow-up visit accompanied by her niece, Genelle Gather. The patient is feeling well today with no specific complaints. She denied having any fatigue or weakness. She denied having any chest pain, shortness of breath, cough or hemoptysis. She has no fever or chills. She has no significant nausea, vomiting, diarrhea or constipation. She denied having any weight loss or night sweats. She is tolerating her current treatment with Tagrisso fairly well. She had repeat CT scan of the chest, abdomen and pelvis performed recently and she is here for evaluation and discussion of her scan results and treatment options.  MEDICAL HISTORY: Past Medical History:  Diagnosis Date  . Allergy   . Anxiety   . Cancer, metastatic to bone ([facility]) 06/19/12   bx=L 5th ribmetastatic Adenocarcinoma with known lung mass  . Encounter for antineoplastic chemotherapy 02/14/2016  . External hemorrhoids   . Hyperlipidemia   . Hypertension   . Lung mass     . Radiation 07/11/12-07/24/12   Palliative lung tx 30 gray in 10 fx  . S/P radiation therapy 07/22/14-07/31/14   left lung/60gy/67f  . Status post chemoradiation    Tarceva  . Vitamin D deficiency     ALLERGIES:  is allergic to penicillins.  MEDICATIONS:  Current Outpatient Prescriptions  Medication Sig Dispense Refill  . acetaminophen (TYLENOL) 500 MG tablet Take 2 tablets (1,000 mg total) by mouth every 6 (six) hours as needed. (Patient not taking: Reported on 03/12/2017) 30 tablet 0  . amLODipine (NORVASC) 5 MG tablet Take 2.5 mg by mouth daily.     . cholecalciferol (VITAMIN D) 1000 UNITS tablet Take 1,000 Units by mouth daily.    . CYANOCOBALAMIN PO Take 1,000 mcg by mouth daily.    .Marland KitchenFeFum-FePoly-FA-B Cmp-C-Biot (INTEGRA PLUS) CAPS Take 1 capsule by mouth every morning. 30 capsule 2  . HYDROcodone-homatropine (HYCODAN) 5-1.5 MG/5ML syrup Take 5 mLs by mouth every 6 (six) hours as needed for cough. (Patient not taking: Reported on 12/26/2016) 120 mL 0  . ondansetron (ZOFRAN ODT) 4 MG disintegrating tablet Take 1 tablet (4 mg total) by mouth every 4 (four) hours as needed for nausea or vomiting. (Patient not taking: Reported on 02/05/2017) 20 tablet 0  . osimertinib mesylate (TAGRISSO) 80 MG tablet Take 1 tablet (80 mg total) by mouth daily. 90 tablet 0  . potassium chloride (K-DUR) 10 MEQ tablet Take 1 tablet by mouth daily.     No current facility-administered medications for this visit.     SURGICAL HISTORY:  Past Surgical History:  Procedure Laterality Date  . APPENDECTOMY    .  OVARIAN CYST REMOVAL    . SOFT TISSUE BIOPSY  06/19/12   L 5th rib=metastatic adenocarcinoma    REVIEW OF SYSTEMS:  Constitutional: negative Eyes: negative Ears, nose, mouth, throat, and face: negative Respiratory: negative Cardiovascular: negative Gastrointestinal: negative Genitourinary:negative Integument/breast: negative Hematologic/lymphatic: negative Musculoskeletal:negative Neurological:  negative Behavioral/Psych: negative Endocrine: negative Allergic/Immunologic: negative   PHYSICAL EXAMINATION: General appearance: alert, cooperative and no distress Head: Normocephalic, without obvious abnormality, atraumatic Neck: no adenopathy, no JVD, supple, symmetrical, trachea midline and thyroid not enlarged, symmetric, no tenderness/mass/nodules Lymph nodes: Cervical, supraclavicular, and axillary nodes normal. Resp: clear to auscultation bilaterally Back: symmetric, no curvature. ROM normal. No CVA tenderness. Cardio: regular rate and rhythm, S1, S2 normal, no murmur, click, rub or gallop GI: soft, non-tender; bowel sounds normal; no masses,  no organomegaly Extremities: extremities normal, atraumatic, no cyanosis or edema Neurologic: Alert and oriented X 3, normal strength and tone. Normal symmetric reflexes. Normal coordination and gait  ECOG PERFORMANCE STATUS: 1 - Symptomatic but completely ambulatory  Blood pressure (!) 147/45, pulse 76, temperature 98.1 F (36.7 C), temperature source Oral, resp. rate 20, height _0  (1.575 m), weight 155 lb (70.3 kg), SpO2 100 %.  LABORATORY DATA: Lab Results  Component Value Date   WBC 4.1 05/28/2017   HGB 11.0 (L) 05/28/2017   HCT 34.3 (L) 05/28/2017   MCV 89.8 05/28/2017   PLT 88 (L) 05/28/2017      Chemistry      Component Value Date/Time   NA 139 05/28/2017 1515   K 4.0 05/28/2017 1515   CL 99 (L) 12/26/2016 1032   CL 102 05/06/2013 1511   CO2 25 05/28/2017 1515   BUN 26.3 (H) 05/28/2017 1515   CREATININE 0.8 05/28/2017 1515      Component Value Date/Time   CALCIUM 9.6 05/28/2017 1515   ALKPHOS 77 05/28/2017 1515   AST 37 (H) 05/28/2017 1515   ALT 32 05/28/2017 1515   BILITOT 0.59 05/28/2017 1515       RADIOGRAPHIC STUDIES: Ct Chest W Contrast  Result Date: 05/28/2017 CLINICAL DATA:  Non-small cell lung cancer. EXAM: CT CHEST, ABDOMEN, AND PELVIS WITH CONTRAST TECHNIQUE: Multidetector CT imaging of the  chest, abdomen and pelvis was performed following the standard protocol during bolus administration of intravenous contrast. CONTRAST:  160m ISOVUE-300 IOPAMIDOL (ISOVUE-300) INJECTION 61% COMPARISON:  01/29/2017. FINDINGS: CT CHEST FINDINGS Cardiovascular: Atherosclerotic calcification of the arterial vasculature. Heart is at the upper limits of normal in size to mildly enlarged. No pericardial effusion. Mediastinum/Nodes: No pathologically enlarged mediastinal, hilar or axillary lymph nodes. Esophagus is grossly unremarkable. Lungs/Pleura: Perilymphatic ground-glass, nodularity and septal thickening bilaterally, similar. Central nodular soft tissue consolidation with surrounding volume loss and bronchiectasis in the left lower lobe, as before. Small loculated left pleural effusion. Musculoskeletal: An expansile lesion in the lateral left fourth rib is unchanged. Irregular sclerosis in the T3 vertebral body, unchanged. CT ABDOMEN PELVIS FINDINGS Hepatobiliary: Liver and gallbladder are unremarkable. No biliary ductal dilatation. Pancreas: Negative. Spleen: Negative. Adrenals/Urinary Tract: Adrenal glands and right kidney are unremarkable. Low-attenuation lesions in the left kidney measure up to 3.4 cm, stable and likely cysts. Ureters are decompressed. Bladder is low in volume. Stomach/Bowel: Stomach, small bowel and colon are unremarkable. Appendix is not well-visualized. Vascular/Lymphatic: Atherosclerotic calcification of the arterial vasculature without abdominal aortic aneurysm. Mild haziness in the gastrohepatic ligament and celiac trunk region, similar. No pathologically enlarged lymph nodes. Reproductive: Calcified lesion in the uterus measures 2.2 cm, indicative of a fibroid. No adnexal mass.  Other: No free fluid.  Mesenteries and peritoneum are unremarkable. Musculoskeletal: Bilateral L5 pars defects with near grade 2 anterolisthesis, as before. IMPRESSION: 1. Left perihilar scarring with an associated  small left pleural effusion and extensive bilateral perilymphatic fibrotic changes, most consistent with post treatment changes. 2. Stable osseous metastatic disease. 3.  Aortic atherosclerosis (ICD10-170.0). Electronically Signed   By: Lorin Picket M.D.   On: 05/28/2017 15:54   Ct Abdomen Pelvis W Contrast  Result Date: 05/28/2017 CLINICAL DATA:  Non-small cell lung cancer. EXAM: CT CHEST, ABDOMEN, AND PELVIS WITH CONTRAST TECHNIQUE: Multidetector CT imaging of the chest, abdomen and pelvis was performed following the standard protocol during bolus administration of intravenous contrast. CONTRAST:  14m ISOVUE-300 IOPAMIDOL (ISOVUE-300) INJECTION 61% COMPARISON:  01/29/2017. FINDINGS: CT CHEST FINDINGS Cardiovascular: Atherosclerotic calcification of the arterial vasculature. Heart is at the upper limits of normal in size to mildly enlarged. No pericardial effusion. Mediastinum/Nodes: No pathologically enlarged mediastinal, hilar or axillary lymph nodes. Esophagus is grossly unremarkable. Lungs/Pleura: Perilymphatic ground-glass, nodularity and septal thickening bilaterally, similar. Central nodular soft tissue consolidation with surrounding volume loss and bronchiectasis in the left lower lobe, as before. Small loculated left pleural effusion. Musculoskeletal: An expansile lesion in the lateral left fourth rib is unchanged. Irregular sclerosis in the T3 vertebral body, unchanged. CT ABDOMEN PELVIS FINDINGS Hepatobiliary: Liver and gallbladder are unremarkable. No biliary ductal dilatation. Pancreas: Negative. Spleen: Negative. Adrenals/Urinary Tract: Adrenal glands and right kidney are unremarkable. Low-attenuation lesions in the left kidney measure up to 3.4 cm, stable and likely cysts. Ureters are decompressed. Bladder is low in volume. Stomach/Bowel: Stomach, small bowel and colon are unremarkable. Appendix is not well-visualized. Vascular/Lymphatic: Atherosclerotic calcification of the arterial  vasculature without abdominal aortic aneurysm. Mild haziness in the gastrohepatic ligament and celiac trunk region, similar. No pathologically enlarged lymph nodes. Reproductive: Calcified lesion in the uterus measures 2.2 cm, indicative of a fibroid. No adnexal mass. Other: No free fluid.  Mesenteries and peritoneum are unremarkable. Musculoskeletal: Bilateral L5 pars defects with near grade 2 anterolisthesis, as before. IMPRESSION: 1. Left perihilar scarring with an associated small left pleural effusion and extensive bilateral perilymphatic fibrotic changes, most consistent with post treatment changes. 2. Stable osseous metastatic disease. 3.  Aortic atherosclerosis (ICD10-170.0). Electronically Signed   By: MLorin PicketM.D.   On: 05/28/2017 15:54   Mm Screening Breast Tomo Bilateral  Result Date: 05/18/2017 CLINICAL DATA:  Screening. EXAM: 2D DIGITAL SCREENING BILATERAL MAMMOGRAM WITH CAD AND ADJUNCT TOMO COMPARISON:  Previous exam(s). ACR Breast Density Category c: The breast tissue is heterogeneously dense, which may obscure small masses. FINDINGS: There are no findings suspicious for malignancy. Images were processed with CAD. IMPRESSION: No mammographic evidence of malignancy. A result letter of this screening mammogram will be mailed directly to the patient. RECOMMENDATION: Screening mammogram in one year. (Code:SM-B-01Y) BI-RADS CATEGORY  1: Negative. Electronically Signed   By: ENolon NationsM.D.   On: 05/18/2017 12:53    ASSESSMENT AND PLAN:  This is a very pleasant 71years old Hispanic female with a stage IV non-small cell lung cancer, adenocarcinoma with positive EGFR mutation with deletion in exon 19 diagnosed in July 2013 status post 3 years treatment with Tarceva discontinued secondary to disease progression and development of T7 958M EGFR resistant mutation. The patient is currently on treatment with Tagrisso 80 mg by mouth daily status post 14 months of treatment and has been  tolerating this treatment well with no significant adverse effects. She had CT scan  of the chest, abdomen and pelvis performed recently. I personally and independently reviewed the scans and discussed the results and give the patient a copy of her report. Her scan showed no evidence for disease progression. I recommended for the patient to continue her current treatment with Tagrisso with the same dose. I will see her back for follow-up visit in one month for reevaluation with repeat blood work. For her anemia, she will continue on the Integra plus by mouth daily. The patient was advised to call immediately if she has any concerning symptoms in the interval. The patient voices understanding of current disease status and treatment options and is in agreement with the current care plan. All questions were answered. The patient knows to call the clinic with any problems, questions or concerns. We can certainly see the patient much sooner if necessary.  Disclaimer: This note was dictated with voice recognition software. Similar sounding words can inadvertently be transcribed and may not be corrected upon review.

## 2017-06-04 NOTE — Telephone Encounter (Signed)
Scheduled appt per 7/9 los - Gave patient AVS and calender per los.

## 2017-06-06 ENCOUNTER — Other Ambulatory Visit: Payer: Self-pay | Admitting: Medical Oncology

## 2017-06-06 DIAGNOSIS — C3490 Malignant neoplasm of unspecified part of unspecified bronchus or lung: Secondary | ICD-10-CM

## 2017-06-06 MED ORDER — OSIMERTINIB MESYLATE 80 MG PO TABS: 80.0000 mg | ORAL_TABLET | Freq: Every day | ORAL | 0 refills | Status: DC

## 2017-07-04 ENCOUNTER — Encounter: Payer: Self-pay | Admitting: Internal Medicine

## 2017-07-04 ENCOUNTER — Ambulatory Visit (HOSPITAL_BASED_OUTPATIENT_CLINIC_OR_DEPARTMENT_OTHER): Payer: Medicare Other | Admitting: Internal Medicine

## 2017-07-04 ENCOUNTER — Other Ambulatory Visit: Payer: Medicare Other

## 2017-07-04 DIAGNOSIS — C799 Secondary malignant neoplasm of unspecified site: Secondary | ICD-10-CM

## 2017-07-04 DIAGNOSIS — C3490 Malignant neoplasm of unspecified part of unspecified bronchus or lung: Secondary | ICD-10-CM

## 2017-07-04 DIAGNOSIS — C7951 Secondary malignant neoplasm of bone: Secondary | ICD-10-CM

## 2017-07-04 DIAGNOSIS — C3492 Malignant neoplasm of unspecified part of left bronchus or lung: Secondary | ICD-10-CM

## 2017-07-04 DIAGNOSIS — Z5111 Encounter for antineoplastic chemotherapy: Secondary | ICD-10-CM

## 2017-07-04 DIAGNOSIS — C7952 Secondary malignant neoplasm of bone marrow: Secondary | ICD-10-CM

## 2017-07-04 LAB — CBC WITH DIFFERENTIAL/PLATELET
BASO%: 0.3 % (ref 0.0–2.0)
BASOS ABS: 0 10*3/uL (ref 0.0–0.1)
EOS ABS: 0.1 10*3/uL (ref 0.0–0.5)
EOS%: 2 % (ref 0.0–7.0)
HEMATOCRIT: 33.7 % — AB (ref 34.8–46.6)
HGB: 10.9 g/dL — ABNORMAL LOW (ref 11.6–15.9)
LYMPH%: 18.2 % (ref 14.0–49.7)
MCH: 28.9 pg (ref 25.1–34.0)
MCHC: 32.4 g/dL (ref 31.5–36.0)
MCV: 89 fL (ref 79.5–101.0)
MONO#: 0.4 10*3/uL (ref 0.1–0.9)
MONO%: 9.1 % (ref 0.0–14.0)
NEUT#: 3.4 10*3/uL (ref 1.5–6.5)
NEUT%: 70.4 % (ref 38.4–76.8)
PLATELETS: 78 10*3/uL — AB (ref 145–400)
RBC: 3.79 10*6/uL (ref 3.70–5.45)
RDW: 15.2 % — ABNORMAL HIGH (ref 11.2–14.5)
WBC: 4.9 10*3/uL (ref 3.9–10.3)
lymph#: 0.9 10*3/uL (ref 0.9–3.3)

## 2017-07-04 LAB — COMPREHENSIVE METABOLIC PANEL
ALBUMIN: 3.3 g/dL — AB (ref 3.5–5.0)
ALK PHOS: 76 U/L (ref 40–150)
ALT: 28 U/L (ref 0–55)
AST: 34 U/L (ref 5–34)
Anion Gap: 7 mEq/L (ref 3–11)
BILIRUBIN TOTAL: 0.65 mg/dL (ref 0.20–1.20)
BUN: 24.9 mg/dL (ref 7.0–26.0)
CO2: 29 meq/L (ref 22–29)
Calcium: 9.5 mg/dL (ref 8.4–10.4)
Chloride: 105 mEq/L (ref 98–109)
Creatinine: 0.9 mg/dL (ref 0.6–1.1)
EGFR: 64 mL/min/{1.73_m2} — ABNORMAL LOW (ref >90)
GLUCOSE: 94 mg/dL (ref 70–140)
POTASSIUM: 4.3 meq/L (ref 3.5–5.1)
Sodium: 141 mEq/L (ref 136–145)
Total Protein: 7.6 g/dL (ref 6.4–8.3)

## 2017-07-04 NOTE — Progress Notes (Signed)
[place] [phone]) [phone]   [phone]) [phone]  OFFICE PROGRESS NOTE  System, Provider Not In No address on file  DIAGNOSIS: Metastatic non-small cell lung cancer, adenocarcinoma with positive EGFR mutation in exon 19 and development of resistant T790M mutation. This was initially diagnosed in July 2013.  PRIOR THERAPY: 1) Status post radiotherapy to the left face rib metastasis under the care of Dr. Lisbeth Renshaw.  2) stereotactic radiotherapy to the enlarging left lower lobe lung nodule under the care of Dr. Lisbeth Renshaw. 3) treatment with Tarceva 150 mg by mouth daily, therapy beginning 07/20/2012. Status post approximately 36 months of therapy. This was discontinued secondary to disease progression  CURRENT THERAPY: 1) Tagrisso 80 mg by mouth daily started 02/03/2016 status post more than 15 months of treatment. 2) Xgeva 120 mg subcutaneously every 4 weeks for bone metastasis.  INTERVAL HISTORY: Jacqueline Leonard 71 y.o. female returns to the clinic today for follow-up visit. The patient is feeling fine today with no specific complaints. She is currently on treatment with Tagrisso 80 mg by mouth daily and has been tolerating this treatment fairly well. She denied having any significant skin rash or diarrhea. She has no chest pain, shortness of breath, cough or hemoptysis. She has no fever or chills. She is here today for evaluation and repeat blood work.  MEDICAL HISTORY: Past Medical History:  Diagnosis Date  . Allergy   . Anxiety   . Cancer, metastatic to bone ([place]) 06/19/12   bx=L 5th ribmetastatic Adenocarcinoma with known lung mass  . Encounter for antineoplastic chemotherapy 02/14/2016  . External hemorrhoids   . Hyperlipidemia   . Hypertension   . Lung mass   . Radiation 07/11/12-07/24/12   Palliative lung tx 30 gray in 10 fx  . S/P radiation therapy 07/22/14-07/31/14   left lung/60gy/34f  . Status post chemoradiation    Tarceva  . Vitamin D deficiency      ALLERGIES:  is allergic to penicillins.  MEDICATIONS:  Current Outpatient Prescriptions  Medication Sig Dispense Refill  . acetaminophen (TYLENOL) 500 MG tablet Take 2 tablets (1,000 mg total) by mouth every 6 (six) hours as needed. (Patient not taking: Reported on 03/12/2017) 30 tablet 0  . amLODipine (NORVASC) 5 MG tablet Take 2.5 mg by mouth daily.     . cholecalciferol (VITAMIN D) 1000 UNITS tablet Take 1,000 Units by mouth daily.    . CYANOCOBALAMIN PO Take 1,000 mcg by mouth daily.    .Marland KitchenFeFum-FePoly-FA-B Cmp-C-Biot (INTEGRA PLUS) CAPS Take 1 capsule by mouth every morning. 30 capsule 2  . HYDROcodone-homatropine (HYCODAN) 5-1.5 MG/5ML syrup Take 5 mLs by mouth every 6 (six) hours as needed for cough. (Patient not taking: Reported on 12/26/2016) 120 mL 0  . ondansetron (ZOFRAN ODT) 4 MG disintegrating tablet Take 1 tablet (4 mg total) by mouth every 4 (four) hours as needed for nausea or vomiting. (Patient not taking: Reported on 02/05/2017) 20 tablet 0  . osimertinib mesylate (TAGRISSO) 80 MG tablet Take 1 tablet (80 mg total) by mouth daily. 90 tablet 0  . potassium chloride (K-DUR) 10 MEQ tablet Take 1 tablet by mouth daily.     No current facility-administered medications for this visit.     SURGICAL HISTORY:  Past Surgical History:  Procedure Laterality Date  . APPENDECTOMY    . OVARIAN CYST REMOVAL    . SOFT TISSUE BIOPSY  06/19/12   L 5th rib=metastatic adenocarcinoma    REVIEW OF SYSTEMS:  A comprehensive  review of systems was negative.   PHYSICAL EXAMINATION: General appearance: alert, cooperative and no distress Head: Normocephalic, without obvious abnormality, atraumatic Neck: no adenopathy, no JVD, supple, symmetrical, trachea midline and thyroid not enlarged, symmetric, no tenderness/mass/nodules Lymph nodes: Cervical, supraclavicular, and axillary nodes normal. Resp: clear to auscultation bilaterally Back: symmetric, no curvature. ROM normal. No CVA  tenderness. Cardio: regular rate and rhythm, S1, S2 normal, no murmur, click, rub or gallop GI: soft, non-tender; bowel sounds normal; no masses,  no organomegaly Extremities: extremities normal, atraumatic, no cyanosis or edema  ECOG PERFORMANCE STATUS: 1 - Symptomatic but completely ambulatory  There were no vitals taken for this visit.  LABORATORY DATA: Lab Results  Component Value Date   WBC 4.9 07/04/2017   HGB 10.9 (L) 07/04/2017   HCT 33.7 (L) 07/04/2017   MCV 89.0 07/04/2017   PLT 78 (L) 07/04/2017      Chemistry      Component Value Date/Time   NA 141 07/04/2017 1531   K 4.3 07/04/2017 1531   CL 99 (L) 12/26/2016 1032   CL 102 05/06/2013 1511   CO2 29 07/04/2017 1531   BUN 24.9 07/04/2017 1531   CREATININE 0.9 07/04/2017 1531      Component Value Date/Time   CALCIUM 9.5 07/04/2017 1531   ALKPHOS 76 07/04/2017 1531   AST 34 07/04/2017 1531   ALT 28 07/04/2017 1531   BILITOT 0.65 07/04/2017 1531       RADIOGRAPHIC STUDIES: No results found.  ASSESSMENT AND PLAN:  This is a very pleasant 71 years old Hispanic female with a stage IV non-small cell lung cancer, adenocarcinoma with positive EGFR mutation with deletion in exon 19 diagnosed in July 2013 status post 3 years treatment with Tarceva discontinued secondary to disease progression and development of T7 66 M EGFR resistant mutation. The patient is currently on treatment with Tagrisso 80 mg by mouth daily status post 15 months of treatment and has been tolerating this treatment well with no significant adverse effects. The patient is feeling well today with no concerning complaints. Her CBC is a stable and continues to have persistent mild anemia and thrombocytopenia. I recommended for the patient to continue her current treatment with Tagrisso with the same dose. I would see her back for follow-up visit in one month's for reevaluation with repeat blood work. She was advised to call immediately if she has  any concerning symptoms in the interval. All questions were answered. The patient knows to call the clinic with any problems, questions or concerns. We can certainly see the patient much sooner if necessary.  Disclaimer: This note was dictated with voice recognition software. Similar sounding words can inadvertently be transcribed and may not be corrected upon review.

## 2017-07-09 ENCOUNTER — Telehealth: Payer: Self-pay | Admitting: Internal Medicine

## 2017-07-09 NOTE — Telephone Encounter (Signed)
Called patient regarding appointment left a message

## 2017-07-26 ENCOUNTER — Other Ambulatory Visit: Payer: Self-pay | Admitting: Pharmacist

## 2017-08-06 ENCOUNTER — Telehealth: Payer: Self-pay | Admitting: Internal Medicine

## 2017-08-06 ENCOUNTER — Ambulatory Visit (HOSPITAL_BASED_OUTPATIENT_CLINIC_OR_DEPARTMENT_OTHER): Payer: Medicare Other | Admitting: Internal Medicine

## 2017-08-06 ENCOUNTER — Encounter: Payer: Self-pay | Admitting: Internal Medicine

## 2017-08-06 ENCOUNTER — Other Ambulatory Visit (HOSPITAL_BASED_OUTPATIENT_CLINIC_OR_DEPARTMENT_OTHER): Payer: Medicare Other

## 2017-08-06 VITALS — BP 147/51 | HR 70 | Temp 98.3°F | Resp 18 | Ht 62.0 in | Wt 155.9 lb

## 2017-08-06 DIAGNOSIS — C3490 Malignant neoplasm of unspecified part of unspecified bronchus or lung: Secondary | ICD-10-CM

## 2017-08-06 DIAGNOSIS — Z5111 Encounter for antineoplastic chemotherapy: Secondary | ICD-10-CM

## 2017-08-06 DIAGNOSIS — C3492 Malignant neoplasm of unspecified part of left bronchus or lung: Secondary | ICD-10-CM

## 2017-08-06 DIAGNOSIS — C799 Secondary malignant neoplasm of unspecified site: Secondary | ICD-10-CM

## 2017-08-06 DIAGNOSIS — C7951 Secondary malignant neoplasm of bone: Secondary | ICD-10-CM | POA: Diagnosis not present

## 2017-08-06 DIAGNOSIS — C7952 Secondary malignant neoplasm of bone marrow: Secondary | ICD-10-CM

## 2017-08-06 LAB — COMPREHENSIVE METABOLIC PANEL
ALK PHOS: 78 U/L (ref 40–150)
ALT: 25 U/L (ref 0–55)
AST: 33 U/L (ref 5–34)
Albumin: 3.3 g/dL — ABNORMAL LOW (ref 3.5–5.0)
Anion Gap: 6 mEq/L (ref 3–11)
BUN: 19.2 mg/dL (ref 7.0–26.0)
CHLORIDE: 107 meq/L (ref 98–109)
CO2: 28 meq/L (ref 22–29)
Calcium: 9.5 mg/dL (ref 8.4–10.4)
Creatinine: 0.9 mg/dL (ref 0.6–1.1)
EGFR: 62 mL/min/{1.73_m2} — ABNORMAL LOW (ref >90)
GLUCOSE: 119 mg/dL (ref 70–140)
Potassium: 4.1 mEq/L (ref 3.5–5.1)
SODIUM: 141 meq/L (ref 136–145)
TOTAL PROTEIN: 7.7 g/dL (ref 6.4–8.3)
Total Bilirubin: 0.46 mg/dL (ref 0.20–1.20)

## 2017-08-06 LAB — CBC WITH DIFFERENTIAL/PLATELET
BASO%: 0.2 % (ref 0.0–2.0)
Basophils Absolute: 0 10*3/uL (ref 0.0–0.1)
EOS%: 2.6 % (ref 0.0–7.0)
Eosinophils Absolute: 0.1 10*3/uL (ref 0.0–0.5)
HCT: 33.7 % — ABNORMAL LOW (ref 34.8–46.6)
HGB: 11 g/dL — ABNORMAL LOW (ref 11.6–15.9)
LYMPH%: 19.2 % (ref 14.0–49.7)
MCH: 28.8 pg (ref 25.1–34.0)
MCHC: 32.5 g/dL (ref 31.5–36.0)
MCV: 88.5 fL (ref 79.5–101.0)
MONO#: 0.3 10*3/uL (ref 0.1–0.9)
MONO%: 9.4 % (ref 0.0–14.0)
NEUT%: 68.6 % (ref 38.4–76.8)
NEUTROS ABS: 2.5 10*3/uL (ref 1.5–6.5)
Platelets: 75 10*3/uL — ABNORMAL LOW (ref 145–400)
RBC: 3.81 10*6/uL (ref 3.70–5.45)
RDW: 15.1 % — ABNORMAL HIGH (ref 11.2–14.5)
WBC: 3.6 10*3/uL — AB (ref 3.9–10.3)
lymph#: 0.7 10*3/uL — ABNORMAL LOW (ref 0.9–3.3)

## 2017-08-06 NOTE — Progress Notes (Signed)
[place] [phone]   Fax:([phone]  OFFICE PROGRESS NOTE  System, Provider Not In No address on file  DIAGNOSIS: Metastatic non-small cell lung cancer, adenocarcinoma with positive EGFR mutation in exon 19 and development of resistant T790M mutation. This was initially diagnosed in July 2013.  PRIOR THERAPY: 1) Status post radiotherapy to the left face rib metastasis under the care of Dr. Lisbeth Renshaw.  2) stereotactic radiotherapy to the enlarging left lower lobe lung nodule under the care of Dr. Lisbeth Renshaw. 3) treatment with Tarceva 150 mg by mouth daily, therapy beginning 07/20/2012. Status post approximately 36 months of therapy. This was discontinued secondary to disease progression  CURRENT THERAPY: 1) Tagrisso 80 mg by mouth daily started 02/03/2016 status post more than 16 months of treatment. 2) Xgeva 120 mg subcutaneously every 4 weeks for bone metastasis.  INTERVAL HISTORY: Jacqueline Leonard 71 y.o. female returns to the clinic today for follow-up visit. The patient is feeling fine today with no specific complaints. She continues to tolerate her current treatment with Tagrisso fairly well. She denied having any chest pain, shortness of breath, cough or hemoptysis. She denied having any fever or chills. She has no nausea, vomiting, diarrhea or constipation. She has no significant skin rash. She is here today for evaluation and repeat blood work.  MEDICAL HISTORY: Past Medical History:  Diagnosis Date  . Allergy   . Anxiety   . Cancer, metastatic to bone ([place]) 06/19/12   bx=L 5th ribmetastatic Adenocarcinoma with known lung mass  . Encounter for antineoplastic chemotherapy 02/14/2016  . External hemorrhoids   . Hyperlipidemia   . Hypertension   . Lung mass   . Radiation 07/11/12-07/24/12   Palliative lung tx 30 gray in 10 fx  . S/P radiation therapy 07/22/14-07/31/14   left lung/60gy/9f  . Status post chemoradiation    Tarceva  . Vitamin D  deficiency     ALLERGIES:  is allergic to penicillins.  MEDICATIONS:  Current Outpatient Prescriptions  Medication Sig Dispense Refill  . amLODipine (NORVASC) 5 MG tablet Take 2.5 mg by mouth daily.     . cholecalciferol (VITAMIN D) 1000 UNITS tablet Take 1,000 Units by mouth daily.    . CYANOCOBALAMIN PO Take 1,000 mcg by mouth daily.    .Marland KitchenFeFum-FePoly-FA-B Cmp-C-Biot (INTEGRA PLUS) CAPS Take 1 capsule by mouth every morning. 30 capsule 2  . osimertinib mesylate (TAGRISSO) 80 MG tablet Take 1 tablet (80 mg total) by mouth daily. 90 tablet 0  . potassium chloride (K-DUR) 10 MEQ tablet Take 1 tablet by mouth daily.     No current facility-administered medications for this visit.     SURGICAL HISTORY:  Past Surgical History:  Procedure Laterality Date  . APPENDECTOMY    . OVARIAN CYST REMOVAL    . SOFT TISSUE BIOPSY  06/19/12   L 5th rib=metastatic adenocarcinoma    REVIEW OF SYSTEMS:  A comprehensive review of systems was negative.   PHYSICAL EXAMINATION: General appearance: alert, cooperative and no distress Head: Normocephalic, without obvious abnormality, atraumatic Neck: no adenopathy, no JVD, supple, symmetrical, trachea midline and thyroid not enlarged, symmetric, no tenderness/mass/nodules Lymph nodes: Cervical, supraclavicular, and axillary nodes normal. Resp: clear to auscultation bilaterally Back: symmetric, no curvature. ROM normal. No CVA tenderness. Cardio: regular rate and rhythm, S1, S2 normal, no murmur, click, rub or gallop GI: soft, non-tender; bowel sounds normal; no masses,  no organomegaly Extremities: extremities normal, atraumatic, no cyanosis or edema  ECOG PERFORMANCE STATUS:  1 - Symptomatic but completely ambulatory  Blood pressure (!) 147/51, pulse 70, temperature 98.3 F (36.8 C), temperature source Oral, resp. rate 18, height _0  (1.575 m), weight 155 lb 14.4 oz (70.7 kg), SpO2 100 %.  LABORATORY DATA: Lab Results  Component Value Date   WBC  3.6 (L) 08/06/2017   HGB 11.0 (L) 08/06/2017   HCT 33.7 (L) 08/06/2017   MCV 88.5 08/06/2017   PLT 75 (L) 08/06/2017      Chemistry      Component Value Date/Time   NA 141 07/04/2017 1531   K 4.3 07/04/2017 1531   CL 99 (L) 12/26/2016 1032   CL 102 05/06/2013 1511   CO2 29 07/04/2017 1531   BUN 24.9 07/04/2017 1531   CREATININE 0.9 07/04/2017 1531      Component Value Date/Time   CALCIUM 9.5 07/04/2017 1531   ALKPHOS 76 07/04/2017 1531   AST 34 07/04/2017 1531   ALT 28 07/04/2017 1531   BILITOT 0.65 07/04/2017 1531       RADIOGRAPHIC STUDIES: No results found.  ASSESSMENT AND PLAN:  This is a very pleasant 71 years old Hispanic female with a stage IV non-small cell lung cancer, adenocarcinoma with positive EGFR mutation with deletion in exon 19 diagnosed in July 2013 status post 3 years treatment with Tarceva discontinued secondary to disease progression and development of T7 60 M EGFR resistant mutation. The patient was started on treatment with Tagrisso 80 mg by mouth daily status post 16 months.  She continues to tolerate her treatment well. I recommended for the patient to continue her current treatment with Tagrisso and I will see her back for follow-up visit in one month's for evaluation after repeating CT scan of the chest, abdomen and pelvis for restaging of her disease. She was advised to call immediately if she has any concerning symptoms in the interval. All questions were answered. The patient knows to call the clinic with any problems, questions or concerns. We can certainly see the patient much sooner if necessary.  Disclaimer: This note was dictated with voice recognition software. Similar sounding words can inadvertently be transcribed and may not be corrected upon review.

## 2017-08-06 NOTE — Telephone Encounter (Signed)
Gave patient avs report and appointments for October. Central radiology will call re scan.

## 2017-08-27 ENCOUNTER — Other Ambulatory Visit: Payer: Self-pay | Admitting: Pharmacist

## 2017-08-27 ENCOUNTER — Other Ambulatory Visit: Payer: Self-pay | Admitting: *Deleted

## 2017-08-27 DIAGNOSIS — C3490 Malignant neoplasm of unspecified part of unspecified bronchus or lung: Secondary | ICD-10-CM

## 2017-08-27 MED ORDER — OSIMERTINIB MESYLATE 80 MG PO TABS: 80.0000 mg | ORAL_TABLET | Freq: Every day | ORAL | 0 refills | Status: DC

## 2017-08-27 NOTE — Telephone Encounter (Signed)
Oral Oncology Pharmacist Encounter  New prescription for Tagrisso 80mg  tablets faxed to [facility] manufacturer assistance program at [phone].  Oral Oncology Clinic will continue to follow.  Johny Drilling, PharmD, BCPS, BCOP 08/27/2017 1:34 PM Oral Oncology Clinic [phone]

## 2017-08-27 NOTE — Telephone Encounter (Signed)
Jacqueline Leonard in pharmacy request paper script on [place] for pt assistance.

## 2017-09-03 ENCOUNTER — Encounter (HOSPITAL_COMMUNITY): Payer: Self-pay

## 2017-09-03 ENCOUNTER — Other Ambulatory Visit (HOSPITAL_BASED_OUTPATIENT_CLINIC_OR_DEPARTMENT_OTHER): Payer: Medicare Other

## 2017-09-03 ENCOUNTER — Ambulatory Visit (HOSPITAL_COMMUNITY)
Admission: RE | Admit: 2017-09-03 | Discharge: 2017-09-03 | Disposition: A | Discharge status: Home / Self Care | Payer: Medicare Other | Source: Ambulatory Visit | Attending: Internal Medicine | Admitting: Internal Medicine

## 2017-09-03 DIAGNOSIS — C7951 Secondary malignant neoplasm of bone: Secondary | ICD-10-CM | POA: Diagnosis not present

## 2017-09-03 DIAGNOSIS — J90 Pleural effusion, not elsewhere classified: Secondary | ICD-10-CM | POA: Diagnosis not present

## 2017-09-03 DIAGNOSIS — C7952 Secondary malignant neoplasm of bone marrow: Secondary | ICD-10-CM

## 2017-09-03 DIAGNOSIS — I700 Atherosclerosis of aorta: Secondary | ICD-10-CM | POA: Diagnosis not present

## 2017-09-03 DIAGNOSIS — I517 Cardiomegaly: Secondary | ICD-10-CM | POA: Insufficient documentation

## 2017-09-03 DIAGNOSIS — Z5111 Encounter for antineoplastic chemotherapy: Secondary | ICD-10-CM

## 2017-09-03 DIAGNOSIS — K829 Disease of gallbladder, unspecified: Secondary | ICD-10-CM | POA: Diagnosis not present

## 2017-09-03 DIAGNOSIS — C3490 Malignant neoplasm of unspecified part of unspecified bronchus or lung: Secondary | ICD-10-CM | POA: Diagnosis present

## 2017-09-03 DIAGNOSIS — C799 Secondary malignant neoplasm of unspecified site: Secondary | ICD-10-CM

## 2017-09-03 DIAGNOSIS — D259 Leiomyoma of uterus, unspecified: Secondary | ICD-10-CM | POA: Insufficient documentation

## 2017-09-03 DIAGNOSIS — C801 Malignant (primary) neoplasm, unspecified: Secondary | ICD-10-CM | POA: Diagnosis not present

## 2017-09-03 LAB — COMPREHENSIVE METABOLIC PANEL
ALBUMIN: 3.4 g/dL — AB (ref 3.5–5.0)
ALT: 21 U/L (ref 0–55)
AST: 28 U/L (ref 5–34)
Alkaline Phosphatase: 72 U/L (ref 40–150)
Anion Gap: 6 mEq/L (ref 3–11)
BILIRUBIN TOTAL: 0.46 mg/dL (ref 0.20–1.20)
BUN: 22.7 mg/dL (ref 7.0–26.0)
CO2: 27 meq/L (ref 22–29)
Calcium: 9.4 mg/dL (ref 8.4–10.4)
Chloride: 106 mEq/L (ref 98–109)
Creatinine: 0.9 mg/dL (ref 0.6–1.1)
EGFR: 61 mL/min/{1.73_m2} — AB (ref >90)
Glucose: 80 mg/dl (ref 70–140)
Potassium: 4.1 mEq/L (ref 3.5–5.1)
SODIUM: 139 meq/L (ref 136–145)
Total Protein: 7.8 g/dL (ref 6.4–8.3)

## 2017-09-03 LAB — CBC WITH DIFFERENTIAL/PLATELET
BASO%: 0.4 % (ref 0.0–2.0)
BASOS ABS: 0 10*3/uL (ref 0.0–0.1)
EOS ABS: 0.1 10*3/uL (ref 0.0–0.5)
EOS%: 2.4 % (ref 0.0–7.0)
HCT: 33.5 % — ABNORMAL LOW (ref 34.8–46.6)
HEMOGLOBIN: 10.8 g/dL — AB (ref 11.6–15.9)
LYMPH%: 16.1 % (ref 14.0–49.7)
MCH: 28.5 pg (ref 25.1–34.0)
MCHC: 32.3 g/dL (ref 31.5–36.0)
MCV: 88.3 fL (ref 79.5–101.0)
MONO#: 0.4 10*3/uL (ref 0.1–0.9)
MONO%: 8.6 % (ref 0.0–14.0)
NEUT#: 3.1 10*3/uL (ref 1.5–6.5)
NEUT%: 72.5 % (ref 38.4–76.8)
Platelets: 91 10*3/uL — ABNORMAL LOW (ref 145–400)
RBC: 3.79 10*6/uL (ref 3.70–5.45)
RDW: 15.3 % — ABNORMAL HIGH (ref 11.2–14.5)
WBC: 4.2 10*3/uL (ref 3.9–10.3)
lymph#: 0.7 10*3/uL — ABNORMAL LOW (ref 0.9–3.3)

## 2017-09-03 MED ORDER — IOPAMIDOL (ISOVUE-300) INJECTION 61%
100.0000 mL | Freq: Once | INTRAVENOUS | Status: AC | PRN
  Administered 2017-09-03: 100 mL via INTRAVENOUS

## 2017-09-03 MED ORDER — IOPAMIDOL (ISOVUE-300) INJECTION 61%
INTRAVENOUS | Status: AC
  Filled 2017-09-03: qty 100

## 2017-09-05 ENCOUNTER — Telehealth: Payer: Self-pay | Admitting: Internal Medicine

## 2017-09-05 ENCOUNTER — Encounter: Payer: Self-pay | Admitting: Oncology

## 2017-09-05 ENCOUNTER — Ambulatory Visit (HOSPITAL_BASED_OUTPATIENT_CLINIC_OR_DEPARTMENT_OTHER): Payer: Medicare Other | Admitting: Oncology

## 2017-09-05 VITALS — BP 143/57 | HR 77 | Temp 98.2°F | Resp 18 | Ht 62.0 in | Wt 157.4 lb

## 2017-09-05 DIAGNOSIS — C3432 Malignant neoplasm of lower lobe, left bronchus or lung: Secondary | ICD-10-CM

## 2017-09-05 DIAGNOSIS — C3492 Malignant neoplasm of unspecified part of left bronchus or lung: Secondary | ICD-10-CM

## 2017-09-05 DIAGNOSIS — Z5111 Encounter for antineoplastic chemotherapy: Secondary | ICD-10-CM

## 2017-09-05 DIAGNOSIS — C799 Secondary malignant neoplasm of unspecified site: Secondary | ICD-10-CM

## 2017-09-05 DIAGNOSIS — C7951 Secondary malignant neoplasm of bone: Secondary | ICD-10-CM

## 2017-09-05 NOTE — Assessment & Plan Note (Signed)
This is a very pleasant 71 year old Hispanic female with a stage IV non-small cell lung cancer, adenocarcinoma with positive EGFR mutation with deletion in exon 19 diagnosed in July 2013 status post 3 years treatment with Tarceva discontinued secondary to disease progression and development of T7 29 M EGFR resistant mutation. The patient was started on treatment with Tagrisso 80 mg by mouth daily status post 18 months.   Patient was seen with Dr. Julien Nordmann. Recent restaging CT scan results were discussed. CT scan shows stable disease. She continues to tolerate her treatment well. Recommend for the patient to continue her current treatment with Tagrisso and I will see her back for follow-up visit in one month's for evaluation and repeat lab work.  She was advised to call immediately if she has any concerning symptoms in the interval. All questions were answered. The patient knows to call the clinic with any problems, questions or concerns. We can certainly see the patient much sooner if necessary.

## 2017-09-05 NOTE — Progress Notes (Signed)
[facility] Follow up:    System, Provider Not In No address on file   DIAGNOSIS: Metastatic non-small cell lung cancer, adenocarcinoma with positive EGFR mutation in exon 19 and development of resistant T790M mutation. This was initially diagnosed in July 2013.  SUMMARY OF ONCOLOGIC HISTORY:  No history exists.   PRIOR THERAPY: 1) Status post radiotherapy to the left face rib metastasis under the care of Dr. Moody.  2) stereotactic radiotherapy to the enlarging left lower lobe lung nodule under the care of Dr. Moody. 3) treatment with Tarceva 150 mg by mouth daily, therapy beginning 07/20/2012. Status post approximately 36 months of therapy. This was discontinued secondary to disease progression 4) Xgeva 120 mg subcutaneously every 4 weeks for bone metastasis.  CURRENT THERAPY: Tagrisso 80 mg by mouth daily started 02/03/2016 status post more than 18 months of treatment.  INTERVAL HISTORY: Jacqueline Leonard 70 y.o. female returns for routine follow-up. The patient is feeling fine today with no specific complaints. She continues to tolerate her current treatment with Tagrisso fairly well. She denied having any chest pain, shortness of breath, cough or hemoptysis. She denied having any fever or chills. She has no nausea, vomiting, diarrhea or constipation. She has no significant skin rash. She is here today for evaluation, repeat blood work, and to review her recent restaging CT scan results.   Patient Active Problem List   Diagnosis Date Noted  . UTI (urinary tract infection) 12/26/2016  . Acute cystitis without hematuria   . Hyponatremia   . Epistaxis 10/04/2016  . Adenocarcinoma of left lung, stage 4 (HCC) 06/13/2016  . Bone metastases (HCC) 04/11/2016  . Encounter for antineoplastic chemotherapy 02/14/2016  . Anemia in neoplastic disease 06/29/2015  . Pulmonary metastasis (HCC) 07/16/2014  . Vitamin B12 deficiency anemia 02/12/2014  . Secondary malignant  neoplasm of bone and bone marrow (HCC) 06/29/2012  . Metastatic adenocarcinoma, pathology 06/19/12 06/27/2012  . Hypertension   . Hyperlipidemia   . Vitamin D deficiency     is allergic to penicillins.  MEDICAL HISTORY: Past Medical History:  Diagnosis Date  . Allergy   . Anxiety   . Cancer, metastatic to bone (HCC) 06/19/12   bx=L 5th ribmetastatic Adenocarcinoma with known lung mass  . Encounter for antineoplastic chemotherapy 02/14/2016  . External hemorrhoids   . Hyperlipidemia   . Hypertension   . Lung mass   . Radiation 07/11/12-07/24/12   Palliative lung tx 30 gray in 10 fx  . S/P radiation therapy 07/22/14-07/31/14   left lung/60gy/5fx  . Status post chemoradiation    Tarceva  . Vitamin D deficiency     SURGICAL HISTORY: Past Surgical History:  Procedure Laterality Date  . APPENDECTOMY    . OVARIAN CYST REMOVAL    . SOFT TISSUE BIOPSY  06/19/12   L 5th rib=metastatic adenocarcinoma    SOCIAL HISTORY: Social History   Social History  . Marital status: Married    Spouse name: N/A  . Number of children: N/A  . Years of education: N/A   Occupational History  . Not on file.   Social History Main Topics  . Smoking status: Never Smoker  . Smokeless tobacco: Never Used  . Alcohol use No  . Drug use: No  . Sexual activity: No   Other Topics Concern  . Not on file   Social History Narrative  . No narrative on file    FAMILY HISTORY: Family History  Problem Relation Age of Onset  . Breast   cancer Sister   . Breast cancer Sister   . Lung cancer Brother     Review of Systems  Constitutional: Negative.   HENT:  Negative.   Eyes: Negative.   Respiratory: Negative.   Cardiovascular: Negative.   Gastrointestinal: Negative.   Genitourinary: Negative.    Musculoskeletal: Negative.   Skin: Negative.   Neurological: Negative.   Hematological: Negative.   Psychiatric/Behavioral: Negative.       PHYSICAL EXAMINATION  ECOG PERFORMANCE STATUS: 1 -  Symptomatic but completely ambulatory  Vitals:   09/05/17 1500  BP: (!) 143/57  Pulse: 77  Resp: 18  Temp: 98.2 F (36.8 C)  SpO2: 100%    Physical Exam  Constitutional: She is oriented to person, place, and time and well-developed, well-nourished, and in no distress. No distress.  HENT:  Head: Normocephalic.  Mouth/Throat: Oropharynx is clear and moist. No oropharyngeal exudate.  Eyes: Conjunctivae are normal. Right eye exhibits no discharge. Left eye exhibits no discharge. No scleral icterus.  Neck: Normal range of motion. Neck supple.  Cardiovascular: Normal rate, regular rhythm, normal heart sounds and intact distal pulses.   Pulmonary/Chest: Effort normal and breath sounds normal. No respiratory distress. She has no wheezes. She has no rales.  Abdominal: Soft. Bowel sounds are normal. She exhibits no distension and no mass. There is no tenderness.  Musculoskeletal: Normal range of motion. She exhibits no edema.  Lymphadenopathy:    She has no cervical adenopathy.  Neurological: She is alert and oriented to person, place, and time. She exhibits normal muscle tone. Gait normal. Coordination normal.  Skin: Skin is warm and dry. No rash noted. She is not diaphoretic. No erythema. No pallor.  Psychiatric: Mood, memory, affect and judgment normal.  Vitals reviewed.   LABORATORY DATA:  CBC    Component Value Date/Time   WBC 4.2 09/03/2017 1414   WBC 5.6 12/26/2016 1032   RBC 3.79 09/03/2017 1414   RBC 3.65 (L) 12/26/2016 1032   HGB 10.8 (L) 09/03/2017 1414   HCT 33.5 (L) 09/03/2017 1414   PLT 91 (L) 09/03/2017 1414   MCV 88.3 09/03/2017 1414   MCH 28.5 09/03/2017 1414   MCH 28.5 12/26/2016 1032   MCHC 32.3 09/03/2017 1414   MCHC 32.8 12/26/2016 1032   RDW 15.3 (H) 09/03/2017 1414   LYMPHSABS 0.7 (L) 09/03/2017 1414   MONOABS 0.4 09/03/2017 1414   EOSABS 0.1 09/03/2017 1414   BASOSABS 0.0 09/03/2017 1414    CMP     Component Value Date/Time   NA 139 09/03/2017  1415   K 4.1 09/03/2017 1415   CL 99 (L) 12/26/2016 1032   CL 102 05/06/2013 1511   CO2 27 09/03/2017 1415   GLUCOSE 80 09/03/2017 1415   GLUCOSE 92 05/06/2013 1511   BUN 22.7 09/03/2017 1415   CREATININE 0.9 09/03/2017 1415   CALCIUM 9.4 09/03/2017 1415   PROT 7.8 09/03/2017 1415   ALBUMIN 3.4 (L) 09/03/2017 1415   AST 28 09/03/2017 1415   ALT 21 09/03/2017 1415   ALKPHOS 72 09/03/2017 1415   BILITOT 0.46 09/03/2017 1415   GFRNONAA 49 (L) 12/26/2016 1032   GFRAA 57 (L) 12/26/2016 1032    RADIOGRAPHIC STUDIES:  Ct Chest W Contrast  Result Date: 09/03/2017 CLINICAL DATA:  Metastatic lung cancer to bone. Ongoing chemotherapy. EXAM: CT CHEST, ABDOMEN, AND PELVIS WITH CONTRAST TECHNIQUE: Multidetector CT imaging of the chest, abdomen and pelvis was performed following the standard protocol during bolus administration of intravenous contrast. CONTRAST:    131m ISOVUE-300 IOPAMIDOL (ISOVUE-300) INJECTION 61% COMPARISON:  Multiple exams, including 05/28/2017 FINDINGS: CT CHEST FINDINGS Cardiovascular: Mild atherosclerotic calcification of the aortic arch. Mild cardiomegaly. Mediastinum/Nodes: No pathologic thoracic adenopathy identified. Lungs/Pleura: Similar appearance of small left pleural effusion. Stable left lower lobe infrahilar focal airspace opacity, likely therapy related, without changing contour. Stable nodularity in faint ground-glass opacities in both upper lobes without progression. Mild peripheral interstitial accentuation bilaterally, similar to prior. Musculoskeletal: Bony expansion in speckling of the left fourth rib laterally similar to the prior exam. At multiple levels there is trabecular accentuation and some bony irregularity which could be related to hemangiomas, although at some levels such as the T3 and T6 levels there is underlying heterogeneity which could also be from metastatic disease. No appreciable change in bony findings compared to prior exam. CT ABDOMEN PELVIS  FINDINGS Hepatobiliary: Potential wall thickening or hyperplastic cholecystosis in the gallbladder. Otherwise unremarkable. Pancreas: Unremarkable Spleen: Unremarkable Adrenals/Urinary Tract: 3.4 cm left kidney lower pole cyst. Adrenal glands normal. Stomach/Bowel: A polypoid intraluminal structure at the ileocecal valve was not present previously and likely represents colonic stool. Overall unremarkable. Vascular/Lymphatic: Aortoiliac atherosclerotic vascular disease. Right gastric node 0.9 cm in short axis on image 49/2, previously 0.7 cm. Faint stranding at the root of mesentery. No overt pathologic adenopathy in the abdomen. Reproductive:  Calcified uterine fibroid. Other: No supplemental non-categorized findings. Musculoskeletal: Bilateral pars defects at L5 with 12 mm grade 1 anterolisthesis and mild bilateral foraminal impingement at this level. Vertebral hypodensities favor hemangiomas over malignancy. IMPRESSION: 1. Stable appearance of pulmonary findings which are likely mostly therapy related, and nonprogressive findings of osseous metastatic disease. 2. Stable small left pleural effusion. 3. Other imaging findings of potential clinical significance: Aortic Atherosclerosis (ICD10-I70.0). Cardiomegaly. Potential mild wall thickening of the gallbladder, query hyperplastic cholecystosis. Faint stranding at the root of the mesentery, nonspecific. Calcified uterine fibroid. Bilateral pars defects at L5 with anterolisthesis. Electronically Signed   By: WVan ClinesM.D.   On: 09/03/2017 16:32   Ct Abdomen Pelvis W Contrast  Result Date: 09/03/2017 CLINICAL DATA:  Metastatic lung cancer to bone. Ongoing chemotherapy. EXAM: CT CHEST, ABDOMEN, AND PELVIS WITH CONTRAST TECHNIQUE: Multidetector CT imaging of the chest, abdomen and pelvis was performed following the standard protocol during bolus administration of intravenous contrast. CONTRAST:  1035mISOVUE-300 IOPAMIDOL (ISOVUE-300) INJECTION 61%  COMPARISON:  Multiple exams, including 05/28/2017 FINDINGS: CT CHEST FINDINGS Cardiovascular: Mild atherosclerotic calcification of the aortic arch. Mild cardiomegaly. Mediastinum/Nodes: No pathologic thoracic adenopathy identified. Lungs/Pleura: Similar appearance of small left pleural effusion. Stable left lower lobe infrahilar focal airspace opacity, likely therapy related, without changing contour. Stable nodularity in faint ground-glass opacities in both upper lobes without progression. Mild peripheral interstitial accentuation bilaterally, similar to prior. Musculoskeletal: Bony expansion in speckling of the left fourth rib laterally similar to the prior exam. At multiple levels there is trabecular accentuation and some bony irregularity which could be related to hemangiomas, although at some levels such as the T3 and T6 levels there is underlying heterogeneity which could also be from metastatic disease. No appreciable change in bony findings compared to prior exam. CT ABDOMEN PELVIS FINDINGS Hepatobiliary: Potential wall thickening or hyperplastic cholecystosis in the gallbladder. Otherwise unremarkable. Pancreas: Unremarkable Spleen: Unremarkable Adrenals/Urinary Tract: 3.4 cm left kidney lower pole cyst. Adrenal glands normal. Stomach/Bowel: A polypoid intraluminal structure at the ileocecal valve was not present previously and likely represents colonic stool. Overall unremarkable. Vascular/Lymphatic: Aortoiliac atherosclerotic vascular disease. Right gastric node 0.9 cm in short axis  on image 49/2, previously 0.7 cm. Faint stranding at the root of mesentery. No overt pathologic adenopathy in the abdomen. Reproductive:  Calcified uterine fibroid. Other: No supplemental non-categorized findings. Musculoskeletal: Bilateral pars defects at L5 with 12 mm grade 1 anterolisthesis and mild bilateral foraminal impingement at this level. Vertebral hypodensities favor hemangiomas over malignancy. IMPRESSION: 1.  Stable appearance of pulmonary findings which are likely mostly therapy related, and nonprogressive findings of osseous metastatic disease. 2. Stable small left pleural effusion. 3. Other imaging findings of potential clinical significance: Aortic Atherosclerosis (ICD10-I70.0). Cardiomegaly. Potential mild wall thickening of the gallbladder, query hyperplastic cholecystosis. Faint stranding at the root of the mesentery, nonspecific. Calcified uterine fibroid. Bilateral pars defects at L5 with anterolisthesis. Electronically Signed   By: Walter  Liebkemann M.D.   On: 09/03/2017 16:32    ASSESSMENT and THERAPY PLAN:   Adenocarcinoma of left lung, stage 4 (HCC) This is a very pleasant 70 year old Hispanic female with a stage IV non-small cell lung cancer, adenocarcinoma with positive EGFR mutation with deletion in exon 19 diagnosed in July 2013 status post 3 years treatment with Tarceva discontinued secondary to disease progression and development of T7 90 M EGFR resistant mutation. The patient was started on treatment with Tagrisso 80 mg by mouth daily status post 18 months.   Patient was seen with Dr. Mohamed. Recent restaging CT scan results were discussed. CT scan shows stable disease. She continues to tolerate her treatment well. Recommend for the patient to continue her current treatment with Tagrisso and I will see her back for follow-up visit in one month's for evaluation and repeat lab work.  She was advised to call immediately if she has any concerning symptoms in the interval. All questions were answered. The patient knows to call the clinic with any problems, questions or concerns. We can certainly see the patient much sooner if necessary.   Orders Placed This Encounter  Procedures  . CBC with Differential/Platelet    Standing Status:   Standing    Number of Occurrences:   3    Standing Expiration Date:   09/05/2018  . Comprehensive metabolic panel    Standing Status:   Standing     Number of Occurrences:   3    Standing Expiration Date:   09/05/2018    All questions were answered. The patient knows to call the clinic with any problems, questions or concerns. We can certainly see the patient much sooner if necessary.  Kristin Curcio, NP 09/05/2017  ADDENDUM: Hematology/Oncology Attending: I had a face to face encounter with the patient. I recommended her care plan. This is a very pleasant 70 years old Hispanic female with metastatic non-small cell lung cancer, adenocarcinoma with positive EGFR mutation status post previous treatment with Tarceva  for 3 years followed by disease progression and development of T790M resistant mutation. Her treatment was switched to Tagrisso 80 mg by mouth daily status post 18 months of treatment has been tolerating this treatment fairly well. She had repeat CT scan of the chest, abdomen and pelvis performed recently. I personally and independently reviewed the scan results and discussed with the patient and her husband. Her scan showed no evidence for disease progression. I recommended for the patient to continue her current treatment with Tagrisso with the same dose. I would see her back for follow-up visit in one month's for reevaluation and repeat blood work. The patient was advised to call immediately if she has any concerning symptoms in the interval.    Disclaimer: This note was dictated with voice recognition software. Similar sounding words can inadvertently be transcribed and may be missed upon review. Mohamed K Mohamed, MD 09/06/17  

## 2017-09-05 NOTE — Telephone Encounter (Signed)
Gave patient avs report and appointments for November and December

## 2017-10-04 ENCOUNTER — Ambulatory Visit (HOSPITAL_BASED_OUTPATIENT_CLINIC_OR_DEPARTMENT_OTHER): Payer: Medicare Other | Admitting: Oncology

## 2017-10-04 ENCOUNTER — Other Ambulatory Visit: Payer: Self-pay | Admitting: *Deleted

## 2017-10-04 ENCOUNTER — Other Ambulatory Visit (HOSPITAL_BASED_OUTPATIENT_CLINIC_OR_DEPARTMENT_OTHER): Payer: Medicare Other

## 2017-10-04 ENCOUNTER — Encounter: Payer: Self-pay | Admitting: *Deleted

## 2017-10-04 VITALS — BP 160/42 | HR 71 | Temp 97.9°F | Resp 18 | Ht 62.0 in | Wt 158.1 lb

## 2017-10-04 DIAGNOSIS — C3432 Malignant neoplasm of lower lobe, left bronchus or lung: Secondary | ICD-10-CM

## 2017-10-04 DIAGNOSIS — C7951 Secondary malignant neoplasm of bone: Secondary | ICD-10-CM | POA: Diagnosis not present

## 2017-10-04 DIAGNOSIS — C799 Secondary malignant neoplasm of unspecified site: Secondary | ICD-10-CM

## 2017-10-04 DIAGNOSIS — Z006 Encounter for examination for normal comparison and control in clinical research program: Secondary | ICD-10-CM

## 2017-10-04 DIAGNOSIS — C3492 Malignant neoplasm of unspecified part of left bronchus or lung: Secondary | ICD-10-CM

## 2017-10-04 DIAGNOSIS — D630 Anemia in neoplastic disease: Secondary | ICD-10-CM

## 2017-10-04 DIAGNOSIS — Z5111 Encounter for antineoplastic chemotherapy: Secondary | ICD-10-CM

## 2017-10-04 LAB — CBC WITH DIFFERENTIAL/PLATELET
BASO%: 0.3 % (ref 0.0–2.0)
BASOS ABS: 0 10*3/uL (ref 0.0–0.1)
EOS ABS: 0.1 10*3/uL (ref 0.0–0.5)
EOS%: 2.5 % (ref 0.0–7.0)
HEMATOCRIT: 33.1 % — AB (ref 34.8–46.6)
HEMOGLOBIN: 10.5 g/dL — AB (ref 11.6–15.9)
LYMPH#: 0.8 10*3/uL — AB (ref 0.9–3.3)
LYMPH%: 20 % (ref 14.0–49.7)
MCH: 28.6 pg (ref 25.1–34.0)
MCHC: 31.7 g/dL (ref 31.5–36.0)
MCV: 90.2 fL (ref 79.5–101.0)
MONO#: 0.5 10*3/uL (ref 0.1–0.9)
MONO%: 11.5 % (ref 0.0–14.0)
NEUT#: 2.6 10*3/uL (ref 1.5–6.5)
NEUT%: 65.7 % (ref 38.4–76.8)
PLATELETS: 80 10*3/uL — AB (ref 145–400)
RBC: 3.67 10*6/uL — ABNORMAL LOW (ref 3.70–5.45)
RDW: 14.5 % (ref 11.2–14.5)
WBC: 4 10*3/uL (ref 3.9–10.3)

## 2017-10-04 LAB — COMPREHENSIVE METABOLIC PANEL
ALT: 18 U/L (ref 0–55)
ANION GAP: 6 meq/L (ref 3–11)
AST: 22 U/L (ref 5–34)
Albumin: 3.3 g/dL — ABNORMAL LOW (ref 3.5–5.0)
Alkaline Phosphatase: 70 U/L (ref 40–150)
BUN: 23.2 mg/dL (ref 7.0–26.0)
CHLORIDE: 107 meq/L (ref 98–109)
CO2: 28 meq/L (ref 22–29)
Calcium: 9.1 mg/dL (ref 8.4–10.4)
Creatinine: 1 mg/dL (ref 0.6–1.1)
EGFR: 56 mL/min/{1.73_m2} — AB (ref >60)
GLUCOSE: 93 mg/dL (ref 70–140)
POTASSIUM: 4 meq/L (ref 3.5–5.1)
SODIUM: 141 meq/L (ref 136–145)
Total Bilirubin: 0.56 mg/dL (ref 0.20–1.20)
Total Protein: 7.5 g/dL (ref 6.4–8.3)

## 2017-10-04 NOTE — Progress Notes (Signed)
[place] OFFICE PROGRESS NOTE  System, Provider Not In No address on file  DIAGNOSIS: Metastatic non-small cell lung cancer, adenocarcinoma with positive EGFR mutation in exon 19 and development of resistant T790M mutation. This was initially diagnosed in July 2013.  PRIOR THERAPY: 1) Status post radiotherapy to the left face rib metastasis under the care of Dr. Lisbeth Renshaw.  2) stereotactic radiotherapy to the enlarging left lower lobe lung nodule under the care of Dr. Lisbeth Renshaw. 3) treatment with Tarceva 150 mg by mouth daily, therapy beginning 07/20/2012. Status post approximately 36 months of therapy. This was discontinued secondary to disease progression 4) Xgeva 120 mg subcutaneously every 4 weeks for bone metastasis.  CURRENT THERAPY: Tagrisso 80 mg by mouth daily started 02/03/2016 status post more than 68month of treatment.  INTERVAL HISTORY: Jacqueline Leonard 71y.o. female returns for routine follow-up visit with her husband.  Patient is feeling fine today has no specific complaints.  She denies fevers and chills.  Denies chest pain, shortness breath, cough, hemoptysis.  Denies nausea, vomiting, constipation, diarrhea.  She has no significant skin rash.  Patient continues to tolerate treatment with Tagrisso fairly well.  The patient is due to schedule repeat colonoscopy in the near future.  She is here for evaluation and repeat blood work.  MEDICAL HISTORY: Past Medical History:  Diagnosis Date  . Allergy   . Anxiety   . Cancer, metastatic to bone ([facility] 06/19/12   bx=L 5th ribmetastatic Adenocarcinoma with known lung mass  . Encounter for antineoplastic chemotherapy 02/14/2016  . External hemorrhoids   . Hyperlipidemia   . Hypertension   . Lung mass   . Radiation 07/11/12-07/24/12   Palliative lung tx 30 gray in 10 fx  . S/P radiation therapy 07/22/14-07/31/14   left lung/60gy/540f . Status post chemoradiation    Tarceva  . Vitamin D deficiency     ALLERGIES:  is  allergic to penicillins.  MEDICATIONS:  Current Outpatient Medications  Medication Sig Dispense Refill  . amLODipine (NORVASC) 5 MG tablet Take 2.5 mg by mouth daily.     . cholecalciferol (VITAMIN D) 1000 UNITS tablet Take 1,000 Units by mouth daily.    . CYANOCOBALAMIN PO Take 1,000 mcg by mouth daily.    . Marland KitcheneFum-FePoly-FA-B Cmp-C-Biot (INTEGRA PLUS) CAPS Take 1 capsule by mouth every morning. 30 capsule 2  . osimertinib mesylate (TAGRISSO) 80 MG tablet Take 1 tablet (80 mg total) by mouth daily. 90 tablet 0  . potassium chloride (K-DUR) 10 MEQ tablet Take 1 tablet by mouth daily.     No current facility-administered medications for this visit.     SURGICAL HISTORY:  Past Surgical History:  Procedure Laterality Date  . APPENDECTOMY    . OVARIAN CYST REMOVAL    . SOFT TISSUE BIOPSY  06/19/12   L 5th rib=metastatic adenocarcinoma    REVIEW OF SYSTEMS:   Review of Systems  Constitutional: Negative for appetite change, chills, fatigue, fever and unexpected weight change.  HENT:   Negative for mouth sores, nosebleeds, sore throat and trouble swallowing.   Eyes: Negative for eye problems and icterus.  Respiratory: Negative for cough, hemoptysis, shortness of breath and wheezing.   Cardiovascular: Negative for chest pain and leg swelling.  Gastrointestinal: Negative for abdominal pain, constipation, diarrhea, nausea and vomiting.  Genitourinary: Negative for bladder incontinence, difficulty urinating, dysuria, frequency and hematuria.   Musculoskeletal: Negative for back pain, gait problem, neck pain and neck stiffness.  Skin: Negative for itching and rash.  Neurological: Negative for dizziness, extremity weakness, gait problem, headaches, light-headedness and seizures.  Hematological: Negative for adenopathy. Does not bruise/bleed easily.  Psychiatric/Behavioral: Negative for confusion, depression and sleep disturbance. The patient is not nervous/anxious.     PHYSICAL EXAMINATION:   Blood pressure (!) 160/42, pulse 71, temperature 97.9 F (36.6 C), temperature source Oral, resp. rate 18, height _0  (1.575 m), weight 158 lb 1.6 oz (71.7 kg), SpO2 100 %.  ECOG PERFORMANCE STATUS: 1 - Symptomatic but completely ambulatory  Physical Exam  Constitutional: Oriented to person, place, and time and well-developed, well-nourished, and in no distress. No distress.  HENT:  Head: Normocephalic and atraumatic.  Mouth/Throat: Oropharynx is clear and moist. No oropharyngeal exudate.  Eyes: Conjunctivae are normal. Right eye exhibits no discharge. Left eye exhibits no discharge. No scleral icterus.  Neck: Normal range of motion. Neck supple.  Cardiovascular: Normal rate, regular rhythm, normal heart sounds and intact distal pulses.   Pulmonary/Chest: Effort normal and breath sounds normal. No respiratory distress. No wheezes. No rales.  Abdominal: Soft. Bowel sounds are normal. Exhibits no distension and no mass. There is no tenderness.  Musculoskeletal: Normal range of motion. Exhibits no edema.  Lymphadenopathy:    No cervical adenopathy.  Neurological: Alert and oriented to person, place, and time. Exhibits normal muscle tone. Gait normal. Coordination normal.  Skin: Skin is warm and dry. No rash noted. Not diaphoretic. No erythema. No pallor.  Psychiatric: Mood, memory and judgment normal.  Vitals reviewed.  LABORATORY DATA: Lab Results  Component Value Date   WBC 4.0 10/04/2017   HGB 10.5 (L) 10/04/2017   HCT 33.1 (L) 10/04/2017   MCV 90.2 10/04/2017   PLT 80 (L) 10/04/2017      Chemistry      Component Value Date/Time   NA 141 10/04/2017 1432   K 4.0 10/04/2017 1432   CL 99 (L) 12/26/2016 1032   CL 102 05/06/2013 1511   CO2 28 10/04/2017 1432   BUN 23.2 10/04/2017 1432   CREATININE 1.0 10/04/2017 1432      Component Value Date/Time   CALCIUM 9.1 10/04/2017 1432   ALKPHOS 70 10/04/2017 1432   AST 22 10/04/2017 1432   ALT 18 10/04/2017 1432   BILITOT 0.56  10/04/2017 1432       RADIOGRAPHIC STUDIES:  No results found.   ASSESSMENT/PLAN:  Adenocarcinoma of left lung, stage 4 (HCC) This is a very pleasant 71 year old Hispanic female with a stage IV non-small cell lung cancer, adenocarcinoma with positive EGFR mutation with deletion in exon 19 diagnosed in July 2013 status post 3 years treatment with Tarceva discontinued secondary to disease progression and development of T7 12 M EGFR resistant mutation. The patient was started on treatment with Tagrisso 80 mg by mouth daily status post 19 months.    She continues to tolerate her treatment well. Recommend for the patient to continue her current treatment with Tagrisso and I will see her back for follow-up visit in one month's for evaluation and repeat lab work.  She may proceed with her repeat colonoscopy at her convenience.  She was advised to call immediately if she has any concerning symptoms in the interval. All questions were answered. The patient knows to call the clinic with any problems, questions or concerns. We can certainly see the patient much sooner if necessary.  No orders of the defined types were placed in this encounter.   Mikey Bussing, DNP, AGPCNP-BC, AOCNP 10/04/17

## 2017-10-04 NOTE — Assessment & Plan Note (Signed)
This is a very pleasant 71 year old Hispanic female with a stage IV non-small cell lung cancer, adenocarcinoma with positive EGFR mutation with deletion in exon 19 diagnosed in July 2013 status post 3 years treatment with Tarceva discontinued secondary to disease progression and development of T7 58 M EGFR resistant mutation. The patient was started on treatment with Tagrisso 80 mg by mouth daily status post 19 months.   She continues to tolerate her treatment well. Recommend for the patient to continue her current treatment with Tagrisso and I will see her back for follow-up visit in one month's for evaluation and repeat lab work.  She may proceed with her repeat colonoscopy at her convenience.  She was advised to call immediately if she has any concerning symptoms in the interval. All questions were answered. The patient knows to call the clinic with any problems, questions or concerns. We can certainly see the patient much sooner if necessary.

## 2017-10-23 ENCOUNTER — Telehealth: Payer: Self-pay | Admitting: *Deleted

## 2017-10-23 NOTE — Telephone Encounter (Signed)
FMLA paperwork on RN desk for MD signature, called pt to confirm if paperwork is still needed. Pt advised she retired in Nov 2017. After she was out of work for a period of time.

## 2017-10-31 ENCOUNTER — Other Ambulatory Visit: Payer: Self-pay | Admitting: *Deleted

## 2017-11-01 ENCOUNTER — Ambulatory Visit (HOSPITAL_BASED_OUTPATIENT_CLINIC_OR_DEPARTMENT_OTHER): Payer: Medicare Other | Admitting: Oncology

## 2017-11-01 ENCOUNTER — Encounter: Payer: Self-pay | Admitting: *Deleted

## 2017-11-01 ENCOUNTER — Other Ambulatory Visit (HOSPITAL_BASED_OUTPATIENT_CLINIC_OR_DEPARTMENT_OTHER): Payer: Medicare Other

## 2017-11-01 ENCOUNTER — Encounter: Payer: Self-pay | Admitting: Oncology

## 2017-11-01 VITALS — BP 169/55 | HR 67 | Temp 97.8°F | Resp 19 | Ht 62.0 in | Wt 159.9 lb

## 2017-11-01 DIAGNOSIS — C7951 Secondary malignant neoplasm of bone: Secondary | ICD-10-CM | POA: Diagnosis not present

## 2017-11-01 DIAGNOSIS — C3432 Malignant neoplasm of lower lobe, left bronchus or lung: Secondary | ICD-10-CM

## 2017-11-01 DIAGNOSIS — Z5111 Encounter for antineoplastic chemotherapy: Secondary | ICD-10-CM

## 2017-11-01 DIAGNOSIS — C799 Secondary malignant neoplasm of unspecified site: Secondary | ICD-10-CM

## 2017-11-01 DIAGNOSIS — Z006 Encounter for examination for normal comparison and control in clinical research program: Secondary | ICD-10-CM

## 2017-11-01 DIAGNOSIS — C3492 Malignant neoplasm of unspecified part of left bronchus or lung: Secondary | ICD-10-CM

## 2017-11-01 LAB — CBC WITH DIFFERENTIAL/PLATELET
BASO%: 0.3 % (ref 0.0–2.0)
Basophils Absolute: 0 10*3/uL (ref 0.0–0.1)
EOS%: 2.6 % (ref 0.0–7.0)
Eosinophils Absolute: 0.1 10*3/uL (ref 0.0–0.5)
HEMATOCRIT: 34.8 % (ref 34.8–46.6)
HEMOGLOBIN: 11.2 g/dL — AB (ref 11.6–15.9)
LYMPH#: 0.7 10*3/uL — AB (ref 0.9–3.3)
LYMPH%: 18.1 % (ref 14.0–49.7)
MCH: 29.1 pg (ref 25.1–34.0)
MCHC: 32.2 g/dL (ref 31.5–36.0)
MCV: 90.4 fL (ref 79.5–101.0)
MONO#: 0.4 10*3/uL (ref 0.1–0.9)
MONO%: 9.7 % (ref 0.0–14.0)
NEUT%: 69.3 % (ref 38.4–76.8)
NEUTROS ABS: 2.6 10*3/uL (ref 1.5–6.5)
PLATELETS: 77 10*3/uL — AB (ref 145–400)
RBC: 3.85 10*6/uL (ref 3.70–5.45)
RDW: 14.6 % — AB (ref 11.2–14.5)
WBC: 3.8 10*3/uL — AB (ref 3.9–10.3)

## 2017-11-01 LAB — COMPREHENSIVE METABOLIC PANEL
ALBUMIN: 3.5 g/dL (ref 3.5–5.0)
ALK PHOS: 81 U/L (ref 40–150)
ALT: 21 U/L (ref 0–55)
ANION GAP: 9 meq/L (ref 3–11)
AST: 30 U/L (ref 5–34)
BILIRUBIN TOTAL: 0.45 mg/dL (ref 0.20–1.20)
BUN: 21.2 mg/dL (ref 7.0–26.0)
CO2: 25 mEq/L (ref 22–29)
Calcium: 9.3 mg/dL (ref 8.4–10.4)
Chloride: 106 mEq/L (ref 98–109)
Creatinine: 1 mg/dL (ref 0.6–1.1)
EGFR: 53 mL/min/{1.73_m2} — AB (ref >60)
GLUCOSE: 98 mg/dL (ref 70–140)
POTASSIUM: 3.7 meq/L (ref 3.5–5.1)
SODIUM: 140 meq/L (ref 136–145)
Total Protein: 8 g/dL (ref 6.4–8.3)

## 2017-11-01 LAB — RESEARCH LABS

## 2017-11-01 NOTE — Assessment & Plan Note (Signed)
This is a very pleasant 71 year old Hispanic female with a stage IV non-small cell lung cancer, adenocarcinoma with positive EGFR mutation with deletion in exon 19 diagnosed in July 2013 status post 3 years treatment with Tarceva discontinued secondary to disease progression and development of T7 25 M EGFR resistant mutation. The patient was started on treatment with Tagrisso 80 mg by mouth daily status post 59month.    She continues to tolerate her treatment well.Recommend forthe patient to continue her current treatment with Tagrisso.  The patient will have a restaging CT scan of the chest, abdomen, pelvis prior to her next visit in 1 month.  She will return for follow-up visit 1-2 days after her scans to review those results.  She was advised to call immediately if she has any concerning symptoms in the interval. All questions were answered. The patient knows to call the clinic with any problems, questions or concerns. We can certainly see the patient much sooner if necessary.

## 2017-11-01 NOTE — Progress Notes (Signed)
[place] OFFICE PROGRESS NOTE  System, Provider Not In No address on file  DIAGNOSIS: Metastatic non-small cell lung cancer, adenocarcinoma with positive EGFR mutation in exon 19 and development of resistant T790M mutation. This was initially diagnosed in July 2013.  PRIOR THERAPY: 1) Status post radiotherapy to the left face rib metastasis under the care of Dr. Lisbeth Leonard.  2) stereotactic radiotherapy to the enlarging left lower lobe lung nodule under the care of Dr. Lisbeth Leonard. 3) treatment with Tarceva 150 mg by mouth daily, therapy beginning 07/20/2012. Status post approximately 36 months of therapy. This was discontinued secondary to disease progression 4)Xgeva 120 mg subcutaneously every 4 weeks for bone metastasis.  CURRENT THERAPY: Tagrisso 80 mg by mouth daily started 02/03/2016 status post more than 39month of treatment.  INTERVAL HISTORY: Jacqueline Leonard 71y.o. female returns for routine follow-up visit accompanied by her niece.  The patient is feeling fine today and has no specific complaints.  She continues to tolerate her treatment with Tagrisso fairly well. She denies fevers and chills.  Denies chest pain, shortness breath, cough, hemoptysis.  Denies nausea, vomiting, constipation, diarrhea.  She has no significant skin rash.  She is here for evaluation and repeat blood work.  MEDICAL HISTORY: Past Medical History:  Diagnosis Date  . Allergy   . Anxiety   . Cancer, metastatic to bone ([place] 06/19/12   bx=L 5th ribmetastatic Adenocarcinoma with known lung mass  . Encounter for antineoplastic chemotherapy 02/14/2016  . External hemorrhoids   . Hyperlipidemia   . Hypertension   . Lung mass   . Radiation 07/11/12-07/24/12   Palliative lung tx 30 gray in 10 fx  . S/P radiation therapy 07/22/14-07/31/14   left lung/60gy/534f . Status post chemoradiation    Tarceva  . Vitamin D deficiency     ALLERGIES:  is allergic to penicillins.  MEDICATIONS:  Current  Outpatient Medications  Medication Sig Dispense Refill  . amLODipine (NORVASC) 5 MG tablet Take 2.5 mg by mouth daily.     . cholecalciferol (VITAMIN D) 1000 UNITS tablet Take 1,000 Units by mouth daily.    . CYANOCOBALAMIN PO Take 1,000 mcg by mouth daily.    . Marland KitcheneFum-FePoly-FA-B Cmp-C-Biot (INTEGRA PLUS) CAPS Take 1 capsule by mouth every morning. 30 capsule 2  . osimertinib mesylate (TAGRISSO) 80 MG tablet Take 1 tablet (80 mg total) by mouth daily. 90 tablet 0  . potassium chloride (K-DUR) 10 MEQ tablet Take 1 tablet by mouth daily.     No current facility-administered medications for this visit.     SURGICAL HISTORY:  Past Surgical History:  Procedure Laterality Date  . APPENDECTOMY    . OVARIAN CYST REMOVAL    . SOFT TISSUE BIOPSY  06/19/12   L 5th rib=metastatic adenocarcinoma    REVIEW OF SYSTEMS:   Review of Systems  Constitutional: Negative for appetite change, chills, fatigue, fever and unexpected weight change.  HENT:   Negative for mouth sores, nosebleeds, sore throat and trouble swallowing.   Eyes: Negative for eye problems and icterus.  Respiratory: Negative for cough, hemoptysis, shortness of breath and wheezing.   Cardiovascular: Negative for chest pain and leg swelling.  Gastrointestinal: Negative for abdominal pain, constipation, diarrhea, nausea and vomiting.  Genitourinary: Negative for bladder incontinence, difficulty urinating, dysuria, frequency and hematuria.   Musculoskeletal: Negative for back pain, gait problem, neck pain and neck stiffness.  Skin: Negative for itching and rash.  Neurological: Negative for dizziness, extremity weakness, gait problem, headaches, light-headedness and  seizures.  Hematological: Negative for adenopathy. Does not bruise/bleed easily.  Psychiatric/Behavioral: Negative for confusion, depression and sleep disturbance. The patient is not nervous/anxious.     PHYSICAL EXAMINATION:  Blood pressure (!) 169/55, pulse 67, temperature  97.8 F (36.6 C), temperature source Oral, resp. rate 19, height 5' 2"  (1.575 m), weight 159 lb 14.4 oz (72.5 kg), SpO2 100 %.  ECOG PERFORMANCE STATUS: 1 - Symptomatic but completely ambulatory  Physical Exam  Constitutional: Oriented to person, place, and time and well-developed, well-nourished, and in no distress. No distress.  HENT:  Head: Normocephalic and atraumatic.  Mouth/Throat: Oropharynx is clear and moist. No oropharyngeal exudate.  Eyes: Conjunctivae are normal. Right eye exhibits no discharge. Left eye exhibits no discharge. No scleral icterus.  Neck: Normal range of motion. Neck supple.  Cardiovascular: Normal rate, regular rhythm, normal heart sounds and intact distal pulses.   Pulmonary/Chest: Effort normal and breath sounds normal. No respiratory distress. No wheezes. No rales.  Abdominal: Soft. Bowel sounds are normal. Exhibits no distension and no mass. There is no tenderness.  Musculoskeletal: Normal range of motion. Exhibits no edema.  Lymphadenopathy:    No cervical adenopathy.  Neurological: Alert and oriented to person, place, and time. Exhibits normal muscle tone. Gait normal. Coordination normal.  Skin: Skin is warm and dry. No rash noted. Not diaphoretic. No erythema. No pallor.  Psychiatric: Mood, memory and judgment normal.  Vitals reviewed.  LABORATORY DATA: Lab Results  Component Value Date   WBC 3.8 (L) 11/01/2017   HGB 11.2 (L) 11/01/2017   HCT 34.8 11/01/2017   MCV 90.4 11/01/2017   PLT 77 (L) 11/01/2017      Chemistry      Component Value Date/Time   NA 140 11/01/2017 1423   K 3.7 11/01/2017 1423   CL 99 (L) 12/26/2016 1032   CL 102 05/06/2013 1511   CO2 25 11/01/2017 1423   BUN 21.2 11/01/2017 1423   CREATININE 1.0 11/01/2017 1423      Component Value Date/Time   CALCIUM 9.3 11/01/2017 1423   ALKPHOS 81 11/01/2017 1423   AST 30 11/01/2017 1423   ALT 21 11/01/2017 1423   BILITOT 0.45 11/01/2017 1423       RADIOGRAPHIC  STUDIES:  No results found.   ASSESSMENT/PLAN:  Adenocarcinoma of left lung, stage 4 (HCC) This is a very pleasant 71 year old Hispanic female with a stage IV non-small cell lung cancer, adenocarcinoma with positive EGFR mutation with deletion in exon 19 diagnosed in July 2013 status post 3 years treatment with Tarceva discontinued secondary to disease progression and development of T7 71 M EGFR resistant mutation. The patient was started on treatment with Tagrisso 80 mg by mouth daily status post 54month.    She continues to tolerate her treatment well.Recommend forthe patient to continue her current treatment with Tagrisso.  The patient will have a restaging CT scan of the chest, abdomen, pelvis prior to her next visit in 1 month.  She will return for follow-up visit 1-2 days after her scans to review those results.  She was advised to call immediately if she has any concerning symptoms in the interval. All questions were answered. The patient knows to call the clinic with any problems, questions or concerns. We can certainly see the patient much sooner if necessary.  Orders Placed This Encounter  Procedures  . CT ABDOMEN PELVIS W CONTRAST    Standing Status:   Future    Standing Expiration Date:   11/01/2018  Order Specific Question:   If indicated for the ordered procedure, I authorize the administration of contrast media per Radiology protocol    Answer:   Yes    Order Specific Question:   Preferred imaging location?    Answer:   [facility]    Order Specific Question:   Radiology Contrast Protocol - do NOT remove file path    Answer:   [email or web address]    Order Specific Question:   Reason for Exam additional comments    Answer:   lung cancer. restaging.  . CT CHEST W CONTRAST    Standing Status:   Future    Standing Expiration Date:   11/01/2018    Order Specific Question:   If indicated for the ordered procedure, I authorize the  administration of contrast media per Radiology protocol    Answer:   Yes    Order Specific Question:   Preferred imaging location?    Answer:   [facility]    Order Specific Question:   Radiology Contrast Protocol - do NOT remove file path    Answer:   [email or web address]    Order Specific Question:   Reason for Exam additional comments    Answer:   lung cancer. restaging.    Mikey Bussing, DNP, AGPCNP-BC, AOCNP 11/01/17

## 2017-11-13 ENCOUNTER — Telehealth: Payer: Self-pay | Admitting: Pharmacy Technician

## 2017-11-13 NOTE — Telephone Encounter (Signed)
Oral Oncology Patient Advocate Encounter  Met patient in [facility] to complete a renewal application for [place] in an effort to keep the patient's out of pocket expense for Tagrisso at $0.    Application completed and faxed to [phone].   [place] phone number for follow up is [phone].   This encounter will be updated until final determination.  Emily M. Howes CPhT, AAS [facility] Patient Advocate [facility] - [facility] [phone] 11/13/2017 4:03 PM   

## 2017-11-30 ENCOUNTER — Ambulatory Visit (HOSPITAL_COMMUNITY)
Admission: RE | Admit: 2017-11-30 | Discharge: 2017-11-30 | Disposition: A | Discharge status: Home / Self Care | Payer: Medicare Other | Source: Ambulatory Visit | Attending: Oncology | Admitting: Oncology

## 2017-11-30 DIAGNOSIS — K829 Disease of gallbladder, unspecified: Secondary | ICD-10-CM | POA: Diagnosis not present

## 2017-11-30 DIAGNOSIS — K766 Portal hypertension: Secondary | ICD-10-CM | POA: Insufficient documentation

## 2017-11-30 DIAGNOSIS — I700 Atherosclerosis of aorta: Secondary | ICD-10-CM | POA: Insufficient documentation

## 2017-11-30 DIAGNOSIS — M899 Disorder of bone, unspecified: Secondary | ICD-10-CM | POA: Insufficient documentation

## 2017-11-30 DIAGNOSIS — C3492 Malignant neoplasm of unspecified part of left bronchus or lung: Secondary | ICD-10-CM | POA: Diagnosis not present

## 2017-11-30 DIAGNOSIS — C3490 Malignant neoplasm of unspecified part of unspecified bronchus or lung: Secondary | ICD-10-CM | POA: Diagnosis not present

## 2017-11-30 DIAGNOSIS — I517 Cardiomegaly: Secondary | ICD-10-CM | POA: Insufficient documentation

## 2017-11-30 DIAGNOSIS — K769 Liver disease, unspecified: Secondary | ICD-10-CM | POA: Insufficient documentation

## 2017-11-30 DIAGNOSIS — R9389 Abnormal findings on diagnostic imaging of other specified body structures: Secondary | ICD-10-CM | POA: Insufficient documentation

## 2017-11-30 DIAGNOSIS — J90 Pleural effusion, not elsewhere classified: Secondary | ICD-10-CM | POA: Diagnosis not present

## 2017-11-30 DIAGNOSIS — J9811 Atelectasis: Secondary | ICD-10-CM | POA: Diagnosis not present

## 2017-11-30 DIAGNOSIS — C7951 Secondary malignant neoplasm of bone: Secondary | ICD-10-CM | POA: Diagnosis not present

## 2017-11-30 DIAGNOSIS — R918 Other nonspecific abnormal finding of lung field: Secondary | ICD-10-CM | POA: Insufficient documentation

## 2017-11-30 DIAGNOSIS — Z5111 Encounter for antineoplastic chemotherapy: Secondary | ICD-10-CM | POA: Diagnosis not present

## 2017-11-30 DIAGNOSIS — D259 Leiomyoma of uterus, unspecified: Secondary | ICD-10-CM | POA: Insufficient documentation

## 2017-11-30 MED ORDER — IOPAMIDOL (ISOVUE-300) INJECTION 61%
INTRAVENOUS | Status: AC
  Filled 2017-11-30: qty 100

## 2017-11-30 MED ORDER — IOPAMIDOL (ISOVUE-300) INJECTION 61%
100.0000 mL | Freq: Once | INTRAVENOUS | Status: AC | PRN
  Administered 2017-11-30: 100 mL via INTRAVENOUS

## 2017-12-03 ENCOUNTER — Telehealth: Payer: Self-pay | Admitting: Internal Medicine

## 2017-12-03 ENCOUNTER — Inpatient Hospital Stay: Payer: Medicare Other | Attending: Internal Medicine | Admitting: Internal Medicine

## 2017-12-03 ENCOUNTER — Encounter: Payer: Self-pay | Admitting: Internal Medicine

## 2017-12-03 ENCOUNTER — Inpatient Hospital Stay: Payer: Medicare Other

## 2017-12-03 VITALS — BP 144/40 | HR 73 | Temp 98.2°F | Resp 18 | Ht 62.0 in | Wt 159.2 lb

## 2017-12-03 DIAGNOSIS — C3492 Malignant neoplasm of unspecified part of left bronchus or lung: Secondary | ICD-10-CM

## 2017-12-03 DIAGNOSIS — C3432 Malignant neoplasm of lower lobe, left bronchus or lung: Secondary | ICD-10-CM | POA: Insufficient documentation

## 2017-12-03 DIAGNOSIS — Z5111 Encounter for antineoplastic chemotherapy: Secondary | ICD-10-CM

## 2017-12-03 DIAGNOSIS — D219 Benign neoplasm of connective and other soft tissue, unspecified: Secondary | ICD-10-CM

## 2017-12-03 DIAGNOSIS — I10 Essential (primary) hypertension: Secondary | ICD-10-CM | POA: Insufficient documentation

## 2017-12-03 DIAGNOSIS — E559 Vitamin D deficiency, unspecified: Secondary | ICD-10-CM | POA: Insufficient documentation

## 2017-12-03 DIAGNOSIS — C7951 Secondary malignant neoplasm of bone: Secondary | ICD-10-CM | POA: Insufficient documentation

## 2017-12-03 DIAGNOSIS — C799 Secondary malignant neoplasm of unspecified site: Secondary | ICD-10-CM

## 2017-12-03 DIAGNOSIS — N859 Noninflammatory disorder of uterus, unspecified: Secondary | ICD-10-CM

## 2017-12-03 LAB — COMPREHENSIVE METABOLIC PANEL
ALK PHOS: 78 U/L (ref 40–150)
ALT: 18 U/L (ref 0–55)
AST: 22 U/L (ref 5–34)
Albumin: 3.3 g/dL — ABNORMAL LOW (ref 3.5–5.0)
Anion gap: 6 (ref 3–11)
BUN: 28 mg/dL — ABNORMAL HIGH (ref 7–26)
CALCIUM: 9.2 mg/dL (ref 8.4–10.4)
CO2: 27 mmol/L (ref 22–29)
CREATININE: 1.06 mg/dL (ref 0.60–1.10)
Chloride: 107 mmol/L (ref 98–109)
GFR calc non Af Amer: 51 mL/min — ABNORMAL LOW (ref >60)
GFR, EST AFRICAN AMERICAN: 59 mL/min — AB (ref >60)
Glucose, Bld: 103 mg/dL (ref 70–140)
Potassium: 4 mmol/L (ref 3.3–4.7)
Sodium: 140 mmol/L (ref 136–145)
Total Bilirubin: 0.4 mg/dL (ref 0.2–1.2)
Total Protein: 7.4 g/dL (ref 6.4–8.3)

## 2017-12-03 LAB — CBC WITH DIFFERENTIAL/PLATELET
Abs Granulocyte: 3.1 10*3/uL (ref 1.5–6.5)
BASOS ABS: 0 10*3/uL (ref 0.0–0.1)
Basophils Relative: 0 %
EOS PCT: 2 %
Eosinophils Absolute: 0.1 10*3/uL (ref 0.0–0.5)
HCT: 33.1 % — ABNORMAL LOW (ref 34.8–46.6)
HEMOGLOBIN: 10.5 g/dL — AB (ref 11.6–15.9)
LYMPHS PCT: 19 %
Lymphs Abs: 0.8 10*3/uL — ABNORMAL LOW (ref 0.9–3.3)
MCH: 28.5 pg (ref 25.1–34.0)
MCHC: 31.7 g/dL (ref 31.5–36.0)
MCV: 89.7 fL (ref 79.5–101.0)
MONOS PCT: 8 %
Monocytes Absolute: 0.4 10*3/uL (ref 0.1–0.9)
NEUTROS PCT: 71 %
Neutro Abs: 3.1 10*3/uL (ref 1.5–6.5)
PLATELETS: 87 10*3/uL — AB (ref 145–400)
RBC: 3.69 MIL/uL — ABNORMAL LOW (ref 3.70–5.45)
RDW: 14.4 % (ref 11.2–16.1)
WBC: 4.3 10*3/uL (ref 3.9–10.3)

## 2017-12-03 NOTE — Progress Notes (Signed)
[place] [phone]   [phone]  OFFICE PROGRESS NOTE  System, Provider Not In No address on file  DIAGNOSIS: Metastatic non-small cell lung cancer, adenocarcinoma with positive EGFR mutation in exon 19 and development of resistant T790M mutation. This was initially diagnosed in July 2013.  PRIOR THERAPY: 1) Status post radiotherapy to the left face rib metastasis under the care of Dr. Lisbeth Renshaw.  2) stereotactic radiotherapy to the enlarging left lower lobe lung nodule under the care of Dr. Lisbeth Renshaw. 3) treatment with Tarceva 150 mg by mouth daily, therapy beginning 07/20/2012. Status post approximately 36 months of therapy. This was discontinued secondary to disease progression  CURRENT THERAPY: 1) Tagrisso 80 mg by mouth daily started 02/03/2016 status post more than 21 months of treatment. 2) Xgeva 120 mg subcutaneously every 4 weeks for bone metastasis.  INTERVAL HISTORY: Jacqueline Leonard 72 y.o. female returns to the clinic today for follow-up visit accompanied by her husband.  The patient has no complaints today.  She denied having any recent weight loss or night sweats.  She denied having any respiratory issues.  She has no chest pain, shortness breath, cough or hemoptysis.  She denied having any nausea, vomiting, diarrhea or constipation.  She denied having any bleeding issues.  She continues to tolerate her treatment with Tagrisso fairly well.  She had a repeat CT scan of the chest, abdomen and pelvis performed recently and she is here for evaluation and discussion of her scan results.  MEDICAL HISTORY: Past Medical History:  Diagnosis Date  . Allergy   . Anxiety   . Cancer, metastatic to bone ([place]) 06/19/12   bx=L 5th ribmetastatic Adenocarcinoma with known lung mass  . Encounter for antineoplastic chemotherapy 02/14/2016  . External hemorrhoids   . Hyperlipidemia   . Hypertension   . Lung mass   . Radiation 07/11/12-07/24/12   Palliative  lung tx 30 gray in 10 fx  . S/P radiation therapy 07/22/14-07/31/14   left lung/60gy/95f  . Status post chemoradiation    Tarceva  . Vitamin D deficiency     ALLERGIES:  is allergic to penicillins.  MEDICATIONS:  Current Outpatient Medications  Medication Sig Dispense Refill  . amLODipine (NORVASC) 5 MG tablet Take 2.5 mg by mouth daily.     . cholecalciferol (VITAMIN D) 1000 UNITS tablet Take 1,000 Units by mouth daily.    . CYANOCOBALAMIN PO Take 1,000 mcg by mouth daily.    .Marland KitchenFeFum-FePoly-FA-B Cmp-C-Biot (INTEGRA PLUS) CAPS Take 1 capsule by mouth every morning. 30 capsule 2  . osimertinib mesylate (TAGRISSO) 80 MG tablet Take 1 tablet (80 mg total) by mouth daily. 90 tablet 0  . potassium chloride (K-DUR) 10 MEQ tablet Take 1 tablet by mouth daily.     No current facility-administered medications for this visit.     SURGICAL HISTORY:  Past Surgical History:  Procedure Laterality Date  . APPENDECTOMY    . OVARIAN CYST REMOVAL    . SOFT TISSUE BIOPSY  06/19/12   L 5th rib=metastatic adenocarcinoma    REVIEW OF SYSTEMS:  Constitutional: positive for fatigue Eyes: negative Ears, nose, mouth, throat, and face: negative Respiratory: negative Cardiovascular: negative Gastrointestinal: negative Genitourinary:negative Integument/breast: negative Hematologic/lymphatic: negative Musculoskeletal:negative Neurological: negative Behavioral/Psych: negative Endocrine: negative Allergic/Immunologic: negative   PHYSICAL EXAMINATION: General appearance: alert, cooperative and no distress Head: Normocephalic, without obvious abnormality, atraumatic Neck: no adenopathy, no JVD, supple, symmetrical, trachea midline and thyroid not enlarged, symmetric, no tenderness/mass/nodules Lymph nodes:  Cervical, supraclavicular, and axillary nodes normal. Resp: clear to auscultation bilaterally Back: symmetric, no curvature. ROM normal. No CVA tenderness. Cardio: regular rate and rhythm, S1, S2  normal, no murmur, click, rub or gallop GI: soft, non-tender; bowel sounds normal; no masses,  no organomegaly Extremities: extremities normal, atraumatic, no cyanosis or edema Neurologic: Alert and oriented X 3, normal strength and tone. Normal symmetric reflexes. Normal coordination and gait  ECOG PERFORMANCE STATUS: 1 - Symptomatic but completely ambulatory  Blood pressure (!) 144/40, pulse 73, temperature 98.2 F (36.8 C), temperature source Oral, resp. rate 18, height '5\' 2"'$  (1.575 m), weight 159 lb 3.2 oz (72.2 kg), SpO2 100 %.  LABORATORY DATA: Lab Results  Component Value Date   WBC 4.3 12/03/2017   HGB 10.5 (L) 12/03/2017   HCT 33.1 (L) 12/03/2017   MCV 89.7 12/03/2017   PLT 87 (L) 12/03/2017      Chemistry      Component Value Date/Time   NA 140 11/01/2017 1423   K 3.7 11/01/2017 1423   CL 99 (L) 12/26/2016 1032   CL 102 05/06/2013 1511   CO2 25 11/01/2017 1423   BUN 21.2 11/01/2017 1423   CREATININE 1.0 11/01/2017 1423      Component Value Date/Time   CALCIUM 9.3 11/01/2017 1423   ALKPHOS 81 11/01/2017 1423   AST 30 11/01/2017 1423   ALT 21 11/01/2017 1423   BILITOT 0.45 11/01/2017 1423       RADIOGRAPHIC STUDIES: Ct Chest W Contrast  Result Date: 11/30/2017 CLINICAL DATA:  Left lung adenocarcinoma metastatic to the skeleton, ongoing oral chemotherapy, restaging. EXAM: CT CHEST, ABDOMEN, AND PELVIS WITH CONTRAST TECHNIQUE: Multidetector CT imaging of the chest, abdomen and pelvis was performed following the standard protocol during bolus administration of intravenous contrast. CONTRAST:  175m ISOVUE-300 IOPAMIDOL (ISOVUE-300) INJECTION 61% COMPARISON:  Multiple exams, including 09/03/2017 FINDINGS: CT CHEST FINDINGS Cardiovascular: Atherosclerotic calcification of the aortic arch and branch vessels. Mild cardiomegaly. Mediastinum/Nodes: No pathologic adenopathy in the chest. Lungs/Pleura: Scattered interstitial accentuation and patchy and nodular opacities in the  lungs. The nodular densities are progressive particularly in the right upper lobe. For example a 1.9 by 1.3 cm nodule in the right upper lobe on image 33/4 was previously indistinct and sub solid and currently has a more solid configuration. Similarly other areas of more faint opacity demonstrating crease solidity in the right upper lobe. An index medial right lower lobe nodule on image 99/4 measures 0.7 by 0.6 cm, stable. Appearance of some increased nodularity at the left lung apex is suspected is likely being from plugging of bronchiectatic peripheral airway is. Essentially stable bandlike volume loss and density in the left lower lobe along the major fissure and cardiac border. Small to moderate right pleural effusion has increased in size compared to the prior exam and has some degree of loculation. Musculoskeletal: Bony expansion with mixed sclerosis and lucency in the left fourth rib similar to prior. Scattered lucent vertebral lesions in the thorax are felt to primarily be from hemangiomas although some could be treated metastatic lesions. No new or progressive bony lesions are identified. CT ABDOMEN PELVIS FINDINGS Hepatobiliary: There is mild heterogeneity of density in the hepatic parenchyma, nonspecific but conceivably due to steatosis. No focal mass is appreciated. Minimal gallbladder wall thickening, query hyperplastic cholecystosis. Recanalized umbilical vein indicating portal venous hypertension. Stable slightly small caliber of the right portal vein and main portal vein, without intraluminal filling defect. The splenic vein is patent. Pancreas: Unremarkable Spleen: Unremarkable  Adrenals/Urinary Tract: Left kidney lower pole simple appearing cyst. Adrenal glands normal. Stomach/Bowel: Unremarkable Vascular/Lymphatic: Mildly prominent right gastric varices and small uphill varices adjacent to the distal esophagus. recanalized umbilical vein. Aortoiliac atherosclerotic vascular disease. No pathologic  adenopathy identified. Reproductive: Uterine fibroids noted. Mildly retroverted uterus. Mild low-density prominence in the central uterus up to 1.1 cm in thickness, possibly from a degenerated fibroid or prominence of the fundal margin of the endometrium. Other: No supplemental non-categorized findings. Musculoskeletal: 10 mm anterolisthesis at L5-S1 due to bilateral pars defects with mild resulting bilateral foraminal impingement at L5-S1. Lumbar vertebral hemangiomas. IMPRESSION: 1. Increase solidity of some of the previously sub solid nodules in the right upper lobe. These remain somewhat ill-defined along the margins and may well be infectious or inflammatory, but a progressive neoplastic etiology is difficult to confidently exclude. 2. Mild increase in size of the complex left pleural effusion, currently small to moderate. 3. Stable appearance of the skeleton with bony expansion and mild sclerosis and lucency in the left fourth rib similar to prior, and scattered hemangiomas in the spine. 4. Findings of portal venous hypertension including uphill varices and a recanalized umbilical vein. 5. Questionable thickening of the endometrium up to 1.1 cm along the fundal margin of the endometrium. Pelvic sonography may be warranted. Uterine fibroids are also observed. 6. Other imaging findings of potential clinical significance: Aortic Atherosclerosis (ICD10-I70.0). Mild cardiomegaly. Scattered interstitial accentuation in the lungs. Stable chronic atelectasis in the left lower lobe. Heterogeneous hepatic parenchyma, questionably from geographic steatosis. Minimal gallbladder wall thickening, stable. Mild bilateral foraminal impingement at L5-S1 related to the chronic L5 pars defects and grade 2 anterolisthesis at this level. Electronically Signed   By: Van Clines M.D.   On: 11/30/2017 13:47   Ct Abdomen Pelvis W Contrast  Result Date: 11/30/2017 CLINICAL DATA:  Left lung adenocarcinoma metastatic to the  skeleton, ongoing oral chemotherapy, restaging. EXAM: CT CHEST, ABDOMEN, AND PELVIS WITH CONTRAST TECHNIQUE: Multidetector CT imaging of the chest, abdomen and pelvis was performed following the standard protocol during bolus administration of intravenous contrast. CONTRAST:  129m ISOVUE-300 IOPAMIDOL (ISOVUE-300) INJECTION 61% COMPARISON:  Multiple exams, including 09/03/2017 FINDINGS: CT CHEST FINDINGS Cardiovascular: Atherosclerotic calcification of the aortic arch and branch vessels. Mild cardiomegaly. Mediastinum/Nodes: No pathologic adenopathy in the chest. Lungs/Pleura: Scattered interstitial accentuation and patchy and nodular opacities in the lungs. The nodular densities are progressive particularly in the right upper lobe. For example a 1.9 by 1.3 cm nodule in the right upper lobe on image 33/4 was previously indistinct and sub solid and currently has a more solid configuration. Similarly other areas of more faint opacity demonstrating crease solidity in the right upper lobe. An index medial right lower lobe nodule on image 99/4 measures 0.7 by 0.6 cm, stable. Appearance of some increased nodularity at the left lung apex is suspected is likely being from plugging of bronchiectatic peripheral airway is. Essentially stable bandlike volume loss and density in the left lower lobe along the major fissure and cardiac border. Small to moderate right pleural effusion has increased in size compared to the prior exam and has some degree of loculation. Musculoskeletal: Bony expansion with mixed sclerosis and lucency in the left fourth rib similar to prior. Scattered lucent vertebral lesions in the thorax are felt to primarily be from hemangiomas although some could be treated metastatic lesions. No new or progressive bony lesions are identified. CT ABDOMEN PELVIS FINDINGS Hepatobiliary: There is mild heterogeneity of density in the hepatic parenchyma, nonspecific but  conceivably due to steatosis. No focal mass is  appreciated. Minimal gallbladder wall thickening, query hyperplastic cholecystosis. Recanalized umbilical vein indicating portal venous hypertension. Stable slightly small caliber of the right portal vein and main portal vein, without intraluminal filling defect. The splenic vein is patent. Pancreas: Unremarkable Spleen: Unremarkable Adrenals/Urinary Tract: Left kidney lower pole simple appearing cyst. Adrenal glands normal. Stomach/Bowel: Unremarkable Vascular/Lymphatic: Mildly prominent right gastric varices and small uphill varices adjacent to the distal esophagus. recanalized umbilical vein. Aortoiliac atherosclerotic vascular disease. No pathologic adenopathy identified. Reproductive: Uterine fibroids noted. Mildly retroverted uterus. Mild low-density prominence in the central uterus up to 1.1 cm in thickness, possibly from a degenerated fibroid or prominence of the fundal margin of the endometrium. Other: No supplemental non-categorized findings. Musculoskeletal: 10 mm anterolisthesis at L5-S1 due to bilateral pars defects with mild resulting bilateral foraminal impingement at L5-S1. Lumbar vertebral hemangiomas. IMPRESSION: 1. Increase solidity of some of the previously sub solid nodules in the right upper lobe. These remain somewhat ill-defined along the margins and may well be infectious or inflammatory, but a progressive neoplastic etiology is difficult to confidently exclude. 2. Mild increase in size of the complex left pleural effusion, currently small to moderate. 3. Stable appearance of the skeleton with bony expansion and mild sclerosis and lucency in the left fourth rib similar to prior, and scattered hemangiomas in the spine. 4. Findings of portal venous hypertension including uphill varices and a recanalized umbilical vein. 5. Questionable thickening of the endometrium up to 1.1 cm along the fundal margin of the endometrium. Pelvic sonography may be warranted. Uterine fibroids are also observed. 6.  Other imaging findings of potential clinical significance: Aortic Atherosclerosis (ICD10-I70.0). Mild cardiomegaly. Scattered interstitial accentuation in the lungs. Stable chronic atelectasis in the left lower lobe. Heterogeneous hepatic parenchyma, questionably from geographic steatosis. Minimal gallbladder wall thickening, stable. Mild bilateral foraminal impingement at L5-S1 related to the chronic L5 pars defects and grade 2 anterolisthesis at this level. Electronically Signed   By: Van Clines M.D.   On: 11/30/2017 13:47    ASSESSMENT AND PLAN:  This is a very pleasant 72 years old Hispanic female with a stage IV non-small cell lung cancer, adenocarcinoma with positive EGFR mutation with deletion in exon 19 diagnosed in July 2013 status post 3 years treatment with Tarceva discontinued secondary to disease progression and development of T7 61 M EGFR resistant mutation. The patient was started on treatment with Tagrisso 80 mg by mouth daily status post 21 months.  The patient continues to tolerate her treatment with Tagrisso fairly well with no adverse effects. She had repeat CT scan of the chest, abdomen and pelvis performed recently.  I personally and independently reviewed the scan images and discussed the results with the patient and her husband today.  Her scan showed no concerning findings for disease progression except for increased density of some of the pulmonary nodules. There was also questionable thickening of the endometrium and fibroid in the uterus. I recommended for her to continue her current treatment with Tagrisso with the same dose. For the uterine wall thickening, I will order pelvic ultrasound and consider referring the patient to gynecology if needed. For hypertension, she will continue with her current blood pressure medications. The patient was advised to call immediately if she has any concerning symptoms in the interval. All questions were answered. The patient knows  to call the clinic with any problems, questions or concerns. We can certainly see the patient much sooner if necessary.  Disclaimer:  This note was dictated with voice recognition software. Similar sounding words can inadvertently be transcribed and may not be corrected upon review.

## 2017-12-03 NOTE — Telephone Encounter (Signed)
Gave avs and calendar for february °

## 2017-12-10 NOTE — Telephone Encounter (Signed)
.  Oral Oncology Patient Advocate Encounter  Received notification from [facility] and Me Patient Assistance program that patient has been successfully re-enrolled into their program to continue to receive Tagrisso from the manufacturer at $0 out of pocket until 12/11/2018.   I called and left a message for patient.  She knows we will have to re-apply.   Patient knows to call the office with questions or concerns.  Oral Oncology Clinic will continue to follow.  Fabio Asa. Melynda Keller, [place] Patient [place] [phone] 12/10/2017 11:24 AM

## 2017-12-11 ENCOUNTER — Ambulatory Visit (HOSPITAL_COMMUNITY)
Admission: RE | Admit: 2017-12-11 | Discharge: 2017-12-11 | Disposition: A | Discharge status: Home / Self Care | Payer: Medicare Other | Source: Ambulatory Visit | Attending: Internal Medicine | Admitting: Internal Medicine

## 2017-12-11 DIAGNOSIS — N859 Noninflammatory disorder of uterus, unspecified: Secondary | ICD-10-CM | POA: Insufficient documentation

## 2017-12-11 DIAGNOSIS — D259 Leiomyoma of uterus, unspecified: Secondary | ICD-10-CM | POA: Diagnosis not present

## 2017-12-18 DIAGNOSIS — D509 Iron deficiency anemia, unspecified: Secondary | ICD-10-CM | POA: Diagnosis not present

## 2017-12-18 DIAGNOSIS — R634 Abnormal weight loss: Secondary | ICD-10-CM | POA: Diagnosis not present

## 2017-12-18 DIAGNOSIS — K5904 Chronic idiopathic constipation: Secondary | ICD-10-CM | POA: Diagnosis not present

## 2017-12-18 DIAGNOSIS — Z1211 Encounter for screening for malignant neoplasm of colon: Secondary | ICD-10-CM | POA: Diagnosis not present

## 2018-01-01 ENCOUNTER — Ambulatory Visit: Payer: Medicare Other | Admitting: Internal Medicine

## 2018-01-01 ENCOUNTER — Other Ambulatory Visit: Payer: Medicare Other

## 2018-01-02 ENCOUNTER — Inpatient Hospital Stay: Payer: Medicare Other

## 2018-01-02 ENCOUNTER — Other Ambulatory Visit: Payer: Medicare Other

## 2018-01-02 ENCOUNTER — Ambulatory Visit: Payer: Medicare Other | Admitting: Oncology

## 2018-01-02 ENCOUNTER — Inpatient Hospital Stay: Payer: Medicare Other | Attending: Internal Medicine | Admitting: Internal Medicine

## 2018-01-02 ENCOUNTER — Ambulatory Visit: Payer: Medicare Other

## 2018-01-02 ENCOUNTER — Encounter: Payer: Self-pay | Admitting: Internal Medicine

## 2018-01-02 ENCOUNTER — Telehealth: Payer: Self-pay | Admitting: Internal Medicine

## 2018-01-02 VITALS — BP 157/51 | HR 64 | Temp 98.0°F | Resp 19 | Ht 62.0 in | Wt 159.1 lb

## 2018-01-02 DIAGNOSIS — C3492 Malignant neoplasm of unspecified part of left bronchus or lung: Secondary | ICD-10-CM

## 2018-01-02 DIAGNOSIS — C7951 Secondary malignant neoplasm of bone: Secondary | ICD-10-CM | POA: Diagnosis not present

## 2018-01-02 DIAGNOSIS — C3490 Malignant neoplasm of unspecified part of unspecified bronchus or lung: Secondary | ICD-10-CM | POA: Insufficient documentation

## 2018-01-02 DIAGNOSIS — D259 Leiomyoma of uterus, unspecified: Secondary | ICD-10-CM | POA: Diagnosis not present

## 2018-01-02 DIAGNOSIS — Z5111 Encounter for antineoplastic chemotherapy: Secondary | ICD-10-CM

## 2018-01-02 DIAGNOSIS — I10 Essential (primary) hypertension: Secondary | ICD-10-CM | POA: Insufficient documentation

## 2018-01-02 DIAGNOSIS — C799 Secondary malignant neoplasm of unspecified site: Secondary | ICD-10-CM

## 2018-01-02 LAB — CMP (CANCER CENTER ONLY)
ALK PHOS: 76 U/L (ref 40–150)
ALT: 20 U/L (ref 0–55)
ANION GAP: 9 (ref 3–11)
AST: 30 U/L (ref 5–34)
Albumin: 3.4 g/dL — ABNORMAL LOW (ref 3.5–5.0)
BILIRUBIN TOTAL: 0.4 mg/dL (ref 0.2–1.2)
BUN: 29 mg/dL — ABNORMAL HIGH (ref 7–26)
CALCIUM: 9.3 mg/dL (ref 8.4–10.4)
CO2: 26 mmol/L (ref 22–29)
Chloride: 105 mmol/L (ref 98–109)
Creatinine: 0.87 mg/dL (ref 0.60–1.10)
GFR, Estimated: >60 mL/min (ref >60)
GLUCOSE: 76 mg/dL (ref 70–140)
POTASSIUM: 4.6 mmol/L (ref 3.5–5.1)
Sodium: 140 mmol/L (ref 136–145)
TOTAL PROTEIN: 7.9 g/dL (ref 6.4–8.3)

## 2018-01-02 LAB — CBC WITH DIFFERENTIAL (CANCER CENTER ONLY)
BASOS PCT: 1 %
Basophils Absolute: 0 10*3/uL (ref 0.0–0.1)
Eosinophils Absolute: 0.1 10*3/uL (ref 0.0–0.5)
Eosinophils Relative: 2 %
HEMATOCRIT: 34.2 % — AB (ref 34.8–46.6)
HEMOGLOBIN: 11.1 g/dL — AB (ref 11.6–15.9)
LYMPHS PCT: 13 %
Lymphs Abs: 0.6 10*3/uL — ABNORMAL LOW (ref 0.9–3.3)
MCH: 28.6 pg (ref 25.1–34.0)
MCHC: 32.3 g/dL (ref 31.5–36.0)
MCV: 88.5 fL (ref 79.5–101.0)
MONO ABS: 0.4 10*3/uL (ref 0.1–0.9)
Monocytes Relative: 8 %
NEUTROS PCT: 76 %
Neutro Abs: 3.6 10*3/uL (ref 1.5–6.5)
Platelet Count: 96 10*3/uL — ABNORMAL LOW (ref 145–400)
RBC: 3.87 MIL/uL (ref 3.70–5.45)
RDW: 15.3 % — AB (ref 11.2–14.5)
WBC: 4.7 10*3/uL (ref 3.9–10.3)

## 2018-01-02 NOTE — Telephone Encounter (Signed)
Gave avs and calendar for march

## 2018-01-02 NOTE — Telephone Encounter (Signed)
Per Dr Earlie Server add patient to his schedule today

## 2018-01-02 NOTE — Progress Notes (Signed)
[place] [phone]   [phone]  OFFICE PROGRESS NOTE  System, Provider Not In No address on file  DIAGNOSIS: Metastatic non-small cell lung cancer, adenocarcinoma with positive EGFR mutation in exon 19 and development of resistant T790M mutation. This was initially diagnosed in July 2013.  PRIOR THERAPY: 1) Status post radiotherapy to the left face rib metastasis under the care of Dr. Lisbeth Renshaw.  2) stereotactic radiotherapy to the enlarging left lower lobe lung nodule under the care of Dr. Lisbeth Renshaw. 3) treatment with Tarceva 150 mg by mouth daily, therapy beginning 07/20/2012. Status post approximately 36 months of therapy. This was discontinued secondary to disease progression  CURRENT THERAPY: 1) Tagrisso 80 mg by mouth daily started 02/03/2016 status post more than 22 months of treatment. 2) Xgeva 120 mg subcutaneously every 4 weeks for bone metastasis.  INTERVAL HISTORY: Jacqueline [facility] 72 y.o. female returns to the clinic today for follow-up visit.  The patient is feeling fine today with no specific complaints except for mild fatigue.  She had pelvic ultrasound performed recently and that showed fibroid of the uterus.  She denied having any chest pain, shortness of breath, cough or hemoptysis.  She denied having any recent weight loss or night sweats.  She has no nausea, vomiting, diarrhea or constipation.  She is here today for evaluation and repeat blood work.  MEDICAL HISTORY: Past Medical History:  Diagnosis Date  . Allergy   . Anxiety   . Cancer, metastatic to bone ([place]) 06/19/12   bx=L 5th ribmetastatic Adenocarcinoma with known lung mass  . Encounter for antineoplastic chemotherapy 02/14/2016  . External hemorrhoids   . Hyperlipidemia   . Hypertension   . Lung mass   . Radiation 07/11/12-07/24/12   Palliative lung tx 30 gray in 10 fx  . S/P radiation therapy 07/22/14-07/31/14   left lung/60gy/61f  . Status post chemoradiation    Tarceva  . Vitamin D deficiency     ALLERGIES:  is allergic to penicillins.  MEDICATIONS:  Current Outpatient Medications  Medication Sig Dispense Refill  . amLODipine (NORVASC) 5 MG tablet Take 2.5 mg by mouth daily.     . cholecalciferol (VITAMIN D) 1000 UNITS tablet Take 1,000 Units by mouth daily.    . CYANOCOBALAMIN PO Take 1,000 mcg by mouth daily.    .Marland KitchenFeFum-FePoly-FA-B Cmp-C-Biot (INTEGRA PLUS) CAPS Take 1 capsule by mouth every morning. 30 capsule 2  . osimertinib mesylate (TAGRISSO) 80 MG tablet Take 1 tablet (80 mg total) by mouth daily. 90 tablet 0  . potassium chloride (K-DUR) 10 MEQ tablet Take 1 tablet by mouth daily.     No current facility-administered medications for this visit.     SURGICAL HISTORY:  Past Surgical History:  Procedure Laterality Date  . APPENDECTOMY    . OVARIAN CYST REMOVAL    . SOFT TISSUE BIOPSY  06/19/12   L 5th rib=metastatic adenocarcinoma    REVIEW OF SYSTEMS:  A comprehensive review of systems was negative except for: Constitutional: positive for fatigue   PHYSICAL EXAMINATION: General appearance: alert, cooperative, fatigued and no distress Head: Normocephalic, without obvious abnormality, atraumatic Neck: no adenopathy, no JVD, supple, symmetrical, trachea midline and thyroid not enlarged, symmetric, no tenderness/mass/nodules Lymph nodes: Cervical, supraclavicular, and axillary nodes normal. Resp: clear to auscultation bilaterally Back: symmetric, no curvature. ROM normal. No CVA tenderness. Cardio: regular rate and rhythm, S1, S2 normal, no murmur, click, rub or gallop GI: soft, non-tender; bowel sounds normal; no masses,  no organomegaly Extremities: extremities normal, atraumatic, no cyanosis or edema  ECOG PERFORMANCE STATUS: 1 - Symptomatic but completely ambulatory  Blood pressure (!) 157/51, pulse 64, temperature 98 F (36.7 C), temperature source Oral, resp. rate 19, height 5' 2"  (1.575 m), weight 159 lb 1.6 oz (72.2 kg),  SpO2 100 %.  LABORATORY DATA: Lab Results  Component Value Date   WBC 4.3 12/03/2017   HGB 10.5 (L) 12/03/2017   HCT 33.1 (L) 12/03/2017   MCV 89.7 12/03/2017   PLT 87 (L) 12/03/2017      Chemistry      Component Value Date/Time   NA 140 12/03/2017 1424   NA 140 11/01/2017 1423   K 4.0 12/03/2017 1424   K 3.7 11/01/2017 1423   CL 107 12/03/2017 1424   CL 102 05/06/2013 1511   CO2 27 12/03/2017 1424   CO2 25 11/01/2017 1423   BUN 28 (H) 12/03/2017 1424   BUN 21.2 11/01/2017 1423   CREATININE 1.06 12/03/2017 1424   CREATININE 1.0 11/01/2017 1423      Component Value Date/Time   CALCIUM 9.2 12/03/2017 1424   CALCIUM 9.3 11/01/2017 1423   ALKPHOS 78 12/03/2017 1424   ALKPHOS 81 11/01/2017 1423   AST 22 12/03/2017 1424   AST 30 11/01/2017 1423   ALT 18 12/03/2017 1424   ALT 21 11/01/2017 1423   BILITOT 0.4 12/03/2017 1424   BILITOT 0.45 11/01/2017 1423       RADIOGRAPHIC STUDIES: US Pelvic Complete With Transvaginal  Result Date: 12/11/2017 CLINICAL DATA:  Fibroid EXAM: TRANSABDOMINAL AND TRANSVAGINAL ULTRASOUND OF PELVIS TECHNIQUE: Both transabdominal and transvaginal ultrasound examinations of the pelvis were performed. Transabdominal technique was performed for global imaging of the pelvis including uterus, ovaries, adnexal regions, and pelvic cul-de-sac. It was necessary to proceed with endovaginal exam following the transabdominal exam to visualize the endometrial complex and adnexal structures to an adequate degree. COMPARISON:  None FINDINGS: Uterus Measurements: 6.5 x 3.5 x 4.8 cm. Shadowing mass within the central uterus, likely submucosal position, measuring 2.4 x 2.3 cm compatible with calcified fibroid. Additional hyperechoic mass is seen within the anterior myometrium, possibly subserosal, measuring 1 x 0.6 cm, likely additional small fibroid. Complex cystic changes are seen within the fundal region of the endometrial complex, measuring 1.5 cm greatest dimension.  Endometrium Thickness: 1.5 cm, measured at the site of the complex cystic changes within the fundal region. Remainder of the endometrial complex is not well seen in its entirety due to the obscuring calcified fibroid. Right ovary Measurements: 2.1 x 1.6 x 1.6 cm. Dominant follicle versus small cyst within the right ovary measures 1.5 cm. Right ovary appears otherwise normal. No mass or free fluid within the adjacent right adnexal region. Left ovary Measurements: 2.2 x 1 x 1.1 cm.  Normal appearance/no adnexal mass. Other findings No abnormal free fluid. IMPRESSION: 1. Calcified fibroid within the central portion of the uterus, likely submucosal location, measuring 2.4 cm. The fibroid obscures a significant portion of the underlying endometrial complex. Probable small additional fibroid within the anterior myometrium, measuring 1 cm. 2. Complex cystic changes within the fundal region of the endometrial complex, of uncertain etiology but most likely benign, possibly sequela of the overlying fibroid. Consider follow-up pelvic ultrasound in 3-6 months to ensure stability. If abnormal postmenopausal bleeding, endometrial sampling would be recommended. 3. Ovaries are unremarkable. No mass or free fluid identified within either adnexal region. Electronically Signed   By: Franki Cabot M.D.   On: 12/11/2017 21:24  ASSESSMENT AND PLAN:  This is a very pleasant 72 years old Hispanic female with a stage IV non-small cell lung cancer, adenocarcinoma with positive EGFR mutation with deletion in exon 19 diagnosed in July 2013 status post 3 years treatment with Tarceva discontinued secondary to disease progression and development of T7 42 M EGFR resistant mutation. The patient was started on treatment with Tagrisso 80 mg by mouth daily status post 22 months.  She continues to tolerate this treatment well with no concerning complaints. I recommended for her to continue her treatment with Tagrisso. The ultrasound of the  pelvis showed calcified fibroid in the uterus.  I recommended for the patient to schedule an appointment with a gynecologist for further evaluation and recommendation regarding her condition. For hypertension, she was advised to take her blood pressure medication as prescribed and to monitor it closely at home. The patient was advised to call immediately if she has any concerning symptoms in the interval. All questions were answered. The patient knows to call the clinic with any problems, questions or concerns. We can certainly see the patient much sooner if necessary.  Disclaimer: This note was dictated with voice recognition software. Similar sounding words can inadvertently be transcribed and may not be corrected upon review.

## 2018-01-10 ENCOUNTER — Telehealth: Payer: Self-pay | Admitting: Pharmacy Technician

## 2018-01-10 NOTE — Telephone Encounter (Signed)
Oral Oncology Patient Advocate Encounter  Received a call from [place] and Me patient assistance program.    They are in need of a new prescription in order to process her next refill of Tagrisso.    This prescription must be faxed to [facility] and Me, with a cover sheet, at [phone].   Fabio Asa. Melynda Keller, [place] Patient [place] [phone] 01/10/2018 9:19 AM

## 2018-01-11 ENCOUNTER — Ambulatory Visit: Payer: Medicare Other | Admitting: Gynecology

## 2018-01-17 ENCOUNTER — Telehealth: Payer: Self-pay | Admitting: Medical Oncology

## 2018-01-17 ENCOUNTER — Other Ambulatory Visit: Payer: Self-pay | Admitting: Medical Oncology

## 2018-01-17 DIAGNOSIS — C3490 Malignant neoplasm of unspecified part of unspecified bronchus or lung: Secondary | ICD-10-CM

## 2018-01-17 MED ORDER — OSIMERTINIB MESYLATE 80 MG PO TABS: 80.0000 mg | ORAL_TABLET | Freq: Every day | ORAL | 0 refills | Status: DC

## 2018-01-17 NOTE — Telephone Encounter (Signed)
Needs refill faxed to [facility] and ME.-Done

## 2018-01-22 ENCOUNTER — Other Ambulatory Visit: Payer: Self-pay | Admitting: Medical Oncology

## 2018-01-22 ENCOUNTER — Telehealth: Payer: Self-pay | Admitting: Medical Oncology

## 2018-01-22 DIAGNOSIS — C3490 Malignant neoplasm of unspecified part of unspecified bronchus or lung: Secondary | ICD-10-CM

## 2018-01-22 MED ORDER — OSIMERTINIB MESYLATE 80 MG PO TABS: 80.0000 mg | ORAL_TABLET | Freq: Every day | ORAL | 2 refills | Status: DC

## 2018-01-22 MED ORDER — OSIMERTINIB MESYLATE 80 MG PO TABS: 80.0000 mg | ORAL_TABLET | Freq: Every day | ORAL | 0 refills | Status: DC

## 2018-01-22 NOTE — Telephone Encounter (Signed)
Has not received tagrisoo. Rx faxed last week. Called [place] and me. Wait time 20 minutes . Refaxed rx.

## 2018-01-22 NOTE — Telephone Encounter (Signed)
[place] requires a cover sheet with rx and can only be written for #30 and can write for 2 refills. Done and pt notified.

## 2018-01-22 NOTE — Progress Notes (Signed)
refaxed tagrisso to [place] and me.

## 2018-01-31 ENCOUNTER — Encounter: Payer: Self-pay | Admitting: Oncology

## 2018-01-31 ENCOUNTER — Inpatient Hospital Stay: Payer: Medicare Other | Attending: Internal Medicine

## 2018-01-31 ENCOUNTER — Inpatient Hospital Stay (HOSPITAL_BASED_OUTPATIENT_CLINIC_OR_DEPARTMENT_OTHER): Payer: Medicare Other | Admitting: Oncology

## 2018-01-31 ENCOUNTER — Telehealth: Payer: Self-pay | Admitting: Internal Medicine

## 2018-01-31 VITALS — BP 160/62 | HR 70 | Temp 98.3°F | Resp 16 | Ht 62.0 in | Wt 156.2 lb

## 2018-01-31 DIAGNOSIS — I10 Essential (primary) hypertension: Secondary | ICD-10-CM

## 2018-01-31 DIAGNOSIS — Z5111 Encounter for antineoplastic chemotherapy: Secondary | ICD-10-CM

## 2018-01-31 DIAGNOSIS — C3432 Malignant neoplasm of lower lobe, left bronchus or lung: Secondary | ICD-10-CM | POA: Diagnosis not present

## 2018-01-31 DIAGNOSIS — C7951 Secondary malignant neoplasm of bone: Secondary | ICD-10-CM

## 2018-01-31 DIAGNOSIS — C799 Secondary malignant neoplasm of unspecified site: Secondary | ICD-10-CM

## 2018-01-31 DIAGNOSIS — C3492 Malignant neoplasm of unspecified part of left bronchus or lung: Secondary | ICD-10-CM

## 2018-01-31 LAB — CMP (CANCER CENTER ONLY)
ALBUMIN: 3.2 g/dL — AB (ref 3.5–5.0)
ALT: 15 U/L (ref 0–55)
AST: 23 U/L (ref 5–34)
Alkaline Phosphatase: 68 U/L (ref 40–150)
Anion gap: 4 (ref 3–11)
BILIRUBIN TOTAL: 0.5 mg/dL (ref 0.2–1.2)
BUN: 23 mg/dL (ref 7–26)
CALCIUM: 9.5 mg/dL (ref 8.4–10.4)
CO2: 30 mmol/L — ABNORMAL HIGH (ref 22–29)
Chloride: 107 mmol/L (ref 98–109)
Creatinine: 0.86 mg/dL (ref 0.60–1.10)
GFR, Est AFR Am: >60 mL/min (ref >60)
GFR, Estimated: >60 mL/min (ref >60)
GLUCOSE: 111 mg/dL (ref 70–140)
POTASSIUM: 4.4 mmol/L (ref 3.5–5.1)
Sodium: 141 mmol/L (ref 136–145)
TOTAL PROTEIN: 7.4 g/dL (ref 6.4–8.3)

## 2018-01-31 LAB — CBC WITH DIFFERENTIAL (CANCER CENTER ONLY)
BASOS ABS: 0 10*3/uL (ref 0.0–0.1)
BASOS PCT: 0 %
Eosinophils Absolute: 0.1 10*3/uL (ref 0.0–0.5)
Eosinophils Relative: 2 %
HEMATOCRIT: 34.1 % — AB (ref 34.8–46.6)
Hemoglobin: 10.9 g/dL — ABNORMAL LOW (ref 11.6–15.9)
LYMPHS PCT: 14 %
Lymphs Abs: 0.7 10*3/uL — ABNORMAL LOW (ref 0.9–3.3)
MCH: 28.7 pg (ref 25.1–34.0)
MCHC: 32 g/dL (ref 31.5–36.0)
MCV: 89.7 fL (ref 79.5–101.0)
Monocytes Absolute: 0.4 10*3/uL (ref 0.1–0.9)
Monocytes Relative: 8 %
Neutro Abs: 3.9 10*3/uL (ref 1.5–6.5)
Neutrophils Relative %: 76 %
Platelet Count: 82 10*3/uL — ABNORMAL LOW (ref 145–400)
RBC: 3.8 MIL/uL (ref 3.70–5.45)
RDW: 14.4 % (ref 11.2–14.5)
WBC Count: 5.2 10*3/uL (ref 3.9–10.3)

## 2018-01-31 NOTE — Assessment & Plan Note (Signed)
This is a very pleasant 72 year old Hispanic female with a stage IV non-small cell lung cancer, adenocarcinoma with positive EGFR mutation with deletion in exon 19 diagnosed in July 2013 status post 3 years treatment with Tarceva discontinued secondary to disease progression and development of T7 61 M EGFR resistant mutation. The patient was started on treatment with Tagrisso 80 mg by mouth daily status post 23 months.  She continues to tolerate this treatment well with no concerning complaints. I recommended for her to continue her treatment with Tagrisso. The patient will have a restaging CT scan of the chest, abdomen, pelvis prior to her next visit. She will follow-up in 1 month to discuss her CT scan results.  She will keep her follow-up with gynecology as scheduled next week for evaluation of her uterine fibroid.    For hypertension, she was advised to take her blood pressure medication as prescribed and to monitor it closely at home.  The patient was advised to call immediately if she has any concerning symptoms in the interval. All questions were answered. The patient knows to call the clinic with any problems, questions or concerns. We can certainly see the patient much sooner if necessary.

## 2018-01-31 NOTE — Progress Notes (Signed)
[place] OFFICE PROGRESS NOTE  System, Provider Not In No address on file  DIAGNOSIS: Metastatic non-small cell lung cancer, adenocarcinoma with positive EGFR mutation in exon 19 and development of resistant T790M mutation. This was initially diagnosed in July 2013.  PRIOR THERAPY: 1) Status post radiotherapy to the left face rib metastasis under the care of Dr. Lisbeth Renshaw.  2) stereotactic radiotherapy to the enlarging left lower lobe lung nodule under the care of Dr. Lisbeth Renshaw. 3) treatment with Tarceva 150 mg by mouth daily, therapy beginning 07/20/2012. Status post approximately 36 months of therapy. This was discontinued secondary to disease progression  CURRENT THERAPY: 1) Tagrisso 80 mg by mouth daily started 02/03/2016 status post more than 22 months of treatment. 2) Xgeva 120 mg subcutaneously every 4 weeks for bone metastasis.  INTERVAL HISTORY: Jacqueline Leonard 72 y.o. female returns for routine follow-up visit by herself.  The patient is feeling fine today has no specific complaints.  The patient will be following up with gynecology next week for a fibroid of her uterus.  The patient denies fevers and chills.  Denies chest pain, shortness breath, cough, hemoptysis.  Denies nausea, vomiting, constipation, diarrhea.  No skin rashes.  She denies any recent weight loss or night sweats.  The patient is here for evaluation and repeat lab work.  MEDICAL HISTORY: Past Medical History:  Diagnosis Date  . Allergy   . Anxiety   . Cancer, metastatic to bone ([place]) 06/19/12   bx=L 5th ribmetastatic Adenocarcinoma with known lung mass  . Encounter for antineoplastic chemotherapy 02/14/2016  . External hemorrhoids   . Hyperlipidemia   . Hypertension   . Lung mass   . Radiation 07/11/12-07/24/12   Palliative lung tx 30 gray in 10 fx  . S/P radiation therapy 07/22/14-07/31/14   left lung/60gy/82f  . Status post chemoradiation    Tarceva  . Vitamin D deficiency     ALLERGIES:  is  allergic to penicillins.  MEDICATIONS:  Current Outpatient Medications  Medication Sig Dispense Refill  . amLODipine (NORVASC) 5 MG tablet Take 2.5 mg by mouth daily.     . cholecalciferol (VITAMIN D) 1000 UNITS tablet Take 1,000 Units by mouth daily.    . CYANOCOBALAMIN PO Take 1,000 mcg by mouth daily.    .Marland Kitchenosimertinib mesylate (TAGRISSO) 80 MG tablet Take 1 tablet (80 mg total) by mouth daily. 30 tablet 2  . FeFum-FePoly-FA-B Cmp-C-Biot (INTEGRA PLUS) CAPS Take 1 capsule by mouth every morning. (Patient not taking: Reported on 01/31/2018) 30 capsule 2  . potassium chloride (K-DUR) 10 MEQ tablet Take 1 tablet by mouth daily.     No current facility-administered medications for this visit.     SURGICAL HISTORY:  Past Surgical History:  Procedure Laterality Date  . APPENDECTOMY    . OVARIAN CYST REMOVAL    . SOFT TISSUE BIOPSY  06/19/12   L 5th rib=metastatic adenocarcinoma    REVIEW OF SYSTEMS:   Review of Systems  Constitutional: Negative for appetite change, chills, fatigue, fever and unexpected weight change.  HENT:   Negative for mouth sores, nosebleeds, sore throat and trouble swallowing.   Eyes: Negative for eye problems and icterus.  Respiratory: Negative for cough, hemoptysis, shortness of breath and wheezing.   Cardiovascular: Negative for chest pain and leg swelling.  Gastrointestinal: Negative for abdominal pain, constipation, diarrhea, nausea and vomiting.  Genitourinary: Negative for bladder incontinence, difficulty urinating, dysuria, frequency and hematuria.   Musculoskeletal: Negative for back pain, gait problem, neck pain  and neck stiffness.  Skin: Negative for itching and rash.  Neurological: Negative for dizziness, extremity weakness, gait problem, headaches, light-headedness and seizures.  Hematological: Negative for adenopathy. Does not bruise/bleed easily.  Psychiatric/Behavioral: Negative for confusion, depression and sleep disturbance. The patient is not  nervous/anxious.     PHYSICAL EXAMINATION:  Blood pressure (!) 160/62, pulse 70, temperature 98.3 F (36.8 C), temperature source Oral, resp. rate 16, height _0  (1.575 m), weight 156 lb 3.2 oz (70.9 kg), SpO2 100 %.  ECOG PERFORMANCE STATUS: 1 - Symptomatic but completely ambulatory  Physical Exam  Constitutional: Oriented to person, place, and time and well-developed, well-nourished, and in no distress. No distress.  HENT:  Head: Normocephalic and atraumatic.  Mouth/Throat: Oropharynx is clear and moist. No oropharyngeal exudate.  Eyes: Conjunctivae are normal. Right eye exhibits no discharge. Left eye exhibits no discharge. No scleral icterus.  Neck: Normal range of motion. Neck supple.  Cardiovascular: Normal rate, regular rhythm, normal heart sounds and intact distal pulses.   Pulmonary/Chest: Effort normal and breath sounds normal. No respiratory distress. No wheezes. No rales.  Abdominal: Soft. Bowel sounds are normal. Exhibits no distension and no mass. There is no tenderness.  Musculoskeletal: Normal range of motion. Exhibits no edema.  Lymphadenopathy:    No cervical adenopathy.  Neurological: Alert and oriented to person, place, and time. Exhibits normal muscle tone. Gait normal. Coordination normal.  Skin: Skin is warm and dry. No rash noted. Not diaphoretic. No erythema. No pallor.  Psychiatric: Mood, memory and judgment normal.  Vitals reviewed.  LABORATORY DATA: Lab Results  Component Value Date   WBC 5.2 01/31/2018   HGB 10.5 (L) 12/03/2017   HCT 34.1 (L) 01/31/2018   MCV 89.7 01/31/2018   PLT 82 (L) 01/31/2018      Chemistry      Component Value Date/Time   NA 141 01/31/2018 1312   NA 140 11/01/2017 1423   K 4.4 01/31/2018 1312   K 3.7 11/01/2017 1423   CL 107 01/31/2018 1312   CL 102 05/06/2013 1511   CO2 30 (H) 01/31/2018 1312   CO2 25 11/01/2017 1423   BUN 23 01/31/2018 1312   BUN 21.2 11/01/2017 1423   CREATININE 0.86 01/31/2018 1312    CREATININE 1.0 11/01/2017 1423      Component Value Date/Time   CALCIUM 9.5 01/31/2018 1312   CALCIUM 9.3 11/01/2017 1423   ALKPHOS 68 01/31/2018 1312   ALKPHOS 81 11/01/2017 1423   AST 23 01/31/2018 1312   AST 30 11/01/2017 1423   ALT 15 01/31/2018 1312   ALT 21 11/01/2017 1423   BILITOT 0.5 01/31/2018 1312   BILITOT 0.45 11/01/2017 1423       RADIOGRAPHIC STUDIES:  No results found.   ASSESSMENT/PLAN:  Adenocarcinoma of left lung, stage 4 ([facility]) This is a very pleasant 72 year old Hispanic female with a stage IV non-small cell lung cancer, adenocarcinoma with positive EGFR mutation with deletion in exon 19 diagnosed in July 2013 status post 3 years treatment with Tarceva discontinued secondary to disease progression and development of T7 54 M EGFR resistant mutation. The patient was started on treatment with Tagrisso 80 mg by mouth daily status post 23 months.  She continues to tolerate this treatment well with no concerning complaints. I recommended for her to continue her treatment with Tagrisso. The patient will have a restaging CT scan of the chest, abdomen, pelvis prior to her next visit. She will follow-up in 1 month to discuss  her CT scan results.  She will keep her follow-up with gynecology as scheduled next week for evaluation of her uterine fibroid.    For hypertension, she was advised to take her blood pressure medication as prescribed and to monitor it closely at home.  The patient was advised to call immediately if she has any concerning symptoms in the interval. All questions were answered. The patient knows to call the clinic with any problems, questions or concerns. We can certainly see the patient much sooner if necessary.  Orders Placed This Encounter  Procedures  . CT ABDOMEN PELVIS W CONTRAST    Standing Status:   Future    Standing Expiration Date:   02/01/2019    Order Specific Question:   If indicated for the ordered procedure, I authorize the  administration of contrast media per Radiology protocol    Answer:   Yes    Order Specific Question:   Preferred imaging location?    Answer:   [facility]    Order Specific Question:   Radiology Contrast Protocol - do NOT remove file path    Answer:   [email or web address]    Order Specific Question:   Reason for Exam additional comments    Answer:   Metastatic lung cancer. Restaging.  . CT CHEST W CONTRAST    Standing Status:   Future    Standing Expiration Date:   02/01/2019    Order Specific Question:   If indicated for the ordered procedure, I authorize the administration of contrast media per Radiology protocol    Answer:   Yes    Order Specific Question:   Preferred imaging location?    Answer:   [facility]    Order Specific Question:   Radiology Contrast Protocol - do NOT remove file path    Answer:   [email or web address]    Order Specific Question:   Reason for Exam additional comments    Answer:   Metastatic lung cancer. Restaging.  Marland Kitchen CBC with Differential (Cancer Center Only)    Standing Status:   Future    Standing Expiration Date:   02/01/2019  . CMP ([place] only)    Standing Status:   Future    Standing Expiration Date:   02/01/2019   Mikey Bussing, DNP, AGPCNP-BC, AOCNP 01/31/18

## 2018-01-31 NOTE — Telephone Encounter (Signed)
Appointments scheduled AVS?[place]enda printed/ Contrast material was given per 3/7 los

## 2018-02-06 ENCOUNTER — Ambulatory Visit (INDEPENDENT_AMBULATORY_CARE_PROVIDER_SITE_OTHER): Payer: Medicare Other | Admitting: Family Medicine

## 2018-02-06 ENCOUNTER — Other Ambulatory Visit: Payer: Self-pay

## 2018-02-06 ENCOUNTER — Encounter: Payer: Self-pay | Admitting: Family Medicine

## 2018-02-06 VITALS — BP 150/62 | HR 80 | Temp 98.5°F | Ht 61.81 in | Wt 155.4 lb

## 2018-02-06 DIAGNOSIS — E785 Hyperlipidemia, unspecified: Secondary | ICD-10-CM | POA: Diagnosis not present

## 2018-02-06 DIAGNOSIS — C799 Secondary malignant neoplasm of unspecified site: Secondary | ICD-10-CM | POA: Diagnosis not present

## 2018-02-06 DIAGNOSIS — I10 Essential (primary) hypertension: Secondary | ICD-10-CM

## 2018-02-06 MED ORDER — AMLODIPINE BESYLATE 2.5 MG PO TABS: 2.5000 mg | ORAL_TABLET | Freq: Every day | ORAL | 3 refills | Status: DC

## 2018-02-06 NOTE — Patient Instructions (Signed)
     IF you received an x-ray today, you will receive an invoice from [facility]. Please contact [facility] at [phone] with questions or concerns regarding your invoice.   IF you received labwork today, you will receive an invoice from [facility]. Please contact [facility] at [phone] with questions or concerns regarding your invoice.   Our billing staff will not be able to assist you with questions regarding bills from these companies.  You will be contacted with the lab results as soon as they are available. The fastest way to get your results is to activate your My Chart account. Instructions are located on the last page of this paperwork. If you have not heard from us regarding the results in 2 weeks, please contact this office.     

## 2018-02-06 NOTE — Progress Notes (Signed)
3/13/20195:36 PM  Jaxon Mynhier 10/19/1946, 72 y.o. female [id number]  Chief Complaint  Patient presents with  . Hypertension    Looking to est care with pcp. Says that she just started bk taking bp meds due to home cuff showing diastolic too low. Has been bk on meds for 2 days now. Needing refill    HPI:   Patient is a 72 y.o. female with past medical history significant for metastatic lung cancer, HTN and HLP who presents today to establish care  Metastatic bone cancer stable with current regime, last onc appt march 2019 CBC and CMP done during that visit were stable with normal kidney function She has been on amlodipine for over a year now, stopped it several months ago due to persistent low diastolics - in the 25K - that were symptomatic. However her systolic BP has slowly been increasing and 2 days ago started 2.5mg  amlodpine once a day She has a home BP cuff, measures same as clinic Patient requesting cholesterol be checked today Has no acute concerns today  Depression screen [facility] 2/9 02/06/2018 09/09/2015 03/18/2015  Decreased Interest 0 0 0  Down, Depressed, Hopeless 0 0 0  PHQ - 2 Score 0 0 0  Some recent data might be hidden    Allergies  Allergen Reactions  . Penicillins Palpitations and Other (See Comments)    Has patient had a PCN reaction causing immediate rash, facial/tongue/throat swelling, SOB or lightheadedness with hypotension: No Has patient had a PCN reaction causing severe rash involving mucus membranes or skin necrosis: No Has patient had a PCN reaction that required hospitalization No Has patient had a PCN reaction occurring within the last 10 years: Yes If all of the above answers are "NO", then may proceed with Cephalosporin use.    Prior to Admission medications   Medication Sig Start Date End Date Taking? Authorizing Provider  amLODipine (NORVASC) 5 MG tablet Take 2.5 mg by mouth daily.    Yes [provider]  cholecalciferol (VITAMIN  D) 1000 UNITS tablet Take 1,000 Units by mouth daily.   Yes [provider]  CYANOCOBALAMIN PO Take 1,000 mcg by mouth daily.   Yes [provider]  FeFum-FePoly-FA-B Cmp-C-Biot (INTEGRA PLUS) CAPS Take 1 capsule by mouth every morning. 03/12/17  Yes Curt Bears, MD  osimertinib mesylate (TAGRISSO) 80 MG tablet Take 1 tablet (80 mg total) by mouth daily. 01/22/18  Yes Curt Bears, MD  potassium chloride (K-DUR) 10 MEQ tablet Take 1 tablet by mouth daily. 03/13/14  Yes [provider]    Past Medical History:  Diagnosis Date  . Allergy   . Anxiety   . Cancer, metastatic to bone ([place]) 06/19/12   bx=L 5th ribmetastatic Adenocarcinoma with known lung mass  . Encounter for antineoplastic chemotherapy 02/14/2016  . External hemorrhoids   . Hyperlipidemia   . Hypertension   . Lung mass   . Radiation 07/11/12-07/24/12   Palliative lung tx 30 gray in 10 fx  . S/P radiation therapy 07/22/14-07/31/14   left lung/60gy/62fx  . Status post chemoradiation    Tarceva  . Vitamin D deficiency     Past Surgical History:  Procedure Laterality Date  . APPENDECTOMY    . OVARIAN CYST REMOVAL    . SOFT TISSUE BIOPSY  06/19/12   L 5th rib=metastatic adenocarcinoma    Social History   Tobacco Use  . Smoking status: Never Smoker  . Smokeless tobacco: Never Used  Substance Use Topics  . Alcohol use:  No    Family History  Problem Relation Age of Onset  . Breast cancer Sister   . Breast cancer Sister   . Lung cancer Brother     Review of Systems  Constitutional: Negative for chills, fever, malaise/fatigue and weight loss.  HENT: Negative for congestion, ear pain and sore throat.   Eyes: Negative for blurred vision and double vision.  Respiratory: Negative for cough, hemoptysis and shortness of breath.   Cardiovascular: Negative for chest pain, palpitations and leg swelling.  Gastrointestinal: Positive for constipation (occ, manages with OTC tea) and heartburn  (occassional). Negative for abdominal pain, blood in stool, diarrhea, melena, nausea and vomiting.  Genitourinary: Negative for dysuria, frequency, hematuria and urgency.  Musculoskeletal: Negative for back pain and falls.  Neurological: Negative for dizziness, tingling, focal weakness and headaches.  Endo/Heme/Allergies: Does not bruise/bleed easily.  Psychiatric/Behavioral: Negative for depression and memory loss. The patient is not nervous/anxious.      OBJECTIVE:  Blood pressure (!) 150/62, pulse 80, temperature 98.5 F (36.9 C), temperature source Oral, height 5' 1.81" (1.57 m), weight 155 lb 6.4 oz (70.5 kg), SpO2 98 %.  Physical Exam  Constitutional: She is oriented to person, place, and time and well-developed, well-nourished, and in no distress.  HENT:  Head: Normocephalic and atraumatic.  Mouth/Throat: Oropharynx is clear and moist. No oropharyngeal exudate.  Eyes: EOM are normal. Pupils are equal, round, and reactive to light. No scleral icterus.  Neck: Neck supple.  Cardiovascular: Normal rate, regular rhythm and normal heart sounds. Exam reveals no gallop and no friction rub.  No murmur heard. Pulmonary/Chest: Effort normal and breath sounds normal. She has no wheezes. She has no rales.  Musculoskeletal: She exhibits edema ( pitting, ankle, + varicose veins).  Neurological: She is alert and oriented to person, place, and time. Gait normal.  Skin: Skin is warm and dry.    ASSESSMENT and PLAN  1. Essential hypertension Cont home BP monitoring. Recheck BP at next visit. - amLODipine (NORVASC) 2.5 MG tablet; Take 1 tablet (2.5 mg total) by mouth daily. - TSH  2. Metastatic adenocarcinoma, pathology 06/19/12 Per med onc, has surveillance scans pending.  3. Hyperlipidemia, unspecified hyperlipidemia type - Lipid panel - TSH   Return in about 4 weeks (around 03/06/2018).    Rutherford Guys, MD [facility], [place] [address] Ph.   [phone] Fax [phone]  .ims

## 2018-02-07 LAB — LIPID PANEL
Chol/HDL Ratio: 3.4 ratio (ref 0.0–4.4)
Cholesterol, Total: 198 mg/dL (ref 100–199)
HDL: 58 mg/dL (ref >39)
LDL Calculated: 124 mg/dL — ABNORMAL HIGH (ref 0–99)
Triglycerides: 81 mg/dL (ref 0–149)
VLDL Cholesterol Cal: 16 mg/dL (ref 5–40)

## 2018-02-08 ENCOUNTER — Encounter: Payer: Self-pay | Admitting: Gynecology

## 2018-02-08 ENCOUNTER — Ambulatory Visit (INDEPENDENT_AMBULATORY_CARE_PROVIDER_SITE_OTHER): Payer: Medicare Other | Admitting: Gynecology

## 2018-02-08 VITALS — BP 118/76 | Ht 61.0 in | Wt 156.0 lb

## 2018-02-08 DIAGNOSIS — Z124 Encounter for screening for malignant neoplasm of cervix: Secondary | ICD-10-CM | POA: Diagnosis not present

## 2018-02-08 DIAGNOSIS — N859 Noninflammatory disorder of uterus, unspecified: Secondary | ICD-10-CM

## 2018-02-08 DIAGNOSIS — N949 Unspecified condition associated with female genital organs and menstrual cycle: Secondary | ICD-10-CM | POA: Diagnosis not present

## 2018-02-08 DIAGNOSIS — D259 Leiomyoma of uterus, unspecified: Secondary | ICD-10-CM

## 2018-02-08 NOTE — Progress Notes (Signed)
    Jacqueline Leonard 09-10-1946 [id number]        72 y.o.  G0P0000 new patient seen in consultation from Dr. Julien Nordmann where CT scanning of her abdomen in follow-up of her stage IV lung cancer showed uterine fibroid as well as an endometrial thickening up to 1.1 cm.  The patient had a follow-up ultrasound which showed the endometrium 1.5 cm with a complex cystic area in the fundal region of the uterus.  Patient was referred for further evaluation.  She has done no bleeding.  No vaginal discharge, irritation or odor.  She has no history of gynecologic issues in the past to include no abnormal Pap smears by her history.  She receives regular annual mammograms but is unclear when her last Pap smear was.    Past medical history,surgical history, problem list, medications, allergies, family history and social history were all reviewed and documented in the EPIC chart.  Directed ROS with pertinent positives and negatives documented in the history of present illness/assessment and plan.  Exam: Caryn Bee assistant Vitals:   02/08/18 1145  BP: 118/76  Weight: 156 lb (70.8 kg)  Height: 5\' 1"  (1.549 m)   General appearance:  Normal HEENT normal Both breasts examined lying and sitting without masses retractions discharge adenopathy. Abdomen soft nontender without masses guarding rebound Pelvic external BUS vagina with atrophic changes.  Cervix with atrophic changes.  Pap smear done.  Uterus grossly normal midline mobile nontender.  Adnexa without masses or tenderness.   Assessment/Plan:  72 y.o. G0P0000 with incidental findings of fibroid and complex area in the endometrium on CT scanning and subsequent ultrasound.  No history of bleeding or other gynecologic complaints.  I reviewed with the patient the issues of leiomyoma as well as the questionable complex area in the endometrium.  The differential discussed to include debris, normal endometrium, hyperplasia, polyps in the issue of endometrial cancer  reviewed.  The question in her clinical situation as to how far/involved evaluation is necessary at this time was also reviewed.  My recommendation is to start with a sonohysterogram for endometrial assessment and possible biopsy and then to go from there.  Various scenarios to include no further intervention, serial ultrasound follow-up up to and including hysteroscopy were reviewed.  A communication follow-up was sent to Dr. Curt Bears  Greater than 50% of my time was spent in direct face to face counseling and coordination of care with the patient.     Anastasio Auerbach MD, 12:57 PM 02/08/2018

## 2018-02-08 NOTE — Patient Instructions (Signed)
Follow-up for the ultrasound as scheduled. 

## 2018-02-08 NOTE — Addendum Note (Signed)
Addended by: Nelva Nay on: 02/08/2018 01:07 PM   Modules accepted: Orders

## 2018-02-12 NOTE — Addendum Note (Signed)
Addended by: Anastasio Auerbach on: 02/12/2018 08:00 AM   Modules accepted: Orders

## 2018-02-13 ENCOUNTER — Telehealth: Payer: Self-pay | Admitting: Family Medicine

## 2018-02-13 LAB — PAP IG W/ RFLX HPV ASCU

## 2018-02-13 NOTE — Telephone Encounter (Signed)
Called pt yesterday 4-19 and rescheduled her appt to 4/11 at 1:20 pm. Advised pt of building number, time requests and late policy

## 2018-02-14 ENCOUNTER — Telehealth: Payer: Self-pay | Admitting: Medical Oncology

## 2018-02-14 NOTE — Telephone Encounter (Signed)
Faxed med list , last note to [facility].

## 2018-02-27 ENCOUNTER — Ambulatory Visit (HOSPITAL_COMMUNITY)
Admission: RE | Admit: 2018-02-27 | Discharge: 2018-02-27 | Disposition: A | Discharge status: Home / Self Care | Payer: Medicare Other | Source: Ambulatory Visit | Attending: Oncology | Admitting: Oncology

## 2018-02-27 ENCOUNTER — Encounter (HOSPITAL_COMMUNITY): Payer: Self-pay

## 2018-02-27 ENCOUNTER — Inpatient Hospital Stay: Payer: Medicare Other | Attending: Internal Medicine

## 2018-02-27 DIAGNOSIS — I700 Atherosclerosis of aorta: Secondary | ICD-10-CM | POA: Insufficient documentation

## 2018-02-27 DIAGNOSIS — K766 Portal hypertension: Secondary | ICD-10-CM | POA: Insufficient documentation

## 2018-02-27 DIAGNOSIS — M48061 Spinal stenosis, lumbar region without neurogenic claudication: Secondary | ICD-10-CM | POA: Diagnosis not present

## 2018-02-27 DIAGNOSIS — R918 Other nonspecific abnormal finding of lung field: Secondary | ICD-10-CM | POA: Diagnosis not present

## 2018-02-27 DIAGNOSIS — I8500 Esophageal varices without bleeding: Secondary | ICD-10-CM | POA: Insufficient documentation

## 2018-02-27 DIAGNOSIS — M899 Disorder of bone, unspecified: Secondary | ICD-10-CM | POA: Diagnosis not present

## 2018-02-27 DIAGNOSIS — C3492 Malignant neoplasm of unspecified part of left bronchus or lung: Secondary | ICD-10-CM | POA: Diagnosis not present

## 2018-02-27 DIAGNOSIS — I517 Cardiomegaly: Secondary | ICD-10-CM | POA: Diagnosis not present

## 2018-02-27 DIAGNOSIS — K7460 Unspecified cirrhosis of liver: Secondary | ICD-10-CM | POA: Insufficient documentation

## 2018-02-27 DIAGNOSIS — R05 Cough: Secondary | ICD-10-CM | POA: Insufficient documentation

## 2018-02-27 DIAGNOSIS — C7951 Secondary malignant neoplasm of bone: Secondary | ICD-10-CM | POA: Insufficient documentation

## 2018-02-27 DIAGNOSIS — J90 Pleural effusion, not elsewhere classified: Secondary | ICD-10-CM | POA: Insufficient documentation

## 2018-02-27 DIAGNOSIS — C3432 Malignant neoplasm of lower lobe, left bronchus or lung: Secondary | ICD-10-CM | POA: Insufficient documentation

## 2018-02-27 DIAGNOSIS — I10 Essential (primary) hypertension: Secondary | ICD-10-CM | POA: Diagnosis not present

## 2018-02-27 DIAGNOSIS — D259 Leiomyoma of uterus, unspecified: Secondary | ICD-10-CM | POA: Diagnosis not present

## 2018-02-27 DIAGNOSIS — C3490 Malignant neoplasm of unspecified part of unspecified bronchus or lung: Secondary | ICD-10-CM | POA: Diagnosis not present

## 2018-02-27 LAB — CBC WITH DIFFERENTIAL (CANCER CENTER ONLY)
Basophils Absolute: 0 10*3/uL (ref 0.0–0.1)
Basophils Relative: 0 %
Eosinophils Absolute: 0.1 10*3/uL (ref 0.0–0.5)
Eosinophils Relative: 2 %
HEMATOCRIT: 34.9 % (ref 34.8–46.6)
HEMOGLOBIN: 11.2 g/dL — AB (ref 11.6–15.9)
LYMPHS ABS: 0.6 10*3/uL — AB (ref 0.9–3.3)
LYMPHS PCT: 15 %
MCH: 28.1 pg (ref 25.1–34.0)
MCHC: 32 g/dL (ref 31.5–36.0)
MCV: 87.8 fL (ref 79.5–101.0)
Monocytes Absolute: 0.4 10*3/uL (ref 0.1–0.9)
Monocytes Relative: 10 %
NEUTROS PCT: 73 %
Neutro Abs: 3 10*3/uL (ref 1.5–6.5)
Platelet Count: 96 10*3/uL — ABNORMAL LOW (ref 145–400)
RBC: 3.98 MIL/uL (ref 3.70–5.45)
RDW: 15.6 % — ABNORMAL HIGH (ref 11.2–14.5)
WBC: 4.1 10*3/uL (ref 3.9–10.3)

## 2018-02-27 LAB — CMP (CANCER CENTER ONLY)
ALK PHOS: 75 U/L (ref 40–150)
ALT: 17 U/L (ref 0–55)
AST: 22 U/L (ref 5–34)
Albumin: 3.2 g/dL — ABNORMAL LOW (ref 3.5–5.0)
Anion gap: 7 (ref 3–11)
BILIRUBIN TOTAL: 0.5 mg/dL (ref 0.2–1.2)
BUN: 25 mg/dL (ref 7–26)
CALCIUM: 9.4 mg/dL (ref 8.4–10.4)
CO2: 28 mmol/L (ref 22–29)
CREATININE: 0.83 mg/dL (ref 0.60–1.10)
Chloride: 107 mmol/L (ref 98–109)
Glucose, Bld: 82 mg/dL (ref 70–140)
Potassium: 3.9 mmol/L (ref 3.5–5.1)
Sodium: 142 mmol/L (ref 136–145)
TOTAL PROTEIN: 7.5 g/dL (ref 6.4–8.3)

## 2018-02-27 MED ORDER — IOPAMIDOL (ISOVUE-300) INJECTION 61%
100.0000 mL | Freq: Once | INTRAVENOUS | Status: AC | PRN
  Administered 2018-02-27: 100 mL via INTRAVENOUS

## 2018-02-27 MED ORDER — IOPAMIDOL (ISOVUE-300) INJECTION 61%
INTRAVENOUS | Status: AC
  Filled 2018-02-27: qty 100

## 2018-03-05 ENCOUNTER — Ambulatory Visit: Payer: Medicare Other | Admitting: Family Medicine

## 2018-03-05 ENCOUNTER — Inpatient Hospital Stay (HOSPITAL_BASED_OUTPATIENT_CLINIC_OR_DEPARTMENT_OTHER): Payer: Medicare Other | Admitting: Internal Medicine

## 2018-03-05 ENCOUNTER — Encounter: Payer: Self-pay | Admitting: Internal Medicine

## 2018-03-05 ENCOUNTER — Telehealth: Payer: Self-pay

## 2018-03-05 ENCOUNTER — Other Ambulatory Visit: Payer: Self-pay | Admitting: Gynecology

## 2018-03-05 VITALS — BP 159/50 | HR 74 | Temp 97.8°F | Resp 18 | Ht 61.0 in | Wt 155.8 lb

## 2018-03-05 DIAGNOSIS — C7951 Secondary malignant neoplasm of bone: Secondary | ICD-10-CM

## 2018-03-05 DIAGNOSIS — N950 Postmenopausal bleeding: Secondary | ICD-10-CM

## 2018-03-05 DIAGNOSIS — I10 Essential (primary) hypertension: Secondary | ICD-10-CM | POA: Diagnosis not present

## 2018-03-05 DIAGNOSIS — R05 Cough: Secondary | ICD-10-CM | POA: Diagnosis not present

## 2018-03-05 DIAGNOSIS — C799 Secondary malignant neoplasm of unspecified site: Secondary | ICD-10-CM

## 2018-03-05 DIAGNOSIS — N858 Other specified noninflammatory disorders of uterus: Secondary | ICD-10-CM

## 2018-03-05 DIAGNOSIS — C3432 Malignant neoplasm of lower lobe, left bronchus or lung: Secondary | ICD-10-CM | POA: Diagnosis not present

## 2018-03-05 DIAGNOSIS — D251 Intramural leiomyoma of uterus: Secondary | ICD-10-CM

## 2018-03-05 DIAGNOSIS — R9389 Abnormal findings on diagnostic imaging of other specified body structures: Secondary | ICD-10-CM

## 2018-03-05 NOTE — Progress Notes (Signed)
[place] [phone]   Fax:([phone]) [place], [place] [address]  DIAGNOSIS: Metastatic non-small cell lung cancer, adenocarcinoma with positive EGFR mutation in exon 19 and development of resistant T790M mutation. This was initially diagnosed in July 2013.  PRIOR THERAPY: 1) Status post radiotherapy to the left face rib metastasis under the care of Dr. Lisbeth Renshaw.  2) stereotactic radiotherapy to the enlarging left lower lobe lung nodule under the care of Dr. Lisbeth Renshaw. 3) treatment with Tarceva 150 mg by mouth daily, therapy beginning 07/20/2012. Status post approximately 36 months of therapy. This was discontinued secondary to disease progression  CURRENT THERAPY: 1) Tagrisso 80 mg by mouth daily started 02/03/2016 status post more than 25 months of treatment. 2) Xgeva 120 mg subcutaneously every 4 weeks for bone metastasis.  INTERVAL HISTORY: Jacqueline Leonard 72 y.o. female returns to the clinic today for follow-up visit accompanied by her niece [name].  The patient is feeling fine today with no specific complaints except for cough productive of yellowish sputum.  She denied having any recent fever or chills.  She has no nausea, vomiting, diarrhea or constipation.  She denied having any weight loss or night sweats.  The patient denied having any significant chest pain shortness of breath or hemoptysis.  She continues to tolerate her treatment with Tagrisso fairly well.  She had repeat CT scan of the chest, abdomen and pelvis performed recently and she is here for evaluation and discussion of the scan results.  MEDICAL HISTORY: Past Medical History:  Diagnosis Date  . Allergy   . Anxiety   . Cancer, metastatic to bone ([place]) 06/19/12   bx=L 5th ribmetastatic Adenocarcinoma with known lung mass  . Encounter for antineoplastic chemotherapy 02/14/2016  . External hemorrhoids   . Hyperlipidemia     . Hypertension   . Lung mass   . Radiation 07/11/12-07/24/12   Palliative lung tx 30 gray in 10 fx  . S/P radiation therapy 07/22/14-07/31/14   left lung/60gy/54f  . Status post chemoradiation    Tarceva  . Vitamin D deficiency     ALLERGIES:  is allergic to penicillins.  MEDICATIONS:  Current Outpatient Medications  Medication Sig Dispense Refill  . amLODipine (NORVASC) 2.5 MG tablet Take 1 tablet (2.5 mg total) by mouth daily. 30 tablet 3  . cholecalciferol (VITAMIN D) 1000 UNITS tablet Take 1,000 Units by mouth daily.    . CYANOCOBALAMIN PO Take 1,000 mcg by mouth daily.    .Marland KitchenFeFum-FePoly-FA-B Cmp-C-Biot (INTEGRA PLUS) CAPS Take 1 capsule by mouth every morning. 30 capsule 2  . osimertinib mesylate (TAGRISSO) 80 MG tablet Take 1 tablet (80 mg total) by mouth daily. 30 tablet 2  . potassium chloride (K-DUR) 10 MEQ tablet Take 1 tablet by mouth daily.     No current facility-administered medications for this visit.     SURGICAL HISTORY:  Past Surgical History:  Procedure Laterality Date  . APPENDECTOMY    . OVARIAN CYST REMOVAL    . SOFT TISSUE BIOPSY  06/19/12   L 5th rib=metastatic adenocarcinoma    REVIEW OF SYSTEMS:  Constitutional: negative Eyes: negative Ears, nose, mouth, throat, and face: negative Respiratory: positive for cough and sputum Cardiovascular: negative Gastrointestinal: negative Genitourinary:negative Integument/breast: negative Hematologic/lymphatic: negative Musculoskeletal:negative Neurological: negative Behavioral/Psych: negative Endocrine: negative Allergic/Immunologic: negative   PHYSICAL EXAMINATION: General appearance: alert, cooperative and no distress Head: Normocephalic, without obvious abnormality, atraumatic Neck: no adenopathy,  no JVD, supple, symmetrical, trachea midline and thyroid not enlarged, symmetric, no tenderness/mass/nodules Lymph nodes: Cervical, supraclavicular, and axillary nodes normal. Resp: clear to auscultation  bilaterally Back: symmetric, no curvature. ROM normal. No CVA tenderness. Cardio: regular rate and rhythm, S1, S2 normal, no murmur, click, rub or gallop GI: soft, non-tender; bowel sounds normal; no masses,  no organomegaly Extremities: extremities normal, atraumatic, no cyanosis or edema Neurologic: Alert and oriented X 3, normal strength and tone. Normal symmetric reflexes. Normal coordination and gait  ECOG PERFORMANCE STATUS: 1 - Symptomatic but completely ambulatory  Blood pressure (!) 159/50, pulse 74, temperature 97.8 F (36.6 C), temperature source Oral, resp. rate 18, height '5\' 1"'$  (1.549 m), weight 155 lb 12.8 oz (70.7 kg), SpO2 98 %.  LABORATORY DATA: Lab Results  Component Value Date   WBC 4.1 02/27/2018   HGB 10.5 (L) 12/03/2017   HCT 34.9 02/27/2018   MCV 87.8 02/27/2018   PLT 96 (L) 02/27/2018      Chemistry      Component Value Date/Time   NA 142 02/27/2018 1324   NA 140 11/01/2017 1423   K 3.9 02/27/2018 1324   K 3.7 11/01/2017 1423   CL 107 02/27/2018 1324   CL 102 05/06/2013 1511   CO2 28 02/27/2018 1324   CO2 25 11/01/2017 1423   BUN 25 02/27/2018 1324   BUN 21.2 11/01/2017 1423   CREATININE 0.83 02/27/2018 1324   CREATININE 1.0 11/01/2017 1423      Component Value Date/Time   CALCIUM 9.4 02/27/2018 1324   CALCIUM 9.3 11/01/2017 1423   ALKPHOS 75 02/27/2018 1324   ALKPHOS 81 11/01/2017 1423   AST 22 02/27/2018 1324   AST 30 11/01/2017 1423   ALT 17 02/27/2018 1324   ALT 21 11/01/2017 1423   BILITOT 0.5 02/27/2018 1324   BILITOT 0.45 11/01/2017 1423       RADIOGRAPHIC STUDIES: Ct Chest W Contrast  Result Date: 02/27/2018 CLINICAL DATA:  Metastatic lung cancer, restaging assessment. Cough for 2 weeks. EXAM: CT CHEST, ABDOMEN, AND PELVIS WITH CONTRAST TECHNIQUE: Multidetector CT imaging of the chest, abdomen and pelvis was performed following the standard protocol during bolus administration of intravenous contrast. CONTRAST:  180m ISOVUE-300  IOPAMIDOL (ISOVUE-300) INJECTION 61% COMPARISON:  Multiple exams, including 11/30/2017 FINDINGS: CT CHEST FINDINGS Cardiovascular: Aortic arch and branch vessel atherosclerotic vascular disease with tortuosity of the branch vessels. Moderate four-chamber cardiomegaly. Mediastinum/Nodes: No pathologic thoracic adenopathy identified. Uphill varices adjacent to the distal esophagus. Lungs/Pleura: Further progression in bilateral ill-defined nodular opacities in both lungs which have enlarged. For example the previous 1.3 by 1.9 cm opacity in the right upper lobe currently measures 2.1 by 2.5 cm. The previous 0.6 by 0.7 cm right lower lobe pulmonary nodule currently measures 1.2 by 0.8 cm on image 86/5. There has been commensurate enlargement in most of the scattered pulmonary nodules in both lungs, some of which are ill-defined. In addition, there is new bilateral mosaic attenuation in the lungs with some worsening more dependently, suggesting pulmonary edema as the cause. Continued consolidation in the infrahilar portion of the left lower lobe. Nodularity along the posterior margin of the consolidated and atelectatic lung measures 2.5 by 1.5 cm on image 23/3, formerly 2.3 by 1.3 cm. There is some enhancement along the visceral pleural margin of the moderate-sized left pleural effusion which is mildly increased in size compared to the prior exam. There are mildly loculated components of the pleural effusion laterally. Musculoskeletal: Expansile and primarily sclerotic lesion  of the left fourth rib laterally similar to prior, with adjacent loculated pleural effusion. Suspected vertebral hemangiomatosis. CT ABDOMEN PELVIS FINDINGS Hepatobiliary: Faint heterogeneity in the liver with slightly accentuated subcapsular enhancement favoring passive congestion. Suspected early cirrhosis. Gallbladder unremarkable. Pancreas: Unremarkable Spleen: Unremarkable Adrenals/Urinary Tract: Stable left kidney lower pole benign-appearing  cyst. Adrenal glands unremarkable. No renal calculi or abnormal renal enhancement identified. Stomach/Bowel: Unremarkable Vascular/Lymphatic: Recanalized umbilical vein and small gastric varices indicating portal venous hypertension. Aortoiliac atherosclerotic vascular disease. Reproductive: Retroverted uterus. 2.6 cm calcified fibroid centrally in the uterus. Adnexa unremarkable. Other: No supplemental non-categorized findings. Musculoskeletal: Chronic bilateral pars defects at L5 with 8 mm of anterolisthesis and borderline resulting bilateral foraminal impingement. Suspected multilevel hemangiomas in the lumbar spine. IMPRESSION: 1. Further increase in size of indistinctly marginated bilateral pulmonary nodules, suspicious for malignancy. The left lower lobe lesion along the posterior margin of the left lower lobe consolidation likewise appears mildly larger. 2. There is new mosaic attenuation in the lungs favored to be from low-grade edema, and most notable in the upper lobes. 3. Mild increase in size of the mildly loculated left pleural effusion which is probably exudative given the visceral pleural enhancement. Hepatic cirrhosis with portal venous hypertension, with recanalized umbilical vein and uphill varices adjacent to the distal esophagus. 4. Stable expansile and primarily sclerotic lesion of the left fourth rib laterally. There are scattered hypodense lesions in the thoracolumbar spine which are most characteristic for hemangiomas and which likewise appear stable. 5. Other imaging findings of potential clinical significance: Aortic Atherosclerosis (ICD10-I70.0). Moderate four-chamber cardiomegaly. Uterine fibroid. Chronic pars defects at L5 with 8 mm anterolisthesis and borderline bilateral foraminal stenosis. Electronically Signed   By: Van Clines M.D.   On: 02/27/2018 16:59   Ct Abdomen Pelvis W Contrast  Result Date: 02/27/2018 CLINICAL DATA:  Metastatic lung cancer, restaging assessment.  Cough for 2 weeks. EXAM: CT CHEST, ABDOMEN, AND PELVIS WITH CONTRAST TECHNIQUE: Multidetector CT imaging of the chest, abdomen and pelvis was performed following the standard protocol during bolus administration of intravenous contrast. CONTRAST:  17m ISOVUE-300 IOPAMIDOL (ISOVUE-300) INJECTION 61% COMPARISON:  Multiple exams, including 11/30/2017 FINDINGS: CT CHEST FINDINGS Cardiovascular: Aortic arch and branch vessel atherosclerotic vascular disease with tortuosity of the branch vessels. Moderate four-chamber cardiomegaly. Mediastinum/Nodes: No pathologic thoracic adenopathy identified. Uphill varices adjacent to the distal esophagus. Lungs/Pleura: Further progression in bilateral ill-defined nodular opacities in both lungs which have enlarged. For example the previous 1.3 by 1.9 cm opacity in the right upper lobe currently measures 2.1 by 2.5 cm. The previous 0.6 by 0.7 cm right lower lobe pulmonary nodule currently measures 1.2 by 0.8 cm on image 86/5. There has been commensurate enlargement in most of the scattered pulmonary nodules in both lungs, some of which are ill-defined. In addition, there is new bilateral mosaic attenuation in the lungs with some worsening more dependently, suggesting pulmonary edema as the cause. Continued consolidation in the infrahilar portion of the left lower lobe. Nodularity along the posterior margin of the consolidated and atelectatic lung measures 2.5 by 1.5 cm on image 23/3, formerly 2.3 by 1.3 cm. There is some enhancement along the visceral pleural margin of the moderate-sized left pleural effusion which is mildly increased in size compared to the prior exam. There are mildly loculated components of the pleural effusion laterally. Musculoskeletal: Expansile and primarily sclerotic lesion of the left fourth rib laterally similar to prior, with adjacent loculated pleural effusion. Suspected vertebral hemangiomatosis. CT ABDOMEN PELVIS FINDINGS Hepatobiliary: Faint  heterogeneity in  the liver with slightly accentuated subcapsular enhancement favoring passive congestion. Suspected early cirrhosis. Gallbladder unremarkable. Pancreas: Unremarkable Spleen: Unremarkable Adrenals/Urinary Tract: Stable left kidney lower pole benign-appearing cyst. Adrenal glands unremarkable. No renal calculi or abnormal renal enhancement identified. Stomach/Bowel: Unremarkable Vascular/Lymphatic: Recanalized umbilical vein and small gastric varices indicating portal venous hypertension. Aortoiliac atherosclerotic vascular disease. Reproductive: Retroverted uterus. 2.6 cm calcified fibroid centrally in the uterus. Adnexa unremarkable. Other: No supplemental non-categorized findings. Musculoskeletal: Chronic bilateral pars defects at L5 with 8 mm of anterolisthesis and borderline resulting bilateral foraminal impingement. Suspected multilevel hemangiomas in the lumbar spine. IMPRESSION: 1. Further increase in size of indistinctly marginated bilateral pulmonary nodules, suspicious for malignancy. The left lower lobe lesion along the posterior margin of the left lower lobe consolidation likewise appears mildly larger. 2. There is new mosaic attenuation in the lungs favored to be from low-grade edema, and most notable in the upper lobes. 3. Mild increase in size of the mildly loculated left pleural effusion which is probably exudative given the visceral pleural enhancement. Hepatic cirrhosis with portal venous hypertension, with recanalized umbilical vein and uphill varices adjacent to the distal esophagus. 4. Stable expansile and primarily sclerotic lesion of the left fourth rib laterally. There are scattered hypodense lesions in the thoracolumbar spine which are most characteristic for hemangiomas and which likewise appear stable. 5. Other imaging findings of potential clinical significance: Aortic Atherosclerosis (ICD10-I70.0). Moderate four-chamber cardiomegaly. Uterine fibroid. Chronic pars defects  at L5 with 8 mm anterolisthesis and borderline bilateral foraminal stenosis. Electronically Signed   By: Van Clines M.D.   On: 02/27/2018 16:59    ASSESSMENT AND PLAN:  This is a very pleasant 72 years old Hispanic female with a stage IV non-small cell lung cancer, adenocarcinoma with positive EGFR mutation with deletion in exon 19 diagnosed in July 2013 status post 3 years treatment with Tarceva discontinued secondary to disease progression and development of T7 58 M EGFR resistant mutation. The patient was started on treatment with Tagrisso 80 mg by mouth daily status post 25 months.  The patient is tolerating this treatment well with no concerning complaints. She had repeat CT scan of the chest, abdomen and pelvis.  I personally and independently reviewed the scan images and discussed the results and showed the images to the patient and her niece. Unfortunately her scan showed increase in size of bilateral pulmonary nodules but is specifically 2 lesions in the right lung. I had a lengthy discussion with the patient and her niece about her current condition and treatment options. Unfortunately it looks like the patient will start having resistant to her treatment with Tagrisso. I recommended for her to see Dr. Lisbeth Renshaw for consideration of palliative radiotherapy to the enlarging right lung nodules. She will continue on Tagrisso for now and if she has any further progression of her disease, I may consider repeating molecular studies to see if the patient develop any new resistant actionable mutations, otherwise she will be treated with systemic chemotherapy. She will come back for follow-up visit in 1 month for reevaluation and repeat blood work. For hypertension, she was advised to take her blood pressure medication as prescribed and to monitor it closely at home. The patient was advised to call immediately if she has any concerning symptoms in the interval. All questions were answered. The  patient knows to call the clinic with any problems, questions or concerns. We can certainly see the patient much sooner if necessary.  Disclaimer: This note was dictated with voice recognition software.  Similar sounding words can inadvertently be transcribed and may not be corrected upon review.

## 2018-03-05 NOTE — Telephone Encounter (Signed)
Printed avs and calender of upcoming appointment. Per 4/9 los 

## 2018-03-06 ENCOUNTER — Encounter: Payer: Self-pay | Admitting: Radiation Oncology

## 2018-03-07 ENCOUNTER — Ambulatory Visit (INDEPENDENT_AMBULATORY_CARE_PROVIDER_SITE_OTHER): Payer: Medicare Other | Admitting: Family Medicine

## 2018-03-07 ENCOUNTER — Other Ambulatory Visit: Payer: Self-pay

## 2018-03-07 ENCOUNTER — Encounter: Payer: Self-pay | Admitting: Radiation Oncology

## 2018-03-07 ENCOUNTER — Encounter: Payer: Self-pay | Admitting: Family Medicine

## 2018-03-07 ENCOUNTER — Ambulatory Visit
Admission: RE | Admit: 2018-03-07 | Discharge: 2018-03-07 | Disposition: A | Discharge status: Home / Self Care | Payer: Medicare Other | Source: Ambulatory Visit | Attending: Radiation Oncology | Admitting: Radiation Oncology

## 2018-03-07 VITALS — BP 138/68 | HR 86 | Temp 98.0°F | Resp 18 | Ht 61.0 in | Wt 153.8 lb

## 2018-03-07 VITALS — BP 160/50 | HR 67 | Temp 97.7°F | Resp 20 | Ht 61.0 in | Wt 154.4 lb

## 2018-03-07 DIAGNOSIS — I10 Essential (primary) hypertension: Secondary | ICD-10-CM

## 2018-03-07 DIAGNOSIS — E559 Vitamin D deficiency, unspecified: Secondary | ICD-10-CM | POA: Diagnosis not present

## 2018-03-07 DIAGNOSIS — C799 Secondary malignant neoplasm of unspecified site: Secondary | ICD-10-CM

## 2018-03-07 DIAGNOSIS — Z801 Family history of malignant neoplasm of trachea, bronchus and lung: Secondary | ICD-10-CM | POA: Diagnosis not present

## 2018-03-07 DIAGNOSIS — E785 Hyperlipidemia, unspecified: Secondary | ICD-10-CM | POA: Diagnosis not present

## 2018-03-07 DIAGNOSIS — Z9221 Personal history of antineoplastic chemotherapy: Secondary | ICD-10-CM | POA: Insufficient documentation

## 2018-03-07 DIAGNOSIS — C3492 Malignant neoplasm of unspecified part of left bronchus or lung: Secondary | ICD-10-CM

## 2018-03-07 DIAGNOSIS — Z79899 Other long term (current) drug therapy: Secondary | ICD-10-CM | POA: Insufficient documentation

## 2018-03-07 DIAGNOSIS — C7800 Secondary malignant neoplasm of unspecified lung: Secondary | ICD-10-CM | POA: Insufficient documentation

## 2018-03-07 DIAGNOSIS — C7801 Secondary malignant neoplasm of right lung: Secondary | ICD-10-CM | POA: Diagnosis not present

## 2018-03-07 DIAGNOSIS — Z880 Allergy status to penicillin: Secondary | ICD-10-CM | POA: Insufficient documentation

## 2018-03-07 DIAGNOSIS — R05 Cough: Secondary | ICD-10-CM | POA: Diagnosis not present

## 2018-03-07 DIAGNOSIS — R059 Cough, unspecified: Secondary | ICD-10-CM

## 2018-03-07 DIAGNOSIS — C3432 Malignant neoplasm of lower lobe, left bronchus or lung: Secondary | ICD-10-CM | POA: Diagnosis not present

## 2018-03-07 DIAGNOSIS — Z9289 Personal history of other medical treatment: Secondary | ICD-10-CM | POA: Diagnosis not present

## 2018-03-07 MED ORDER — BENZONATATE 100 MG PO CAPS: 100.0000 mg | ORAL_CAPSULE | Freq: Three times a day (TID) | ORAL | 1 refills | Status: DC | PRN

## 2018-03-07 NOTE — Progress Notes (Signed)
Thoracic Location of Tumor / Histology:Metastatic non-small cell lung cancer, adenocarcinoma initially diagnosed in July 2013,enlarging right lung nodules    Patient presented months ago with symptoms of: 02-27-18 Had a recent CT scan of chest w contrast and CT of  abdomen and pelvis her scans showed increase in size of bilateral pulmonary nodules but is specifically 2 lesions in the right lung.   Biopsies of  (if applicable) revealed:No Ct biopsy since 06-19-2012 see results in chart under pathology section of chart  Tobacco/Marijuana/Snuff/ETOH use:non smoker, no smokeless tobacco, non alcohol, no illicit drugs    Past/Anticipated interventions by cardiothoracic surgery, if any:   Past/Anticipated interventions by medical oncology, if any:       IMPRESSION:02-27-18 CT Chest w Contrast 1. Further increase in size of indistinctly marginated bilateral pulmonary nodules, suspicious for malignancy. The left lower lobe lesion along the posterior margin of the left lower lobe consolidation likewise appears mildly larger. 2. There is new mosaic attenuation in the lungs favored to be from low-grade edema, and most notable in the upper lobes. 3. Mild increase in size of the mildly loculated left pleural effusion which is probably exudative given the visceral pleural enhancement. Hepatic cirrhosis with portal venous hypertension, with recanalized umbilical vein and uphill varices adjacent to the distal esophagus. 4. Stable expansile and primarily sclerotic lesion of the left fourth rib laterally. There are scattered hypodense lesions in the thoracolumbar spine which are most characteristic for hemangiomas and which likewise appear stable. 5. Other imaging findings of potential clinical significance: Aortic Atherosclerosis (ICD10-I70.0). Moderate four-chamber cardiomegaly. Uterine fibroid. Chronic pars defects at L5 with 8 mm anterolisthesis and borderline bilateral foraminal  stenosis.   IMPRESSION:02-27-18 CT Abdomen Pelvis w Contrast 1. Further increase in size of indistinctly marginated bilateral pulmonary nodules, suspicious for malignancy. The left lower lobe lesion along the posterior margin of the left lower lobe consolidation likewise appears mildly larger. 2. There is new mosaic attenuation in the lungs favored to be from low-grade edema, and most notable in the upper lobes. 3. Mild increase in size of the mildly loculated left pleural effusion which is probably exudative given the visceral pleural enhancement. Hepatic cirrhosis with portal venous hypertension, with recanalized umbilical vein and uphill varices adjacent to the distal esophagus. 4. Stable expansile and primarily sclerotic lesion of the left fourth rib laterally. There are scattered hypodense lesions in the thoracolumbar spine which are most characteristic for hemangiomas and which likewise appear stable. 5. Other imaging findings of potential clinical significance: Aortic Atherosclerosis (ICD10-I70.0). Moderate four-chamber cardiomegaly. Uterine fibroid. Chronic pars defects at L5 with 8 mm anterolisthesis and borderline bilateral foraminal stenosis.     Current Therapy 1) Tagrisso 80 mg by mouth daily started 02/03/2016 status post more than 25 months of treatment. 2) Xgeva 120 mg subcutaneously every 4 weeks for bone metastasis.   Prior Therapy  Tarceva 150 mg by mouth daily, therapy beginning 07/20/2012. Status post approximately 36 months of therapy. This was discontinued secondary to disease progression    Signs/Symptoms Weight changes, if any:  Wt Readings from Last 3 Encounters:  03/07/18 154 lb 6.4 oz (70 kg)  03/05/18 155 lb 12.8 oz (70.7 kg)  02/08/18 156 lb (70.8 kg)     Respiratory complaints, if any: Denies SOB and wheezing has a non productive cough  Hemoptysis, if any:No   Pain issues, if any:  No  SAFETY ISSUES: Prior radiation?  :07/11/12-07/24/12 left face rib  07/22/14-07/31/14 left lower lobe lung      Pacemaker/ICD? :No  Possible current pregnancy?; No  Is the patient on methotrexate? : No  Current Complaints / other details:   BP (!) 160/50 (BP Location: Right Arm, Patient Position: Sitting, Cuff Size: Normal)   Pulse 67   Temp 97.7 F (36.5 C) (Oral)   Resp 20   Ht 5\' 1"  (1.549 m)   Wt 154 lb 6.4 oz (70 kg)   SpO2 100%   BMI 29.17 kg/m

## 2018-03-07 NOTE — Patient Instructions (Addendum)
IF you received an x-ray today, you will receive an invoice from [facility]. Please contact [facility] at [phone] with questions or concerns regarding your invoice.   IF you received labwork today, you will receive an invoice from [place]. Please contact [facility] at [phone] with questions or concerns regarding your invoice.   Our billing staff will not be able to assist you with questions regarding bills from these companies.  You will be contacted with the lab results as soon as they are available. The fastest way to get your results is to activate your My Chart account. Instructions are located on the last page of this paperwork. If you have not heard from [place] regarding the results in 2 weeks, please contact this office.       Dieta restringida en grasas y colesterol (Fat and Cholesterol Restricted Diet) El exceso de grasas y colesterol en la dieta puede causar problemas de salud. Esta dieta lo ayudar a Theatre manager las grasas y Freight forwarder en los niveles normales para evitar enfermarse. QU TIPOS DE GRASAS DEBO ELEGIR?  Elija grasas monosaturadas y polinsaturadas. Estas se encuentran en alimentos como el aceite de oliva, aceite de canola, semillas de lino, nueces, almendras y semillas.  Consuma ms grasas omega-3. Las mejores opciones incluyen salmn, caballa, sardinas, atn, aceite de lino y semillas de lino molidas.  Limite el consumo de grasas saturadas, que se encuentran en productos de origen animal, como carnes, mantequilla y crema. Tambin pueden estar en productos vegetales, como aceite de palma, de palmiste y de coco.  Evite los alimentos con aceites parcialmente hidrogenados. Estos contienen grasas trans. Entre los ejemplos de alimentos con grasas trans se incluyen margarinas en barra, algunas margarinas untables, galletas dulces o saladas y otros productos horneados. [place]?  Lea las etiquetas de los alimentos.  Busque las palabras "grasas trans" y "grasas saturadas".  Al preparar una comida: ? Llene la mitad del plato con verduras y ensaladas de hojas verdes. ? Llene un cuarto del plato con cereales integrales. Busque la palabra "integral" en Equities trader de la lista de ingredientes. ? Llene un cuarto del plato con alimentos con protenas magras.  Ingiera alimentos con ms fibra, como [place], [place], frijoles, guisantes y [place].  Coma ms comidas caseras. Coma menos en los restaurantes y los bares.  Limite o evite el alcohol.  Limite los alimentos con alto contenido de almidn y Location manager.  Limite el consumo de alimentos fritos.  Cocine los alimentos sin frerlos. Las opciones de coccin ms [place] son Teacher, music, Adult nurse, Interior and spatial designer y asar a Administrator, arts.  Baje de peso si es necesario. Aunque pierda Automatic Data, esto puede ser importante para la salud general. Tambin puede ayudar a prevenir enfermedades como diabetes y enfermedad cardaca. QU ALIMENTOS PUEDO COMER? Cereales Cereales integrales, como los panes de salvado o [place], las [place], los cereales y las pastas. Avena sin endulzar, trigo, [place], quinua o arroz integral. Tortillas de harina de maz o de salvado. Verduras Verduras frescas o congeladas (crudas, al vapor, asadas o grilladas). Ensaladas de hojas verdes. Lambert Mody Lambert Mody frescas, en conserva (en su jugo natural) o frutas congeladas. Carnes y otros productos con protenas Carne de res molida (al 85% o ms [place]), carne de res de animales alimentados con pastos o carne de res sin la grasa. Pollo o pavo sin piel. Carne de pollo o de [place]. Cerdo sin la grasa. Todos los pescados y frutos de mar. Huevos. Porotos, guisantes o lentejas  secos. Frutos secos o semillas sin sal. Frijoles secos o en lata sin sal. Lcteos Productos lcteos con bajo contenido de grasas, como [employer] o al 1%, quesos reducidos en grasas o al 2%, ricota con bajo contenido de grasas o  [employer], o yogur natural con bajo contenido de [place]. Grasas y [employer] untables que no contengan grasas trans. [place] y condimentos para ensaladas livianos o reducidos en grasas. [place]. Aceites de oliva, canola, ssamo o crtamo. Mantequilla natural de cacahuate o almendra (elija la que no tenga agregado de aceite o azcar). Los artculos mencionados arriba pueden no ser [employer] de las bebidas o los alimentos recomendados. Comunquese con el nutricionista para conocer ms opciones. QU ALIMENTOS NO SE RECOMIENDAN? Cereales Pan blanco. Pastas blancas. Arroz blanco. Pan de maz. Bagels, pasteles y croissants. Galletas saladas que contengan grasas trans. Verduras Papas blancas. [place] con crema o fritas. Verduras en [place]. Lambert Mody Frutas secas. Fruta enlatada en almbar liviano o espeso. Jugo de frutas. Carnes y otros productos con protenas [place] de carne con Lobbyist. Costillas, alas de pollo, tocineta, salchicha, mortadela, salame, chinchulines, tocino, perros calientes, salchichas alemanas y embutidos envasados. Hgado y otros rganos. Lcteos [place] entera o al 2%, crema, mezcla de [place] y crema, y queso crema. Quesos enteros. Yogur entero o endulzado. Quesos con toda su grasa. Cremas no lcteas y coberturas batidas. Quesos procesados, quesos para untar o cuajadas. Dulces y postres Jarabe de maz, azcares, miel y Control and instrumentation engineer. Caramelos. Mermelada y [place]. Chrissie Noa. Cereales endulzados. Galletas, pasteles, bizcochuelos, donas, muffins y helado. Grasas y aceites [place], [place] en barra, [place] de [place], [place], [place] clarificada o grasa de tocino. Aceites de coco, de palmiste o de palma. Bebidas Alcohol. Bebidas endulzadas (como refrescos, limonadas y bebidas frutales o ponches). Los artculos mencionados arriba pueden no ser [employer] de las bebidas y los alimentos que se Higher education careers adviser. Comunquese con el nutricionista para  obtener ms informacin. Esta informacin no tiene Marine scientist el consejo del mdico. Asegrese de hacerle al mdico cualquier pregunta que tenga. Document Released: 05/14/2012 Document Revised: 12/04/2014 Document Reviewed: 02/12/2014 Elsevier Interactive Patient Education  [employer].

## 2018-03-07 NOTE — Progress Notes (Signed)
Radiation Oncology         [phone] ________________________________  Name: Jacqueline Leonard        MRN: [id number]  Date of Service: 03/07/2018 DOB: 1946-02-19  CC:Jacqueline Bears, MD  Jacqueline Bears, MD     REFERRING PHYSICIAN: Curt Bears, MD   DIAGNOSIS: The primary encounter diagnosis was Adenocarcinoma of left lung, stage 4 ([place]). A diagnosis of Metastatic adenocarcinoma, pathology 06/19/12 was also pertinent to this visit.   HISTORY OF PRESENT ILLNESS: Jacqueline Leonard is a 72 y.o. female seen at the request of Dr. Julien Leonard for a history of Stage IV, EGFR positive, NSCLC, adenocarcinoma of the left lung. The patient is well known to Dr. Lisbeth Leonard and was treated in 2013 to the left rib with palliative radiotherapy and in 2015 received SBRT to the left lower lobe. She has continued on systemic therapy with Tagrisso under the care of Dr. Julien Leonard, and a recent CT scan on 02/27/18 compared to her prior on 11/30/17 revealed and increase in the size of a RUL lesion and a RLL lesion, the RUL measured 2.1 x 2.5 cm, and prior it had been 1.3 x 1.9 cm. The RLL lesion is 1.2 x .8 cm, prior it had been 6 x 7 mm. The left lower lobe appears stable with post radiotherapy change, and some attenuation consistent with pulmonary edema, and persistent nodularity of the LLL, 2.5 x 1.5 cm, stable in the interval when it had been 2.3 x 1.3 cm. She comes today to discuss options of SBRT to the RUL and RLL lesions. Of note the plan is to continue with Tagrisso therapy.     PREVIOUS RADIATION THERAPY: Yes   07/22/14-07/31/14 SBRT Treatment: To a 14 mm left lower lobe lung lesion. 60 Gy was given in 5 fractions  07/11/12-07/24/12: 30 Gy in 10 fractions to the Left lateral 5th rib   PAST MEDICAL HISTORY:  Past Medical History:  Diagnosis Date  . Allergy   . Anxiety   . Cancer, metastatic to bone ([place]) 06/19/12   bx=L 5th ribmetastatic Adenocarcinoma with known lung mass  . Encounter for antineoplastic  chemotherapy 02/14/2016  . External hemorrhoids   . Hyperlipidemia   . Hypertension   . Lung mass   . Radiation 07/11/12-07/24/12   Palliative lung tx 30 gray in 10 fx  . S/P radiation therapy 07/22/14-07/31/14   left lung/60gy/12f  . Status post chemoradiation    T[place]ce[place]  . Vitamin D deficiency        PAST SURGICAL HISTORY: Past Surgical History:  Procedure Laterality Date  . APPENDECTOMY    . OVARIAN CYST REMOVAL    . SOFT TISSUE BIOPSY  06/19/12   L 5th rib=metastatic adenocarcinoma     FAMILY HISTORY:  Family History  Problem Relation Age of Onset  . Breast cancer Sister 548 . Breast cancer Sister 55 . Lung cancer Brother      SOCIAL HISTORY:  reports that she has never smoked. She has never used smokeless tobacco. She reports that she does not drink alcohol or use drugs. The patient is divorced and lives in [place] She has a niece who accompanies her. [place]   ALLERGIES: Penicillins   MEDICATIONS:  Current Outpatient Medications  Medication Sig Dispense Refill  . amLODipine (NORVASC) 2.5 MG tablet Take 1 tablet (2.5 mg total) by mouth daily. 30 tablet 3  . cholecalciferol (VITAMIN D) 1000 UNITS tablet Take  1,000 Units by mouth daily.    . CYANOCOBALAMIN PO Take 1,000 mcg by mouth daily.    Marland Kitchen FeFum-FePoly-FA-B Cmp-C-Biot (INTEGRA PLUS) CAPS Take 1 capsule by mouth every morning. 30 capsule 2  . osimertinib mesylate (TAGRISSO) 80 MG tablet Take 1 tablet (80 mg total) by mouth daily. 30 tablet 2  . potassium chloride (K-DUR) 10 MEQ tablet Take 1 tablet by mouth daily.     No current facility-administered medications for this encounter.      REVIEW OF SYSTEMS: On review of systems, with the assistance of a spanish speaking interpreter, the patient reports that she is doing well overall. She denies any chest pain, shortness of breath, fevers, chills, night sweats, unintended weight changes. She has  noticed a dry cough for the last two weeks, and denies hemoptysis. She denies any bowel or bladder disturbances, and denies abdominal pain, nausea or vomiting. She denies any new musculoskeletal or joint aches or pains. A complete review of systems is obtained and is otherwise negative.     PHYSICAL EXAM:  Wt Readings from Last 3 Encounters:  03/07/18 154 lb 6.4 oz (70 kg)  03/05/18 155 lb 12.8 oz (70.7 kg)  02/08/18 156 lb (70.8 kg)   Temp Readings from Last 3 Encounters:  03/07/18 97.7 F (36.5 C) (Oral)  03/05/18 97.8 F (36.6 C) (Oral)  02/06/18 98.5 F (36.9 C) (Oral)   BP Readings from Last 3 Encounters:  03/07/18 (!) 160/50  03/05/18 (!) 159/50  02/08/18 118/76   Pulse Readings from Last 3 Encounters:  03/07/18 67  03/05/18 74  02/06/18 80   Pain Assessment Pain Score: 0-No pain/10  In general this is a well appearing Hispanic female in no acute distress. She is alert and oriented x4 and appropriate throughout the examination. HEENT reveals that the patient is normocephalic, atraumatic. EOMs are intact. PERRLA. Skin is intact without any evidence of gross lesions. Cardiovascular exam reveals a regular rate and rhythm, no clicks rubs or murmurs are auscultated. Chest is clear to auscultation bilaterally. Lymphatic assessment is performed and does not reveal any adenopathy in the cervical, supraclavicular, axillary, or inguinal chains. Abdomen has active bowel sounds in all quadrants and is intact. The abdomen is soft, non tender, non distended. Lower extremities are negative for pretibial pitting edema, deep calf tenderness, cyanosis or clubbing.   ECOG = 1  0 - Asymptomatic (Fully active, able to carry on all predisease activities without restriction)  1 - Symptomatic but completely ambulatory (Restricted in physically strenuous activity but ambulatory and able to carry out work of a light or sedentary nature. For example, light housework, office work)  2 - Symptomatic,  <50% in bed during the day (Ambulatory and capable of all self care but unable to carry out any work activities. Up and about more than 50% of waking hours)  3 - Symptomatic, >50% in bed, but not bedbound (Capable of only limited self-care, confined to bed or chair 50% or more of waking hours)  4 - Bedbound (Completely disabled. Cannot carry on any self-care. Totally confined to bed or chair)  5 - Death   Eustace Pen MM, Creech RH, Tormey DC, et al. [phone]). "Toxicity and response criteria of the [facility]". [place] Oncol. 5 (6): 649-55    LABORATORY DATA:  Lab Results  Component Value Date   WBC 4.1 02/27/2018   HGB 10.5 (L) 12/03/2017   HCT 34.9 02/27/2018   MCV 87.8 02/27/2018   PLT 96 (  L) 02/27/2018   Lab Results  Component Value Date   NA 142 02/27/2018   K 3.9 02/27/2018   CL 107 02/27/2018   CO2 28 02/27/2018   Lab Results  Component Value Date   ALT 17 02/27/2018   AST 22 02/27/2018   ALKPHOS 75 02/27/2018   BILITOT 0.5 02/27/2018      RADIOGRAPHY: Ct Chest W Contrast  Result Date: 02/27/2018 CLINICAL DATA:  Metastatic lung cancer, restaging assessment. Cough for 2 weeks. EXAM: CT CHEST, ABDOMEN, AND PELVIS WITH CONTRAST TECHNIQUE: Multidetector CT imaging of the chest, abdomen and pelvis was performed following the standard protocol during bolus administration of intravenous contrast. CONTRAST:  148m ISOVUE-300 IOPAMIDOL (ISOVUE-300) INJECTION 61% COMPARISON:  Multiple exams, including 11/30/2017 FINDINGS: CT CHEST FINDINGS Cardiovascular: Aortic arch and branch vessel atherosclerotic vascular disease with tortuosity of the branch vessels. Moderate four-chamber cardiomegaly. Mediastinum/Nodes: No pathologic thoracic adenopathy identified. Uphill varices adjacent to the distal esophagus. Lungs/Pleura: Further progression in bilateral ill-defined nodular opacities in both lungs which have enlarged. For example the previous 1.3 by 1.9 cm opacity in  the right upper lobe currently measures 2.1 by 2.5 cm. The previous 0.6 by 0.7 cm right lower lobe pulmonary nodule currently measures 1.2 by 0.8 cm on image 86/5. There has been commensurate enlargement in most of the scattered pulmonary nodules in both lungs, some of which are ill-defined. In addition, there is new bilateral mosaic attenuation in the lungs with some worsening more dependently, suggesting pulmonary edema as the cause. Continued consolidation in the infrahilar portion of the left lower lobe. Nodularity along the posterior margin of the consolidated and atelectatic lung measures 2.5 by 1.5 cm on image 23/3, formerly 2.3 by 1.3 cm. There is some enhancement along the visceral pleural margin of the moderate-sized left pleural effusion which is mildly increased in size compared to the prior exam. There are mildly loculated components of the pleural effusion laterally. Musculoskeletal: Expansile and primarily sclerotic lesion of the left fourth rib laterally similar to prior, with adjacent loculated pleural effusion. Suspected vertebral hemangiomatosis. CT ABDOMEN PELVIS FINDINGS Hepatobiliary: Faint heterogeneity in the liver with slightly accentuated subcapsular enhancement favoring passive congestion. Suspected early cirrhosis. Gallbladder unremarkable. Pancreas: Unremarkable Spleen: Unremarkable Adrenals/Urinary Tract: Stable left kidney lower pole benign-appearing cyst. Adrenal glands unremarkable. No renal calculi or abnormal renal enhancement identified. Stomach/Bowel: Unremarkable Vascular/Lymphatic: Recanalized umbilical vein and small gastric varices indicating portal venous hypertension. Aortoiliac atherosclerotic vascular disease. Reproductive: Retroverted uterus. 2.6 cm calcified fibroid centrally in the uterus. Adnexa unremarkable. Other: No supplemental non-categorized findings. Musculoskeletal: Chronic bilateral pars defects at L5 with 8 mm of anterolisthesis and borderline resulting  bilateral foraminal impingement. Suspected multilevel hemangiomas in the lumbar spine. IMPRESSION: 1. Further increase in size of indistinctly marginated bilateral pulmonary nodules, suspicious for malignancy. The left lower lobe lesion along the posterior margin of the left lower lobe consolidation likewise appears mildly larger. 2. There is new mosaic attenuation in the lungs favored to be from low-grade edema, and most notable in the upper lobes. 3. Mild increase in size of the mildly loculated left pleural effusion which is probably exudative given the visceral pleural enhancement. Hepatic cirrhosis with portal venous hypertension, with recanalized umbilical vein and uphill varices adjacent to the distal esophagus. 4. Stable expansile and primarily sclerotic lesion of the left fourth rib laterally. There are scattered hypodense lesions in the thoracolumbar spine which are most characteristic for hemangiomas and which likewise appear stable. 5. Other imaging findings of potential clinical significance: Aortic  Atherosclerosis (ICD10-I70.0). Moderate four-chamber cardiomegaly. Uterine fibroid. Chronic pars defects at L5 with 8 mm anterolisthesis and borderline bilateral foraminal stenosis. Electronically Signed   By: Van Clines M.D.   On: 02/27/2018 16:59   Ct Abdomen Pelvis W Contrast  Result Date: 02/27/2018 CLINICAL DATA:  Metastatic lung cancer, restaging assessment. Cough for 2 weeks. EXAM: CT CHEST, ABDOMEN, AND PELVIS WITH CONTRAST TECHNIQUE: Multidetector CT imaging of the chest, abdomen and pelvis was performed following the standard protocol during bolus administration of intravenous contrast. CONTRAST:  151m ISOVUE-300 IOPAMIDOL (ISOVUE-300) INJECTION 61% COMPARISON:  Multiple exams, including 11/30/2017 FINDINGS: CT CHEST FINDINGS Cardiovascular: Aortic arch and branch vessel atherosclerotic vascular disease with tortuosity of the branch vessels. Moderate four-chamber cardiomegaly.  Mediastinum/Nodes: No pathologic thoracic adenopathy identified. Uphill varices adjacent to the distal esophagus. Lungs/Pleura: Further progression in bilateral ill-defined nodular opacities in both lungs which have enlarged. For example the previous 1.3 by 1.9 cm opacity in the right upper lobe currently measures 2.1 by 2.5 cm. The previous 0.6 by 0.7 cm right lower lobe pulmonary nodule currently measures 1.2 by 0.8 cm on image 86/5. There has been commensurate enlargement in most of the scattered pulmonary nodules in both lungs, some of which are ill-defined. In addition, there is new bilateral mosaic attenuation in the lungs with some worsening more dependently, suggesting pulmonary edema as the cause. Continued consolidation in the infrahilar portion of the left lower lobe. Nodularity along the posterior margin of the consolidated and atelectatic lung measures 2.5 by 1.5 cm on image 23/3, formerly 2.3 by 1.3 cm. There is some enhancement along the visceral pleural margin of the moderate-sized left pleural effusion which is mildly increased in size compared to the prior exam. There are mildly loculated components of the pleural effusion laterally. Musculoskeletal: Expansile and primarily sclerotic lesion of the left fourth rib laterally similar to prior, with adjacent loculated pleural effusion. Suspected vertebral hemangiomatosis. CT ABDOMEN PELVIS FINDINGS Hepatobiliary: Faint heterogeneity in the liver with slightly accentuated subcapsular enhancement favoring passive congestion. Suspected early cirrhosis. Gallbladder unremarkable. Pancreas: Unremarkable Spleen: Unremarkable Adrenals/Urinary Tract: Stable left kidney lower pole benign-appearing cyst. Adrenal glands unremarkable. No renal calculi or abnormal renal enhancement identified. Stomach/Bowel: Unremarkable Vascular/Lymphatic: Recanalized umbilical vein and small gastric varices indicating portal venous hypertension. Aortoiliac atherosclerotic vascular  disease. Reproductive: Retroverted uterus. 2.6 cm calcified fibroid centrally in the uterus. Adnexa unremarkable. Other: No supplemental non-categorized findings. Musculoskeletal: Chronic bilateral pars defects at L5 with 8 mm of anterolisthesis and borderline resulting bilateral foraminal impingement. Suspected multilevel hemangiomas in the lumbar spine. IMPRESSION: 1. Further increase in size of indistinctly marginated bilateral pulmonary nodules, suspicious for malignancy. The left lower lobe lesion along the posterior margin of the left lower lobe consolidation likewise appears mildly larger. 2. There is new mosaic attenuation in the lungs favored to be from low-grade edema, and most notable in the upper lobes. 3. Mild increase in size of the mildly loculated left pleural effusion which is probably exudative given the visceral pleural enhancement. Hepatic cirrhosis with portal venous hypertension, with recanalized umbilical vein and uphill varices adjacent to the distal esophagus. 4. Stable expansile and primarily sclerotic lesion of the left fourth rib laterally. There are scattered hypodense lesions in the thoracolumbar spine which are most characteristic for hemangiomas and which likewise appear stable. 5. Other imaging findings of potential clinical significance: Aortic Atherosclerosis (ICD10-I70.0). Moderate four-chamber cardiomegaly. Uterine fibroid. Chronic pars defects at L5 with 8 mm anterolisthesis and borderline bilateral foraminal stenosis. Electronically Signed   By:  Van Clines M.D.   On: 02/27/2018 16:59       IMPRESSION/PLAN: 1. History of Stage IV, EGFR mutated, NSCLC, adenocarcinoma of the LLL with increasing disease in the RUL and RLL. Dr. Lisbeth Leonard discusses the pathology findings and reviews the nature of the new imaging findings, and reviews the rationale to consider ablative therapy with stereotactic body radiotherapy (SBRT). He discusses treating both the RUL and the RLL. a We  discussed the risks, benefits, short, and long term effects of radiotherapy, and the patient is interested in proceeding. Dr. Lisbeth Leonard discusses the delivery and logistics of radiotherapy and anticipates a course of 3-5 fractions over 1-2 weeks. Written consent is obtained and placed in the chart, a copy was provided to the patient. She has company in town until after the Easter holiday and would like to plan to simulate following this. We will look at simulation the week of 03/18/18. She will also continue to follow up with Dr. Julien Leonard and continue her Tagrisso.   The above documentation reflects my direct findings during this shared patient visit. Please see the separate note by Dr. Lisbeth Leonard on this date for the remainder of the patient's plan of care.    Carola Rhine, PAC

## 2018-03-07 NOTE — Progress Notes (Signed)
4/11/20191:45 PM  Jacqueline Leonard 1945/12/09, 72 y.o. female [id number]  Chief Complaint  Patient presents with  . Hypertension    1 month follow-up   . Hyperlipidemia    HPI:   Patient is a 72 y.o. female with past medical history significant for metastatic lung cancer, HTN and HLP who presents today for follow-up on HTN and HLP  1. HTN - tolerating BP medication well. She has decided that her BP cuff at home is not accurate.  2. HLP - LDL 124, TC 198, tries to follow a healthy diet, low in fat and high in veggies 3. Metastatic lung caner - earlier today started 3-5 sessions of palliative radiation for slightly larger LN, onc has concerns for developing resistance to current medication 4. Cough - dry, non productive, intermittent, comes in episodes, no fever, chills, SOB, hemoptysis  Depression screen [facility] 2/9 03/07/2018 03/07/2018 02/06/2018  Decreased Interest 0 0 0  Down, Depressed, Hopeless 0 0 0  PHQ - 2 Score 0 0 0  Some recent data might be hidden    Allergies  Allergen Reactions  . Penicillins Palpitations and Other (See Comments)    Has patient had a PCN reaction causing immediate rash, facial/tongue/throat swelling, SOB or lightheadedness with hypotension: No Has patient had a PCN reaction causing severe rash involving mucus membranes or skin necrosis: No Has patient had a PCN reaction that required hospitalization No Has patient had a PCN reaction occurring within the last 10 years: Yes If all of the above answers are "NO", then may proceed with Cephalosporin use.    Prior to Admission medications   Medication Sig Start Date End Date Taking? Authorizing Provider  amLODipine (NORVASC) 2.5 MG tablet Take 1 tablet (2.5 mg total) by mouth daily. 02/06/18  Yes Rutherford Guys, MD  cholecalciferol (VITAMIN D) 1000 UNITS tablet Take 1,000 Units by mouth daily.   Yes [provider]  CYANOCOBALAMIN PO Take 1,000 mcg by mouth daily.   Yes [provider]    FeFum-FePoly-FA-B Cmp-C-Biot (INTEGRA PLUS) CAPS Take 1 capsule by mouth every morning. 03/12/17  Yes Curt Bears, MD  osimertinib mesylate (TAGRISSO) 80 MG tablet Take 1 tablet (80 mg total) by mouth daily. 01/22/18  Yes Curt Bears, MD  potassium chloride (K-DUR) 10 MEQ tablet Take 1 tablet by mouth daily. 03/13/14  Yes [provider]    Past Medical History:  Diagnosis Date  . Allergy   . Anxiety   . Cancer, metastatic to bone ([place]) 06/19/12   bx=L 5th ribmetastatic Adenocarcinoma with known lung mass  . Encounter for antineoplastic chemotherapy 02/14/2016  . External hemorrhoids   . Hyperlipidemia   . Hypertension   . Lung mass   . Radiation 07/11/12-07/24/12   Palliative lung tx 30 gray in 10 fx  . S/P radiation therapy 07/22/14-07/31/14   left lung/60gy/78fx  . Status post chemoradiation    Tarceva  . Vitamin D deficiency     Past Surgical History:  Procedure Laterality Date  . APPENDECTOMY    . OVARIAN CYST REMOVAL    . SOFT TISSUE BIOPSY  06/19/12   L 5th rib=metastatic adenocarcinoma    Social History   Tobacco Use  . Smoking status: Never Smoker  . Smokeless tobacco: Never Used  Substance Use Topics  . Alcohol use: No    Family History  Problem Relation Age of Onset  . Breast cancer Sister 72  . Breast cancer Sister 12  . Lung cancer Brother  ROS Per hpi  OBJECTIVE:  Blood pressure 138/68, pulse 86, temperature 98 F (36.7 C), temperature source Oral, resp. rate 18, height 5\' 1"  (1.549 m), weight 153 lb 12.8 oz (69.8 kg), SpO2 96 %.  Physical Exam  Constitutional: She is oriented to person, place, and time.  HENT:  Head: Normocephalic and atraumatic.  Mouth/Throat: Oropharynx is clear and moist. No oropharyngeal exudate.  Eyes: Pupils are equal, round, and reactive to light. EOM are normal. No scleral icterus.  Neck: Neck supple.  Cardiovascular: Normal rate, regular rhythm and normal heart sounds. Exam reveals no gallop and no  friction rub.  No murmur heard. Pulmonary/Chest: Effort normal and breath sounds normal. She has no wheezes. She has no rales.  Musculoskeletal: She exhibits no edema.  Neurological: She is alert and oriented to person, place, and time.  Skin: Skin is warm and dry.     ASSESSMENT and PLAN  1. Essential hypertension Controlled, cont current medication  2. Metastatic adenocarcinoma, pathology 06/19/12 Per med onc and rad onc  3. Hyperlipidemia, unspecified hyperlipidemia type At goal, continue with LFM  4. Cough - benzonatate (TESSALON) 100 MG capsule; Take 1 capsule (100 mg total) by mouth 3 (three) times daily as needed for cough.  Return in about 3 months (around 06/06/2018).    Rutherford Guys, MD Primary Care at [facility], [place] [address] Ph.  [phone] Fax [phone]

## 2018-03-12 ENCOUNTER — Ambulatory Visit
Admission: RE | Admit: 2018-03-12 | Discharge: 2018-03-12 | Disposition: A | Discharge status: Home / Self Care | Payer: Medicare Other | Source: Ambulatory Visit | Attending: Radiation Oncology | Admitting: Radiation Oncology

## 2018-03-12 DIAGNOSIS — C3432 Malignant neoplasm of lower lobe, left bronchus or lung: Secondary | ICD-10-CM | POA: Diagnosis not present

## 2018-03-12 DIAGNOSIS — C3492 Malignant neoplasm of unspecified part of left bronchus or lung: Secondary | ICD-10-CM | POA: Diagnosis not present

## 2018-03-12 DIAGNOSIS — Z510 Encounter for antineoplastic radiation therapy: Secondary | ICD-10-CM | POA: Diagnosis not present

## 2018-03-12 DIAGNOSIS — C7801 Secondary malignant neoplasm of right lung: Secondary | ICD-10-CM | POA: Diagnosis not present

## 2018-03-18 ENCOUNTER — Other Ambulatory Visit: Payer: Self-pay | Admitting: Gynecology

## 2018-03-18 ENCOUNTER — Encounter: Payer: Self-pay | Admitting: Gynecology

## 2018-03-18 ENCOUNTER — Ambulatory Visit (INDEPENDENT_AMBULATORY_CARE_PROVIDER_SITE_OTHER): Payer: Medicare Other | Admitting: Gynecology

## 2018-03-18 ENCOUNTER — Ambulatory Visit (INDEPENDENT_AMBULATORY_CARE_PROVIDER_SITE_OTHER): Payer: Medicare Other

## 2018-03-18 VITALS — BP 120/74

## 2018-03-18 DIAGNOSIS — N950 Postmenopausal bleeding: Secondary | ICD-10-CM

## 2018-03-18 DIAGNOSIS — R9389 Abnormal findings on diagnostic imaging of other specified body structures: Secondary | ICD-10-CM | POA: Diagnosis not present

## 2018-03-18 DIAGNOSIS — D259 Leiomyoma of uterus, unspecified: Secondary | ICD-10-CM

## 2018-03-18 DIAGNOSIS — N949 Unspecified condition associated with female genital organs and menstrual cycle: Secondary | ICD-10-CM

## 2018-03-18 DIAGNOSIS — N859 Noninflammatory disorder of uterus, unspecified: Secondary | ICD-10-CM

## 2018-03-18 DIAGNOSIS — N9489 Other specified conditions associated with female genital organs and menstrual cycle: Secondary | ICD-10-CM

## 2018-03-18 DIAGNOSIS — D251 Intramural leiomyoma of uterus: Secondary | ICD-10-CM | POA: Diagnosis not present

## 2018-03-18 DIAGNOSIS — N858 Other specified noninflammatory disorders of uterus: Secondary | ICD-10-CM | POA: Diagnosis not present

## 2018-03-18 NOTE — Patient Instructions (Signed)
Office will call with biopsy results

## 2018-03-18 NOTE — Progress Notes (Signed)
    Jacqueline Leonard Apr 09, 1946 [id number]        72 y.o.  G0P0000 presents with [facility] provided interpreter for sonohysterogram.  Being followed for stage IV lung cancer where recent CT scanning showed endometrial thickening at 1.1 cm.  Follow-up ultrasound showed endometrium measuring 1.5 cm with complex cystic area and fundal region of the uterus.  Patient follows up now for sonohysterogram.  Past medical history,surgical history, problem list, medications, allergies, family history and social history were all reviewed and documented in the EPIC chart.  Directed ROS with pertinent positives and negatives documented in the history of present illness/assessment and plan.  Exam: [place] provided interpreter assistant General appearance:  Normal Abdomen soft nontender without masses guarding rebound Pelvic external BUS vagina with atrophic changes.  Cervix with atrophic changes.  Uterus grossly normal midline mobile nontender.  Adnexa without masses or tenderness.    Ultrasound transvaginal and transabdominal shows uterus retroverted normal size.  Several small myomas noted 32 mm, 18 mm, 16 mm.  The larger myoma is calcified and also abutting into the endometrial cavity.  Cystic solid area above/adjacent to the calcified myoma measuring 19 x 17 mm.  Endometrial echo measuring 5.9 mm.  Right and left ovaries normal.  Cul-de-sac negative.  Sonohysterogram performed, sterile technique with single-tooth tenaculum anterior lip stabilization and disposable dilator to allow for catheter placement.  Distention of the endometrium below the calcified myoma noted with inability to pass the catheter above the myoma.  Endometrial sample was taken.  Patient tolerated well.  Assessment/Plan:  72 y.o. G0P0000 with incidental finding of thicker endometrium on CT scanning and ultrasound.  Sonohysterogram shows a calcified myoma somewhat obstructing the cavity.  A cystic area appears above this but  unable to thread the catheter into this area under direct visualization.  Attempts at dilatation also undertaken but still unable to pass the myoma.  Reviewed with the patient the situation.  The question is how aggressive to be able to completely sample the endometrium in an asymptomatic patient where the endometrium was noted to be thicker on a CT scan.  Proceeding now with hysteroscopy D&C versus waiting and re-ultrasounding in 3-6 months to relook at the upper endometrium discussed.  The issues of missed pathology reviewed.  Will await endometrial biopsy and then go from there.  She understands that we are only sampling the lower endometrium.    Anastasio Auerbach MD, 2:50 PM 03/18/2018

## 2018-03-19 DIAGNOSIS — C7801 Secondary malignant neoplasm of right lung: Secondary | ICD-10-CM | POA: Diagnosis not present

## 2018-03-19 DIAGNOSIS — C3432 Malignant neoplasm of lower lobe, left bronchus or lung: Secondary | ICD-10-CM | POA: Diagnosis not present

## 2018-03-19 DIAGNOSIS — Z510 Encounter for antineoplastic radiation therapy: Secondary | ICD-10-CM | POA: Diagnosis not present

## 2018-03-19 DIAGNOSIS — C3492 Malignant neoplasm of unspecified part of left bronchus or lung: Secondary | ICD-10-CM | POA: Diagnosis not present

## 2018-03-21 ENCOUNTER — Ambulatory Visit
Admission: RE | Admit: 2018-03-21 | Discharge: 2018-03-21 | Disposition: A | Discharge status: Home / Self Care | Payer: Medicare Other | Source: Ambulatory Visit | Attending: Radiation Oncology | Admitting: Radiation Oncology

## 2018-03-21 DIAGNOSIS — Z510 Encounter for antineoplastic radiation therapy: Secondary | ICD-10-CM | POA: Diagnosis not present

## 2018-03-21 DIAGNOSIS — C3492 Malignant neoplasm of unspecified part of left bronchus or lung: Secondary | ICD-10-CM | POA: Diagnosis not present

## 2018-03-21 DIAGNOSIS — C7801 Secondary malignant neoplasm of right lung: Secondary | ICD-10-CM | POA: Diagnosis not present

## 2018-03-25 ENCOUNTER — Other Ambulatory Visit: Payer: Self-pay | Admitting: Gynecology

## 2018-03-25 ENCOUNTER — Ambulatory Visit
Admission: RE | Admit: 2018-03-25 | Discharge: 2018-03-25 | Disposition: A | Discharge status: Home / Self Care | Payer: Medicare Other | Source: Ambulatory Visit | Attending: Radiation Oncology | Admitting: Radiation Oncology

## 2018-03-25 DIAGNOSIS — C7801 Secondary malignant neoplasm of right lung: Secondary | ICD-10-CM | POA: Diagnosis not present

## 2018-03-25 DIAGNOSIS — N949 Unspecified condition associated with female genital organs and menstrual cycle: Secondary | ICD-10-CM

## 2018-03-25 DIAGNOSIS — Z510 Encounter for antineoplastic radiation therapy: Secondary | ICD-10-CM | POA: Diagnosis not present

## 2018-03-25 DIAGNOSIS — C3492 Malignant neoplasm of unspecified part of left bronchus or lung: Secondary | ICD-10-CM | POA: Diagnosis not present

## 2018-03-25 DIAGNOSIS — N859 Noninflammatory disorder of uterus, unspecified: Secondary | ICD-10-CM

## 2018-03-26 ENCOUNTER — Ambulatory Visit: Payer: Medicare Other | Admitting: Radiation Oncology

## 2018-03-28 ENCOUNTER — Encounter: Payer: Self-pay | Admitting: Radiation Oncology

## 2018-03-28 ENCOUNTER — Ambulatory Visit
Admission: RE | Admit: 2018-03-28 | Discharge: 2018-03-28 | Disposition: A | Discharge status: Home / Self Care | Payer: Medicare Other | Source: Ambulatory Visit | Attending: Radiation Oncology | Admitting: Radiation Oncology

## 2018-03-28 DIAGNOSIS — C3432 Malignant neoplasm of lower lobe, left bronchus or lung: Secondary | ICD-10-CM | POA: Insufficient documentation

## 2018-03-28 DIAGNOSIS — Z510 Encounter for antineoplastic radiation therapy: Secondary | ICD-10-CM | POA: Diagnosis not present

## 2018-03-28 DIAGNOSIS — C7951 Secondary malignant neoplasm of bone: Secondary | ICD-10-CM | POA: Insufficient documentation

## 2018-03-28 DIAGNOSIS — C7801 Secondary malignant neoplasm of right lung: Secondary | ICD-10-CM | POA: Diagnosis not present

## 2018-03-29 NOTE — Progress Notes (Signed)
Jacqueline Leonard was seen today following completion of her SBRT. She reports feeling well without complaints of SOB, chest pain, fevers, chills, or concerns with skin disruptions. She does feel mildly tired. She was counseled for when to call us and will be seen in June for her 1 month appointment, a reminder card was given to the patient.    Carola Rhine, PAC

## 2018-04-08 ENCOUNTER — Inpatient Hospital Stay: Payer: Medicare Other | Attending: Internal Medicine

## 2018-04-08 ENCOUNTER — Inpatient Hospital Stay (HOSPITAL_BASED_OUTPATIENT_CLINIC_OR_DEPARTMENT_OTHER): Payer: Medicare Other | Admitting: Internal Medicine

## 2018-04-08 ENCOUNTER — Encounter: Payer: Self-pay | Admitting: Internal Medicine

## 2018-04-08 VITALS — BP 153/55 | HR 73 | Temp 98.1°F | Resp 16 | Ht 61.0 in | Wt 152.3 lb

## 2018-04-08 DIAGNOSIS — C3490 Malignant neoplasm of unspecified part of unspecified bronchus or lung: Secondary | ICD-10-CM

## 2018-04-08 DIAGNOSIS — C7951 Secondary malignant neoplasm of bone: Secondary | ICD-10-CM

## 2018-04-08 DIAGNOSIS — C7952 Secondary malignant neoplasm of bone marrow: Secondary | ICD-10-CM

## 2018-04-08 DIAGNOSIS — C3492 Malignant neoplasm of unspecified part of left bronchus or lung: Secondary | ICD-10-CM

## 2018-04-08 DIAGNOSIS — C799 Secondary malignant neoplasm of unspecified site: Secondary | ICD-10-CM

## 2018-04-08 DIAGNOSIS — C3432 Malignant neoplasm of lower lobe, left bronchus or lung: Secondary | ICD-10-CM | POA: Diagnosis not present

## 2018-04-08 LAB — CMP (CANCER CENTER ONLY)
ALBUMIN: 3.4 g/dL — AB (ref 3.5–5.0)
ALT: 14 U/L (ref 0–55)
ANION GAP: 5 (ref 3–11)
AST: 24 U/L (ref 5–34)
Alkaline Phosphatase: 70 U/L (ref 40–150)
BUN: 24 mg/dL (ref 7–26)
CO2: 29 mmol/L (ref 22–29)
Calcium: 9.3 mg/dL (ref 8.4–10.4)
Chloride: 106 mmol/L (ref 98–109)
Creatinine: 0.83 mg/dL (ref 0.60–1.10)
GFR, Est AFR Am: >60 mL/min (ref >60)
GFR, Estimated: >60 mL/min (ref >60)
GLUCOSE: 106 mg/dL (ref 70–140)
POTASSIUM: 4.7 mmol/L (ref 3.5–5.1)
SODIUM: 140 mmol/L (ref 136–145)
TOTAL PROTEIN: 7.6 g/dL (ref 6.4–8.3)
Total Bilirubin: 0.4 mg/dL (ref 0.2–1.2)

## 2018-04-08 LAB — CBC WITH DIFFERENTIAL (CANCER CENTER ONLY)
BASOS ABS: 0 10*3/uL (ref 0.0–0.1)
Basophils Relative: 1 %
EOS PCT: 2 %
Eosinophils Absolute: 0.1 10*3/uL (ref 0.0–0.5)
HEMATOCRIT: 33 % — AB (ref 34.8–46.6)
Hemoglobin: 10.9 g/dL — ABNORMAL LOW (ref 11.6–15.9)
LYMPHS ABS: 0.4 10*3/uL — AB (ref 0.9–3.3)
LYMPHS PCT: 12 %
MCH: 29.1 pg (ref 25.1–34.0)
MCHC: 33 g/dL (ref 31.5–36.0)
MCV: 88.1 fL (ref 79.5–101.0)
MONO ABS: 0.4 10*3/uL (ref 0.1–0.9)
Monocytes Relative: 10 %
NEUTROS ABS: 2.7 10*3/uL (ref 1.5–6.5)
Neutrophils Relative %: 75 %
PLATELETS: 92 10*3/uL — AB (ref 145–400)
RBC: 3.74 MIL/uL (ref 3.70–5.45)
RDW: 15.1 % — AB (ref 11.2–14.5)
WBC Count: 3.6 10*3/uL — ABNORMAL LOW (ref 3.9–10.3)

## 2018-04-08 NOTE — Progress Notes (Signed)
[facility] [phone]   [phone] [place], [place] [phone]  DIAGNOSIS: Metastatic non-small cell lung cancer, adenocarcinoma with positive EGFR mutation in exon 19 and development of resistant T790M mutation. This was initially diagnosed in July 2013.  PRIOR THERAPY: 1) Status post radiotherapy to the left face rib metastasis under the care of Dr. Lisbeth Renshaw.  2) stereotactic radiotherapy to the enlarging left lower lobe lung nodule under the care of Dr. Lisbeth Renshaw. 3) treatment with Tarceva 150 mg by mouth daily, therapy beginning 07/20/2012. Status post approximately 36 months of therapy. This was discontinued secondary to disease progression. 4) stereotactic radiotherapy to enlarging pulmonary nodules under the care of Dr. Lisbeth Renshaw.  CURRENT THERAPY: 1) Tagrisso 80 mg by mouth daily started 02/03/2016 status post more than 26 months of treatment. 2) Xgeva 120 mg subcutaneously every 4 weeks for bone metastasis.  INTERVAL HISTORY: Jacqueline Leonard 72 y.o. female returns to the clinic today for follow-up visit accompanied by her niece, [name].  The patient is feeling fine today with no specific complaints.  She tolerated the course of stereotactic radiotherapy to the enlarging pulmonary nodules fairly well.  She is complaining of increasing fatigue after the radiation therapy.  She denied having any chest pain, shortness of breath, cough or hemoptysis.  She denied having any fever or chills.  She has no nausea, vomiting, diarrhea or constipation.  She is here today for evaluation and repeat blood work.  MEDICAL HISTORY: Past Medical History:  Diagnosis Date  . Allergy   . Anxiety   . Cancer, metastatic to bone ([place]) 06/19/12   bx=L 5th ribmetastatic Adenocarcinoma with known lung mass  . Encounter for antineoplastic chemotherapy 02/14/2016  . External hemorrhoids   . Hyperlipidemia   .  Hypertension   . Lung mass   . Radiation 07/11/12-07/24/12   Palliative lung tx 30 gray in 10 fx  . S/P radiation therapy 07/22/14-07/31/14   left lung/60gy/17f  . Status post chemoradiation    Tarceva  . Vitamin D deficiency     ALLERGIES:  is allergic to penicillins.  MEDICATIONS:  Current Outpatient Medications  Medication Sig Dispense Refill  . amLODipine (NORVASC) 2.5 MG tablet Take 1 tablet (2.5 mg total) by mouth daily. 30 tablet 3  . benzonatate (TESSALON) 100 MG capsule Take 1 capsule (100 mg total) by mouth 3 (three) times daily as needed for cough. 20 capsule 1  . cholecalciferol (VITAMIN D) 1000 UNITS tablet Take 1,000 Units by mouth daily.    . CYANOCOBALAMIN PO Take 1,000 mcg by mouth daily.    .Marland KitchenFeFum-FePoly-FA-B Cmp-C-Biot (INTEGRA PLUS) CAPS Take 1 capsule by mouth every morning. 30 capsule 2  . osimertinib mesylate (TAGRISSO) 80 MG tablet Take 1 tablet (80 mg total) by mouth daily. 30 tablet 2  . potassium chloride (K-DUR) 10 MEQ tablet Take 1 tablet by mouth daily.     No current facility-administered medications for this visit.     SURGICAL HISTORY:  Past Surgical History:  Procedure Laterality Date  . APPENDECTOMY    . OVARIAN CYST REMOVAL    . SOFT TISSUE BIOPSY  06/19/12   L 5th rib=metastatic adenocarcinoma    REVIEW OF SYSTEMS:  A comprehensive review of systems was negative except for: Constitutional: positive for fatigue   PHYSICAL EXAMINATION: General appearance: alert, cooperative, fatigued and no distress Head: Normocephalic, without obvious abnormality, atraumatic Neck: no adenopathy, no JVD,  supple, symmetrical, trachea midline and thyroid not enlarged, symmetric, no tenderness/mass/nodules Lymph nodes: Cervical, supraclavicular, and axillary nodes normal. Resp: clear to auscultation bilaterally Back: symmetric, no curvature. ROM normal. No CVA tenderness. Cardio: regular rate and rhythm, S1, S2 normal, no murmur, click, rub or gallop GI: soft,  non-tender; bowel sounds normal; no masses,  no organomegaly Extremities: extremities normal, atraumatic, no cyanosis or edema  ECOG PERFORMANCE STATUS: 1 - Symptomatic but completely ambulatory  Blood pressure (!) 153/55, pulse 73, temperature 98.1 F (36.7 C), temperature source Oral, resp. rate 16, height _0  (1.549 m), weight 152 lb 4.8 oz (69.1 kg), SpO2 100 %.  LABORATORY DATA: Lab Results  Component Value Date   WBC 3.6 (L) 04/08/2018   HGB 10.9 (L) 04/08/2018   HCT 33.0 (L) 04/08/2018   MCV 88.1 04/08/2018   PLT 92 (L) 04/08/2018      Chemistry      Component Value Date/Time   NA 142 02/27/2018 1324   NA 140 11/01/2017 1423   K 3.9 02/27/2018 1324   K 3.7 11/01/2017 1423   CL 107 02/27/2018 1324   CL 102 05/06/2013 1511   CO2 28 02/27/2018 1324   CO2 25 11/01/2017 1423   BUN 25 02/27/2018 1324   BUN 21.2 11/01/2017 1423   CREATININE 0.83 02/27/2018 1324   CREATININE 1.0 11/01/2017 1423      Component Value Date/Time   CALCIUM 9.4 02/27/2018 1324   CALCIUM 9.3 11/01/2017 1423   ALKPHOS 75 02/27/2018 1324   ALKPHOS 81 11/01/2017 1423   AST 22 02/27/2018 1324   AST 30 11/01/2017 1423   ALT 17 02/27/2018 1324   ALT 21 11/01/2017 1423   BILITOT 0.5 02/27/2018 1324   BILITOT 0.45 11/01/2017 1423       RADIOGRAPHIC STUDIES: US Pelvis Complete  Result Date: 04/03/2018 Ultrasound transvaginal and transabdominal shows uterus retroverted normal size.  Several small myomas noted 32 mm, 18 mm, 16 mm.  The larger myoma is calcified and also abutting into the endometrial cavity.  Cystic solid area above/adjacent to the calcified myoma measuring 19 x 17 mm.  Endometrial echo measuring 5.9 mm.  Right and left ovaries normal.  Cul-de-sac negative.  Sonohysterogram performed, sterile technique with single-tooth tenaculum anterior lip stabilization and disposable dilator to allow for catheter placement.  Distention of the endometrium below the calcified myoma noted with  inability to pass the catheter above the myoma.  Endometrial sample was taken.  Patient tolerated well.   [place] Sonohysterogram  Result Date: 04/03/2018 Ultrasound transvaginal and transabdominal shows uterus retroverted normal size.  Several small myomas noted 32 mm, 18 mm, 16 mm.  The larger myoma is calcified and also abutting into the endometrial cavity.  Cystic solid area above/adjacent to the calcified myoma measuring 19 x 17 mm.  Endometrial echo measuring 5.9 mm.  Right and left ovaries normal.  Cul-de-sac negative.  Sonohysterogram performed, sterile technique with single-tooth tenaculum anterior lip stabilization and disposable dilator to allow for catheter placement.  Distention of the endometrium below the calcified myoma noted with inability to pass the catheter above the myoma.  Endometrial sample was taken.  Patient tolerated well.    ASSESSMENT AND PLAN:  This is a very pleasant 72 years old Hispanic female with a stage IV non-small cell lung cancer, adenocarcinoma with positive EGFR mutation with deletion in exon 19 diagnosed in July 2013 status post 3 years treatment with Tarceva discontinued secondary to disease progression and development of T7 90  M EGFR resistant mutation. The patient was started on treatment with Tagrisso 80 mg by mouth daily status post 26 months.  The patient also underwent palliative radiotherapy to the enlarging pulmonary nodules. She is tolerating her treatment with Tagrisso fairly well.  I recommended for her to continue the same treatment with the same dose. I will see her back for follow-up visit in 1 months for evaluation after repeating CT scan of the chest, abdomen and pelvis for restaging of her disease. She was advised to call immediately if she has any concerning symptoms in the interval. All questions were answered. The patient knows to call the clinic with any problems, questions or concerns. We can certainly see the patient much sooner if  necessary.  Disclaimer: This note was dictated with voice recognition software. Similar sounding words can inadvertently be transcribed and may not be corrected upon review.

## 2018-04-10 NOTE — Progress Notes (Signed)
  Radiation Oncology         [phone] ________________________________  Name: Jacqueline Leonard MRN: [id number]  Date: 03/12/2018  DOB: Apr 20, 1946  RESPIRATORY MOTION MANAGEMENT SIMULATION  NARRATIVE:  In order to account for effect of respiratory motion on target structures and other organs in the planning and delivery of radiotherapy, this patient underwent respiratory motion management simulation.  To accomplish this, when the patient was brought to the CT simulation planning suite, 4D respiratoy motion management CT images were obtained.  The CT images were loaded into the planning software.  Then, using a variety of tools including Cine, MIP, and standard views, the target volume and planning target volumes (PTV) were delineated.  Avoidance structures were contoured.  Treatment planning then occurred.  Dose volume histograms were generated and reviewed for each of the requested structure.  The resulting plan was carefully reviewed and approved today.   ------------------------------------------------  [name], MD, PhD

## 2018-04-10 NOTE — Progress Notes (Signed)
  Radiation Oncology         [phone] ________________________________  Name: Jacqueline Leonard MRN: [id number]  Date: 03/28/2018  DOB: 1946-07-18  End of Treatment Note  Diagnosis:   73 y.o. female with History of Stage IV, EGFR mutated, NSCLC, adenocarcinoma of the LLL with increasing disease in the RUL and RLL  Indication for treatment:  Curative       Radiation treatment dates:   03/21/2018, 03/25/2018, 03/28/2018  Site/dose:   The tumors in the RUL and RLL were treated with a course of stereotactic body radiation treatment. The patient received 54 Gy in 3 fractions at 18 Gy per fraction.  Narrative: The patient tolerated radiation treatment relatively well.   The patient did not have any signs of acute toxicity during treatment.  Plan: The patient has completed radiation treatment. The patient will return to radiation oncology clinic for routine followup in one month. I advised the patient to call or return sooner if they have any questions or concerns related to their recovery or treatment.   ------------------------------------------------  [name], MD, PhD  This document serves as a record of services personally performed by Kyung Rudd, MD. It was created on his behalf by Rae Lips, a trained medical scribe. The creation of this record is based on the scribe's personal observations and the provider's statements to them. This document has been checked and approved by the attending provider.

## 2018-04-10 NOTE — Progress Notes (Signed)
[facility] Radiation Oncology Simulation and Treatment Planning Note   Name:  Jacqueline Leonard MRN: [id number]   Date: 04/10/2018  DOB: [birth year]  Status:outpatient    DIAGNOSIS:    ICD-10-CM   1. Malignant neoplasm metastatic to right lung (HCC) C78.01   2. Adenocarcinoma of left lung, stage 4 (HCC) C34.92      CONSENT VERIFIED:yes   SET UP: Patient is setup supine   IMMOBILIZATION: The patient was immobilized using a customized Vac Loc bag/ blue bag and customized accuform device   NARRATIVE:The patient was brought to the [facility].  Identity was confirmed.  All relevant records and images related to the planned course of therapy were reviewed.  Then, the patient was positioned in a stable reproducible clinical set-up for radiation therapy. Abdominal compression was applied by me.  4D CT images were obtained and reproducible breathing pattern was confirmed. Free breathing CT images were obtained.  Skin markings were placed.  The CT images were loaded into the planning software where the target and avoidance structures were contoured.  The radiation prescription was entered and confirmed.    TREATMENT PLANNING NOTE:  Treatment planning then occurred. I have requested : IMRT planning.This treatment technique is medically necessary due to the high-dose of radiation delivered to the target region which is in close proximity to adjacent critical normal structures.  3 dimensional simulation is performed and dose volume histogram of the gross tumor volume, planning tumor volume and criticial normal structures including the spinal cord and lungs were analyzed and requested.  Special treatment procedure was performed due to high dose per fraction.  The patient will be monitored for increased risk of toxicity.  Daily imaging using cone beam CT will be used for target localization.  I anticipate that the patient will receive 54 Gy in 3 fractions to target  volumes in the right upper lobe and right lower lobe. Further adjustments will be made based on the planning process is necessary.  ------------------------------------------------  [name], MD, PhD

## 2018-04-19 ENCOUNTER — Telehealth: Payer: Self-pay | Admitting: Internal Medicine

## 2018-04-19 NOTE — Telephone Encounter (Signed)
Patient called to reschedule  °

## 2018-04-24 ENCOUNTER — Other Ambulatory Visit: Payer: Self-pay | Admitting: Family Medicine

## 2018-04-30 ENCOUNTER — Ambulatory Visit (INDEPENDENT_AMBULATORY_CARE_PROVIDER_SITE_OTHER): Payer: Medicare Other | Admitting: Urgent Care

## 2018-04-30 ENCOUNTER — Encounter: Payer: Self-pay | Admitting: Urgent Care

## 2018-04-30 VITALS — BP 130/50 | HR 83 | Temp 98.4°F | Resp 16 | Ht 61.0 in | Wt 150.6 lb

## 2018-04-30 DIAGNOSIS — R918 Other nonspecific abnormal finding of lung field: Secondary | ICD-10-CM

## 2018-04-30 DIAGNOSIS — C799 Secondary malignant neoplasm of unspecified site: Secondary | ICD-10-CM

## 2018-04-30 DIAGNOSIS — C3490 Malignant neoplasm of unspecified part of unspecified bronchus or lung: Secondary | ICD-10-CM

## 2018-04-30 DIAGNOSIS — R05 Cough: Secondary | ICD-10-CM

## 2018-04-30 DIAGNOSIS — R059 Cough, unspecified: Secondary | ICD-10-CM

## 2018-04-30 MED ORDER — BENZONATATE 100 MG PO CAPS: 100.0000 mg | ORAL_CAPSULE | Freq: Three times a day (TID) | ORAL | 0 refills | Status: DC | PRN

## 2018-04-30 MED ORDER — ACETAMINOPHEN-CODEINE 120-12 MG/5ML PO SOLN: 5.0000 mL | ORAL | 0 refills | Status: DC | PRN

## 2018-04-30 NOTE — Progress Notes (Signed)
   MRN: [id number] DOB: 27-Feb-1946  Subjective:   Jacqueline Leonard is a 72 y.o. female presenting for >3 month history of persistent mildly productive cough worse at night. Denies fever, chest pain, shob, wheezing. She has seen her PCP, Dr. Pamella Pert, for this and was using Tessalon. Patient is undergoing therapy for lung cancer, using Tagrisso, she is also on [place]. Denies smoking cigarettes, was never a smoker. Has CT scheduled in 2 weeks. Last chest CT was 02/27/2018.  Jacqueline Leonard has a current medication list which includes the following prescription(s): amlodipine, cholecalciferol, cyanocobalamin, integra plus, osimertinib mesylate, and potassium chloride. Also is allergic to penicillins.  Jacqueline Leonard  has a past medical history of Allergy, Anxiety, Cancer, metastatic to bone ([place]) (06/19/12), Encounter for antineoplastic chemotherapy (02/14/2016), External hemorrhoids, Hyperlipidemia, Hypertension, Lung mass, Radiation (07/11/12-07/24/12), S/P radiation therapy (07/22/14-07/31/14), Status post chemoradiation, and Vitamin D deficiency. Also  has a past surgical history that includes Soft Tissue Biopsy (06/19/12); Ovarian cyst removal; and Appendectomy.  Objective:   Vitals: BP (!) 130/50   Pulse 83   Temp 98.4 F (36.9 C) (Oral)   Resp 16   Ht 5\' 1"  (1.549 m)   Wt 150 lb 9.6 oz (68.3 kg)   SpO2 97%   BMI 28.46 kg/m   Physical Exam  Constitutional: She is oriented to person, place, and time. She appears well-developed and well-nourished.  HENT:  Mouth/Throat: Oropharynx is clear and moist.  Eyes: Right eye exhibits no discharge. Left eye exhibits no discharge. No scleral icterus.  Cardiovascular: Normal rate, regular rhythm and intact distal pulses. Exam reveals no gallop and no friction rub.  No murmur heard. Pulmonary/Chest: Effort normal and breath sounds normal. No stridor. No respiratory distress. She has no wheezes. She has no rales. She exhibits no tenderness.  Coarse lung sounds in mid-lower lung  fields bilaterally.  Neurological: She is alert and oriented to person, place, and time.  Skin: Skin is warm and dry.  Psychiatric: She has a normal mood and affect.   Assessment and Plan :   Cough  Metastatic non-small cell lung cancer ([place])  Metastatic adenocarcinoma, pathology 06/19/12  Lung mass  Patient refused to have a chest x-ray done to rule out any acute process.  I completely understand given all the work-up she has had done in monitoring with chest CTs.  I do not suspect that patient examined the chest clot given lack of tachycardia, tachypnea, shortness of breath, chest pain.  She is very well-appearing overall and is only asking for help with her cough.  I represcribed her Tessalon capsules and will also provide her with a prescription for Tylenol with codeine.  Patient agreed to set up follow-up with her primary care provider Dr. Pamella Pert in 1 week. Counseled patient on potential for adverse effects with medications prescribed today, patient verbalized understanding. ER and return-to-clinic precautions discussed, patient verbalized understanding.   Jaynee Eagles, PA-C Primary Care at [facility] [phone] 04/30/2018  3:35 PM

## 2018-04-30 NOTE — Patient Instructions (Addendum)
Tos en los adultos Cough, Adult La tos es un reflejo que despeja la garganta y las vas respiratorias. Toser ayuda a curar y [employer]. Es normal toser a Clinical cytogeneticist, pero si la tos aparece junto con otros sntomas o si dura [employer], puede indicar una enfermedad que necesita [place]. La tos puede durar solo 2 o 3semanas (aguda) o ms de 8semanas (crnica). Cules son las causas? Por lo general, las causas de la tos son las siguientes:  Aspirar sustancias que [employer]  Una infeccin respiratoria de origen viral o bacteriano.  Alergias.  Asma.  Goteo posnasal.  Fumar.  cido que vuelve del estmago hacia el esfago (reflujo gastroesofgico ).  Ciertos medicamentos.  Enfermedades pulmonares crnicas, incluida la EPOC (o rara vez, cncer de pulmn).  Otras afecciones, por ejemplo, insuficiencia cardaca.  Siga estas indicaciones en su casa: Est atento a cualquier cambio en los sntomas. Tome estas medidas para Public house manager las molestias:  Tome los medicamentos solamente como se lo haya indicado el mdico. ? Si le recetaron un antibitico, tmelo como se lo haya indicado el mdico. No deje de tomar los antibiticos aunque comience a Sports administrator. ? Hable con el mdico antes de tomar un medicamento para la tos (antitusivo).  Beba suficiente lquido para Consulting civil engineer orina clara o de color amarillo plido.  Si el aire est seco, use un vaporizador o humidificador de niebla fra en la habitacin o en la casa para ayudar a aflojar las secreciones.  Evite todo lo que le provoque tos en el trabajo o en su casa.  Si la tos empeora por la noche, pruebe a dormir en posicin semierguida.  Evite el humo del cigarrillo. Si fuma, deje de hacerlo. Si necesita ayuda para dejar de fumar, consulte al mdico.  Evite la cafena.  Evite el alcohol.  Descanse todo lo que sea necesario.  Comunquese con un mdico si:  Aparecen nuevos sntomas.  Tose y escupe  pus.  La tos no mejora despus de 2 o 3semanas o empeora.  No puede controlar la tos con antitusivos y no puede dormir debido a Presenter, broadcasting.  Siente un dolor que empeora o que no puede controlar con medicamentos.  Tiene fiebre.  Baja de peso sin causa aparente.  Tiene transpiracin nocturna. Solicite ayuda de inmediato si:  Tose y [employer].  Tiene dificultad para respirar.  El [employer] late [place] rpido. Esta informacin no tiene Marine scientist el consejo del mdico. Asegrese de hacerle al mdico cualquier pregunta que tenga. Document Released: 06/21/2011 Document Revised: 02/15/2017 Document Reviewed: 01/20/2015 Elsevier Interactive Patient Education  2018 [employer].     IF you received an x-ray today, you will receive an invoice from [facility]. Please contact [facility] at [phone] with questions or concerns regarding your invoice.   IF you received labwork today, you will receive an invoice from [facility]. Please contact [facility] at [phone] with questions or concerns regarding your invoice.   Our billing staff will not be able to assist you with questions regarding bills from these companies.  You will be contacted with the lab results as soon as they are available. The fastest way to get your results is to activate your My Chart account. Instructions are located on the last page of this paperwork. If you have not heard from [place] regarding the results in 2 weeks, please contact this office.

## 2018-05-03 ENCOUNTER — Other Ambulatory Visit: Payer: Self-pay | Admitting: Internal Medicine

## 2018-05-03 DIAGNOSIS — Z1231 Encounter for screening mammogram for malignant neoplasm of breast: Secondary | ICD-10-CM

## 2018-05-08 ENCOUNTER — Ambulatory Visit: Admission: RE | Admit: 2018-05-08 | Payer: Medicare Other | Source: Ambulatory Visit | Admitting: Radiation Oncology

## 2018-05-09 ENCOUNTER — Ambulatory Visit: Payer: Medicare Other | Admitting: Family Medicine

## 2018-05-10 ENCOUNTER — Other Ambulatory Visit: Payer: Medicare Other

## 2018-05-13 ENCOUNTER — Ambulatory Visit: Payer: Medicare Other | Admitting: Internal Medicine

## 2018-05-14 ENCOUNTER — Other Ambulatory Visit: Payer: Self-pay

## 2018-05-14 ENCOUNTER — Encounter: Payer: Self-pay | Admitting: Family Medicine

## 2018-05-14 ENCOUNTER — Ambulatory Visit (INDEPENDENT_AMBULATORY_CARE_PROVIDER_SITE_OTHER): Payer: Medicare Other | Admitting: Family Medicine

## 2018-05-14 VITALS — BP 128/64 | HR 93 | Temp 98.8°F | Ht 62.0 in | Wt 144.0 lb

## 2018-05-14 DIAGNOSIS — R630 Anorexia: Secondary | ICD-10-CM | POA: Diagnosis not present

## 2018-05-14 DIAGNOSIS — R05 Cough: Secondary | ICD-10-CM | POA: Diagnosis not present

## 2018-05-14 DIAGNOSIS — K219 Gastro-esophageal reflux disease without esophagitis: Secondary | ICD-10-CM | POA: Diagnosis not present

## 2018-05-14 DIAGNOSIS — C3492 Malignant neoplasm of unspecified part of left bronchus or lung: Secondary | ICD-10-CM | POA: Diagnosis not present

## 2018-05-14 DIAGNOSIS — R059 Cough, unspecified: Secondary | ICD-10-CM

## 2018-05-14 MED ORDER — RANITIDINE HCL 300 MG PO TABS: 300.0000 mg | ORAL_TABLET | Freq: Every day | ORAL | 2 refills | Status: DC

## 2018-05-14 MED ORDER — MIRTAZAPINE 7.5 MG PO TABS: 7.5000 mg | ORAL_TABLET | Freq: Every day | ORAL | 2 refills | Status: DC

## 2018-05-14 NOTE — Progress Notes (Signed)
6/18/201912:37 PM  Jacqueline Leonard September 06, 1946, 72 y.o. female [id number]  Chief Complaint  Patient presents with  . Cough    has been coughing for 3 months, given Tessalon with no resolve. Has taken oth otc, Nyquil and Robutussin    HPI:   Patient is a 72 y.o. female with past medical history significant for metastatic lung cancer who presents today for persistent cough for past several months She reports cough is dry, hacking, mostly at night Sipping water makes it worse She tends to sleep with her mouth open She tends to wake up with a sour metallic taste She has some cough during the day but not really concerning to her She states that she used to have horrible GERD that has gotten better with use of aloe vera She denies any nasal congestion or PND She reports no relief with tessalon pearles nor cough med with codeine She denies fever or chills  She has recently completed radiation, has lost her appetite and is asking for something to help with that She does do ensure daily Bowels are working ok  Fall Risk  05/14/2018 04/30/2018 03/07/2018 03/07/2018 02/06/2018  Falls in the past year? No No No No No     Depression screen [facility] 2/9 05/14/2018 04/30/2018 03/07/2018  Decreased Interest 0 0 0  Down, Depressed, Hopeless 0 0 0  PHQ - 2 Score 0 0 0  Some recent data might be hidden    Allergies  Allergen Reactions  . Penicillins Palpitations and Other (See Comments)    Has patient had a PCN reaction causing immediate rash, facial/tongue/throat swelling, SOB or lightheadedness with hypotension: No Has patient had a PCN reaction causing severe rash involving mucus membranes or skin necrosis: No Has patient had a PCN reaction that required hospitalization No Has patient had a PCN reaction occurring within the last 10 years: Yes If all of the above answers are "NO", then may proceed with Cephalosporin use.    Prior to Admission medications   Medication Sig Start Date End Date Taking?  Authorizing Provider  acetaminophen-codeine 120-12 MG/5ML solution Take 5 mLs by mouth every 4 (four) hours as needed (cough). 04/30/18  Yes Jaynee Eagles, PA-C  amLODipine (NORVASC) 2.5 MG tablet TAKE 1 TABLET(2.5 MG) BY MOUTH DAILY 04/24/18  Yes Rutherford Guys, MD  benzonatate (TESSALON) 100 MG capsule Take 1-2 capsules (100-200 mg total) by mouth 3 (three) times daily as needed. 04/30/18  Yes Jaynee Eagles, PA-C  cholecalciferol (VITAMIN D) 1000 UNITS tablet Take 1,000 Units by mouth daily.   Yes [provider]  CYANOCOBALAMIN PO Take 1,000 mcg by mouth daily.   Yes [provider]  FeFum-FePoly-FA-B Cmp-C-Biot (INTEGRA PLUS) CAPS Take 1 capsule by mouth every morning. 03/12/17  Yes Curt Bears, MD  osimertinib mesylate (TAGRISSO) 80 MG tablet Take 1 tablet (80 mg total) by mouth daily. 01/22/18  Yes Curt Bears, MD  potassium chloride (K-DUR) 10 MEQ tablet Take 1 tablet by mouth daily. 03/13/14  Yes [provider]    Past Medical History:  Diagnosis Date  . Allergy   . Anxiety   . Cancer, metastatic to bone ([place]) 06/19/12   bx=L 5th ribmetastatic Adenocarcinoma with known lung mass  . Encounter for antineoplastic chemotherapy 02/14/2016  . External hemorrhoids   . Hyperlipidemia   . Hypertension   . Lung mass   . Radiation 07/11/12-07/24/12   Palliative lung tx 30 gray in 10 fx  . S/P radiation therapy 07/22/14-07/31/14   left  lung/60gy/52fx  . Status post chemoradiation    Tarceva  . Vitamin D deficiency     Past Surgical History:  Procedure Laterality Date  . APPENDECTOMY    . OVARIAN CYST REMOVAL    . SOFT TISSUE BIOPSY  06/19/12   L 5th rib=metastatic adenocarcinoma    Social History   Tobacco Use  . Smoking status: Never Smoker  . Smokeless tobacco: Never Used  Substance Use Topics  . Alcohol use: No    Family History  Problem Relation Age of Onset  . Breast cancer Sister 42  . Breast cancer Sister 50  . Lung cancer Brother      ROS Per hpi  OBJECTIVE:  Blood pressure 128/64, pulse 93, temperature 98.8 F (37.1 C), temperature source Oral, height 5\' 2"  (1.575 m), weight 144 lb (65.3 kg), SpO2 97 %.  Physical Exam  Constitutional: She is oriented to person, place, and time. She appears well-developed and well-nourished.  HENT:  Head: Normocephalic and atraumatic.  Mouth/Throat: Oropharynx is clear and moist. No oropharyngeal exudate.  Eyes: Pupils are equal, round, and reactive to light. EOM are normal. No scleral icterus.  Neck: Neck supple.  Cardiovascular: Normal rate, regular rhythm and normal heart sounds. Exam reveals no gallop and no friction rub.  No murmur heard. Pulmonary/Chest: Effort normal and breath sounds normal. She has no wheezes. She has no rales.  Musculoskeletal: She exhibits no edema.  Neurological: She is alert and oriented to person, place, and time.  Skin: Skin is warm and dry.  Psychiatric: She has a normal mood and affect.  Nursing note and vitals reviewed.    ASSESSMENT and PLAN  1. Cough Persistent, uncontrolled. Given mostly nocturnal sx, will treat as silent reflux. Starting bedtime H2B. Discussed elevation HOB. Discussed LFM.  However, if related to cancer gabapentin is to be considered, would start with 300mg  at bedtime.   2. Gastroesophageal reflux disease, esophagitis presence not specified See #1  3. Decreased appetite Adding low dose Remeron. discussed new med r/s/e/b  4. Adenocarcinoma of left lung, stage 4 (HCC) Per rad and med onc  Other orders - ranitidine (ZANTAC) 300 MG tablet; Take 1 tablet (300 mg total) by mouth at bedtime. - mirtazapine (REMERON) 7.5 MG tablet; Take 1 tablet (7.5 mg total) by mouth at bedtime.  Return in about 1 month (around 06/11/2018).    Rutherford Guys, MD Primary Care at [facility] [place], [place] [address] Ph.  [phone] Fax [phone]

## 2018-05-14 NOTE — Patient Instructions (Signed)
     IF you received an x-ray today, you will receive an invoice from [facility]. Please contact [facility] at [phone] with questions or concerns regarding your invoice.   IF you received labwork today, you will receive an invoice from [facility]. Please contact [facility] at [phone] with questions or concerns regarding your invoice.   Our billing staff will not be able to assist you with questions regarding bills from these companies.  You will be contacted with the lab results as soon as they are available. The fastest way to get your results is to activate your My Chart account. Instructions are located on the last page of this paperwork. If you have not heard from us regarding the results in 2 weeks, please contact this office.     

## 2018-05-17 ENCOUNTER — Inpatient Hospital Stay: Payer: Medicare Other | Attending: Internal Medicine

## 2018-05-17 ENCOUNTER — Encounter (HOSPITAL_COMMUNITY): Payer: Self-pay

## 2018-05-17 ENCOUNTER — Ambulatory Visit (HOSPITAL_COMMUNITY)
Admission: RE | Admit: 2018-05-17 | Discharge: 2018-05-17 | Disposition: A | Discharge status: Home / Self Care | Payer: Medicare Other | Source: Ambulatory Visit | Attending: Internal Medicine | Admitting: Internal Medicine

## 2018-05-17 DIAGNOSIS — J90 Pleural effusion, not elsewhere classified: Secondary | ICD-10-CM | POA: Insufficient documentation

## 2018-05-17 DIAGNOSIS — M899 Disorder of bone, unspecified: Secondary | ICD-10-CM | POA: Insufficient documentation

## 2018-05-17 DIAGNOSIS — R634 Abnormal weight loss: Secondary | ICD-10-CM | POA: Diagnosis not present

## 2018-05-17 DIAGNOSIS — C3490 Malignant neoplasm of unspecified part of unspecified bronchus or lung: Secondary | ICD-10-CM | POA: Insufficient documentation

## 2018-05-17 DIAGNOSIS — C3432 Malignant neoplasm of lower lobe, left bronchus or lung: Secondary | ICD-10-CM | POA: Diagnosis not present

## 2018-05-17 DIAGNOSIS — C7951 Secondary malignant neoplasm of bone: Secondary | ICD-10-CM | POA: Insufficient documentation

## 2018-05-17 DIAGNOSIS — J984 Other disorders of lung: Secondary | ICD-10-CM | POA: Diagnosis not present

## 2018-05-17 DIAGNOSIS — R05 Cough: Secondary | ICD-10-CM | POA: Diagnosis not present

## 2018-05-17 LAB — CBC WITH DIFFERENTIAL (CANCER CENTER ONLY)
BASOS ABS: 0 10*3/uL (ref 0.0–0.1)
BASOS PCT: 0 %
EOS ABS: 0.1 10*3/uL (ref 0.0–0.5)
Eosinophils Relative: 1 %
HCT: 33.2 % — ABNORMAL LOW (ref 34.8–46.6)
Hemoglobin: 10.7 g/dL — ABNORMAL LOW (ref 11.6–15.9)
Lymphocytes Relative: 8 %
Lymphs Abs: 0.6 10*3/uL — ABNORMAL LOW (ref 0.9–3.3)
MCH: 28.5 pg (ref 25.1–34.0)
MCHC: 32.2 g/dL (ref 31.5–36.0)
MCV: 88.5 fL (ref 79.5–101.0)
MONO ABS: 0.7 10*3/uL (ref 0.1–0.9)
MONOS PCT: 10 %
NEUTROS PCT: 81 %
Neutro Abs: 5.3 10*3/uL (ref 1.5–6.5)
Platelet Count: 132 10*3/uL — ABNORMAL LOW (ref 145–400)
RBC: 3.75 MIL/uL (ref 3.70–5.45)
RDW: 13.9 % (ref 11.2–14.5)
WBC Count: 6.6 10*3/uL (ref 3.9–10.3)

## 2018-05-17 LAB — CMP (CANCER CENTER ONLY)
ALK PHOS: 104 U/L (ref 40–150)
ALT: 29 U/L (ref 0–55)
ANION GAP: 7 (ref 3–11)
AST: 41 U/L — ABNORMAL HIGH (ref 5–34)
Albumin: 3.2 g/dL — ABNORMAL LOW (ref 3.5–5.0)
BILIRUBIN TOTAL: 0.4 mg/dL (ref 0.2–1.2)
BUN: 20 mg/dL (ref 7–26)
CALCIUM: 9.7 mg/dL (ref 8.4–10.4)
CO2: 29 mmol/L (ref 22–29)
CREATININE: 0.91 mg/dL (ref 0.60–1.10)
Chloride: 103 mmol/L (ref 98–109)
GFR, Estimated: >60 mL/min (ref >60)
GLUCOSE: 96 mg/dL (ref 70–140)
Potassium: 4.6 mmol/L (ref 3.5–5.1)
SODIUM: 139 mmol/L (ref 136–145)
TOTAL PROTEIN: 8.1 g/dL (ref 6.4–8.3)

## 2018-05-17 MED ORDER — IOPAMIDOL (ISOVUE-300) INJECTION 61%
100.0000 mL | Freq: Once | INTRAVENOUS | Status: AC | PRN
  Administered 2018-05-17: 100 mL via INTRAVENOUS

## 2018-05-17 MED ORDER — IOPAMIDOL (ISOVUE-300) INJECTION 61%
INTRAVENOUS | Status: AC
  Filled 2018-05-17: qty 100

## 2018-05-17 NOTE — Assessment & Plan Note (Addendum)
This is a very pleasant 72 year old Hispanic female with a stage IV non-small cell lung cancer, adenocarcinoma with positive EGFR mutation with deletion in exon 19 diagnosed in July 2013 status post 3 years treatment with Tarceva discontinued secondary to disease progression and development of T7 79 M EGFR resistant mutation. The patient was started on treatment with Tagrisso 80 mg by mouth daily status post 27 months. She is tolerating her treatment with Tagrisso fairly well.  The patient also underwent palliative radiotherapy to the enlarging pulmonary nodules.  She completed this on 03/28/2018. She had a recent restaging CT scan of the chest, abdomen, pelvis and is here to discuss the results.  The patient was seen with Dr. Julien Nordmann.  The CT scan results were discussed with the patient and her niece.  We discussed that there appears to be some progression but is difficult to tell if this is inflammation versus progression of the tumor.  Will send the patient back to the lab today for guardant 360 testing to see if she is developed any resistant or additional mutations.  She was given a Medrol Dosepak to help her cough and possible inflammation in her lungs.  She will continue on [place] for now.  She will follow-up in 1 month for evaluation and repeat lab work and to discuss her guardant 360 results.  For weight loss and decreased appetite, she was advised to increase her protein shakes to 2-3 times per day.  I referred her to the dietitian.  She was given a prescription for Remeron by her primary care provider but has not yet tried to take this.  Discussed with patient that she should start this medication.  She was given a low dose at 7.5 mg and she was advised that she may increase this up to 15 or 30 mg.  For cough, we advised her to use Delsym over-the-counter.   She was advised to call immediately if she has any concerning symptoms in the interval. All questions were answered. The patient knows to  call the clinic with any problems, questions or concerns. We can certainly see the patient much sooner if necessary.

## 2018-05-17 NOTE — Progress Notes (Signed)
[facility] OFFICE PROGRESS NOTE  Rutherford Guys, MD [address] [place] [address]  DIAGNOSIS: Metastatic non-small cell lung cancer, adenocarcinoma with positive EGFR mutation in exon 19 and development of resistant T790M mutation. This was initially diagnosed in July 2013.  PRIOR THERAPY: 1) Status post radiotherapy to the left face rib metastasis under the care of Dr. Lisbeth Renshaw.  2) stereotactic radiotherapy to the enlarging left lower lobe lung nodule under the care of Dr. Lisbeth Renshaw. 3) treatment with Tarceva 150 mg by mouth daily, therapy beginning 07/20/2012. Status post approximately 36 months of therapy. This was discontinued secondary to disease progression. 4) stereotactic radiotherapy to enlarging pulmonary nodules under the care of Dr. Lisbeth Renshaw.  CURRENT THERAPY: 1) Tagrisso 80 mg by mouth daily started 02/03/2016 status post more than 27 months of treatment. 2) Xgeva 120 mg subcutaneously every 4 weeks for bone metastasis.  INTERVAL HISTORY: Jacqueline Leonard 72 y.o. female returns for routine follow-up visit accompanied by her niece.  The patient reports that she is not feeling well is having more fatigue and nonproductive cough.  She also has a decreased appetite and has lost weight.  She reports that nothing is really helping her cough anymore.  She is tried cough medication with codeine and Tessalon Perles.  She has not had any fevers or chills.  Denies chest pain, shortness of breath, hemoptysis.  Denies nausea, vomiting, constipation, diarrhea.  Denies skin rashes.  The patient has lost 7 pounds since her last visit.  She reports that she drinks a protein shake once a day.  The patient had a restaging CT scan and is here to discuss the results.  MEDICAL HISTORY: Past Medical History:  Diagnosis Date  . Allergy   . Anxiety   . Cancer, metastatic to bone ([place]) 06/19/12   bx=L 5th ribmetastatic Adenocarcinoma with known lung mass  . Encounter for antineoplastic  chemotherapy 02/14/2016  . External hemorrhoids   . Hyperlipidemia   . Hypertension   . Lung mass   . Radiation 07/11/12-07/24/12   Palliative lung tx 30 gray in 10 fx  . S/P radiation therapy 07/22/14-07/31/14   left lung/60gy/103f  . Status post chemoradiation    Tarceva  . Vitamin D deficiency     ALLERGIES:  is allergic to penicillins.  MEDICATIONS:  Current Outpatient Medications  Medication Sig Dispense Refill  . amLODipine (NORVASC) 2.5 MG tablet TAKE 1 TABLET(2.5 MG) BY MOUTH DAILY 90 tablet 0  . benzonatate (TESSALON) 100 MG capsule Take 1-2 capsules (100-200 mg total) by mouth 3 (three) times daily as needed. 60 capsule 0  . cholecalciferol (VITAMIN D) 1000 UNITS tablet Take 1,000 Units by mouth daily.    . CYANOCOBALAMIN PO Take 1,000 mcg by mouth daily.    .Marland KitchenFeFum-FePoly-FA-B Cmp-C-Biot (INTEGRA PLUS) CAPS Take 1 capsule by mouth every morning. 30 capsule 2  . methylPREDNISolone (MEDROL) 4 MG tablet Take 6 tabs on day 1, 5 tabs on day 2, 4 tabs on day 3, 3 tabs on day 4, 2 tabs on day 5, 1 tab on day 6, and then stop 21 tablet 0  . mirtazapine (REMERON) 7.5 MG tablet Take 1 tablet (7.5 mg total) by mouth at bedtime. 30 tablet 2  . osimertinib mesylate (TAGRISSO) 80 MG tablet Take 1 tablet (80 mg total) by mouth daily. 30 tablet 2   No current facility-administered medications for this visit.     SURGICAL HISTORY:  Past Surgical History:  Procedure Laterality Date  .  APPENDECTOMY    . OVARIAN CYST REMOVAL    . SOFT TISSUE BIOPSY  06/19/12   L 5th rib=metastatic adenocarcinoma    REVIEW OF SYSTEMS:   Review of Systems  Constitutional: Negative for chills, fever. Positive for fatigue, decreased appetite, and weight loss. HENT:   Negative for mouth sores, nosebleeds, sore throat and trouble swallowing.   Eyes: Negative for eye problems and icterus.  Respiratory: Negative for hemoptysis, shortness of breath and wheezing.  Positive for nonproductive cough. Cardiovascular:  Negative for chest pain and leg swelling.  Gastrointestinal: Negative for abdominal pain, constipation, diarrhea, nausea and vomiting.  Genitourinary: Negative for bladder incontinence, difficulty urinating, dysuria, frequency and hematuria.   Musculoskeletal: Negative for back pain, gait problem, neck pain and neck stiffness.  Skin: Negative for itching and rash.  Neurological: Negative for dizziness, extremity weakness, gait problem, headaches, light-headedness and seizures.  Hematological: Negative for adenopathy. Does not bruise/bleed easily.  Psychiatric/Behavioral: Negative for confusion, depression and sleep disturbance. The patient is not nervous/anxious.     PHYSICAL EXAMINATION:  Blood pressure (!) 142/58, pulse 85, temperature 98.2 F (36.8 C), temperature source Oral, resp. rate 18, height _0  (1.575 m), weight 145 lb 14.4 oz (66.2 kg), SpO2 99 %.  ECOG PERFORMANCE STATUS: 1 - Symptomatic but completely ambulatory  Physical Exam  Constitutional: Oriented to person, place, and time and well-developed, well-nourished, and in no distress. No distress.  HENT:  Head: Normocephalic and atraumatic.  Mouth/Throat: Oropharynx is clear and moist. No oropharyngeal exudate.  Eyes: Conjunctivae are normal. Right eye exhibits no discharge. Left eye exhibits no discharge. No scleral icterus.  Neck: Normal range of motion. Neck supple.  Cardiovascular: Normal rate, regular rhythm, normal heart sounds and intact distal pulses.   Pulmonary/Chest: Effort normal and breath sounds normal. No respiratory distress. No wheezes. No rales.  Abdominal: Soft. Bowel sounds are normal. Exhibits no distension and no mass. There is no tenderness.  Musculoskeletal: Normal range of motion. Exhibits no edema.  Lymphadenopathy:    No cervical adenopathy.  Neurological: Alert and oriented to person, place, and time. Exhibits normal muscle tone. Gait normal. Coordination normal.  Skin: Skin is warm and dry. No  rash noted. Not diaphoretic. No erythema. No pallor.  Psychiatric: Mood, memory and judgment normal.  Vitals reviewed.  LABORATORY DATA: Lab Results  Component Value Date   WBC 4.7 05/20/2018   HGB 10.8 (L) 05/20/2018   HCT 32.9 (L) 05/20/2018   MCV 86.8 05/20/2018   PLT 135 (L) 05/20/2018      Chemistry      Component Value Date/Time   NA 139 05/20/2018 1123   NA 140 11/01/2017 1423   K 3.9 05/20/2018 1123   K 3.7 11/01/2017 1423   CL 104 05/20/2018 1123   CL 102 05/06/2013 1511   CO2 28 05/20/2018 1123   CO2 25 11/01/2017 1423   BUN 13 05/20/2018 1123   BUN 21.2 11/01/2017 1423   CREATININE 0.82 05/20/2018 1123   CREATININE 0.91 05/17/2018 1359   CREATININE 1.0 11/01/2017 1423      Component Value Date/Time   CALCIUM 9.5 05/20/2018 1123   CALCIUM 9.3 11/01/2017 1423   ALKPHOS 102 05/20/2018 1123   ALKPHOS 81 11/01/2017 1423   AST 37 (H) 05/20/2018 1123   AST 41 (H) 05/17/2018 1359   AST 30 11/01/2017 1423   ALT 30 05/20/2018 1123   ALT 29 05/17/2018 1359   ALT 21 11/01/2017 1423   BILITOT 0.5 05/20/2018 1123  BILITOT 0.4 05/17/2018 1359   BILITOT 0.45 11/01/2017 1423       RADIOGRAPHIC STUDIES:  Ct Chest W Contrast  Result Date: 05/17/2018 CLINICAL DATA:  Restaging non-small cell lung cancer. EXAM: CT CHEST, ABDOMEN, AND PELVIS WITH CONTRAST TECHNIQUE: Multidetector CT imaging of the chest, abdomen and pelvis was performed following the standard protocol during bolus administration of intravenous contrast. CONTRAST:  148m ISOVUE-300 IOPAMIDOL (ISOVUE-300) INJECTION 61% COMPARISON:  02/27/2018 FINDINGS: CT CHEST FINDINGS Cardiovascular: The heart is normal in size. No pericardial effusion. The aorta is normal in caliber. No dissection. Mediastinum/Nodes: Stable scattered mediastinal and hilar lymph nodes. No mass or overt adenopathy. Lungs/Pleura: Significant progression of lung disease. Markedly progressive airspace consolidation, interstitial thickening and  nodularity in the right upper lobe and superior segment of the right lower lobe. Difficult to tell what is radiation change in what is interstitial and or endobronchial spread of tumor. Numerous small indistinct pulmonary nodules are noted and are worrisome for metastatic lesions. Persistent dense left lower lobe airspace consolidation with persistent central masslike lesion and adjacent left pleural effusion. The mass measures 5.5 x 3.8 cm previously measured 5.2 x 3.6 cm. Significant progression of interstitial changes along with pleural and parenchymal nodularity worrisome for metastatic disease. Certainly some of the changes in the left lower lobe could be due to radiation. Musculoskeletal: Stable expansile left fourth rib lesion. Several areas of mixed lytic and sclerotic changes likely treated disease. There also several spinal hemangiomas. No new lesions. CT ABDOMEN PELVIS FINDINGS Hepatobiliary: No findings to suggest hepatic metastatic disease. Stable cirrhotic changes and paraesophageal varices. Pancreas: No mass, inflammation or ductal dilatation. Spleen: Normal size.  No focal lesions. Adrenals/Urinary Tract: No evidence of adrenal gland metastasis. Both kidneys are unremarkable and stable. The bladder is unremarkable. Stomach/Bowel: The stomach, duodenum, small bowel and colon are grossly normal. No inflammatory changes, mass lesions or obstructive findings. The terminal ileum and appendix are normal. Vascular/Lymphatic: Stable vascular calcifications. No aneurysm or dissection. No mesenteric or retroperitoneal mass or adenopathy. Reproductive: Uterine fibroids again noted.  The ovaries are normal. Other: No pelvic mass or adenopathy. No free pelvic fluid collections. No inguinal mass or adenopathy. No abdominal wall hernia or subcutaneous lesions. Musculoskeletal: No obvious metastatic bone lesions. Stable bilateral pars defects at L5. IMPRESSION: 1. Progressive lung disease, possibly a combination of  radiation fibrosis and interstitial and bronchial spread of tumor most notably involving the right upper lobe, superior segment right lower lobe and the left lower lobe. 2. Persistent left pleural effusion. 3. No definite CT findings for abdominal/pelvic metastatic disease. 4. Stable expansile left fourth rib lesion. There also several other areas of mixed bony sclerosis. Electronically Signed   By: PMarijo SanesM.D.   On: 05/17/2018 19:52   Ct Abdomen Pelvis W Contrast  Result Date: 05/17/2018 CLINICAL DATA:  Restaging non-small cell lung cancer. EXAM: CT CHEST, ABDOMEN, AND PELVIS WITH CONTRAST TECHNIQUE: Multidetector CT imaging of the chest, abdomen and pelvis was performed following the standard protocol during bolus administration of intravenous contrast. CONTRAST:  1083mISOVUE-300 IOPAMIDOL (ISOVUE-300) INJECTION 61% COMPARISON:  02/27/2018 FINDINGS: CT CHEST FINDINGS Cardiovascular: The heart is normal in size. No pericardial effusion. The aorta is normal in caliber. No dissection. Mediastinum/Nodes: Stable scattered mediastinal and hilar lymph nodes. No mass or overt adenopathy. Lungs/Pleura: Significant progression of lung disease. Markedly progressive airspace consolidation, interstitial thickening and nodularity in the right upper lobe and superior segment of the right lower lobe. Difficult to tell what is radiation change  in what is interstitial and or endobronchial spread of tumor. Numerous small indistinct pulmonary nodules are noted and are worrisome for metastatic lesions. Persistent dense left lower lobe airspace consolidation with persistent central masslike lesion and adjacent left pleural effusion. The mass measures 5.5 x 3.8 cm previously measured 5.2 x 3.6 cm. Significant progression of interstitial changes along with pleural and parenchymal nodularity worrisome for metastatic disease. Certainly some of the changes in the left lower lobe could be due to radiation. Musculoskeletal: Stable  expansile left fourth rib lesion. Several areas of mixed lytic and sclerotic changes likely treated disease. There also several spinal hemangiomas. No new lesions. CT ABDOMEN PELVIS FINDINGS Hepatobiliary: No findings to suggest hepatic metastatic disease. Stable cirrhotic changes and paraesophageal varices. Pancreas: No mass, inflammation or ductal dilatation. Spleen: Normal size.  No focal lesions. Adrenals/Urinary Tract: No evidence of adrenal gland metastasis. Both kidneys are unremarkable and stable. The bladder is unremarkable. Stomach/Bowel: The stomach, duodenum, small bowel and colon are grossly normal. No inflammatory changes, mass lesions or obstructive findings. The terminal ileum and appendix are normal. Vascular/Lymphatic: Stable vascular calcifications. No aneurysm or dissection. No mesenteric or retroperitoneal mass or adenopathy. Reproductive: Uterine fibroids again noted.  The ovaries are normal. Other: No pelvic mass or adenopathy. No free pelvic fluid collections. No inguinal mass or adenopathy. No abdominal wall hernia or subcutaneous lesions. Musculoskeletal: No obvious metastatic bone lesions. Stable bilateral pars defects at L5. IMPRESSION: 1. Progressive lung disease, possibly a combination of radiation fibrosis and interstitial and bronchial spread of tumor most notably involving the right upper lobe, superior segment right lower lobe and the left lower lobe. 2. Persistent left pleural effusion. 3. No definite CT findings for abdominal/pelvic metastatic disease. 4. Stable expansile left fourth rib lesion. There also several other areas of mixed bony sclerosis. Electronically Signed   By: Marijo Sanes M.D.   On: 05/17/2018 19:52     ASSESSMENT/PLAN:  Adenocarcinoma of left lung, stage 4 (HCC) This is a very pleasant 72 year old Hispanic female with a stage IV non-small cell lung cancer, adenocarcinoma with positive EGFR mutation with deletion in exon 19 diagnosed in July 2013 status  post 3 years treatment with Tarceva discontinued secondary to disease progression and development of T7 40 M EGFR resistant mutation. The patient was started on treatment with Tagrisso 80 mg by mouth daily status post 27 months. She is tolerating her treatment with Tagrisso fairly well.  The patient also underwent palliative radiotherapy to the enlarging pulmonary nodules.  She completed this on 03/28/2018. She had a recent restaging CT scan of the chest, abdomen, pelvis and is here to discuss the results.  The patient was seen with Dr. Julien Nordmann.  The CT scan results were discussed with the patient and her niece.  We discussed that there appears to be some progression but is difficult to tell if this is inflammation versus progression of the tumor.  Will send the patient back to the lab today for guardant 360 testing to see if she is developed any resistant or additional mutations.  She was given a Medrol Dosepak to help her cough and possible inflammation in her lungs.  She will continue on [place] for now.  She will follow-up in 1 month for evaluation and repeat lab work and to discuss her guardant 360 results.  For weight loss and decreased appetite, she was advised to increase her protein shakes to 2-3 times per day.  I referred her to the dietitian.  She was  given a prescription for Remeron by her primary care provider but has not yet tried to take this.  Discussed with patient that she should start this medication.  She was given a low dose at 7.5 mg and she was advised that she may increase this up to 15 or 30 mg.  For cough, we advised her to use Delsym over-the-counter.   She was advised to call immediately if she has any concerning symptoms in the interval. All questions were answered. The patient knows to call the clinic with any problems, questions or concerns. We can certainly see the patient much sooner if necessary.   Orders Placed This Encounter  Procedures  . Guardant 360    Standing  Status:   Future    Number of Occurrences:   1    Standing Expiration Date:   05/20/2019  . CBC with Differential (Cancer Center Only)    Standing Status:   Future    Standing Expiration Date:   05/21/2019  . CMP ([facility] only)    Standing Status:   Future    Standing Expiration Date:   05/21/2019  . Amb Referral to Nutrition and Diabetic E    Referral Priority:   Routine    Referral Type:   Consultation    Referral Reason:   Specialty Services Required    Number of Visits Requested:   1   Mikey Bussing, DNP, AGPCNP-BC, AOCNP 05/20/18  ADDENDUM: Hematology/Oncology Attending: I had a face-to-face encounter with the patient today.  I recommended her care plan.  This is a very pleasant 72 years old Hispanic female with metastatic non-small cell lung cancer, adenocarcinoma with positive EGFR mutation with deletion in exon 58 diagnosed in July 2013.  She is status post 3 years treatment with Tarceva discontinued secondary to disease progression and development of T790M resistant EGFR mutation. The patient was then started on treatment with Tagrisso 80 mg p.o. daily status post 27 months of treatment and has been tolerating this treatment fairly well.  She was found on previous CT scan of the chest to have evidence for disease progression with increase in size of pulmonary nodules.  The patient was referred to radiation oncology and she underwent stereotactic radiotherapy to the enlarging pulmonary nodules. She had repeat CT scan of the chest, abdomen and pelvis performed recently that showed increased density in the areas where the palliative radiotherapy was giving.  This is still concerning for disease progression versus radiation induced pneumonitis.  The patient continues to have persistent dry cough. I personally and independently reviewed the scan images and discussed the results with the patient and her niece. I recommended for her to continue her current treatment with Tagrisso for  now.  I will send blood test to Guardant 360 for evaluation and to rule out the development of any other mutations. For the dry cough she was advised to use Delsym over-the-counter every 12 hours. I will see her back for follow-up visit in 1 months for evaluation and repeat blood work as well as discussion of the pending molecular studies. The patient was advised to call immediately if she has any concerning symptoms in the interval.  Disclaimer: This note was dictated with voice recognition software. Similar sounding words can inadvertently be transcribed and may be missed upon review. [name], MD 05/20/18

## 2018-05-20 ENCOUNTER — Other Ambulatory Visit: Payer: Medicare Other

## 2018-05-20 ENCOUNTER — Encounter: Payer: Self-pay | Admitting: Internal Medicine

## 2018-05-20 ENCOUNTER — Inpatient Hospital Stay (HOSPITAL_BASED_OUTPATIENT_CLINIC_OR_DEPARTMENT_OTHER): Payer: Medicare Other | Admitting: Oncology

## 2018-05-20 ENCOUNTER — Ambulatory Visit: Payer: Medicare Other | Admitting: Gynecology

## 2018-05-20 ENCOUNTER — Ambulatory Visit (INDEPENDENT_AMBULATORY_CARE_PROVIDER_SITE_OTHER): Payer: Medicare Other | Admitting: Gynecology

## 2018-05-20 ENCOUNTER — Inpatient Hospital Stay: Payer: Medicare Other

## 2018-05-20 ENCOUNTER — Other Ambulatory Visit: Payer: Self-pay | Admitting: Gynecology

## 2018-05-20 ENCOUNTER — Encounter: Payer: Self-pay | Admitting: Oncology

## 2018-05-20 ENCOUNTER — Encounter: Payer: Self-pay | Admitting: Gynecology

## 2018-05-20 ENCOUNTER — Telehealth: Payer: Self-pay | Admitting: Internal Medicine

## 2018-05-20 ENCOUNTER — Ambulatory Visit (INDEPENDENT_AMBULATORY_CARE_PROVIDER_SITE_OTHER): Payer: Medicare Other

## 2018-05-20 VITALS — BP 120/76

## 2018-05-20 VITALS — BP 142/58 | HR 85 | Temp 98.2°F | Resp 18 | Ht 62.0 in | Wt 145.9 lb

## 2018-05-20 DIAGNOSIS — N949 Unspecified condition associated with female genital organs and menstrual cycle: Secondary | ICD-10-CM

## 2018-05-20 DIAGNOSIS — C3492 Malignant neoplasm of unspecified part of left bronchus or lung: Secondary | ICD-10-CM

## 2018-05-20 DIAGNOSIS — C7951 Secondary malignant neoplasm of bone: Secondary | ICD-10-CM | POA: Diagnosis not present

## 2018-05-20 DIAGNOSIS — C3432 Malignant neoplasm of lower lobe, left bronchus or lung: Secondary | ICD-10-CM

## 2018-05-20 DIAGNOSIS — N859 Noninflammatory disorder of uterus, unspecified: Secondary | ICD-10-CM

## 2018-05-20 DIAGNOSIS — N9489 Other specified conditions associated with female genital organs and menstrual cycle: Secondary | ICD-10-CM | POA: Diagnosis not present

## 2018-05-20 DIAGNOSIS — R05 Cough: Secondary | ICD-10-CM | POA: Diagnosis not present

## 2018-05-20 DIAGNOSIS — C799 Secondary malignant neoplasm of unspecified site: Secondary | ICD-10-CM

## 2018-05-20 DIAGNOSIS — D251 Intramural leiomyoma of uterus: Secondary | ICD-10-CM

## 2018-05-20 DIAGNOSIS — C7801 Secondary malignant neoplasm of right lung: Secondary | ICD-10-CM

## 2018-05-20 DIAGNOSIS — C7952 Secondary malignant neoplasm of bone marrow: Secondary | ICD-10-CM

## 2018-05-20 DIAGNOSIS — R634 Abnormal weight loss: Secondary | ICD-10-CM

## 2018-05-20 DIAGNOSIS — Z5111 Encounter for antineoplastic chemotherapy: Secondary | ICD-10-CM

## 2018-05-20 LAB — COMPREHENSIVE METABOLIC PANEL
ALBUMIN: 3 g/dL — AB (ref 3.5–5.0)
ALK PHOS: 102 U/L (ref 40–150)
ALT: 30 U/L (ref 0–55)
ANION GAP: 7 (ref 3–11)
AST: 37 U/L — ABNORMAL HIGH (ref 5–34)
BILIRUBIN TOTAL: 0.5 mg/dL (ref 0.2–1.2)
BUN: 13 mg/dL (ref 7–26)
CALCIUM: 9.5 mg/dL (ref 8.4–10.4)
CO2: 28 mmol/L (ref 22–29)
CREATININE: 0.82 mg/dL (ref 0.60–1.10)
Chloride: 104 mmol/L (ref 98–109)
GFR calc Af Amer: >60 mL/min (ref >60)
GFR calc non Af Amer: >60 mL/min (ref >60)
GLUCOSE: 96 mg/dL (ref 70–140)
Potassium: 3.9 mmol/L (ref 3.5–5.1)
Sodium: 139 mmol/L (ref 136–145)
TOTAL PROTEIN: 7.9 g/dL (ref 6.4–8.3)

## 2018-05-20 LAB — CBC WITH DIFFERENTIAL/PLATELET
BASOS PCT: 0 %
Basophils Absolute: 0 10*3/uL (ref 0.0–0.1)
EOS ABS: 0 10*3/uL (ref 0.0–0.5)
Eosinophils Relative: 1 %
HEMATOCRIT: 32.9 % — AB (ref 34.8–46.6)
HEMOGLOBIN: 10.8 g/dL — AB (ref 11.6–15.9)
LYMPHS ABS: 0.3 10*3/uL — AB (ref 0.9–3.3)
Lymphocytes Relative: 7 %
MCH: 28.3 pg (ref 25.1–34.0)
MCHC: 32.6 g/dL (ref 31.5–36.0)
MCV: 86.8 fL (ref 79.5–101.0)
MONO ABS: 0.5 10*3/uL (ref 0.1–0.9)
MONOS PCT: 10 %
NEUTROS ABS: 3.9 10*3/uL (ref 1.5–6.5)
Neutrophils Relative %: 82 %
Platelets: 135 10*3/uL — ABNORMAL LOW (ref 145–400)
RBC: 3.79 MIL/uL (ref 3.70–5.45)
RDW: 15 % — AB (ref 11.2–14.5)
WBC: 4.7 10*3/uL (ref 3.9–10.3)

## 2018-05-20 MED ORDER — METHYLPREDNISOLONE 4 MG PO TABS: ORAL_TABLET | ORAL | 0 refills | Status: DC

## 2018-05-20 NOTE — Telephone Encounter (Signed)
Appointments scheduled AVS/Calendar printed per 6/24 los

## 2018-05-20 NOTE — Progress Notes (Signed)
Patient came in along with someone to interpret for her requesting assistance with several bills she has.  Reveiwed bills she brought in. Advised patient she may apply for hardship settlement once her balance exceeds $5000. Gave her the application and highlit what to complete as well as supporting documents that needed to be provided. They verbalized understanding.  Advised for the bill she received through [place] [facility], she would have to contact the number on the bill to discuss arrangements. They verbalized understanding.

## 2018-05-20 NOTE — Progress Notes (Signed)
    Jacqueline Leonard 02/13/1946 [id number]        72 y.o.  G0P0000 presents for follow-up ultrasound with her [facility] provided interpreter.  Initially was seen after CT scan for her stage IV lung cancer of the abdomen showed uterine fibroids as well as endometrial thickening up to 1.1 cm.  Follow-up ultrasound showed the endometrium at 1.5 cm with a complex cystic area in the fundal region of the uterus.  She subsequently had repeat of her ultrasound here for 22 2019 which showed a cystic area above the calcified myoma measuring 19 x 17 mm.  Sonohysterogram was attempted but I was unable to enter the upper endometrium where the cystic area was due to obstruction of the endometrial canal by the calcified myoma.  Endometrial sample showed no endometrial tissue.  Options to include D&C versus repeat ultrasound was discussed with the patient we both agreed at that point to repeat her ultrasound in 2 months.  Past medical history,surgical history, problem list, medications, allergies, family history and social history were all reviewed and documented in the EPIC chart.  Directed ROS with pertinent positives and negatives documented in the history of present illness/assessment and plan.  Exam: Vitals:   05/20/18 1535  BP: 120/76   General appearance:  Normal  Ultrasound transvaginal shows uterus retroverted normal in size with calcified myoma 27 mm and 2 separate intramural myomas 79mm and 14 mm.  A cystic/solid mass in the fundal region 17 x 14 mm noted with color flow Doppler.  Right and left ovaries visualized and normal.  Cul-de-sac negative.  Assessment/Plan:  72 y.o. G0P0000 with cystic solid area of fundal region of the uterus with color flow Doppler found initially incidentally with CT scan for her stage IV lung cancer.  Follow-up ultrasounds suspicious for endometrial process such as endometrial polyp.  Biopsy of this area attempted but unable to negotiate the myoma.  I reviewed with the  patient our 2 options would be to continue to follow this area recognizing we do not know the histology and that it could be benign endometrium, a benign polyp up to and including endometrial cancer.  Second option would be to proceed with hysteroscopy D&C at this point.  The pros and cons of each choice were reviewed to include the risks of the surgery and a stage IV lung cancer patient and whether pathology of the endometrium would make a difference long-term versus missed pathology that possible intervention at this point may make a difference.  After a lengthy discussion we both agree on expectant management with follow-up ultrasound in 6 months.  If this area remains stable then we will continue to watch it.  If it enlarges then will proceed with hysteroscopy D&C depending on how her general health status is at that time.  I spent a total of 15 face-to-face minutes with the patient, over 50% was spent counseling and coordination of care.     Anastasio Auerbach MD, 4:41 PM 05/20/2018

## 2018-05-20 NOTE — Patient Instructions (Signed)
Follow up for ultrasound in 6 months

## 2018-05-22 ENCOUNTER — Telehealth: Payer: Self-pay | Admitting: Medical Oncology

## 2018-05-22 ENCOUNTER — Other Ambulatory Visit: Payer: Self-pay | Admitting: Medical Oncology

## 2018-05-22 DIAGNOSIS — C3490 Malignant neoplasm of unspecified part of unspecified bronchus or lung: Secondary | ICD-10-CM

## 2018-05-22 MED ORDER — OSIMERTINIB MESYLATE 80 MG PO TABS: 80.0000 mg | ORAL_TABLET | Freq: Every day | ORAL | 2 refills | Status: DC

## 2018-05-22 MED ORDER — OSIMERTINIB MESYLATE 80 MG PO TABS
80.0000 mg | ORAL_TABLET | Freq: Every day | ORAL | 3 refills | Status: DC
Start: 2018-05-22 — End: 2018-05-22

## 2018-05-22 NOTE — Telephone Encounter (Signed)
Refill requested

## 2018-05-24 ENCOUNTER — Ambulatory Visit
Admission: RE | Admit: 2018-05-24 | Discharge: 2018-05-24 | Disposition: A | Discharge status: Home / Self Care | Payer: Medicare Other | Source: Ambulatory Visit | Attending: Internal Medicine | Admitting: Internal Medicine

## 2018-05-24 DIAGNOSIS — Z1231 Encounter for screening mammogram for malignant neoplasm of breast: Secondary | ICD-10-CM | POA: Diagnosis not present

## 2018-05-28 DIAGNOSIS — C3492 Malignant neoplasm of unspecified part of left bronchus or lung: Secondary | ICD-10-CM | POA: Diagnosis not present

## 2018-06-03 ENCOUNTER — Inpatient Hospital Stay: Payer: Medicare Other | Attending: Internal Medicine | Admitting: Nutrition

## 2018-06-03 DIAGNOSIS — C3490 Malignant neoplasm of unspecified part of unspecified bronchus or lung: Secondary | ICD-10-CM | POA: Insufficient documentation

## 2018-06-03 DIAGNOSIS — C7951 Secondary malignant neoplasm of bone: Secondary | ICD-10-CM | POA: Insufficient documentation

## 2018-06-03 DIAGNOSIS — R05 Cough: Secondary | ICD-10-CM | POA: Insufficient documentation

## 2018-06-03 NOTE — Progress Notes (Signed)
72 year old female diagnosed with metastatic lung cancer in July 2013. She is a patient of Dr. Julien Nordmann.  Past medical history includes vitamin D deficiency, hypertension, hyperlipidemia, and anxiety.  Medications include vitamin D, vitamin B12, Remeron, and Medrol Dosepak.  Labs include albumin 3.0 on June 24  Height: 5 feet 2 inches. Weight: 146.4 pounds. Usual body weight: 155 pounds. BMI: 26.78.  I spoke with patient using video interpreter. Patient reports poor appetite since finishing radiation therapy. She reports a 10 pound weight loss from usual body weight. She has been drinking 1 Premier protein shake providing 160 cal and 30 g protein. She typically eats an egg with ham and a glass of milk at breakfast, codfish and rice for lunch and a sandwich for dinner.   Reports last bowel movement was Saturday. Patient states she has been taking Remeron for 8 months and it has not helped.  Nutrition diagnosis:  Inadequate oral intake related to metastatic lung cancer and associated treatments as evidenced by 10 pound weight loss from usual weight.  Intervention: Educated patient to try a higher calorie oral nutrition supplements such as Ensure plus or boost plus.  Provided samples.  Educated patient to consume 2-3 daily between meals. Reviewed high-calorie high-protein foods. Encouraged bowel regimen. Educated patient to discuss changing Remeron dosage as appropriate to assist appetite. Questions were answered.  Teach back method used.  Contact information given.  Monitoring, evaluation, goals: Patient will tolerate increased calories and protein to minimize further weight loss.  Next visit: Patient will contact me for questions or concerns.  **Disclaimer: This note was dictated with voice recognition software. Similar sounding words can inadvertently be transcribed and this note may contain transcription errors which may not have been corrected upon publication of note.**

## 2018-06-13 LAB — GUARDANT 360

## 2018-06-17 ENCOUNTER — Inpatient Hospital Stay: Payer: Medicare Other

## 2018-06-17 ENCOUNTER — Inpatient Hospital Stay (HOSPITAL_BASED_OUTPATIENT_CLINIC_OR_DEPARTMENT_OTHER): Payer: Medicare Other | Admitting: Internal Medicine

## 2018-06-17 ENCOUNTER — Telehealth: Payer: Self-pay

## 2018-06-17 ENCOUNTER — Encounter: Payer: Self-pay | Admitting: Internal Medicine

## 2018-06-17 VITALS — BP 144/57 | HR 96 | Temp 98.6°F | Resp 20 | Ht 62.0 in | Wt 142.0 lb

## 2018-06-17 DIAGNOSIS — C7951 Secondary malignant neoplasm of bone: Secondary | ICD-10-CM | POA: Diagnosis not present

## 2018-06-17 DIAGNOSIS — R05 Cough: Secondary | ICD-10-CM

## 2018-06-17 DIAGNOSIS — C3492 Malignant neoplasm of unspecified part of left bronchus or lung: Secondary | ICD-10-CM

## 2018-06-17 DIAGNOSIS — C7801 Secondary malignant neoplasm of right lung: Secondary | ICD-10-CM

## 2018-06-17 DIAGNOSIS — C3490 Malignant neoplasm of unspecified part of unspecified bronchus or lung: Secondary | ICD-10-CM | POA: Diagnosis not present

## 2018-06-17 DIAGNOSIS — Z5111 Encounter for antineoplastic chemotherapy: Secondary | ICD-10-CM

## 2018-06-17 LAB — CMP (CANCER CENTER ONLY)
ALK PHOS: 147 U/L — AB (ref 38–126)
ALT: 40 U/L (ref 0–44)
AST: 43 U/L — ABNORMAL HIGH (ref 15–41)
Albumin: 2.9 g/dL — ABNORMAL LOW (ref 3.5–5.0)
Anion gap: 8 (ref 5–15)
BUN: 18 mg/dL (ref 8–23)
CO2: 27 mmol/L (ref 22–32)
CREATININE: 0.92 mg/dL (ref 0.44–1.00)
Calcium: 9.5 mg/dL (ref 8.9–10.3)
Chloride: 104 mmol/L (ref 98–111)
GFR, Estimated: >60 mL/min (ref >60)
Glucose, Bld: 94 mg/dL (ref 70–99)
Potassium: 4.6 mmol/L (ref 3.5–5.1)
SODIUM: 139 mmol/L (ref 135–145)
Total Bilirubin: 0.4 mg/dL (ref 0.3–1.2)
Total Protein: 7.8 g/dL (ref 6.5–8.1)

## 2018-06-17 LAB — CBC WITH DIFFERENTIAL (CANCER CENTER ONLY)
BASOS PCT: 0 %
Basophils Absolute: 0 10*3/uL (ref 0.0–0.1)
Eosinophils Absolute: 0.1 10*3/uL (ref 0.0–0.5)
Eosinophils Relative: 1 %
HCT: 33 % — ABNORMAL LOW (ref 34.8–46.6)
Hemoglobin: 10.3 g/dL — ABNORMAL LOW (ref 11.6–15.9)
Lymphocytes Relative: 8 %
Lymphs Abs: 0.5 10*3/uL — ABNORMAL LOW (ref 0.9–3.3)
MCH: 27.7 pg (ref 25.1–34.0)
MCHC: 31.2 g/dL — ABNORMAL LOW (ref 31.5–36.0)
MCV: 88.7 fL (ref 79.5–101.0)
MONOS PCT: 10 %
Monocytes Absolute: 0.5 10*3/uL (ref 0.1–0.9)
Neutro Abs: 4.3 10*3/uL (ref 1.5–6.5)
Neutrophils Relative %: 81 %
PLATELETS: 164 10*3/uL (ref 145–400)
RBC: 3.72 MIL/uL (ref 3.70–5.45)
RDW: 15.2 % — ABNORMAL HIGH (ref 11.2–14.5)
WBC Count: 5.4 10*3/uL (ref 3.9–10.3)

## 2018-06-17 MED ORDER — PREDNISONE 20 MG PO TABS: ORAL_TABLET | ORAL | 0 refills | Status: DC

## 2018-06-17 NOTE — Progress Notes (Signed)
[place] [phone]   [phone]  OFFICE PROGRESS NOTE  Rutherford Guys, MD [address] [place] [id number]  DIAGNOSIS: Metastatic non-small cell lung cancer, adenocarcinoma with positive EGFR mutation in exon 19 and development of resistant T790M mutation. This was initially diagnosed in July 2013.  PRIOR THERAPY: 1) Status post radiotherapy to the left face rib metastasis under the care of Dr. Lisbeth Renshaw.  2) stereotactic radiotherapy to the enlarging left lower lobe lung nodule under the care of Dr. Lisbeth Renshaw. 3) treatment with Tarceva 150 mg by mouth daily, therapy beginning 07/20/2012. Status post approximately 36 months of therapy. This was discontinued secondary to disease progression. 4) stereotactic radiotherapy to enlarging pulmonary nodules under the care of Dr. Lisbeth Renshaw.  CURRENT THERAPY: 1) Tagrisso 80 mg by mouth daily started 02/03/2016 status post more than 27 months of treatment. 2) Xgeva 120 mg subcutaneously every 4 weeks for bone metastasis.  INTERVAL HISTORY: Jacqueline Leonard 72 y.o. female returns to the clinic today for follow-up visit accompanied by her niece [name].  The patient continues to complain of increasing cough and shortness of breath.  She is trying Tessalon with no improvement.  Her cough got worse after the palliative radiotherapy.  She also lost few pounds recently.  She denied having any chest pain or hemoptysis.  She denied having any nausea, vomiting, diarrhea or constipation.  She continues to tolerate her treatment with Tagrisso fairly well.  She denied having any significant skin rash or diarrhea.  She denied having any headache or visual changes.  She is here today for evaluation and repeat blood work.  She had molecular studies done by [facility] and she is here for discussion of her lab results and recommendation regarding her condition.  MEDICAL HISTORY: Past Medical History:  Diagnosis Date  . Allergy     . Anxiety   . Cancer, metastatic to bone ([facility]) 06/19/12   bx=L 5th ribmetastatic Adenocarcinoma with known lung mass  . Encounter for antineoplastic chemotherapy 02/14/2016  . External hemorrhoids   . Hyperlipidemia   . Hypertension   . Lung mass   . Radiation 07/11/12-07/24/12   Palliative lung tx 30 gray in 10 fx  . S/P radiation therapy 07/22/14-07/31/14   left lung/60gy/23f  . Status post chemoradiation    Tarceva  . Vitamin D deficiency     ALLERGIES:  is allergic to penicillins.  MEDICATIONS:  Current Outpatient Medications  Medication Sig Dispense Refill  . amLODipine (NORVASC) 2.5 MG tablet TAKE 1 TABLET(2.5 MG) BY MOUTH DAILY 90 tablet 0  . benzonatate (TESSALON) 100 MG capsule Take 1-2 capsules (100-200 mg total) by mouth 3 (three) times daily as needed. 60 capsule 0  . cholecalciferol (VITAMIN D) 1000 UNITS tablet Take 1,000 Units by mouth daily.    . CYANOCOBALAMIN PO Take 1,000 mcg by mouth daily.    .Marland KitchenFeFum-FePoly-FA-B Cmp-C-Biot (INTEGRA PLUS) CAPS Take 1 capsule by mouth every morning. 30 capsule 2  . methylPREDNISolone (MEDROL) 4 MG tablet Take 6 tabs on day 1, 5 tabs on day 2, 4 tabs on day 3, 3 tabs on day 4, 2 tabs on day 5, 1 tab on day 6, and then stop (Patient not taking: Reported on 05/20/2018) 21 tablet 0  . mirtazapine (REMERON) 7.5 MG tablet Take 1 tablet (7.5 mg total) by mouth at bedtime. 30 tablet 2  . osimertinib mesylate (TAGRISSO) 80 MG tablet Take 1 tablet (80 mg total) by mouth daily.  30 tablet 2   No current facility-administered medications for this visit.     SURGICAL HISTORY:  Past Surgical History:  Procedure Laterality Date  . APPENDECTOMY    . OVARIAN CYST REMOVAL    . SOFT TISSUE BIOPSY  06/19/12   L 5th rib=metastatic adenocarcinoma    REVIEW OF SYSTEMS:  Constitutional: positive for fatigue and weight loss Eyes: negative Ears, nose, mouth, throat, and face: negative Respiratory: positive for cough and dyspnea on  exertion Cardiovascular: negative Gastrointestinal: negative Genitourinary:negative Integument/breast: negative Hematologic/lymphatic: negative Musculoskeletal:negative Neurological: negative Behavioral/Psych: negative Endocrine: negative Allergic/Immunologic: negative   PHYSICAL EXAMINATION: General appearance: alert, cooperative, fatigued and no distress Head: Normocephalic, without obvious abnormality, atraumatic Neck: no adenopathy, no JVD, supple, symmetrical, trachea midline and thyroid not enlarged, symmetric, no tenderness/mass/nodules Lymph nodes: Cervical, supraclavicular, and axillary nodes normal. Resp: clear to auscultation bilaterally Back: symmetric, no curvature. ROM normal. No CVA tenderness. Cardio: regular rate and rhythm, S1, S2 normal, no murmur, click, rub or gallop GI: soft, non-tender; bowel sounds normal; no masses,  no organomegaly Extremities: extremities normal, atraumatic, no cyanosis or edema Neurologic: Alert and oriented X 3, normal strength and tone. Normal symmetric reflexes. Normal coordination and gait  ECOG PERFORMANCE STATUS: 1 - Symptomatic but completely ambulatory  Blood pressure (!) 144/57, pulse 96, temperature 98.6 F (37 C), temperature source Oral, resp. rate 20, height _0  (1.575 m), weight 142 lb (64.4 kg), SpO2 99 %.  LABORATORY DATA: Lab Results  Component Value Date   WBC 5.4 06/17/2018   HGB 10.3 (L) 06/17/2018   HCT 33.0 (L) 06/17/2018   MCV 88.7 06/17/2018   PLT 164 06/17/2018      Chemistry      Component Value Date/Time   NA 139 05/20/2018 1123   NA 140 11/01/2017 1423   K 3.9 05/20/2018 1123   K 3.7 11/01/2017 1423   CL 104 05/20/2018 1123   CL 102 05/06/2013 1511   CO2 28 05/20/2018 1123   CO2 25 11/01/2017 1423   BUN 13 05/20/2018 1123   BUN 21.2 11/01/2017 1423   CREATININE 0.82 05/20/2018 1123   CREATININE 0.91 05/17/2018 1359   CREATININE 1.0 11/01/2017 1423      Component Value Date/Time   CALCIUM  9.5 05/20/2018 1123   CALCIUM 9.3 11/01/2017 1423   ALKPHOS 102 05/20/2018 1123   ALKPHOS 81 11/01/2017 1423   AST 37 (H) 05/20/2018 1123   AST 41 (H) 05/17/2018 1359   AST 30 11/01/2017 1423   ALT 30 05/20/2018 1123   ALT 29 05/17/2018 1359   ALT 21 11/01/2017 1423   BILITOT 0.5 05/20/2018 1123   BILITOT 0.4 05/17/2018 1359   BILITOT 0.45 11/01/2017 1423       RADIOGRAPHIC STUDIES: US Pelvis Transvanginal Non-ob (tv Only)  Result Date: 05/20/2018 Ultrasound transvaginal shows uterus retroverted normal in size with calcified myoma 27 mm and 2 separate intramural myomas 66m and 14 mm.  A cystic/solid mass in the fundal region 17 x 14 mm noted with color flow Doppler.  Right and left ovaries visualized and normal.  Cul-de-sac negative.   Mm 3d Screen Breast Bilateral  Result Date: 05/24/2018 CLINICAL DATA:  Screening. EXAM: DIGITAL SCREENING BILATERAL MAMMOGRAM WITH TOMO AND CAD COMPARISON:  Previous exam(s). ACR Breast Density Category c: The breast tissue is heterogeneously dense, which may obscure small masses. FINDINGS: There are no findings suspicious for malignancy. Images were processed with CAD. IMPRESSION: No mammographic evidence of malignancy. A result letter of  this screening mammogram will be mailed directly to the patient. RECOMMENDATION: Screening mammogram in one year. (Code:SM-B-01Y) BI-RADS CATEGORY  1: Negative. Electronically Signed   By: Claudie Revering M.D.   On: 05/24/2018 17:11    ASSESSMENT AND PLAN:  This is a very pleasant 72 years old Hispanic female with a stage IV non-small cell lung cancer, adenocarcinoma with positive EGFR mutation with deletion in exon 19 diagnosed in July 2013 status post 3 years treatment with Tarceva discontinued secondary to disease progression and development of T7 75 M EGFR resistant mutation. The patient was started on treatment with Tagrisso 80 mg by mouth daily status post 27 months.  She also received palliative radiotherapy to the  progressive pulmonary nodules. She has been tolerating her treatment with Tagrisso fairly well. Molecular studies by [facility] showed the development of new resistant mutation C797S.  Unfortunately there is no target therapy for this mutation.  Some of the expert in the field of thoracic oncology would recommend treatment with ferrous generation EGFR tyrosine kinase inhibitor like erlotinib. Other options would be to consider The patient for systemic chemotherapy. I recommended for the patient to continue her current treatment with Tagrisso for 4 more weeks before repeating CT scan of the chest, abdomen and pelvis for restaging of her disease.  If the next the scan showed evidence for disease progression, I may consider The patient for treatment with systemic chemotherapy or Erlotinib. For the persistent cough and questionable radiation-induced pneumonitis, I started the patient on a taper dose of prednisone to be tapered over the next 4 weeks.  She will start with 60 mg p.o. daily for 2 weeks followed by 40 mg p.o. daily for 1 week followed by 20 mg p.o. daily for 1 week until her upcoming visit. The patient will also continue with the over-the-counter cough medication as well as Tessalon. She was advised to call immediately if she has any concerning symptoms in the interval. All questions were answered. The patient knows to call the clinic with any problems, questions or concerns. We can certainly see the patient much sooner if necessary.  Disclaimer: This note was dictated with voice recognition software. Similar sounding words can inadvertently be transcribed and may not be corrected upon review.

## 2018-06-17 NOTE — Telephone Encounter (Signed)
Printed avs and calender of upcoming appointment. Per 7/22 los

## 2018-06-28 ENCOUNTER — Other Ambulatory Visit: Payer: Self-pay | Admitting: Family Medicine

## 2018-06-28 NOTE — Telephone Encounter (Signed)
Request for medication refill of Norvasc:2.4 mg  LOV 4/11/19with Pamella Pert Last refill 04/24/18 Pharmacy:[facility]  # [id number]

## 2018-07-15 ENCOUNTER — Inpatient Hospital Stay: Payer: Medicare Other | Attending: Internal Medicine

## 2018-07-15 DIAGNOSIS — C3490 Malignant neoplasm of unspecified part of unspecified bronchus or lung: Secondary | ICD-10-CM

## 2018-07-15 DIAGNOSIS — C3432 Malignant neoplasm of lower lobe, left bronchus or lung: Secondary | ICD-10-CM | POA: Diagnosis not present

## 2018-07-15 LAB — CMP (CANCER CENTER ONLY)
ALBUMIN: 2.9 g/dL — AB (ref 3.5–5.0)
ALK PHOS: 101 U/L (ref 38–126)
ALT: 31 U/L (ref 0–44)
ANION GAP: 7 (ref 5–15)
AST: 27 U/L (ref 15–41)
BILIRUBIN TOTAL: 0.4 mg/dL (ref 0.3–1.2)
BUN: 20 mg/dL (ref 8–23)
CALCIUM: 9 mg/dL (ref 8.9–10.3)
CO2: 29 mmol/L (ref 22–32)
Chloride: 105 mmol/L (ref 98–111)
Creatinine: 0.85 mg/dL (ref 0.44–1.00)
GFR, Est AFR Am: >60 mL/min (ref >60)
GFR, Estimated: >60 mL/min (ref >60)
Glucose, Bld: 105 mg/dL — ABNORMAL HIGH (ref 70–99)
Potassium: 4.4 mmol/L (ref 3.5–5.1)
SODIUM: 141 mmol/L (ref 135–145)
Total Protein: 6.7 g/dL (ref 6.5–8.1)

## 2018-07-15 LAB — CBC WITH DIFFERENTIAL (CANCER CENTER ONLY)
BASOS PCT: 0 %
Basophils Absolute: 0 10*3/uL (ref 0.0–0.1)
EOS PCT: 0 %
Eosinophils Absolute: 0 10*3/uL (ref 0.0–0.5)
HEMATOCRIT: 34.4 % — AB (ref 34.8–46.6)
Hemoglobin: 10.9 g/dL — ABNORMAL LOW (ref 11.6–15.9)
LYMPHS PCT: 3 %
Lymphs Abs: 0.2 10*3/uL — ABNORMAL LOW (ref 0.9–3.3)
MCH: 28.4 pg (ref 25.1–34.0)
MCHC: 31.7 g/dL (ref 31.5–36.0)
MCV: 89.6 fL (ref 79.5–101.0)
MONOS PCT: 6 %
Monocytes Absolute: 0.4 10*3/uL (ref 0.1–0.9)
NEUTROS ABS: 6.3 10*3/uL (ref 1.5–6.5)
Neutrophils Relative %: 91 %
PLATELETS: 91 10*3/uL — AB (ref 145–400)
RBC: 3.84 MIL/uL (ref 3.70–5.45)
RDW: 17.9 % — AB (ref 11.2–14.5)
WBC Count: 6.9 10*3/uL (ref 3.9–10.3)

## 2018-07-17 ENCOUNTER — Telehealth: Payer: Self-pay | Admitting: Oncology

## 2018-07-17 NOTE — Telephone Encounter (Signed)
Patient called to reschedule  °

## 2018-07-18 ENCOUNTER — Ambulatory Visit (HOSPITAL_COMMUNITY)
Admission: RE | Admit: 2018-07-18 | Discharge: 2018-07-18 | Disposition: A | Discharge status: Home / Self Care | Payer: Medicare Other | Source: Ambulatory Visit | Attending: Internal Medicine | Admitting: Internal Medicine

## 2018-07-18 DIAGNOSIS — R188 Other ascites: Secondary | ICD-10-CM | POA: Diagnosis not present

## 2018-07-18 DIAGNOSIS — N839 Noninflammatory disorder of ovary, fallopian tube and broad ligament, unspecified: Secondary | ICD-10-CM | POA: Diagnosis not present

## 2018-07-18 DIAGNOSIS — Z5111 Encounter for antineoplastic chemotherapy: Secondary | ICD-10-CM | POA: Diagnosis not present

## 2018-07-18 DIAGNOSIS — C7951 Secondary malignant neoplasm of bone: Secondary | ICD-10-CM | POA: Diagnosis not present

## 2018-07-18 DIAGNOSIS — R918 Other nonspecific abnormal finding of lung field: Secondary | ICD-10-CM | POA: Insufficient documentation

## 2018-07-18 DIAGNOSIS — C3490 Malignant neoplasm of unspecified part of unspecified bronchus or lung: Secondary | ICD-10-CM | POA: Insufficient documentation

## 2018-07-18 DIAGNOSIS — Z923 Personal history of irradiation: Secondary | ICD-10-CM | POA: Insufficient documentation

## 2018-07-18 MED ORDER — IOHEXOL 300 MG/ML  SOLN
100.0000 mL | Freq: Once | INTRAMUSCULAR | Status: AC | PRN
  Administered 2018-07-18: 100 mL via INTRAVENOUS

## 2018-07-22 ENCOUNTER — Ambulatory Visit: Payer: Medicare Other | Admitting: Oncology

## 2018-07-27 ENCOUNTER — Other Ambulatory Visit: Payer: Self-pay | Admitting: Family Medicine

## 2018-08-12 ENCOUNTER — Telehealth: Payer: Self-pay | Admitting: Oncology

## 2018-08-12 ENCOUNTER — Inpatient Hospital Stay: Payer: Medicare Other | Attending: Internal Medicine | Admitting: Oncology

## 2018-08-12 ENCOUNTER — Other Ambulatory Visit: Payer: Self-pay

## 2018-08-12 ENCOUNTER — Encounter: Payer: Self-pay | Admitting: Oncology

## 2018-08-12 ENCOUNTER — Other Ambulatory Visit: Payer: Self-pay | Admitting: Internal Medicine

## 2018-08-12 VITALS — BP 150/55 | HR 102 | Temp 98.3°F | Resp 18 | Ht 62.0 in | Wt 140.3 lb

## 2018-08-12 DIAGNOSIS — C3490 Malignant neoplasm of unspecified part of unspecified bronchus or lung: Secondary | ICD-10-CM

## 2018-08-12 DIAGNOSIS — N83202 Unspecified ovarian cyst, left side: Secondary | ICD-10-CM | POA: Diagnosis not present

## 2018-08-12 DIAGNOSIS — D398 Neoplasm of uncertain behavior of other specified female genital organs: Secondary | ICD-10-CM | POA: Insufficient documentation

## 2018-08-12 DIAGNOSIS — C3432 Malignant neoplasm of lower lobe, left bronchus or lung: Secondary | ICD-10-CM | POA: Diagnosis not present

## 2018-08-12 DIAGNOSIS — C7951 Secondary malignant neoplasm of bone: Secondary | ICD-10-CM | POA: Diagnosis not present

## 2018-08-12 DIAGNOSIS — Z5111 Encounter for antineoplastic chemotherapy: Secondary | ICD-10-CM

## 2018-08-12 DIAGNOSIS — R978 Other abnormal tumor markers: Secondary | ICD-10-CM | POA: Diagnosis not present

## 2018-08-12 DIAGNOSIS — C3492 Malignant neoplasm of unspecified part of left bronchus or lung: Secondary | ICD-10-CM

## 2018-08-12 DIAGNOSIS — R188 Other ascites: Secondary | ICD-10-CM | POA: Diagnosis not present

## 2018-08-12 MED ORDER — OSIMERTINIB MESYLATE 80 MG PO TABS: 80.0000 mg | ORAL_TABLET | Freq: Every day | ORAL | 2 refills | Status: DC

## 2018-08-12 NOTE — Progress Notes (Signed)
[facility] OFFICE PROGRESS NOTE  Rutherford Guys, MD [address] [place] [address]  DIAGNOSIS: Metastatic non-small cell lung cancer, adenocarcinoma with positive EGFR mutation in exon 19 and development of resistant T790M mutation. This was initially diagnosed in July 2013.  PRIOR THERAPY: 1) Status post radiotherapy to the left face rib metastasis under the care of Dr. Lisbeth Renshaw.  2) stereotactic radiotherapy to the enlarging left lower lobe lung nodule under the care of Dr. Lisbeth Renshaw. 3) treatment with Tarceva 150 mg by mouth daily, therapy beginning 07/20/2012. Status post approximately 36 months of therapy. This was discontinued secondary to disease progression. 4) stereotactic radiotherapy to enlarging pulmonary nodules under the care of Dr. Lisbeth Renshaw.  CURRENT THERAPY: 1) Tagrisso 80 mg by mouth daily started 02/03/2016 status post more than 28 months of treatment. 2) Xgeva 120 mg subcutaneously every 4 weeks for bone metastasis.  INTERVAL HISTORY: Jacqueline Leonard 72 y.o. female returns for routine follow-up visit accompanied by her niece.  The patient is feeling fine today and has no specific complaints except for fatigue and decreased appetite.  She denies fevers and chills.  Denies chest pain, shortness of breath, hemoptysis.  She reports that her cough improved after the prednisone that she received.  She denies nausea, vomiting, constipation, diarrhea.  Denies significant skin rash.  Denies headaches and visual changes.  The patient had a restaging CT scan and is here to discuss the results.  MEDICAL HISTORY: Past Medical History:  Diagnosis Date  . Allergy   . Anxiety   . Cancer, metastatic to bone ([place]) 06/19/12   bx=L 5th ribmetastatic Adenocarcinoma with known lung mass  . Encounter for antineoplastic chemotherapy 02/14/2016  . External hemorrhoids   . Hyperlipidemia   . Hypertension   . Lung mass   . Radiation 07/11/12-07/24/12   Palliative lung tx 30 gray in  10 fx  . S/P radiation therapy 07/22/14-07/31/14   left lung/60gy/66f  . Status post chemoradiation    Tarceva  . Vitamin D deficiency     ALLERGIES:  is allergic to penicillins.  MEDICATIONS:  Current Outpatient Medications  Medication Sig Dispense Refill  . amLODipine (NORVASC) 2.5 MG tablet TAKE 1 TABLET(2.5 MG) BY MOUTH DAILY 30 tablet 0  . benzonatate (TESSALON) 100 MG capsule Take 1-2 capsules (100-200 mg total) by mouth 3 (three) times daily as needed. 60 capsule 0  . cholecalciferol (VITAMIN D) 1000 UNITS tablet Take 1,000 Units by mouth daily.    .Marland KitchenFeFum-FePoly-FA-B Cmp-C-Biot (INTEGRA PLUS) CAPS Take 1 capsule by mouth every morning. 30 capsule 2  . mirtazapine (REMERON) 7.5 MG tablet TAKE 1 TABLET(7.5 MG) BY MOUTH AT BEDTIME 30 tablet 0  . osimertinib mesylate (TAGRISSO) 80 MG tablet Take 1 tablet (80 mg total) by mouth daily. 30 tablet 2  . ranitidine (ZANTAC) 300 MG tablet TAKE 1 TABLET(300 MG) BY MOUTH AT BEDTIME 30 tablet 0  . vitamin B-12 (CYANOCOBALAMIN) 100 MCG tablet Take 1 tablet by mouth daily.     No current facility-administered medications for this visit.     SURGICAL HISTORY:  Past Surgical History:  Procedure Laterality Date  . APPENDECTOMY    . OVARIAN CYST REMOVAL    . SOFT TISSUE BIOPSY  06/19/12   L 5th rib=metastatic adenocarcinoma    REVIEW OF SYSTEMS:   Review of Systems  Constitutional: Negative for chills, fever.  Positive for fatigue, decreased appetite, and weight loss. HENT:   Negative for mouth sores, nosebleeds, sore throat and trouble  swallowing.   Eyes: Negative for eye problems and icterus.  Respiratory: Negative for hemoptysis, shortness of breath and wheezing.  Positive for nonproductive cough which is improving. Cardiovascular: Negative for chest pain and leg swelling.  Gastrointestinal: Negative for abdominal pain, constipation, diarrhea, nausea and vomiting.  Genitourinary: Negative for bladder incontinence, difficulty urinating,  dysuria, frequency and hematuria.   Musculoskeletal: Negative for back pain, gait problem, neck pain and neck stiffness.  Skin: Negative for itching and rash.  Neurological: Negative for dizziness, extremity weakness, gait problem, headaches, light-headedness and seizures.  Hematological: Negative for adenopathy. Does not bruise/bleed easily.  Psychiatric/Behavioral: Negative for confusion, depression and sleep disturbance. The patient is not nervous/anxious.     PHYSICAL EXAMINATION:  Blood pressure (!) 150/55, pulse (!) 102, temperature 98.3 F (36.8 C), temperature source Oral, resp. rate 18, height 5' 2"  (1.575 m), weight 140 lb 4.8 oz (63.6 kg), SpO2 94 %.  ECOG PERFORMANCE STATUS: 1 - Symptomatic but completely ambulatory  Physical Exam  Constitutional: Oriented to person, place, and time and well-developed, well-nourished, and in no distress. No distress.  HENT:  Head: Normocephalic and atraumatic.  Mouth/Throat: Oropharynx is clear and moist. No oropharyngeal exudate.  Eyes: Conjunctivae are normal. Right eye exhibits no discharge. Left eye exhibits no discharge. No scleral icterus.  Neck: Normal range of motion. Neck supple.  Cardiovascular: Normal rate, regular rhythm, normal heart sounds and intact distal pulses.   Pulmonary/Chest: Effort normal and breath sounds normal. No respiratory distress. No wheezes. No rales.  Abdominal: Soft. Bowel sounds are normal. Exhibits no distension and no mass. There is no tenderness.  Musculoskeletal: Normal range of motion. Exhibits no edema.  Lymphadenopathy:    No cervical adenopathy.  Neurological: Alert and oriented to person, place, and time. Exhibits normal muscle tone. Gait normal. Coordination normal.  Skin: Skin is warm and dry. No rash noted. Not diaphoretic. No erythema. No pallor.  Psychiatric: Mood, memory and judgment normal.  Vitals reviewed.  LABORATORY DATA: Lab Results  Component Value Date   WBC 6.9 07/15/2018   HGB  10.9 (L) 07/15/2018   HCT 34.4 (L) 07/15/2018   MCV 89.6 07/15/2018   PLT 91 (L) 07/15/2018      Chemistry      Component Value Date/Time   NA 141 07/15/2018 1522   NA 140 11/01/2017 1423   K 4.4 07/15/2018 1522   K 3.7 11/01/2017 1423   CL 105 07/15/2018 1522   CL 102 05/06/2013 1511   CO2 29 07/15/2018 1522   CO2 25 11/01/2017 1423   BUN 20 07/15/2018 1522   BUN 21.2 11/01/2017 1423   CREATININE 0.85 07/15/2018 1522   CREATININE 1.0 11/01/2017 1423      Component Value Date/Time   CALCIUM 9.0 07/15/2018 1522   CALCIUM 9.3 11/01/2017 1423   ALKPHOS 101 07/15/2018 1522   ALKPHOS 81 11/01/2017 1423   AST 27 07/15/2018 1522   AST 30 11/01/2017 1423   ALT 31 07/15/2018 1522   ALT 21 11/01/2017 1423   BILITOT 0.4 07/15/2018 1522   BILITOT 0.45 11/01/2017 1423       RADIOGRAPHIC STUDIES:  Ct Chest W Contrast  Result Date: 07/19/2018 CLINICAL DATA:  Restaging metastatic non-small cell lung cancer. Radiation therapy completed 03/28/2018. Chemotherapy ongoing. EXAM: CT CHEST, ABDOMEN, AND PELVIS WITH CONTRAST TECHNIQUE: Multidetector CT imaging of the chest, abdomen and pelvis was performed following the standard protocol during bolus administration of intravenous contrast. CONTRAST:  122m OMNIPAQUE IOHEXOL 300 MG/ML  SOLN  COMPARISON:  CT 02/27/2018 and 05/17/2018. FINDINGS: CT CHEST FINDINGS Cardiovascular: Mild atherosclerosis of the aorta, great vessels and coronary arteries. No acute vascular findings are demonstrated. The heart size is normal. There is no pericardial effusion. Mediastinum/Nodes: There are no enlarged mediastinal, hilar or axillary lymph nodes. There is a small hiatal hernia. The trachea and thyroid gland appear unremarkable. Lungs/Pleura: Small left pleural effusion appears unchanged. No pleural based masses are identified. There is underlying mild emphysema and scattered subpleural reticulation. In the right upper lobe, there is increasing volume loss,  parenchymal opacity and architectural distortion consistent with radiation therapy. This obscures the right upper lobe lesion. There is mildly increased airspace opacity inferiorly in the right lower lobe. In the left lung, there is increased volume loss and opacification of the left lower lobe, likely due to a combination of radiation therapy and atelectasis. Scattered nodularity, primarily in the superior segment of the right lower lobe, is unchanged from the most recent study. No discrete enlarging pulmonary nodules are identified. Musculoskeletal/Chest wall: No chest wall mass. There is a stable expansile lesion involving the left 4th rib. Several hemangiomas are again noted within the spine. At least 2 enlarging sclerotic lesions are demonstrated within the T7 and T12 vertebral bodies (best seen on sagittal images 81 and 74 of series 6. CT ABDOMEN AND PELVIS FINDINGS Hepatobiliary: There is new ill-defined low-density centrally in the right hepatic lobe, extending into the caudate lobe. This surrounds the hepatic veins and IVC. Given proximity to the right lower lobe pulmonary findings, this is attributed to radiation therapy. There are no discrete liver lesions to suggest metastatic disease. No evidence of gallstones, gallbladder wall thickening or biliary dilatation. Pancreas: Unremarkable. No pancreatic ductal dilatation or surrounding inflammatory changes. Spleen: Normal in size without focal abnormality. Adrenals/Urinary Tract: Both adrenal glands appear normal. The kidneys appear stable with a simple cyst in the left lower pole. No evidence of renal mass or hydronephrosis. The bladder appears unremarkable for its degree of distention. Stomach/Bowel: No evidence of bowel wall thickening, distention or surrounding inflammatory change. Vascular/Lymphatic: There are no enlarged abdominal or pelvic lymph nodes. There is aortic and branch vessel atherosclerosis. Recanalized left paraumbilical vein and small  varices are again noted. Reproductive: Interval enlargement of the left ovary, measuring 4.5 x 3.2 cm on image 91/2, suspicious for metastatic disease. The right ovary appears normal. Calcified uterine fibroids and cervical nabothian cysts are present. Other: Increased ascites, primarily around the liver and in the pelvis. There is also mildly increased nodularity within the omentum without discrete measurable masses. Given the varices, this is not entirely specific, although is suspicious for peritoneal carcinomatosis. Musculoskeletal: There are enlarging sclerotic lesions within the L4 vertebral body (image 76/6) and in the left iliac bone (image 124/6). No lytic lesion or pathologic fracture. Underlying bilateral L5 pars defects, grade 1 anterolisthesis and moderate foraminal narrowing at L5-S1 again noted. IMPRESSION: 1. No disease progression identified in the chest. There are increasing parenchymal opacities with volume loss and architectural distortion in the right upper lobe and in both lower lobes, attributed to a combination of radiation therapy and atelectasis. 2. Underlying ill-defined nodularity within the aerated lungs is unchanged. As previously noted, some degree of lymphangitic spread of tumor is difficult to exclude, although this has not progressed. No adenopathy. 3. Increased ascites and nodularity in the omentum suspicious for peritoneal carcinomatosis. 4. Enlarging left adnexal lesions suspicious for metastatic disease to the left ovary. 5. Several sclerotic osseous metastases have mildly enlarged. Electronically  Signed   By: Richardean Sale M.D.   On: 07/19/2018 08:11   Ct Abdomen Pelvis W Contrast  Result Date: 07/19/2018 CLINICAL DATA:  Restaging metastatic non-small cell lung cancer. Radiation therapy completed 03/28/2018. Chemotherapy ongoing. EXAM: CT CHEST, ABDOMEN, AND PELVIS WITH CONTRAST TECHNIQUE: Multidetector CT imaging of the chest, abdomen and pelvis was performed following  the standard protocol during bolus administration of intravenous contrast. CONTRAST:  170m OMNIPAQUE IOHEXOL 300 MG/ML  SOLN COMPARISON:  CT 02/27/2018 and 05/17/2018. FINDINGS: CT CHEST FINDINGS Cardiovascular: Mild atherosclerosis of the aorta, great vessels and coronary arteries. No acute vascular findings are demonstrated. The heart size is normal. There is no pericardial effusion. Mediastinum/Nodes: There are no enlarged mediastinal, hilar or axillary lymph nodes. There is a small hiatal hernia. The trachea and thyroid gland appear unremarkable. Lungs/Pleura: Small left pleural effusion appears unchanged. No pleural based masses are identified. There is underlying mild emphysema and scattered subpleural reticulation. In the right upper lobe, there is increasing volume loss, parenchymal opacity and architectural distortion consistent with radiation therapy. This obscures the right upper lobe lesion. There is mildly increased airspace opacity inferiorly in the right lower lobe. In the left lung, there is increased volume loss and opacification of the left lower lobe, likely due to a combination of radiation therapy and atelectasis. Scattered nodularity, primarily in the superior segment of the right lower lobe, is unchanged from the most recent study. No discrete enlarging pulmonary nodules are identified. Musculoskeletal/Chest wall: No chest wall mass. There is a stable expansile lesion involving the left 4th rib. Several hemangiomas are again noted within the spine. At least 2 enlarging sclerotic lesions are demonstrated within the T7 and T12 vertebral bodies (best seen on sagittal images 81 and 74 of series 6. CT ABDOMEN AND PELVIS FINDINGS Hepatobiliary: There is new ill-defined low-density centrally in the right hepatic lobe, extending into the caudate lobe. This surrounds the hepatic veins and IVC. Given proximity to the right lower lobe pulmonary findings, this is attributed to radiation therapy. There  are no discrete liver lesions to suggest metastatic disease. No evidence of gallstones, gallbladder wall thickening or biliary dilatation. Pancreas: Unremarkable. No pancreatic ductal dilatation or surrounding inflammatory changes. Spleen: Normal in size without focal abnormality. Adrenals/Urinary Tract: Both adrenal glands appear normal. The kidneys appear stable with a simple cyst in the left lower pole. No evidence of renal mass or hydronephrosis. The bladder appears unremarkable for its degree of distention. Stomach/Bowel: No evidence of bowel wall thickening, distention or surrounding inflammatory change. Vascular/Lymphatic: There are no enlarged abdominal or pelvic lymph nodes. There is aortic and branch vessel atherosclerosis. Recanalized left paraumbilical vein and small varices are again noted. Reproductive: Interval enlargement of the left ovary, measuring 4.5 x 3.2 cm on image 91/2, suspicious for metastatic disease. The right ovary appears normal. Calcified uterine fibroids and cervical nabothian cysts are present. Other: Increased ascites, primarily around the liver and in the pelvis. There is also mildly increased nodularity within the omentum without discrete measurable masses. Given the varices, this is not entirely specific, although is suspicious for peritoneal carcinomatosis. Musculoskeletal: There are enlarging sclerotic lesions within the L4 vertebral body (image 76/6) and in the left iliac bone (image 124/6). No lytic lesion or pathologic fracture. Underlying bilateral L5 pars defects, grade 1 anterolisthesis and moderate foraminal narrowing at L5-S1 again noted. IMPRESSION: 1. No disease progression identified in the chest. There are increasing parenchymal opacities with volume loss and architectural distortion in the right upper lobe and  in both lower lobes, attributed to a combination of radiation therapy and atelectasis. 2. Underlying ill-defined nodularity within the aerated lungs is  unchanged. As previously noted, some degree of lymphangitic spread of tumor is difficult to exclude, although this has not progressed. No adenopathy. 3. Increased ascites and nodularity in the omentum suspicious for peritoneal carcinomatosis. 4. Enlarging left adnexal lesions suspicious for metastatic disease to the left ovary. 5. Several sclerotic osseous metastases have mildly enlarged. Electronically Signed   By: Richardean Sale M.D.   On: 07/19/2018 08:11     ASSESSMENT/PLAN:  Adenocarcinoma of left lung, stage 4 (HCC) This is a very pleasant 72 year old Hispanic female with a stage IV non-small cell lung cancer, adenocarcinoma with positive EGFR mutation with deletion in exon 19 diagnosed in July 2013 status post 3 years treatment with Tarceva discontinued secondary to disease progression and development of T7 40 M EGFR resistant mutation. The patient was started on treatment with Tagrisso 80 mg by mouth daily status post 28 months.  She also received palliative radiotherapy to the progressive pulmonary nodules. She has been tolerating her treatment with Tagrisso fairly well. Molecular studies by [facility] showed the development of new resistant mutation C797S.  Unfortunately there is no target therapy for this mutation.  Some of the expert in the field of thoracic oncology would recommend treatment with first generation EGFR tyrosine kinase inhibitor like erlotinib. Other options would be to consider The patient for systemic chemotherapy. She had a restaging CT scan of the chest, abdomen, pelvis and is here to discuss the results.  The patient was seen with Dr. Julien Nordmann.  Discussed with the patient that the CT of the chest showed no evidence of disease progression.  The patient has increasing ascites and nodularity in the omentum suspicious for peritoneal carcinomatosis and also an enlarged left ovary suspicious for metastatic disease.  The patient had a recent pelvic ultrasound in June 2019  which did not show any abnormality in the ovaries. Discussed with the patient that she should continue on [place] for now.  The patient will follow-up in 1 month for evaluation and repeat lab work.  We will contact Dr. Zelphia Cairo office to discuss the left ovary and possible disease in the omentum.  May consider referral to gynecology oncology pending the discussion with Dr. Phineas Real.  She was advised to call immediately if she has any concerning symptoms in the interval. All questions were answered. The patient knows to call the clinic with any problems, questions or concerns. We can certainly see the patient much sooner if necessary.   Orders Placed This Encounter  Procedures  . CBC with Differential (Cancer Center Only)    Standing Status:   Future    Standing Expiration Date:   08/13/2019  . CMP ([place] only)    Standing Status:   Future    Standing Expiration Date:   08/13/2019     Mikey Bussing, DNP, AGPCNP-BC, AOCNP 08/12/18   ADDENDUM: Hematology/Oncology Attending: I had a face-to-face encounter with the patient today.  I recommended her care plan.  This is a very pleasant 72 years old Hispanic female with metastatic non-small cell lung cancer, adenocarcinoma with positive EGFR mutation with deletion in exon 11 diagnosed in July 2016 status post 3 years treatment with Tarceva followed by development of T790M resistant mutation.  The patient was a started on treatment with Tagrisso status post 28 months. Unfortunately she developed another EGFR resistant mutation C797S that has no target therapy  at this point.  She was treated with stereotactic radiotherapy to the progressive lung lesion with good response. The patient has been tolerating her treatment with Tagrisso fairly well. She had repeat CT scan of the chest, abdomen and pelvis performed recently.  I personally and independently reviewed the scan images and discussed the results with the patient and her niece  today. Her scan showed improvement to stable disease in the chest but unfortunately there is enlargement of left ovarian mass suspicious for ovarian cancer versus metastatic disease in addition to omental carcinomatosis.  The patient has been evaluated by her gynecologist with several imaging studies including ultrasound of the pelvis.  The main findings were uterine fibromas. I am not sure if the peritoneal carcinomatosis is related to her ovarian mass or this is progression from her lung cancer.  We will ReachOut to her gynecologist for evaluation and consideration of referral to gynecologic oncology at the cancer center. The patient will continue her current treatment with Tagrisso for now. If she has any further disease progression we will consider The patient for systemic chemotherapy plus/minus immunotherapy. She will come back for follow-up visit in 1 months for reevaluation and discussion of her treatment options. The patient was advised to call immediately if she has any concerning symptoms in the interval.  Disclaimer: This note was dictated with voice recognition software. Similar sounding words can inadvertently be transcribed and may be missed upon review. [name], MD 08/12/18

## 2018-08-12 NOTE — Assessment & Plan Note (Addendum)
This is a very pleasant 72 year old Hispanic female with a stage IV non-small cell lung cancer, adenocarcinoma with positive EGFR mutation with deletion in exon 19 diagnosed in July 2013 status post 3 years treatment with Tarceva discontinued secondary to disease progression and development of T7 38 M EGFR resistant mutation. The patient was started on treatment with Tagrisso 80 mg by mouth daily status post 28 months.  She also received palliative radiotherapy to the progressive pulmonary nodules. She has been tolerating her treatment with Tagrisso fairly well. Molecular studies by [employer] showed the development of new resistant mutation C797S.  Unfortunately there is no target therapy for this mutation.  Some of the expert in the field of thoracic oncology would recommend treatment with first generation EGFR tyrosine kinase inhibitor like erlotinib. Other options would be to consider The patient for systemic chemotherapy. She had a restaging CT scan of the chest, abdomen, pelvis and is here to discuss the results.  The patient was seen with Dr. Julien Nordmann.  Discussed with the patient that the CT of the chest showed no evidence of disease progression.  The patient has increasing ascites and nodularity in the omentum suspicious for peritoneal carcinomatosis and also an enlarged left ovary suspicious for metastatic disease.  The patient had a recent pelvic ultrasound in June 2019 which did not show any abnormality in the ovaries. Discussed with the patient that she should continue on [place] for now.  The patient will follow-up in 1 month for evaluation and repeat lab work.  We will contact Dr. Zelphia Cairo office to discuss the left ovary and possible disease in the omentum.  May consider referral to gynecology oncology pending the discussion with Dr. Phineas Real.  She was advised to call immediately if she has any concerning symptoms in the interval. All questions were answered. The patient knows to  call the clinic with any problems, questions or concerns. We can certainly see the patient much sooner if necessary.

## 2018-08-12 NOTE — Telephone Encounter (Signed)
Scheduled appt per 9/16 los - gave patient AVS and calender per los. - per patient request 10/28 date.

## 2018-08-13 ENCOUNTER — Telehealth: Payer: Self-pay | Admitting: *Deleted

## 2018-08-13 ENCOUNTER — Other Ambulatory Visit: Payer: Self-pay | Admitting: Oncology

## 2018-08-13 ENCOUNTER — Telehealth: Payer: Self-pay | Admitting: Medical Oncology

## 2018-08-13 DIAGNOSIS — N838 Other noninflammatory disorders of ovary, fallopian tube and broad ligament: Secondary | ICD-10-CM

## 2018-08-13 NOTE — Progress Notes (Signed)
I spoke with Dr. Zelphia Cairo office today regarding abnormal findings on recent CT scan.  He recommends that we refer the patient to GYN oncology for further evaluation.  Referral order has been placed today.  I spoke with the patient and updated her regarding this.  I also left a voicemail for her niece.  I spoke with the GYN oncology office who is aware of the referral and will get the patient scheduled as soon as possible.  Orders Placed This Encounter  Procedures  . Ambulatory referral to Gynecologic Oncology    Referral Priority:   Routine    Referral Type:   Consultation    Referral Reason:   Specialty Services Required    Requested Specialty:   Oncology    Number of Visits Requested:   1

## 2018-08-13 NOTE — Telephone Encounter (Signed)
I think it would be more appropriate for her to see the gynecologic oncologist for their input.  I do not think further studies as far as ultrasound here is worthwhile given serial ultrasound showed enlargement of the left ovary suspicious metastatic disease.  I would cancel her ultrasound scheduled here and December and have her follow-up with the gynecologic oncologist now.  Either we can make that appointment or the medical oncologist can make that appointment however they want to handle that.

## 2018-08-13 NOTE — Telephone Encounter (Signed)
Minette Headland NP works with Dr.Mohamed (medical oncology) patient had CT abdomen/pelvis scan done on 07/18/18( in epic) Minette Headland asked if you would review and let her know your recommendations of next step. Patient has follow up U/S and OV with you on 11/18/18. She asked if patient should see you sooner vs referral to [facility] oncology based of findings "left ovary, measuring 4.5x 3.2 cm on image 91/2, suspicious for metastatic disease". And the "suspicious for peritoneal carcinomatosis"   Please advise

## 2018-08-13 NOTE — Telephone Encounter (Signed)
Requesting Prednisone.

## 2018-08-13 NOTE — Telephone Encounter (Signed)
I told pt that Dr Julien Nordmann is not going to refill prednisone .

## 2018-08-13 NOTE — Telephone Encounter (Signed)
Called, spoke with the patient and scheduled her for a new patient appt on Friday

## 2018-08-13 NOTE — Telephone Encounter (Signed)
Spoke with Erasmo Downer and they will refer patient to gyn oncology. Will have claudia call and cancel ultrasound appointment in Dec.

## 2018-08-16 ENCOUNTER — Other Ambulatory Visit (HOSPITAL_COMMUNITY)
Admission: RE | Admit: 2018-08-16 | Discharge: 2018-08-16 | Disposition: A | Discharge status: Home / Self Care | Payer: Medicare Other | Source: Ambulatory Visit | Attending: Obstetrics | Admitting: Obstetrics

## 2018-08-16 ENCOUNTER — Encounter: Payer: Self-pay | Admitting: Obstetrics

## 2018-08-16 ENCOUNTER — Inpatient Hospital Stay: Payer: Medicare Other

## 2018-08-16 ENCOUNTER — Inpatient Hospital Stay (HOSPITAL_BASED_OUTPATIENT_CLINIC_OR_DEPARTMENT_OTHER): Payer: Medicare Other | Admitting: Obstetrics

## 2018-08-16 VITALS — BP 141/56 | HR 94 | Temp 97.6°F | Resp 20 | Ht 62.0 in | Wt 140.9 lb

## 2018-08-16 DIAGNOSIS — R978 Other abnormal tumor markers: Secondary | ICD-10-CM

## 2018-08-16 DIAGNOSIS — N83202 Unspecified ovarian cyst, left side: Secondary | ICD-10-CM

## 2018-08-16 DIAGNOSIS — Z124 Encounter for screening for malignant neoplasm of cervix: Secondary | ICD-10-CM | POA: Insufficient documentation

## 2018-08-16 DIAGNOSIS — C801 Malignant (primary) neoplasm, unspecified: Secondary | ICD-10-CM

## 2018-08-16 DIAGNOSIS — D398 Neoplasm of uncertain behavior of other specified female genital organs: Secondary | ICD-10-CM

## 2018-08-16 DIAGNOSIS — R188 Other ascites: Secondary | ICD-10-CM

## 2018-08-16 DIAGNOSIS — C3490 Malignant neoplasm of unspecified part of unspecified bronchus or lung: Secondary | ICD-10-CM | POA: Diagnosis not present

## 2018-08-16 NOTE — Progress Notes (Signed)
[place] [facility] Note: New Patient FIRST VISIT   Consult was requested by Mikey Bussing, NP for a new left adnexal mass seen on restaging scans for patient's lung cancer.  Chief Complaint  Patient presents with  . Adenocarcinoma ([place])    GYN ONCOLOGIC SUMMARY 1. TBD o .  HPI: [name]  is a very nice 72 y.o.  P0  The patient has a history of stage IV non-small cell lung cancer adenocarcinoma positive EGFR mutation with a deletion in exon 19.  Diagnosed July 2013 treated with an EGFR inhibitor which was discontinued secondary to progression and development of an EGFR resistant mutation.  She is currently on Tagrisso (a 3rd generation EGFR inhibitor) and has been for over 2 years.  Has had palliative RT to pulmonary nodules.    She had a restaging CT scan of the chest abdomen and pelvis 07/19/2018: findings of interest to current referral - stable expansile lesion involving the left 4th rib.  - At least 2 enlarging sclerotic lesions are demonstrated within the T7 and T12 vertebral bodies  -  Recanalized left paraumbilical vein and small varices  - Interval enlargement (compared to June 2019) of the left ovary, measuring 4.5 x 3.2 cm on image 91/2, suspicious for metastatic disease. The right ovary appears normal. Calcified uterine fibroids and cervical nabothian cysts are present. Other: Increased ascites, primarily around the liver and in the pelvis. There is also mildly increased nodularity within the omentum without discrete measurable masses. Given the varices, this is not entirely specific, although is suspicious for peritoneal carcinomatosis.  - " Several sclerotic osseous metastases have mildly enlarged. "  See my A/P for interpretation of images since April 2019  She denies pelvic pain. Denies N/V.  Dr. Phineas Real had been following her for a fibroid also found incidentally on restaging CT Scan for her lung cancer.  There was thickened EM lining, but sampling attempt was insufficient and consideration was given for D&C, however in the face of her ongoing advanced lung cancer close observation was elected. She was due for another ultrasound, but the above CT findings referral was made to our service.  Imported EPIC Oncologic History:   No history exists.    Measurement of disease: TBD . Marland Kitchen  Radiology: Ct Chest W Contrast  Result Date: 07/19/2018 CLINICAL DATA:  Restaging metastatic non-small cell lung cancer. Radiation therapy completed 03/28/2018. Chemotherapy ongoing. EXAM: CT CHEST, ABDOMEN, AND PELVIS WITH CONTRAST TECHNIQUE: Multidetector CT imaging of the chest, abdomen and pelvis was performed following the standard protocol during bolus administration of intravenous contrast. CONTRAST:  116m OMNIPAQUE IOHEXOL 300 MG/ML  SOLN COMPARISON:  CT 02/27/2018 and 05/17/2018. FINDINGS: CT CHEST FINDINGS Cardiovascular: Mild atherosclerosis of the aorta, great vessels and coronary arteries. No acute vascular findings are demonstrated. The heart size is normal. There is no pericardial effusion. Mediastinum/Nodes: There are no enlarged mediastinal, hilar or axillary lymph nodes. There is a small hiatal hernia. The trachea and thyroid gland appear unremarkable. Lungs/Pleura: Small left pleural effusion appears unchanged. No pleural based masses are identified. There is underlying mild emphysema and scattered subpleural reticulation. In the right upper lobe, there is increasing volume loss, parenchymal opacity and architectural distortion consistent with radiation therapy. This obscures the right upper lobe lesion. There is mildly increased airspace opacity inferiorly in the right lower lobe. In the left lung, there is increased volume loss and opacification of the left lower lobe, likely due  to a combination of radiation therapy and atelectasis. Scattered nodularity, primarily in the superior segment of the right lower  lobe, is unchanged from the most recent study. No discrete enlarging pulmonary nodules are identified. Musculoskeletal/Chest wall: No chest wall mass. There is a stable expansile lesion involving the left 4th rib. Several hemangiomas are again noted within the spine. At least 2 enlarging sclerotic lesions are demonstrated within the T7 and T12 vertebral bodies (best seen on sagittal images 81 and 74 of series 6. CT ABDOMEN AND PELVIS FINDINGS Hepatobiliary: There is new ill-defined low-density centrally in the right hepatic lobe, extending into the caudate lobe. This surrounds the hepatic veins and IVC. Given proximity to the right lower lobe pulmonary findings, this is attributed to radiation therapy. There are no discrete liver lesions to suggest metastatic disease. No evidence of gallstones, gallbladder wall thickening or biliary dilatation. Pancreas: Unremarkable. No pancreatic ductal dilatation or surrounding inflammatory changes. Spleen: Normal in size without focal abnormality. Adrenals/Urinary Tract: Both adrenal glands appear normal. The kidneys appear stable with a simple cyst in the left lower pole. No evidence of renal mass or hydronephrosis. The bladder appears unremarkable for its degree of distention. Stomach/Bowel: No evidence of bowel wall thickening, distention or surrounding inflammatory change. Vascular/Lymphatic: There are no enlarged abdominal or pelvic lymph nodes. There is aortic and branch vessel atherosclerosis. Recanalized left paraumbilical vein and small varices are again noted. Reproductive: Interval enlargement of the left ovary, measuring 4.5 x 3.2 cm on image 91/2, suspicious for metastatic disease. The right ovary appears normal. Calcified uterine fibroids and cervical nabothian cysts are present. Other: Increased ascites, primarily around the liver and in the pelvis. There is also mildly increased nodularity within the omentum without discrete measurable masses. Given the varices,  this is not entirely specific, although is suspicious for peritoneal carcinomatosis. Musculoskeletal: There are enlarging sclerotic lesions within the L4 vertebral body (image 76/6) and in the left iliac bone (image 124/6). No lytic lesion or pathologic fracture. Underlying bilateral L5 pars defects, grade 1 anterolisthesis and moderate foraminal narrowing at L5-S1 again noted. IMPRESSION: 1. No disease progression identified in the chest. There are increasing parenchymal opacities with volume loss and architectural distortion in the right upper lobe and in both lower lobes, attributed to a combination of radiation therapy and atelectasis. 2. Underlying ill-defined nodularity within the aerated lungs is unchanged. As previously noted, some degree of lymphangitic spread of tumor is difficult to exclude, although this has not progressed. No adenopathy. 3. Increased ascites and nodularity in the omentum suspicious for peritoneal carcinomatosis. 4. Enlarging left adnexal lesions suspicious for metastatic disease to the left ovary. 5. Several sclerotic osseous metastases have mildly enlarged. Electronically Signed   By: Richardean Sale M.D.   On: 07/19/2018 08:11   Ct Abdomen Pelvis W Contrast  Result Date: 07/19/2018 CLINICAL DATA:  Restaging metastatic non-small cell lung cancer. Radiation therapy completed 03/28/2018. Chemotherapy ongoing. EXAM: CT CHEST, ABDOMEN, AND PELVIS WITH CONTRAST TECHNIQUE: Multidetector CT imaging of the chest, abdomen and pelvis was performed following the standard protocol during bolus administration of intravenous contrast. CONTRAST:  116m OMNIPAQUE IOHEXOL 300 MG/ML  SOLN COMPARISON:  CT 02/27/2018 and 05/17/2018. FINDINGS: CT CHEST FINDINGS Cardiovascular: Mild atherosclerosis of the aorta, great vessels and coronary arteries. No acute vascular findings are demonstrated. The heart size is normal. There is no pericardial effusion. Mediastinum/Nodes: There are no enlarged  mediastinal, hilar or axillary lymph nodes. There is a small hiatal hernia. The trachea and thyroid gland appear  unremarkable. Lungs/Pleura: Small left pleural effusion appears unchanged. No pleural based masses are identified. There is underlying mild emphysema and scattered subpleural reticulation. In the right upper lobe, there is increasing volume loss, parenchymal opacity and architectural distortion consistent with radiation therapy. This obscures the right upper lobe lesion. There is mildly increased airspace opacity inferiorly in the right lower lobe. In the left lung, there is increased volume loss and opacification of the left lower lobe, likely due to a combination of radiation therapy and atelectasis. Scattered nodularity, primarily in the superior segment of the right lower lobe, is unchanged from the most recent study. No discrete enlarging pulmonary nodules are identified. Musculoskeletal/Chest wall: No chest wall mass. There is a stable expansile lesion involving the left 4th rib. Several hemangiomas are again noted within the spine. At least 2 enlarging sclerotic lesions are demonstrated within the T7 and T12 vertebral bodies (best seen on sagittal images 81 and 74 of series 6. CT ABDOMEN AND PELVIS FINDINGS Hepatobiliary: There is new ill-defined low-density centrally in the right hepatic lobe, extending into the caudate lobe. This surrounds the hepatic veins and IVC. Given proximity to the right lower lobe pulmonary findings, this is attributed to radiation therapy. There are no discrete liver lesions to suggest metastatic disease. No evidence of gallstones, gallbladder wall thickening or biliary dilatation. Pancreas: Unremarkable. No pancreatic ductal dilatation or surrounding inflammatory changes. Spleen: Normal in size without focal abnormality. Adrenals/Urinary Tract: Both adrenal glands appear normal. The kidneys appear stable with a simple cyst in the left lower pole. No evidence of renal  mass or hydronephrosis. The bladder appears unremarkable for its degree of distention. Stomach/Bowel: No evidence of bowel wall thickening, distention or surrounding inflammatory change. Vascular/Lymphatic: There are no enlarged abdominal or pelvic lymph nodes. There is aortic and branch vessel atherosclerosis. Recanalized left paraumbilical vein and small varices are again noted. Reproductive: Interval enlargement of the left ovary, measuring 4.5 x 3.2 cm on image 91/2, suspicious for metastatic disease. The right ovary appears normal. Calcified uterine fibroids and cervical nabothian cysts are present. Other: Increased ascites, primarily around the liver and in the pelvis. There is also mildly increased nodularity within the omentum without discrete measurable masses. Given the varices, this is not entirely specific, although is suspicious for peritoneal carcinomatosis. Musculoskeletal: There are enlarging sclerotic lesions within the L4 vertebral body (image 76/6) and in the left iliac bone (image 124/6). No lytic lesion or pathologic fracture. Underlying bilateral L5 pars defects, grade 1 anterolisthesis and moderate foraminal narrowing at L5-S1 again noted. IMPRESSION: 1. No disease progression identified in the chest. There are increasing parenchymal opacities with volume loss and architectural distortion in the right upper lobe and in both lower lobes, attributed to a combination of radiation therapy and atelectasis. 2. Underlying ill-defined nodularity within the aerated lungs is unchanged. As previously noted, some degree of lymphangitic spread of tumor is difficult to exclude, although this has not progressed. No adenopathy. 3. Increased ascites and nodularity in the omentum suspicious for peritoneal carcinomatosis. 4. Enlarging left adnexal lesions suspicious for metastatic disease to the left ovary. 5. Several sclerotic osseous metastases have mildly enlarged. Electronically Signed   By: Richardean Sale  M.D.   On: 07/19/2018 08:11  .  Marland Kitchen   Outpatient Encounter Medications as of 08/16/2018  Medication Sig  . amLODipine (NORVASC) 2.5 MG tablet TAKE 1 TABLET(2.5 MG) BY MOUTH DAILY  . benzonatate (TESSALON) 100 MG capsule Take 1-2 capsules (100-200 mg total) by mouth 3 (three) times  daily as needed.  . cholecalciferol (VITAMIN D) 1000 UNITS tablet Take 1,000 Units by mouth daily.  . mirtazapine (REMERON) 7.5 MG tablet TAKE 1 TABLET(7.5 MG) BY MOUTH AT BEDTIME  . osimertinib mesylate (TAGRISSO) 80 MG tablet Take 1 tablet (80 mg total) by mouth daily.  . vitamin B-12 (CYANOCOBALAMIN) 100 MCG tablet Take 1 tablet by mouth daily.  Marland Kitchen FeFum-FePoly-FA-B Cmp-C-Biot (INTEGRA PLUS) CAPS Take 1 capsule by mouth every morning. (Patient not taking: Reported on 08/16/2018)  . [DISCONTINUED] ranitidine (ZANTAC) 300 MG tablet TAKE 1 TABLET(300 MG) BY MOUTH AT BEDTIME (Patient not taking: Reported on 08/16/2018)   No facility-administered encounter medications on file as of 08/16/2018.    Allergies  Allergen Reactions  . Penicillins Palpitations and Other (See Comments)    Has patient had a PCN reaction causing immediate rash, facial/tongue/throat swelling, SOB or lightheadedness with hypotension: No Has patient had a PCN reaction causing severe rash involving mucus membranes or skin necrosis: No Has patient had a PCN reaction that required hospitalization No Has patient had a PCN reaction occurring within the last 10 years: Yes If all of the above answers are "NO", then may proceed with Cephalosporin use.    Past Medical History:  Diagnosis Date  . Allergy   . Anxiety   . Cancer, metastatic to bone ([place]) 06/19/12   bx=L 5th ribmetastatic Adenocarcinoma with known lung mass  . Encounter for antineoplastic chemotherapy 02/14/2016  . External hemorrhoids   . Hyperlipidemia   . Hypertension   . Lung mass   . Radiation 07/11/12-07/24/12   Palliative lung tx 30 gray in 10 fx  . S/P radiation therapy  07/22/14-07/31/14   left lung/60gy/70f  . Status post chemoradiation    Tarceva  . Vitamin D deficiency    Past Surgical History:  Procedure Laterality Date  . APPENDECTOMY    . OVARIAN CYST REMOVAL    . SOFT TISSUE BIOPSY  06/19/12   L 5th rib=metastatic adenocarcinoma        Past Gynecological History:   GYNECOLOGIC HISTORY:  . No LMP recorded. Patient is postmenopausal. unknown . Menarche: 72years old . P 0 . Contraceptive none . HRT none  . Last Pap unsure Family Hx:  Family History  Problem Relation Age of Onset  . Breast cancer Sister 549 . Breast cancer Sister 5106 . Lung cancer Brother    Social Hx:  .Marland KitchenTobacco use: none . Alcohol use: none . Illicit Drug use: none . Illicit IV Drug use: none    Review of Systems: Review of Systems  Constitutional: Positive for appetite change and fatigue.  Respiratory: Positive for cough.   Gastrointestinal: Positive for constipation.  Musculoskeletal: Positive for arthralgias and myalgias.  early satiety  Vitals: Blood pressure (!) 141/56, pulse 94, temperature 97.6 F (36.4 C), temperature source Oral, resp. rate 20, height 5' 2"  (1.575 m), weight 140 lb 14.4 oz (63.9 kg), SpO2 97 %. Vitals:   08/16/18 1424  Weight: 140 lb 14.4 oz (63.9 kg)  Height: 5' 2"  (1.575 m)    Vitals:   08/16/18 1424  BP: (!) 141/56  Pulse: 94  Resp: 20  Temp: 97.6 F (36.4 C)  SpO2: 97%   Body mass index is 25.77 kg/m.   Physical Exam: General :  Mild respiratory distress. Thin, 72y.o., female in no apparent distress HEENT:  Normocephalic/atraumatic, symmetric, EOMI, eyelids normal Neck:   Supple, no masses.  Lymphatics:  No cervical/ submandibular/ supraclavicular/ infraclavicular/ inguinal adenopathy  Respiratory:  Respirations unlabored, no use of accessory muscles CV:   Deferred Breast:  Deferred Musculoskeletal: No CVA tenderness, normal muscle strength. Abdomen:  Soft, non-tender and nondistended. No evidence of hernia. No  masses. Extremities:  No lymphedema, no erythema, non-tender. Skin:   Normal inspection Neuro/Psych:  No focal motor deficit, no abnormal mental status. Normal gait. Normal affect. Alert and oriented to person, place, and time  Genito Urinary: Vulva: Normal external female genitalia.  Bladder/urethra: Urethral meatus normal in size and location. No lesions or   masses, well supported bladder Speculum exam: Vagina: No lesion, no discharge, no bleeding. Cervix: Normal appearing, no lesions. Bimanual exam:  Uterus: Mobile.  Adnexa: Fullness left adnexa.  Rectovaginal:  Good tone, no cul de sac nodularity, no parametrial involvement or nodularity.   Assessment  Left adnexal mass in patient with Stage IV lung CA + family h/o breast cancer  Plan  1. Data reviewed ? I independently reviewed the images and the radiology reports and discussed my interpretation in the presence of the patient today ? It is hard to determine the complexity of the mass. ? The adnexa is seen 02/2018 measuring 24x44m --> 04/2018 33x168m--> 07/2018 39x2735mThe consistency has remained stable. ? I recommend obtained an ultrasound to further characterize the mass ? I reviewed her referring doctor's office notes and I have summarized in the HPI ? History was obtained from the patient and the chart ? We reviewed she has not yet had tumor markers and I ordered those today 2. Metastatic disease to the adnexa is to be ruled out; I presume her lung cancer providers would change her targeted therapy regimen 3. I am not sure if the progressive sclerotic lesions will play into this decision also 4. I worry about her tolerance of a major surgery (even laparoscopy) due to her pulmonary status. ? If she goes to surgery she is going to need pulmonary clearance 5. I would like to get a better look at the characteristics of this lesion (example cystic or solid, vascular, etc) before going down the path of surgery 6. We will also  obtain tumor markers. 7. I do not see anything in the omentum on my review. o The patient does have 2 family members with breast cancer. o We'll reach out to genetic to see if testing would be an option for the patient. 8. RTC after ultrasound and tumor markers for further management planning.  Face to face time with patient was 45 minutes. Over 50% of this time was spent on counseling and coordination of care.  ShaIsabel CapriceD  08/16/2018, 3:22 PM    Cc: KriMikey BussingP (Referring Med Onc provider) [place] GuysD (PCP)

## 2018-08-16 NOTE — Patient Instructions (Signed)
1. We did a Pap smear. We will let you know the results. 2. We are doing a blood test and will review that when you return. 3. An ultrasound will be scheduled. 4. You will come back to see me to review the above in the next 2 weeks +/-

## 2018-08-17 LAB — CA 125: CANCER ANTIGEN (CA) 125: 439 U/mL — AB (ref 0.0–38.1)

## 2018-08-19 ENCOUNTER — Telehealth: Payer: Self-pay | Admitting: Oncology

## 2018-08-19 LAB — CEA (IN HOUSE-CHCC): CEA (CHCC-In House): 25.88 ng/mL — ABNORMAL HIGH (ref 0.00–5.00)

## 2018-08-19 NOTE — Telephone Encounter (Signed)
Called Jacqueline Leonard back and set up appointment at 3:00 on 08/20/18 for genetic counceling.  She verbalized understanding and agreement.

## 2018-08-19 NOTE — Telephone Encounter (Signed)
Called Jacqueline Leonard and discussed genetic counseling.  She said she would like to have an appointment scheduled.  Advised her that we will call her back with an appointment.

## 2018-08-20 ENCOUNTER — Telehealth: Payer: Self-pay

## 2018-08-20 ENCOUNTER — Inpatient Hospital Stay: Payer: Medicare Other

## 2018-08-20 ENCOUNTER — Encounter: Payer: Self-pay | Admitting: Licensed Clinical Social Worker

## 2018-08-20 ENCOUNTER — Inpatient Hospital Stay: Payer: Medicare Other | Admitting: Licensed Clinical Social Worker

## 2018-08-20 DIAGNOSIS — C3492 Malignant neoplasm of unspecified part of left bronchus or lung: Secondary | ICD-10-CM | POA: Diagnosis not present

## 2018-08-20 DIAGNOSIS — Z801 Family history of malignant neoplasm of trachea, bronchus and lung: Secondary | ICD-10-CM

## 2018-08-20 DIAGNOSIS — Z803 Family history of malignant neoplasm of breast: Secondary | ICD-10-CM | POA: Insufficient documentation

## 2018-08-20 NOTE — Telephone Encounter (Signed)
Oral Oncology Patient Advocate Encounter  [facility] have updated their system and are now having major issues. The patients and [place] here at the office are sitting on the phone between 1-3 hours to talk to someone.   When I got them on the phone today I requested refills for all of our [facility] patients and they will expedite the refill process and overnight Ms. Shenker's Tagrisso.  I spoke to the patient and she verbalized understanding and appreciation. She also stated that she spoke to [facility] and she should be getting her medicine today.  [place] [facility] [place] Phone [phone] Fax [phone]

## 2018-08-20 NOTE — Progress Notes (Addendum)
REFERRING PROVIDER: Isabel Caprice, MD [address] [place], [place] [address]  PRIMARY PROVIDER:  Rutherford Guys, MD  PRIMARY REASON FOR VISIT:  1. Adenocarcinoma of left lung, stage 4 ([facility])   2. Family history of breast cancer   3. Family history of lung cancer      HISTORY OF PRESENT ILLNESS:   Ms. Wecker, a 72 y.o. female, was seen for a [facility] cancer genetics consultation at the request of Dr. Gerarda Fraction due to a personal and family history of cancer.  Ms. Friese presents to clinic today to discuss the possibility of a hereditary predisposition to cancer, genetic testing, and to further clarify her future cancer risks, as well as potential cancer risks for family members.   In 2013, at the age of 9, Ms. Hartzell was diagnosed with left lung cancer, metastatic, non-small cell. This has been treated with radiation. She was also recently found to have an ovarian mass.    HORMONAL RISK FACTORS:  Menarche was at age 71.  OCP use for approximately 0 years.  Ovaries intact: yes.  Hysterectomy: no.  Menopausal status: postmenopausal.  HRT use: 0 years. Colonoscopy: yes; normal. Mammogram within the last year: yes. Number of breast biopsies: 0.  Past Medical History:  Diagnosis Date  . Allergy   . Anxiety   . External hemorrhoids   . Family history of breast cancer   . Family history of lung cancer   . Hyperlipidemia   . Hypertension   . Lung cancer metastatic to bone ([facility]) 06/19/2012   bx=L 5th ribmetastatic Adenocarcinoma with known lung mass  . Radiation 07/11/12-07/24/12   Palliative lung tx 30 gray in 10 fx  . S/P radiation therapy 07/22/14-07/31/14   left lung/60gy/14f  . Status post chemoradiation    Tarceva  . Vitamin D deficiency     Past Surgical History:  Procedure Laterality Date  . APPENDECTOMY    . OVARIAN CYST REMOVAL     twice - 1970's and 1980's  . SOFT TISSUE BIOPSY  06/19/12   L 5th rib=metastatic adenocarcinoma    Social History    Socioeconomic History  . Marital status: Divorced    Spouse name: Not on file  . Number of children: Not on file  . Years of education: Not on file  . Highest education level: Not on file  Occupational History  . Not on file  Social Needs  . Financial resource strain: Not on file  . Food insecurity:    Worry: Not on file    Inability: Not on file  . Transportation needs:    Medical: Not on file    Non-medical: Not on file  Tobacco Use  . Smoking status: Never Smoker  . Smokeless tobacco: Never Used  Substance and Sexual Activity  . Alcohol use: No  . Drug use: No  . Sexual activity: Never    Comment: 1st intercourse 72 yo-Fewer than 5 partners  Lifestyle  . Physical activity:    Days per week: Not on file    Minutes per session: Not on file  . Stress: Not on file  Relationships  . Social connections:    Talks on phone: Not on file    Gets together: Not on file    Attends religious service: Not on file    Active member of club or organization: Not on file    Attends meetings of clubs or organizations: Not on file    Relationship status: Not on file  Other Topics Concern  . Not on file  Social History Narrative  . Not on file     FAMILY HISTORY:  We obtained a detailed, 4-generation family history.  Significant diagnoses are listed below: Family History  Problem Relation Age of Onset  . Lung cancer Brother   . Breast cancer Sister        dx early 30s, d 24  . Breast cancer Sister 45       d 59s    Ms. Winkleman does not have children. She has four sisters and one brother. Two of her sisters had breast cancer. One was diagnosed in her early 63's and died at 62, and the other was diagnosed at 15 and died in her 49's. Her other two sisters are alive with no history of cancer, ages 51 and 20. Her brother had lung cancer and died at 65. All of her siblings have children, no history of cancer for them.  Ms. Mutschler's father died at 74 from smoking, but he did not  have cancer. He had one sister who died in her late 56's. Ms. Brys believes one of her paternal cousins may have had cancer but she doesn't know what type. Ms. Batch paternal grandmother died young, but she does not have any other information about her. She does not have information about her paternal grandfather.  Ms. Lefeber's mother died at 32. She had a sister and two brothers, all who died over the age of 31, no cancer history. No cancers in Ms. Bigford's maternal cousins that she is aware of. Ms. Ashenfelter does not have information about her maternal grandparents.  Ms. Aull also notes that she and her siblings grew up in [place] near a Civil engineer, contracting, and that many of her neighbors ended up developing cancer.   Ms. Uplinger is unaware of previous family history of genetic testing for hereditary cancer risks. Patient's maternal and paternal ancestors are from [place]. There is no reported Ashkenazi Jewish ancestry. There is no known consanguinity.  GENETIC COUNSELING ASSESSMENT: Alyna Stensland is a 72 y.o. female with a personal and family history which is somewhat suggestive of a Hereditary Cancer Predisposition Syndrome. We, therefore, discussed and recommended the following at today's visit.   DISCUSSION: We discussed that about 5-10% of breast cancer cases are hereditary. We reviewed the characteristics, features and inheritance patterns of hereditary cancer syndromes. We also discussed genetic testing, including the appropriate family members to test, the process of testing, insurance coverage and turn-around-time for results. We discussed the implications of a negative, positive and/or variant of uncertain significant result. We recommended (patient) pursue genetic testing for the [facility] Common Hereditary Cancers Panel + EGFR.  The Common Hereditary Cancers Panel offered by [facility] includes sequencing and/or deletion duplication testing of the following 47 genes: APC, ATM,  AXIN2, BARD1, BMPR1A, BRCA1, BRCA2, BRIP1, CDH1, CDKN2A (p14ARF), CDKN2A (p16INK4a), CKD4, CHEK2, CTNNA1, DICER1, EPCAM (Deletion/duplication testing only), GREM1 (promoter region deletion/duplication testing only), KIT, MEN1, MLH1, MSH2, MSH3, MSH6, MUTYH, NBN, NF1, NHTL1, PALB2, PDGFRA, PMS2, POLD1, POLE, PTEN, RAD50, RAD51C, RAD51D, SDHB, SDHC, SDHD, SMAD4, SMARCA4. STK11, TP53, TSC1, TSC2, and VHL.  The following genes were evaluated for sequence changes only: SDHA and HOXB13 c.251G>A variant only.  We discussed that if she is found to have a mutation in one of these genes, it may impact surgical decisions, and alter future medical management recommendations such as increased cancer screenings and consideration of risk reducing surgeries.  A positive result could also have implications  for the patient's family members.  A Negative result would mean we were unable to identify a hereditary component to her cancer, but does not rule out the possibility of a hereditary basis for her cancer.  There could be mutations that are undetectable by current technology, or in genes not yet tested or identified to increase cancer risk.    We discussed the potential to find a Variant of Uncertain Significance or VUS.  These are variants that have not yet been identified as pathogenic or benign, and it is unknown if this variant is associated with increased cancer risk or if this is a normal finding.  Most VUS's are reclassified to benign or likely benign.   It should not be used to make medical management decisions. With time, we suspect the lab will determine the significance of any VUS's identified if any.   Based on Ms. Alkhatib's family history of cancer (her sister's breast cancer in her early 72's), she meets medical criteria for genetic testing. Despite that she meets criteria, she may still have an out of pocket cost. The lab will notify her if she has an out of pocket cost.  PLAN: After considering the risks,  benefits, and limitations, Ms. Rossano  provided informed consent to pursue genetic testing and the blood sample was sent to [facility] for analysis of the Common Hereditary Cancers Panel + EGFR. Results should be available within approximately 2-3 weeks' time, at which point they will be disclosed by telephone to Ms. Pepper, as will any additional recommendations warranted by these results. Ms. Rinkenberger will receive a summary of her genetic counseling visit and a copy of her results once available. This information will also be available in Epic. We encouraged Ms. Bassett to remain in contact with cancer genetics annually so that we can continuously update the family history and inform her of any changes in cancer genetics and testing that may be of benefit for her family. Ms. Waszak questions were answered to her satisfaction today. Our contact information was provided should additional questions or concerns arise.  Lastly, we encouraged Ms. Mallon to remain in contact with cancer genetics annually so that we can continuously update the family history and inform her of any changes in cancer genetics and testing that may be of benefit for this family.   Ms.  Perella questions were answered to her satisfaction today. Our contact information was provided should additional questions or concerns arise. Thank you for the referral and allowing [place] to share in the care of your patient.   Epimenio Foot, MS Genetic Counselor [email or web address] Phone: [phone]  The patient was seen for a total of 30 minutes in face-to-face genetic counseling.

## 2018-08-21 ENCOUNTER — Other Ambulatory Visit: Payer: Self-pay

## 2018-08-21 ENCOUNTER — Ambulatory Visit (INDEPENDENT_AMBULATORY_CARE_PROVIDER_SITE_OTHER): Payer: Medicare Other | Admitting: Family Medicine

## 2018-08-21 ENCOUNTER — Encounter: Payer: Self-pay | Admitting: Family Medicine

## 2018-08-21 ENCOUNTER — Telehealth: Payer: Self-pay

## 2018-08-21 VITALS — BP 132/76 | HR 98 | Temp 98.5°F | Ht 62.0 in | Wt 140.2 lb

## 2018-08-21 DIAGNOSIS — R059 Cough, unspecified: Secondary | ICD-10-CM

## 2018-08-21 DIAGNOSIS — Z23 Encounter for immunization: Secondary | ICD-10-CM

## 2018-08-21 DIAGNOSIS — C3492 Malignant neoplasm of unspecified part of left bronchus or lung: Secondary | ICD-10-CM

## 2018-08-21 DIAGNOSIS — F4321 Adjustment disorder with depressed mood: Secondary | ICD-10-CM

## 2018-08-21 DIAGNOSIS — R05 Cough: Secondary | ICD-10-CM

## 2018-08-21 LAB — CYTOLOGY - PAP: DIAGNOSIS: NEGATIVE

## 2018-08-21 MED ORDER — MIRTAZAPINE 30 MG PO TABS: 30.0000 mg | ORAL_TABLET | Freq: Every day | ORAL | 2 refills | Status: DC

## 2018-08-21 MED ORDER — GUAIFENESIN-CODEINE 100-10 MG/5ML PO SYRP: 5.0000 mL | ORAL_SOLUTION | Freq: Three times a day (TID) | ORAL | 2 refills | Status: DC | PRN

## 2018-08-21 NOTE — Patient Instructions (Signed)
° ° ° °  If you have lab work done today you will be contacted with your lab results within the next 2 weeks.  If you have not heard from us then please contact us. The fastest way to get your results is to register for My Chart. ° ° °IF you received an x-ray today, you will receive an invoice from [facility]. Please contact [facility] at [phone] with questions or concerns regarding your invoice.  ° °IF you received labwork today, you will receive an invoice from [facility]. Please contact [facility] at [phone] with questions or concerns regarding your invoice.  ° °Our billing staff will not be able to assist you with questions regarding bills from these companies. ° °You will be contacted with the lab results as soon as they are available. The fastest way to get your results is to activate your My Chart account. Instructions are located on the last page of this paperwork. If you have not heard from us regarding the results in 2 weeks, please contact this office. °  ° ° ° °

## 2018-08-21 NOTE — Progress Notes (Signed)
9/25/20194:54 PM  Jacqueline Leonard 1946-09-26, 72 y.o. female [id number]  Chief Complaint  Patient presents with  . Cough    has been having cough and cold symptoms for the past 6 mos    HPI:   Patient is a 72 y.o. female with past medical history significant for metastatic lung cancer who presents today for cough  Had been treated with pred taper for cough, while she was taking it cough resolved and now back Tessalon pearls do not help She asked onc for more pred and he stated it was not needed Cough is chronic, dry, no SOB, no fever or chills Last onc visit in 08/12/2018 Discussed results of ct - lung no progression of disease, however increased ascites, enlarged left ovary and nodular peritoneoum   Has seen gyn-onc Tomorrow has more studies  Appetite nor mood improved with remeron,taking 15mg   Weight stable Lives alone, able to perform her IADLs However leaving the house less   Fall Risk  08/21/2018 05/14/2018 04/30/2018 03/07/2018 03/07/2018  Falls in the past year? No No No No No     Depression screen [facility] 2/9 08/21/2018 05/14/2018 04/30/2018  Decreased Interest 0 0 0  Down, Depressed, Hopeless 0 0 0  PHQ - 2 Score 0 0 0  Some recent data might be hidden    Allergies  Allergen Reactions  . Penicillins Palpitations and Other (See Comments)    Has patient had a PCN reaction causing immediate rash, facial/tongue/throat swelling, SOB or lightheadedness with hypotension: No Has patient had a PCN reaction causing severe rash involving mucus membranes or skin necrosis: No Has patient had a PCN reaction that required hospitalization No Has patient had a PCN reaction occurring within the last 10 years: Yes If all of the above answers are "NO", then may proceed with Cephalosporin use.    Prior to Admission medications   Medication Sig Start Date End Date Taking? Authorizing Provider  amLODipine (NORVASC) 2.5 MG tablet TAKE 1 TABLET(2.5 MG) BY MOUTH DAILY 07/30/18  Yes Rutherford Guys, MD  cholecalciferol (VITAMIN D) 1000 UNITS tablet Take 1,000 Units by mouth daily.   Yes [provider]  FeFum-FePoly-FA-B Cmp-C-Biot (INTEGRA PLUS) CAPS Take 1 capsule by mouth every morning. 03/12/17  Yes Curt Bears, MD  mirtazapine (REMERON) 7.5 MG tablet TAKE 1 TABLET(7.5 MG) BY MOUTH AT BEDTIME 07/30/18  Yes Rutherford Guys, MD  osimertinib mesylate (TAGRISSO) 80 MG tablet Take 1 tablet (80 mg total) by mouth daily. 08/12/18  Yes Curcio, Roselie Awkward, NP  ranitidine (ZANTAC) 15 MG/ML syrup Take by mouth 2 (two) times daily.   Yes [provider]  vitamin B-12 (CYANOCOBALAMIN) 100 MCG tablet Take 1 tablet by mouth daily.   Yes [provider]  benzonatate (TESSALON) 100 MG capsule Take 1-2 capsules (100-200 mg total) by mouth 3 (three) times daily as needed. Patient not taking: Reported on 08/21/2018 04/30/18   Jaynee Eagles, PA-C    Past Medical History:  Diagnosis Date  . Allergy   . Anxiety   . External hemorrhoids   . Family history of breast cancer   . Family history of lung cancer   . Hyperlipidemia   . Hypertension   . Lung cancer metastatic to bone ([place] [facility]) 06/19/2012   bx=L 5th ribmetastatic Adenocarcinoma with known lung mass  . Radiation 07/11/12-07/24/12   Palliative lung tx 30 gray in 10 fx  . S/P radiation therapy 07/22/14-07/31/14   left lung/60gy/34fx  . Status post chemoradiation  Tarceva  . Vitamin D deficiency     Past Surgical History:  Procedure Laterality Date  . APPENDECTOMY    . OVARIAN CYST REMOVAL     twice - 1970's and 1980's  . SOFT TISSUE BIOPSY  06/19/12   L 5th rib=metastatic adenocarcinoma    Social History   Tobacco Use  . Smoking status: Never Smoker  . Smokeless tobacco: Never Used  Substance Use Topics  . Alcohol use: No    Family History  Problem Relation Age of Onset  . Lung cancer Brother   . Breast cancer Sister        dx early 95s, d 73  . Breast cancer Sister 48       d 32s    ROS Per  hpi  OBJECTIVE:  Blood pressure 132/76, pulse 98, temperature 98.5 F (36.9 C), temperature source Oral, height 5\' 2"  (1.575 m), weight 140 lb 3.2 oz (63.6 kg), SpO2 94 %. Body mass index is 25.64 kg/m.   Wt Readings from Last 3 Encounters:  08/21/18 140 lb 3.2 oz (63.6 kg)  08/16/18 140 lb 14.4 oz (63.9 kg)  08/12/18 140 lb 4.8 oz (63.6 kg)    Physical Exam  Constitutional: She is oriented to person, place, and time. She appears well-developed and well-nourished.  HENT:  Head: Normocephalic and atraumatic.  Mouth/Throat: Oropharynx is clear and moist. No oropharyngeal exudate.  Eyes: Pupils are equal, round, and reactive to light. Conjunctivae and EOM are normal. No scleral icterus.  Neck: Neck supple.  Cardiovascular: Normal rate, regular rhythm and normal heart sounds. Exam reveals no gallop and no friction rub.  No murmur heard. Pulmonary/Chest: Effort normal and breath sounds normal. She has no wheezes. She has no rales.  Musculoskeletal: She exhibits no edema.  Neurological: She is alert and oriented to person, place, and time.  Skin: Skin is warm and dry.  Psychiatric: She has a normal mood and affect.  Nursing note and vitals reviewed.   ASSESSMENT and PLAN  1. Cough 2. Adenocarcinoma of left lung, stage 4 ([facility]) Discussed supportive measures. Treatment options r/se/b. Will do trial of cough medication with codeine.   3. Adjustment disorder with depressed mood Not controlled. Patient with limited support group, increasing remeron  4. Need for prophylactic vaccination and inoculation against influenza - Flu vaccine HIGH DOSE PF (Fluzone High dose)  Other orders. - guaiFENesin-codeine (ROBITUSSIN AC) 100-10 MG/5ML syrup; Take 5 mLs by mouth 3 (three) times daily as needed for cough. - mirtazapine (REMERON) 30 MG tablet; Take 1 tablet (30 mg total) by mouth at bedtime..    Return in about 3 months (around 11/20/2018).    Rutherford Guys, MD Primary Care at  [address] [place], [place] [address] Ph.  [phone] Fax [phone]

## 2018-08-21 NOTE — Telephone Encounter (Signed)
Outgoing call to pt per Jacqueline John NP -results of PAP as "normal pap"  - pt voiced understanding. No other needs per pt at this time.

## 2018-08-22 ENCOUNTER — Telehealth: Payer: Self-pay | Admitting: Medical Oncology

## 2018-08-22 ENCOUNTER — Ambulatory Visit (HOSPITAL_COMMUNITY)
Admission: RE | Admit: 2018-08-22 | Discharge: 2018-08-22 | Disposition: A | Discharge status: Home / Self Care | Payer: Medicare Other | Source: Ambulatory Visit | Attending: Obstetrics | Admitting: Obstetrics

## 2018-08-22 DIAGNOSIS — N838 Other noninflammatory disorders of ovary, fallopian tube and broad ligament: Secondary | ICD-10-CM | POA: Insufficient documentation

## 2018-08-22 DIAGNOSIS — D259 Leiomyoma of uterus, unspecified: Secondary | ICD-10-CM | POA: Diagnosis not present

## 2018-08-22 DIAGNOSIS — N83202 Unspecified ovarian cyst, left side: Secondary | ICD-10-CM

## 2018-08-22 NOTE — Telephone Encounter (Signed)
Per Daleen Snook the  pt was told that our oral chemo team is working on getting the refill for her and all the other pts on [place] and me program.

## 2018-08-23 ENCOUNTER — Telehealth: Payer: Self-pay | Admitting: *Deleted

## 2018-08-23 NOTE — Telephone Encounter (Signed)
Niece of pt Hilton Cork called pt out of Tagrisso.  [employer] has agreed to send pt a free 15 day supply of Tagrisso as [place] [facility] program is having a system wide phone problem which is not allowing them to communicate with pt's and send out medication. Access 360 form filled out and faxed to [phone]. Requested this medication be expedited. Case# included on ppwk. Rep advised pt will receive medication by Tuesday 10/1

## 2018-08-27 NOTE — Telephone Encounter (Signed)
Oral Oncology Patient Advocate Encounter  I received a call from [employer] stating that her regular refill will be delivered to her home today 08/27/18 by [employer], tracking number is [id number].  I called the patient and let her know and she verbalized understanding and appreciation  [facility] [place] Phone [phone] Fax [phone]

## 2018-08-29 NOTE — Progress Notes (Addendum)
[place] at [facility]   Progress Note: Establised Patient Follow-Up Visit   Consult was requested by Jacqueline Bussing, NP for a left adnexal mass seen on restaging scans for patient's lung cancer.  Chief Complaint  Patient presents with  . Ovarian Cyst    GYN ONCOLOGIC SUMMARY 1. TBD o .  HPI: Jacqueline Leonard  is a very nice 72 y.o.  P0  The patient has a history of stage IV non-small cell lung cancer adenocarcinoma positive EGFR mutation with a deletion in exon 19.  Diagnosed July 2013 treated with an EGFR inhibitor which was discontinued secondary to progression and development of an EGFR resistant mutation.  She is currently on Tagrisso (a 3rd generation EGFR inhibitor) and has been for over 2 years.  Has had palliative RT to pulmonary nodules.    She had a restaging CT scan of the chest abdomen and pelvis 07/19/2018: findings of interest to current referral - stable expansile lesion involving the left 4th rib.  - At least 2 enlarging sclerotic lesions are demonstrated within the T7 and T12 vertebral bodies  -  Recanalized left paraumbilical vein and small varices  - Interval enlargement (compared to June 2019) of the left ovary, measuring 4.5 x 3.2 cm on image 91/2, suspicious for metastatic disease. The right ovary appears normal. Calcified uterine fibroids and cervical nabothian cysts are present. Other: Increased ascites, primarily around the liver and in the pelvis. There is also mildly increased nodularity within the omentum without discrete measurable masses. Given the varices, this is not entirely specific, although is suspicious for peritoneal carcinomatosis.  - " Several sclerotic osseous metastases have mildly enlarged. "  See my A/P for interpretation of images since April 2019  She denies pelvic pain. Denies N/V.  Dr. Phineas Real had been following her for a fibroid also found incidentally on restaging CT Scan for her lung cancer.  There was thickened EM lining, but sampling attempt was insufficient and consideration was given for D&C, however in the face of her ongoing advanced lung cancer close observation was elected. She was due for another ultrasound, but the above CT findings referral was made to our service.  Interval History Since her last visit I had her seen by genetics. She had genetic testing and was found to have NO pathologic mutations. She also had an ultrasound confirming the ovarian mass is worrisome in appearance.  We are reviewing a care plan today.  I have reached out to her Oncologist, Dr. Julien Nordmann, who feels there are no contraindications in proceeding to surgery (from a life expectancy standpoint).  Imported EPIC Oncologic History:    Metastatic adenocarcinoma, pathology 06/19/12   06/27/2012 Initial Diagnosis    Metastatic adenocarcinoma, pathology 06/19/12     Measurement of disease: TBD . Marland Kitchen  Radiology: US Pelvic Complete With Transvaginal  Result Date: 08/22/2018 CLINICAL DATA:  Abnormal CT exam with LEFT ovarian mass, history of non-small cell lung cancer EXAM: TRANSABDOMINAL AND TRANSVAGINAL ULTRASOUND OF PELVIS TECHNIQUE: Both transabdominal and transvaginal ultrasound examinations of the pelvis were performed. Transabdominal technique was performed for global imaging of the pelvis including uterus, ovaries, adnexal regions, and pelvic cul-de-sac. It was necessary to proceed with endovaginal exam following the transabdominal exam to visualize the uterus and ovaries. COMPARISON:  12/11/2017; correlation CT abdomen and pelvis 07/18/2018 FINDINGS: Uterus Measurements: 6.2 x 3.7 x 5.1 cm. Large calcified mass again identified within the central uterus consistent with a calcified leiomyoma, 2.5 cm diameter. Smaller  uterine nodules are also identified, including a fundal lesion 17 x 12 x 15 mm likely cystic degeneration of a small leiomyoma and an additional subserosal leiomyoma 12 x 7 x 9 mm.  Endometrium Thickness: 3 mm thick. Obscured at fundal region by leiomyoma. No endometrial fluid. Right ovary Measurements: 2.7 x 1.8 x 2.4 cm.  Normal morphology without mass Left ovary Measurements: 3.8 x 3.0 x 3.6 cm. This corresponds to the CT finding. This does not demonstrate normal ovarian morphology, instead showing lobulated margins with no definite follicles visualized. Internal blood flow seen on color Doppler imaging. Finding is suspicious for metastatic disease to the LEFT ovary. Other findings Moderate amount of free pelvic fluid.  No additional adnexal masses. IMPRESSION: Multiple uterine leiomyomata, largest calcified shadowing 2.5 cm diameter. Abnormal LEFT ovary demonstrating lobulated margins and no follicles, suspicious for metastatic disease to LEFT ovary. Electronically Signed   By: Lavonia Dana M.D.   On: 08/22/2018 15:46   .   Outpatient Encounter Medications as of 09/02/2018  Medication Sig  . amLODipine (NORVASC) 2.5 MG tablet TAKE 1 TABLET(2.5 MG) BY MOUTH DAILY  . cholecalciferol (VITAMIN D) 1000 UNITS tablet Take 1,000 Units by mouth daily.  . mirtazapine (REMERON) 30 MG tablet Take 1 tablet (30 mg total) by mouth at bedtime.  Marland Kitchen osimertinib mesylate (TAGRISSO) 80 MG tablet Take 1 tablet (80 mg total) by mouth daily.  Marland Kitchen Phenyleph-Doxylamine-DM-APAP (NYQUIL SEVERE COLD/FLU PO) Take by mouth. Once at bedtime  . ranitidine (ZANTAC) 15 MG/ML syrup Take by mouth 2 (two) times daily.  . vitamin B-12 (CYANOCOBALAMIN) 100 MCG tablet Take 1 tablet by mouth daily.  . benzonatate (TESSALON) 100 MG capsule Take 1-2 capsules (100-200 mg total) by mouth 3 (three) times daily as needed. (Patient not taking: Reported on 08/21/2018)  . FeFum-FePoly-FA-B Cmp-C-Biot (INTEGRA PLUS) CAPS Take 1 capsule by mouth every morning. (Patient not taking: Reported on 09/02/2018)  . guaiFENesin-codeine (ROBITUSSIN AC) 100-10 MG/5ML syrup Take 5 mLs by mouth 3 (three) times daily as needed for cough. (Patient  not taking: Reported on 09/02/2018)   No facility-administered encounter medications on file as of 09/02/2018.    Allergies  Allergen Reactions  . Penicillins Palpitations and Other (See Comments)    Has patient had a PCN reaction causing immediate rash, facial/tongue/throat swelling, SOB or lightheadedness with hypotension: No Has patient had a PCN reaction causing severe rash involving mucus membranes or skin necrosis: No Has patient had a PCN reaction that required hospitalization No Has patient had a PCN reaction occurring within the last 10 years: Yes If all of the above answers are "NO", then may proceed with Cephalosporin use.    Past Medical History:  Diagnosis Date  . Allergy   . Anxiety   . External hemorrhoids   . Family history of breast cancer   . Family history of lung cancer   . Hyperlipidemia   . Hypertension   . Lung cancer metastatic to bone ([place]) 06/19/2012   bx=L 5th ribmetastatic Adenocarcinoma with known lung mass  . Radiation 07/11/12-07/24/12   Palliative lung tx 30 gray in 10 fx  . S/P radiation therapy 07/22/14-07/31/14   left lung/60gy/61f  . Status post chemoradiation    Tarceva  . Vitamin D deficiency    Past Surgical History:  Procedure Laterality Date  . APPENDECTOMY     done at time of ovarian surgery, not ruptured.  . OVARIAN CYST REMOVAL     twice - 1970's and 1980's  . SOFT  TISSUE BIOPSY  06/19/12   L 5th rib=metastatic adenocarcinoma        Past Gynecological History:   GYNECOLOGIC HISTORY:  . No LMP recorded. Patient is postmenopausal. unknown . Menarche: 72 years old . P 0 . Contraceptive none . HRT none  . Last Pap unsure Family Hx:  Family History  Problem Relation Age of Onset  . Lung cancer Brother   . Breast cancer Sister        dx early 83s, d 49  . Breast cancer Sister 60       d 55s   Social Hx:  Marland Kitchen Tobacco use: none . Alcohol use: none . Illicit Drug use: none . Illicit IV Drug use: none    Review of  Systems: Review of Systems  Constitutional: Positive for appetite change and fatigue.  Respiratory: Positive for cough and shortness of breath.   Gastrointestinal: Positive for constipation.  Genitourinary: Positive for frequency.   All other systems reviewed and are negative. early satiety  Vitals: Blood pressure (!) 141/56, pulse 94, temperature 97.6 F (36.4 C), temperature source Oral, resp. rate 20, height 5' 2"  (1.575 m), weight 140 lb 14.4 oz (63.9 kg), SpO2 97 %. Vitals:   09/02/18 1332  Weight: 138 lb (62.6 kg)  Height: 5' 2"  (1.575 m)    Vitals:   09/02/18 1332  BP: (!) 143/50  Pulse: 94  Resp: 18  Temp: 97.6 F (36.4 C)  SpO2: 96%   Body mass index is 25.24 kg/m.   Physical Exam: General :  Mild respiratory distress. Thin, 72 y.o., female in no apparent distress  General :  Well developed, 72 y.o., female in no apparent distress HEENT:  Normocephalic/atraumatic, symmetric, EOMI, eyelids normal Neck:   No visible masses.  Respiratory:  Respirations unlabored, no use of accessory muscles CV:   Deferred Breast:  Deferred Musculoskeletal: Normal muscle strength. Abdomen:  No visible masses or protrusion Extremities:  No visible edema or deformities Skin:   Normal inspection Neuro/Psych:  No focal motor deficit, no abnormal mental status. Normal gait. Normal affect. Alert and oriented to person, place, and time  Genito Urinary: 08/16/18 Vulva: Normal external female genitalia.  Bladder/urethra: Urethral meatus normal in size and location. No lesions or   masses, well supported bladder Speculum exam: Vagina: No lesion, no discharge, no bleeding. Cervix: Normal appearing, no lesions. Bimanual exam:  Uterus: Mobile.  Adnexa: Fullness left adnexa.  Rectovaginal:  Good tone, no cul de sac nodularity, no parametrial involvement or nodularity.   Assessment  Left adnexal mass in patient with Stage IV lung CA + family h/o breast cancer   Plan  1. Data  reviewed ? I independently reviewed the images from the ultrasound with the patient today and her daughter ? Both ovaries are enlarged for her age. The left is solid with internal doppler flow. That is worrisome ? Tumor markers, both CEA and CA125 elevated. I suspect with her widespread lung CA there is some non-specificity 2. Surgical consideration ? Metastatic disease to the adnexa is to be ruled out ? I presume if lung primary, her lung cancer providers would change her targeted therapy regimen 3. I worry about her tolerance of a major surgery (even laparoscopy) due to her pulmonary status. ? Request PCP clearance, possibly pulmonary if PCP will not clear her 4. I do not see anything in the omentum on my review. o The patient does have 2 family members with breast cancer. o Genetics was negative for pathologic  mutation 5. Procedure discussion  ? We discussed at least USO, possible BSO, possible omental sampling. ? I worry pathology won't be able to give [place] a definitive histology at frozen section and do not want to Exlap her based on a frozen section diagnosis. If pathology thinks this might be a serous CA then I will sample the omentum and perform BSO. ? I will have the office see if we can get pathology to have her lung cancer slides available to compare at frozen section ? I have my doubts that she can tolerate a long staging or debulking procedure. ? I plan to get at least PCP clearance, if not pulmonary. On her first visit with me she had quite a cough, but today is improved. ? We reviewed the risks, benefits, and alternatives of the procedure using the surgical sketch today and she was given a copy.  Face to face time with patient was 35 minutes. Over 50% of this time was spent on counseling and coordination of care.  Isabel Caprice, MD  09/02/2018, 5:34 PM    Cc: Jacqueline Bussing, NP (Referring Med Onc provider) Rutherford Guys, MD (PCP)

## 2018-08-30 ENCOUNTER — Telehealth: Payer: Self-pay | Admitting: Family Medicine

## 2018-08-30 ENCOUNTER — Encounter: Payer: Self-pay | Admitting: Licensed Clinical Social Worker

## 2018-08-30 ENCOUNTER — Telehealth: Payer: Self-pay | Admitting: Licensed Clinical Social Worker

## 2018-08-30 ENCOUNTER — Ambulatory Visit: Payer: Self-pay | Admitting: Licensed Clinical Social Worker

## 2018-08-30 DIAGNOSIS — Z803 Family history of malignant neoplasm of breast: Secondary | ICD-10-CM

## 2018-08-30 DIAGNOSIS — Z1379 Encounter for other screening for genetic and chromosomal anomalies: Secondary | ICD-10-CM | POA: Insufficient documentation

## 2018-08-30 DIAGNOSIS — Z801 Family history of malignant neoplasm of trachea, bronchus and lung: Secondary | ICD-10-CM

## 2018-08-30 DIAGNOSIS — C3492 Malignant neoplasm of unspecified part of left bronchus or lung: Secondary | ICD-10-CM

## 2018-08-30 NOTE — Telephone Encounter (Signed)
Niece calling back to advise the cough med is not working. She was thinking she could get a breathing machine of cpap just by calling the office. She states pt cannot sleep at night due to the cough.  Pt has lung cancer and would like to start the process of getting a machine to help with breathing and coughing at night.

## 2018-08-30 NOTE — Telephone Encounter (Signed)
Copied from [place] [id number]. Topic: General - Other >> Aug 30, 2018 10:00 AM Keene Breath wrote: Reason for CRM: Patient called to inform the doctor that she still has a very bad cough and the syrup is not working to help the cough.  Patient is not sleeping and really needs something for the cough.  Please advise.  CB# [phone]

## 2018-08-30 NOTE — Telephone Encounter (Signed)
Please advise. Dgaddy, CMA 

## 2018-08-30 NOTE — Telephone Encounter (Signed)
Spoke with both Ms. Lemoine and her niece, Pamala Hurry. Revealed negative genetic testing. This normal result is reassuring and indicates that it is unlikely Ms. Murdoch's cancer is due to a hereditary cause.  It is unlikely that there is an increased risk of another cancer due to a mutation in one of these genes.  However, genetic testing is not perfect, and cannot definitively rule out a hereditary cause.  It will be important for her to keep in contact with genetics to learn if any additional testing may be needed in the future. We also discussed that the children of Jacqueline Leonard's sister with breast cancer in her 103's could have genetic counseling and testing.

## 2018-08-30 NOTE — Progress Notes (Signed)
HPI:  Ms. Gibler was previously seen in the [facility] clinic on 08/20/2018 due to a personal and family history of cancer and concerns regarding a hereditary predisposition to cancer. Please refer to our prior cancer genetics clinic note for more information regarding Ms. Alfrey's medical, social and family histories, and our assessment and recommendations, at the time. Ms. Pluta recent genetic test results were disclosed to her, as well as recommendations warranted by these results. These results and recommendations are discussed in more detail below.  CANCER HISTORY:    Metastatic adenocarcinoma, pathology 06/19/12   06/27/2012 Initial Diagnosis    Metastatic adenocarcinoma, pathology 06/19/12      FAMILY HISTORY:  We obtained a detailed, 4-generation family history.  Significant diagnoses are listed below: Family History  Problem Relation Age of Onset  . Lung cancer Brother   . Breast cancer Sister        dx early 34s, d 73  . Breast cancer Sister 63       d 55s    Ms. Nicklin does not have children. She has four sisters and one brother. Two of her sisters had breast cancer. One was diagnosed in her early 71's and died at 67, and the other was diagnosed at 56 and died in her 53's. Her other two sisters are alive with no history of cancer, ages 89 and 78. Her brother had lung cancer and died at 65. All of her siblings have children, no history of cancer for them.  Ms. Shinsato's father died at 25 from smoking, but he did not have cancer. He had one sister who died in her late 58's. Ms. Hirschi believes one of her paternal cousins may have had cancer but she doesn't know what type. Ms. Mcfayden paternal grandmother died young, but she does not have any other information about her. She does not have information about her paternal grandfather.  Ms. Feuerstein's mother died at 42. She had a sister and two brothers, all who died over the age of 35, no cancer history.  No cancers in Ms. Doris's maternal cousins that she is aware of. Ms. Bauer does not have information about her maternal grandparents.  Ms. Rodick also notes that she and her siblings grew up in [place] near a [employer] engineer, contracting, and that many of her neighbors ended up developing cancer.   Ms. Leoni is unaware of previous family history of genetic testing for hereditary cancer risks. Patient's maternal and paternal ancestors are from [place]. There is no reported Ashkenazi Jewish ancestry. There is no known consanguinity.  GENETIC TEST RESULTS: Genetic testing performed through [facility]'s Common Hereditary Cancers Panel + EGFR reported out on 08/28/2018 showed no pathogenic mutations. The Common Hereditary Cancers Panel offered by [facility] includes sequencing and/or deletion duplication testing of the following 47 genes: APC, ATM, AXIN2, BARD1, BMPR1A, BRCA1, BRCA2, BRIP1, CDH1, CDKN2A (p14ARF), CDKN2A (p16INK4a), CKD4, CHEK2, CTNNA1, DICER1, EPCAM (Deletion/duplication testing only), GREM1 (promoter region deletion/duplication testing only), KIT, MEN1, MLH1, MSH2, MSH3, MSH6, MUTYH, NBN, NF1, NHTL1, PALB2, PDGFRA, PMS2, POLD1, POLE, PTEN, RAD50, RAD51C, RAD51D, SDHB, SDHC, SDHD, SMAD4, SMARCA4. STK11, TP53, TSC1, TSC2, and VHL.  The following genes were evaluated for sequence changes only: SDHA and HOXB13 c.251G>A variant only.  The test report will be scanned into EPIC and will be located under the Molecular Pathology section of the Results Review tab. A portion of the result report is included below for reference.     We discussed with Ms. Hukill that because  current genetic testing is not perfect, it is possible there may be a gene mutation in one of these genes that current testing cannot detect, but that chance is small.  We also discussed, that there could be another gene that has not yet been discovered, or that we have not yet tested, that is responsible for the cancer diagnoses in the  family. It is also possible there is a hereditary cause for the cancer in the family that Ms. Swindle did not inherit and therefore was not identified in her testing.  Therefore, it is important to remain in touch with cancer genetics in the future so that we can continue to offer Ms. Hobday the most up to date genetic testing.   ADDITIONAL GENETIC TESTING: We discussed with Ms. Herro that there are other genes that are associated with increased cancer risk that can be analyzed. The laboratories that offer this testing look at these additional genes via a hereditary cancer gene panel. Should Ms. Eastburn wish to pursue additional genetic testing, we are happy to discuss and coordinate this testing, at any time.    CANCER SCREENING RECOMMENDATIONS: This negative result means that we were unable to identify a hereditary cause for her personal and family history of cancer at this time. While reassuring, this result does not rule out a hereditary cause for her personal and family history of cancer. It is still possible that there could be genetic mutations that are undetectable by current technology, or genetic mutations in genes that have not been tested or identified to increase cancer risk. Therefore, it is recommended she continue to follow the cancer management and screening guidelines provided by her oncology and primary healthcare provider. An individual's cancer risk is not determined by genetic test results alone.  Overall cancer risk assessment includes additional factors such as personal medical history, family history, etc.  These should be used to make a personalized plan for cancer prevention and surveillance.    RECOMMENDATIONS FOR FAMILY MEMBERS:  Relatives in this family might be at some increased risk of developing cancer, over the general population risk, simply due to the family history of cancer.  We recommended women in this family have a yearly mammogram beginning at age 83, or 73  years younger than the earliest onset of cancer, an annual clinical breast exam, and perform monthly breast self-exams. Women in this family should also have a gynecological exam as recommended by their primary provider. All family members should have a colonoscopy by age 39 (or as directed by their doctors).  All family members should inform their physicians about the family history of cancer so their doctors can make the most appropriate screening recommendations for them.   It is also possible there is a hereditary cause for the cancer in Ms. Kainz's family that she did not inherit and therefore was not identified in her.   We recommended her other sisters and nieces/nephews have genetic counseling and testing given her sister's early breast cancer diagnosis. Ms. Zaro will let us know if we can be of any assistance in coordinating genetic counseling and/or testing for these family members.   FOLLOW-UP: Lastly, we discussed with Ms. Feinberg that cancer genetics is a rapidly advancing field and it is possible that new genetic tests will be appropriate for her and/or her family members in the future. We encouraged her to remain in contact with cancer genetics on an annual basis so we can update her personal and family histories and let her know  of advances in cancer genetics that may benefit this family.   Our contact number was provided. Ms. Pitz questions were answered to her satisfaction, and she knows she is welcome to call us at anytime with additional questions or concerns.   Epimenio Foot, MS Genetic Counselor [email or web address] Phone: [phone]

## 2018-09-02 ENCOUNTER — Other Ambulatory Visit: Payer: Self-pay | Admitting: Family Medicine

## 2018-09-02 ENCOUNTER — Inpatient Hospital Stay: Payer: Medicare Other | Attending: Internal Medicine | Admitting: Obstetrics

## 2018-09-02 ENCOUNTER — Encounter: Payer: Self-pay | Admitting: Obstetrics

## 2018-09-02 VITALS — BP 143/50 | HR 94 | Temp 97.6°F | Resp 18 | Ht 62.0 in | Wt 138.0 lb

## 2018-09-02 DIAGNOSIS — D398 Neoplasm of uncertain behavior of other specified female genital organs: Secondary | ICD-10-CM | POA: Diagnosis not present

## 2018-09-02 DIAGNOSIS — C801 Malignant (primary) neoplasm, unspecified: Secondary | ICD-10-CM

## 2018-09-02 DIAGNOSIS — R1900 Intra-abdominal and pelvic swelling, mass and lump, unspecified site: Secondary | ICD-10-CM | POA: Insufficient documentation

## 2018-09-02 NOTE — Telephone Encounter (Signed)
Patient Niece Amedeo Plenty called and stated that it is not a cpap machine that she is needing. Patient would like a nebulizer. Patient has cancer and believes the cough is due to cancer. Patient states that she does not want to come in for an appointment because she was just seen on 08/21/18. Please advise [phone]

## 2018-09-02 NOTE — Telephone Encounter (Signed)
Spoke with patient Dr. Pamella Leonard is going to call patients Oncologist .   Patient was advised and voiced understanding

## 2018-09-02 NOTE — Patient Instructions (Signed)
Preparing for your Surgery  Plan for surgery on September 23, 2018 with Jacqueline Leonard and Jacqueline Leonard to assist at [place] will be scheduled for a robotic assisted bilateral salpingo-oophorectomy, possible exploratory laparotomy, possible staging.  WE WILL NEED YOU TO MEET WITH PULMONOLOGY PRIOR TO SURGERY.  Pre-operative Testing -You will receive a phone call from presurgical testing at [facility] to arrange for a pre-operative testing appointment before your surgery.  This appointment normally occurs one to two weeks before your scheduled surgery.   -Bring your insurance card, copy of an advanced directive if applicable, medication list  -At that visit, you will be asked to sign a consent for a possible blood transfusion in case a transfusion becomes necessary during surgery.  The need for a blood transfusion is rare but having consent is a necessary part of your care.     -You should not be taking blood thinners or aspirin at least ten days prior to surgery unless instructed by your surgeon.  Day Before Surgery at [place] will be asked to take in a light diet the day before surgery.  Avoid carbonated beverages.  You will be advised to have nothing to eat or drink after midnight the evening before.    Eat a light diet the day before surgery.  Examples including soups, broths, toast, yogurt, mashed potatoes.  Things to avoid include carbonated beverages (fizzy beverages), raw fruits and raw vegetables, or beans.   If your bowels are filled with gas, your surgeon will have difficulty visualizing your pelvic organs which increases your surgical risks.  Your role in recovery Your role is to become active as soon as directed by your doctor, while still giving yourself time to heal.  Rest when you feel tired. You will be asked to do the following in order to speed your recovery:  - Cough and breathe deeply. This helps toclear and expand your lungs and  can prevent pneumonia. You may be given a spirometer to practice deep breathing. A staff member will show you how to use the spirometer. - Do mild physical activity. Walking or moving your legs help your circulation and body functions return to normal. A staff member will help you when you try to walk and will provide you with simple exercises. Do not try to get up or walk alone the first time. - Actively manage your pain. Managing your pain lets you move in comfort. We will ask you to rate your pain on a scale of zero to 10. It is your responsibility to tell your doctor or nurse where and how much you hurt so your pain can be treated.  Special Considerations -If you are diabetic, you may be placed on insulin after surgery to have closer control over your blood sugars to promote healing and recovery.  This does not mean that you will be discharged on insulin.  If applicable, your oral antidiabetics will be resumed when you are tolerating a solid diet.  -Your final pathology results from surgery should be available by the Friday after surgery and the results will be relayed to you when available.  -Jacqueline Leonard is the Surgeon that assists your GYN Oncologist with surgery.  The next day after your surgery you will either see your GYN Oncologist, Jacqueline Leonard, or Jacqueline Leonard.   Blood Transfusion Information WHAT IS A BLOOD TRANSFUSION? A transfusion is the replacement of blood or some of its parts. Blood is made up  of multiple cells which provide different functions.  Red blood cells carry oxygen and are used for blood loss replacement.  White blood cells fight against infection.  Platelets control bleeding.  Plasma helps clot blood.  Other blood products are available for specialized needs, such as hemophilia or other clotting disorders. BEFORE THE TRANSFUSION  Who gives blood for transfusions?   You may be able to donate blood to be used at a later date on yourself  (autologous donation).  Relatives can be asked to donate blood. This is generally not any safer than if you have received blood from a stranger. The same precautions are taken to ensure safety when a relative's blood is donated.  Healthy volunteers who are fully evaluated to make sure their blood is safe. This is blood bank blood. Transfusion therapy is the safest it has ever been in the practice of medicine. Before blood is taken from a donor, a complete history is taken to make sure that person has no history of diseases nor engages in risky social behavior (examples are intravenous drug use or sexual activity with multiple partners). The donor's travel history is screened to minimize risk of transmitting infections, such as malaria. The donated blood is tested for signs of infectious diseases, such as HIV and hepatitis. The blood is then tested to be sure it is compatible with you in order to minimize the chance of a transfusion reaction. If you or a relative donates blood, this is often done in anticipation of surgery and is not appropriate for emergency situations. It takes many days to process the donated blood. RISKS AND COMPLICATIONS Although transfusion therapy is very safe and saves many lives, the main dangers of transfusion include:   Getting an infectious disease.  Developing a transfusion reaction. This is an allergic reaction to something in the blood you were given. Every precaution is taken to prevent this. The decision to have a blood transfusion has been considered carefully by your caregiver before blood is given. Blood is not given unless the benefits outweigh the risks.

## 2018-09-03 NOTE — Telephone Encounter (Signed)
Requested Prescriptions  Pending Prescriptions Disp Refills  . amLODipine (NORVASC) 2.5 MG tablet [Pharmacy Med Name: AMLODIPINE BESYLATE 2.5MG  TABLETS] 90 tablet 0    Sig: TAKE 1 TABLET(2.5 MG) BY MOUTH DAILY     Cardiovascular:  Calcium Channel Blockers Passed - 09/02/2018  6:08 AM      Passed - Last BP in normal range    BP Readings from Last 1 Encounters:  09/02/18 (!) 143/50         Passed - Valid encounter within last 6 months    Recent Outpatient Visits          1 week ago Cough   [name], [name], MD   3 months ago Cough   [name], [name], MD   4 months ago Cough   Primary Care at [place], [place]   6 months ago Essential hypertension   [name], [name], MD   6 months ago Essential hypertension   [name], [name], MD

## 2018-09-03 NOTE — Telephone Encounter (Signed)
Pt niece, Pamala Hurry, calling to f/u on nebulizer. She said they have not heard anything since pt was notified Dr. Pamella Pert would call oncologist to discuss. Please advise.  Ph # [phone].

## 2018-09-04 ENCOUNTER — Telehealth: Payer: Self-pay

## 2018-09-04 ENCOUNTER — Encounter (HOSPITAL_COMMUNITY): Payer: Self-pay | Admitting: Family Medicine

## 2018-09-04 ENCOUNTER — Other Ambulatory Visit: Payer: Self-pay | Admitting: Family Medicine

## 2018-09-04 ENCOUNTER — Inpatient Hospital Stay (HOSPITAL_COMMUNITY)
Admission: EM | Admit: 2018-09-04 | Discharge: 2018-09-08 | DRG: 187 | Disposition: A | Discharge status: Home / Self Care | Payer: Medicare Other | Attending: Internal Medicine | Admitting: Internal Medicine

## 2018-09-04 ENCOUNTER — Other Ambulatory Visit: Payer: Self-pay

## 2018-09-04 ENCOUNTER — Telehealth: Payer: Self-pay | Admitting: Family Medicine

## 2018-09-04 ENCOUNTER — Telehealth: Payer: Self-pay | Admitting: Oncology

## 2018-09-04 ENCOUNTER — Emergency Department (HOSPITAL_COMMUNITY): Payer: Medicare Other

## 2018-09-04 ENCOUNTER — Telehealth: Payer: Self-pay | Admitting: *Deleted

## 2018-09-04 DIAGNOSIS — K7460 Unspecified cirrhosis of liver: Secondary | ICD-10-CM | POA: Diagnosis present

## 2018-09-04 DIAGNOSIS — C3492 Malignant neoplasm of unspecified part of left bronchus or lung: Secondary | ICD-10-CM

## 2018-09-04 DIAGNOSIS — Z9889 Other specified postprocedural states: Secondary | ICD-10-CM

## 2018-09-04 DIAGNOSIS — Z7952 Long term (current) use of systemic steroids: Secondary | ICD-10-CM

## 2018-09-04 DIAGNOSIS — R1900 Intra-abdominal and pelvic swelling, mass and lump, unspecified site: Secondary | ICD-10-CM | POA: Diagnosis present

## 2018-09-04 DIAGNOSIS — Z801 Family history of malignant neoplasm of trachea, bronchus and lung: Secondary | ICD-10-CM

## 2018-09-04 DIAGNOSIS — J90 Pleural effusion, not elsewhere classified: Principal | ICD-10-CM | POA: Diagnosis present

## 2018-09-04 DIAGNOSIS — N839 Noninflammatory disorder of ovary, fallopian tube and broad ligament, unspecified: Secondary | ICD-10-CM | POA: Diagnosis present

## 2018-09-04 DIAGNOSIS — C7951 Secondary malignant neoplasm of bone: Secondary | ICD-10-CM | POA: Diagnosis not present

## 2018-09-04 DIAGNOSIS — R0602 Shortness of breath: Secondary | ICD-10-CM | POA: Diagnosis not present

## 2018-09-04 DIAGNOSIS — R0603 Acute respiratory distress: Secondary | ICD-10-CM | POA: Diagnosis not present

## 2018-09-04 DIAGNOSIS — R918 Other nonspecific abnormal finding of lung field: Secondary | ICD-10-CM | POA: Diagnosis not present

## 2018-09-04 DIAGNOSIS — Z880 Allergy status to penicillin: Secondary | ICD-10-CM

## 2018-09-04 DIAGNOSIS — Z01811 Encounter for preprocedural respiratory examination: Secondary | ICD-10-CM

## 2018-09-04 DIAGNOSIS — C786 Secondary malignant neoplasm of retroperitoneum and peritoneum: Secondary | ICD-10-CM | POA: Diagnosis not present

## 2018-09-04 DIAGNOSIS — K5900 Constipation, unspecified: Secondary | ICD-10-CM | POA: Diagnosis present

## 2018-09-04 DIAGNOSIS — R188 Other ascites: Secondary | ICD-10-CM | POA: Diagnosis present

## 2018-09-04 DIAGNOSIS — E440 Moderate protein-calorie malnutrition: Secondary | ICD-10-CM

## 2018-09-04 DIAGNOSIS — Z79899 Other long term (current) drug therapy: Secondary | ICD-10-CM

## 2018-09-04 DIAGNOSIS — Z9981 Dependence on supplemental oxygen: Secondary | ICD-10-CM

## 2018-09-04 DIAGNOSIS — Z803 Family history of malignant neoplasm of breast: Secondary | ICD-10-CM

## 2018-09-04 DIAGNOSIS — D630 Anemia in neoplastic disease: Secondary | ICD-10-CM | POA: Diagnosis present

## 2018-09-04 DIAGNOSIS — K766 Portal hypertension: Secondary | ICD-10-CM | POA: Diagnosis not present

## 2018-09-04 DIAGNOSIS — I10 Essential (primary) hypertension: Secondary | ICD-10-CM | POA: Diagnosis present

## 2018-09-04 DIAGNOSIS — R000 Tachycardia, unspecified: Secondary | ICD-10-CM | POA: Diagnosis present

## 2018-09-04 DIAGNOSIS — Z6826 Body mass index (BMI) 26.0-26.9, adult: Secondary | ICD-10-CM

## 2018-09-04 LAB — CBC WITH DIFFERENTIAL/PLATELET
Abs Immature Granulocytes: 0.03 10*3/uL (ref 0.00–0.07)
BASOS ABS: 0 10*3/uL (ref 0.0–0.1)
Basophils Relative: 0 %
EOS ABS: 0.1 10*3/uL (ref 0.0–0.5)
EOS PCT: 1 %
HCT: 33.5 % — ABNORMAL LOW (ref 36.0–46.0)
Hemoglobin: 10.2 g/dL — ABNORMAL LOW (ref 12.0–15.0)
Immature Granulocytes: 0 %
Lymphocytes Relative: 12 %
Lymphs Abs: 1 10*3/uL (ref 0.7–4.0)
MCH: 27.5 pg (ref 26.0–34.0)
MCHC: 30.4 g/dL (ref 30.0–36.0)
MCV: 90.3 fL (ref 80.0–100.0)
Monocytes Absolute: 0.3 10*3/uL (ref 0.1–1.0)
Monocytes Relative: 4 %
Neutro Abs: 6.4 10*3/uL (ref 1.7–7.7)
Neutrophils Relative %: 83 %
Platelets: 166 10*3/uL (ref 150–400)
RBC: 3.71 MIL/uL — AB (ref 3.87–5.11)
RDW: 15.4 % (ref 11.5–15.5)
WBC: 7.7 10*3/uL (ref 4.0–10.5)
nRBC: 0 % (ref 0.0–0.2)

## 2018-09-04 LAB — I-STAT TROPONIN, ED: TROPONIN I, POC: 0 ng/mL (ref 0.00–0.08)

## 2018-09-04 LAB — BASIC METABOLIC PANEL
Anion gap: 11 (ref 5–15)
BUN: 17 mg/dL (ref 8–23)
CHLORIDE: 107 mmol/L (ref 98–111)
CO2: 25 mmol/L (ref 22–32)
CREATININE: 0.89 mg/dL (ref 0.44–1.00)
Calcium: 8.9 mg/dL (ref 8.9–10.3)
Glucose, Bld: 101 mg/dL — ABNORMAL HIGH (ref 70–99)
Potassium: 4 mmol/L (ref 3.5–5.1)
SODIUM: 143 mmol/L (ref 135–145)

## 2018-09-04 LAB — BRAIN NATRIURETIC PEPTIDE: B Natriuretic Peptide: 74.2 pg/mL (ref 0.0–100.0)

## 2018-09-04 LAB — I-STAT CG4 LACTIC ACID, ED: Lactic Acid, Venous: 1.83 mmol/L (ref 0.5–1.9)

## 2018-09-04 MED ORDER — ALBUTEROL (5 MG/ML) CONTINUOUS INHALATION SOLN
10.0000 mg/h | INHALATION_SOLUTION | RESPIRATORY_TRACT | Status: DC
  Administered 2018-09-04: 10 mg/h via RESPIRATORY_TRACT
  Filled 2018-09-04: qty 20

## 2018-09-04 MED ORDER — IOPAMIDOL (ISOVUE-370) INJECTION 76%
100.0000 mL | Freq: Once | INTRAVENOUS | Status: AC | PRN
  Administered 2018-09-05: 80 mL via INTRAVENOUS

## 2018-09-04 MED ORDER — METHYLPREDNISOLONE SODIUM SUCC 125 MG IJ SOLR
125.0000 mg | Freq: Once | INTRAMUSCULAR | Status: AC
  Administered 2018-09-04: 125 mg via INTRAVENOUS
  Filled 2018-09-04: qty 2

## 2018-09-04 MED ORDER — SODIUM CHLORIDE 0.9 % IJ SOLN
INTRAMUSCULAR | Status: AC
  Filled 2018-09-04: qty 50

## 2018-09-04 MED ORDER — SODIUM CHLORIDE 0.9 % IV BOLUS
1000.0000 mL | Freq: Once | INTRAVENOUS | Status: AC
  Administered 2018-09-04: 1000 mL via INTRAVENOUS

## 2018-09-04 MED ORDER — PREDNISONE 10 MG PO TABS: ORAL_TABLET | ORAL | 0 refills | Status: DC

## 2018-09-04 MED ORDER — IOPAMIDOL (ISOVUE-370) INJECTION 76%
INTRAVENOUS | Status: AC
  Filled 2018-09-04: qty 100

## 2018-09-04 NOTE — Telephone Encounter (Signed)
LMOVM for niece per Dr. Julien Nordmann, Dr. Pamella Pert will be calling in a Rx for pt's cough. Dr. Julien Nordmann will not be ordering a nebulizer for pt.

## 2018-09-04 NOTE — ED Triage Notes (Signed)
Patient reports she is experiencing a cough for the last 4-5 months but getting worse. Patient denies having a fever.

## 2018-09-04 NOTE — Telephone Encounter (Signed)
Copied from [place]. Topic: Quick Communication - See Telephone Encounter >> Sep 04, 2018 11:48 AM Conception Chancy, NT wrote: CRM for notification. See Telephone encounter for: 09/04/18.  Patient niece, Pamala Hurry, is calling and states she spoke with someone yesterday and was told that Dr. Pamella Pert was going to be getting intouch with Dr. Julien Nordmann about giving the patient a Nebulizer. She is calling to find out the status of that. Please contact.  [phone]

## 2018-09-04 NOTE — Telephone Encounter (Signed)
Please advise patient that Dr. Pamella Pert is going to call in a prescription of prednisone.  Once the patient tapers down and starts the 5 mg daily she will need to come in and see Dr. Pamella Pert for a follow up.  They will not be calling in a nebulizer at this time.

## 2018-09-04 NOTE — Telephone Encounter (Signed)
Call from pt niece in [place] regarding pt getting a nebulizer as she has a cough. Niece states Dr. Pamella Pert has spoke with MD about this and she is following up. Discussed with Niece I will review with MD and call back with any additional information.

## 2018-09-04 NOTE — Addendum Note (Signed)
Addended by: Rutherford Guys on: 09/04/2018 03:49 PM   Modules accepted: Orders

## 2018-09-04 NOTE — Telephone Encounter (Signed)
Called [facility] and asked if her surgery could be moved to 09/09/18 from 09/23/18 per Joylene John, NP.  She verbalized agreement and said she has a pre-op/lab appointment on 09/17/18.  Advised her that pre-op testing will call her today to reschedule for later this week.

## 2018-09-04 NOTE — Telephone Encounter (Signed)
Called Dr. Ardyth Gal office regarding clearance for surgery from a pulmonary standpoint.  Faxed clearance form to [phone].

## 2018-09-04 NOTE — ED Provider Notes (Signed)
[facility] DEPT Provider Note   CSN: [id number] Arrival date & time: 09/04/18  1956     History   Chief Complaint Chief Complaint  Patient presents with  . Cough    HPI Jacqueline Leonard is a 72 y.o. female.  Pt presents to the ED today with sob and cough.  Pt has a hx of metastatic lung adenocarcinoma.  She has been getting progressively sob over the last few months.  She has taken meds and they have not helped.  No fevers.       Past Medical History:  Diagnosis Date  . Allergy   . Anxiety   . External hemorrhoids   . Family history of breast cancer   . Family history of lung cancer   . Hyperlipidemia   . Hypertension   . Lung cancer metastatic to bone ([place]) 06/19/2012   bx=L 5th ribmetastatic Adenocarcinoma with known lung mass  . Radiation 07/11/12-07/24/12   Palliative lung tx 30 gray in 10 fx  . S/P radiation therapy 07/22/14-07/31/14   left lung/60gy/75fx  . Status post chemoradiation    Tarceva  . Vitamin D deficiency     Patient Active Problem List   Diagnosis Date Noted  . Pelvic mass 09/02/2018  . Genetic testing 08/30/2018  . Family history of breast cancer   . Family history of lung cancer   . UTI (urinary tract infection) 12/26/2016  . Acute cystitis without hematuria   . Hyponatremia   . Epistaxis 10/04/2016  . Adenocarcinoma of left lung, stage 4 ([place]) 06/13/2016  . Bone metastases ([place]) 04/11/2016  . Encounter for antineoplastic chemotherapy 02/14/2016  . Anemia in neoplastic disease 06/29/2015  . Pulmonary metastasis ([place]) 07/16/2014  . Vitamin B12 deficiency anemia 02/12/2014  . Secondary malignant neoplasm of bone and bone marrow ([place]) 06/29/2012  . Metastatic adenocarcinoma, pathology 06/19/12 06/27/2012  . Hypertension   . Hyperlipidemia   . Vitamin D deficiency     Past Surgical History:  Procedure Laterality Date  . APPENDECTOMY     done at time of ovarian surgery, not ruptured.  . OVARIAN CYST REMOVAL      twice - 1970's and 1980's  . SOFT TISSUE BIOPSY  06/19/12   L 5th rib=metastatic adenocarcinoma     OB History    Gravida  0   Para  0   Term  0   Preterm  0   AB  0   Living  0     SAB  0   TAB  0   Ectopic  0   Multiple  0   Live Births  0            Home Medications    Prior to Admission medications   Medication Sig Start Date End Date Taking? Authorizing Provider  amLODipine (NORVASC) 2.5 MG tablet TAKE 1 TABLET(2.5 MG) BY MOUTH DAILY 09/03/18   Rutherford Guys, MD  cholecalciferol (VITAMIN D) 1000 UNITS tablet Take 1,000 Units by mouth daily.    [provider]  mirtazapine (REMERON) 30 MG tablet Take 1 tablet (30 mg total) by mouth at bedtime. 08/21/18   Rutherford Guys, MD  osimertinib mesylate (TAGRISSO) 80 MG tablet Take 1 tablet (80 mg total) by mouth daily. 08/12/18   Maryanna Shape, NP  Phenyleph-Doxylamine-DM-APAP (NYQUIL SEVERE COLD/FLU PO) Take by mouth. Once at bedtime    [provider]  predniSONE (DELTASONE) 10 MG tablet 40mg  x1 day, then 30mg  x 1  day, then 20mg  x 1 day, then 10mg  x 1 day, then 5mg  (1/2 tab) daily until next appointment 09/04/18   Rutherford Guys, MD  ranitidine (ZANTAC) 15 MG/ML syrup Take by mouth 2 (two) times daily.    [provider]  vitamin B-12 (CYANOCOBALAMIN) 100 MCG tablet Take 1 tablet by mouth daily.    [provider]    Family History Family History  Problem Relation Age of Onset  . Lung cancer Brother   . Breast cancer Sister        dx early 59s, d 49  . Breast cancer Sister 44       d 2s    Social History Social History   Tobacco Use  . Smoking status: Never Smoker  . Smokeless tobacco: Never Used  Substance Use Topics  . Alcohol use: No  . Drug use: No     Allergies   Penicillins   Review of Systems Review of Systems  Respiratory: Positive for shortness of breath and wheezing.   All other systems reviewed and are negative.    Physical  Exam Updated Vital Signs BP (!) 142/61 (BP Location: Right Arm)   Pulse (!) 132   Temp 98.3 F (36.8 C) (Oral)   Resp (!) 33   SpO2 90%   Physical Exam  Constitutional: She is oriented to person, place, and time. She appears well-developed and well-nourished. She appears distressed.  HENT:  Head: Normocephalic and atraumatic.  Right Ear: External ear normal.  Left Ear: External ear normal.  Nose: Nose normal.  Mouth/Throat: Oropharynx is clear and moist.  Eyes: Pupils are equal, round, and reactive to light. Conjunctivae and EOM are normal.  Neck: Normal range of motion. Neck supple.  Cardiovascular: Normal heart sounds and intact distal pulses. Tachycardia present.  Pulmonary/Chest: Tachypnea noted. She has wheezes.  Abdominal: Soft. Bowel sounds are normal.  Musculoskeletal: Normal range of motion.  Neurological: She is alert and oriented to person, place, and time.  Skin: Skin is warm. Capillary refill takes less than 2 seconds.  Psychiatric: She has a normal mood and affect. Her behavior is normal. Judgment and thought content normal.  Nursing note and vitals reviewed.    ED Treatments / Results  Labs (all labs ordered are listed, but only abnormal results are displayed) Labs Reviewed  BASIC METABOLIC PANEL - Abnormal; Notable for the following components:      Result Value   Glucose, Bld 101 (*)    All other components within normal limits  CBC WITH DIFFERENTIAL/PLATELET - Abnormal; Notable for the following components:   RBC 3.71 (*)    Hemoglobin 10.2 (*)    HCT 33.5 (*)    All other components within normal limits  CULTURE, BLOOD (ROUTINE X 2)  CULTURE, BLOOD (ROUTINE X 2)  BRAIN NATRIURETIC PEPTIDE  I-STAT TROPONIN, ED  I-STAT CG4 LACTIC ACID, ED    EKG EKG Interpretation  Date/Time:  Wednesday September 04 2018 21:25:57 EDT Ventricular Rate:  101 PR Interval:    QRS Duration: 88 QT Interval:  340 QTC Calculation: 441 R Axis:   17 Text  Interpretation:  Age not entered, assumed to be  72 years old for purpose of ECG interpretation Sinus tachycardia Probable anteroseptal infarct, old Baseline wander in lead(s) V3 Since last tracing rate faster Confirmed by Isla Pence [phone]) on 09/04/2018 9:29:52 PM   Radiology Dg Chest Port 1 View  Result Date: 09/04/2018 CLINICAL DATA:  Cough for 5 months, getting worse. History  of hypertension. Nonsmoker. EXAM: PORTABLE CHEST 1 VIEW COMPARISON:  CT chest 07/18/2018 FINDINGS: Consolidative opacities in the right upper lung and left lower lung. Probable left pleural effusion. These changes were seen on the previous CT. Changes may be related to radiation therapy or superimposed pneumonia. Follow-up after resolution of acute process is recommended to exclude underlying obstructive lesion. Heart size is somewhat obscured by the parenchymal process but appears normal. No vascular congestion. No pneumothorax. IMPRESSION: Consolidative opacities in the right upper lung and left lower lung with left pleural effusion. Changes may be related to radiation therapy or superimposed pneumonia. Followup PA and lateral chest X-ray is recommended in 3-4 weeks to ensure resolution and exclude underlying obstructing lesion. Electronically Signed   By: Lucienne Capers M.D.   On: 09/04/2018 21:39    Procedures Procedures (including critical care time)  Medications Ordered in ED Medications  albuterol (PROVENTIL,VENTOLIN) solution continuous neb (10 mg/hr Nebulization New Bag/Given 09/04/18 2138)  sodium chloride 0.9 % injection (has no administration in time range)  iopamidol (ISOVUE-370) 76 % injection (has no administration in time range)  sodium chloride 0.9 % bolus 1,000 mL (1,000 mLs Intravenous New Bag/Given 09/04/18 2337)  methylPREDNISolone sodium succinate (SOLU-MEDROL) 125 mg/2 mL injection 125 mg (125 mg Intravenous Given 09/04/18 2150)  iopamidol (ISOVUE-370) 76 % injection 100 mL (80 mLs Intravenous  Contrast Given 09/05/18 0009)     Initial Impression / Assessment and Plan / ED Course  I have reviewed the triage vital signs and the nursing notes.  Pertinent labs & imaging results that were available during my care of the patient were reviewed by me and considered in my medical decision making (see chart for details).    Breathing has improved after neb and steroid.  She is still tachycardic, so a CT chest was ordered to check for PE vs. Pna.  IVFs given.    Pt signed out to Dr. Leonette Monarch pending CT chest results.    Final Clinical Impressions(s) / ED Diagnoses   Final diagnoses:  Adenocarcinoma of lung, stage 4, left ([place])  Acute respiratory distress    ED Discharge Orders    None       Isla Pence, MD 09/05/18 [phone]

## 2018-09-05 ENCOUNTER — Encounter (HOSPITAL_COMMUNITY): Payer: Self-pay | Admitting: Family Medicine

## 2018-09-05 ENCOUNTER — Inpatient Hospital Stay (HOSPITAL_COMMUNITY): Payer: Medicare Other

## 2018-09-05 ENCOUNTER — Other Ambulatory Visit: Payer: Self-pay

## 2018-09-05 DIAGNOSIS — C3490 Malignant neoplasm of unspecified part of unspecified bronchus or lung: Secondary | ICD-10-CM | POA: Diagnosis not present

## 2018-09-05 DIAGNOSIS — C384 Malignant neoplasm of pleura: Secondary | ICD-10-CM | POA: Diagnosis not present

## 2018-09-05 DIAGNOSIS — J910 Malignant pleural effusion: Secondary | ICD-10-CM | POA: Diagnosis not present

## 2018-09-05 DIAGNOSIS — I10 Essential (primary) hypertension: Secondary | ICD-10-CM | POA: Diagnosis present

## 2018-09-05 DIAGNOSIS — D630 Anemia in neoplastic disease: Secondary | ICD-10-CM

## 2018-09-05 DIAGNOSIS — E440 Moderate protein-calorie malnutrition: Secondary | ICD-10-CM | POA: Diagnosis present

## 2018-09-05 DIAGNOSIS — Z880 Allergy status to penicillin: Secondary | ICD-10-CM | POA: Diagnosis not present

## 2018-09-05 DIAGNOSIS — R918 Other nonspecific abnormal finding of lung field: Secondary | ICD-10-CM | POA: Diagnosis not present

## 2018-09-05 DIAGNOSIS — Z803 Family history of malignant neoplasm of breast: Secondary | ICD-10-CM | POA: Diagnosis not present

## 2018-09-05 DIAGNOSIS — Z6826 Body mass index (BMI) 26.0-26.9, adult: Secondary | ICD-10-CM | POA: Diagnosis not present

## 2018-09-05 DIAGNOSIS — R1900 Intra-abdominal and pelvic swelling, mass and lump, unspecified site: Secondary | ICD-10-CM | POA: Diagnosis not present

## 2018-09-05 DIAGNOSIS — Z7952 Long term (current) use of systemic steroids: Secondary | ICD-10-CM | POA: Diagnosis not present

## 2018-09-05 DIAGNOSIS — K766 Portal hypertension: Secondary | ICD-10-CM | POA: Diagnosis present

## 2018-09-05 DIAGNOSIS — C3492 Malignant neoplasm of unspecified part of left bronchus or lung: Secondary | ICD-10-CM

## 2018-09-05 DIAGNOSIS — Z801 Family history of malignant neoplasm of trachea, bronchus and lung: Secondary | ICD-10-CM | POA: Diagnosis not present

## 2018-09-05 DIAGNOSIS — K7460 Unspecified cirrhosis of liver: Secondary | ICD-10-CM | POA: Diagnosis present

## 2018-09-05 DIAGNOSIS — J90 Pleural effusion, not elsewhere classified: Secondary | ICD-10-CM | POA: Diagnosis present

## 2018-09-05 DIAGNOSIS — Z9981 Dependence on supplemental oxygen: Secondary | ICD-10-CM | POA: Diagnosis not present

## 2018-09-05 DIAGNOSIS — I340 Nonrheumatic mitral (valve) insufficiency: Secondary | ICD-10-CM | POA: Diagnosis not present

## 2018-09-05 DIAGNOSIS — R000 Tachycardia, unspecified: Secondary | ICD-10-CM | POA: Diagnosis present

## 2018-09-05 DIAGNOSIS — N839 Noninflammatory disorder of ovary, fallopian tube and broad ligament, unspecified: Secondary | ICD-10-CM | POA: Diagnosis present

## 2018-09-05 DIAGNOSIS — K5900 Constipation, unspecified: Secondary | ICD-10-CM | POA: Diagnosis present

## 2018-09-05 DIAGNOSIS — C786 Secondary malignant neoplasm of retroperitoneum and peritoneum: Secondary | ICD-10-CM | POA: Diagnosis present

## 2018-09-05 DIAGNOSIS — R0602 Shortness of breath: Secondary | ICD-10-CM | POA: Diagnosis not present

## 2018-09-05 DIAGNOSIS — Z79899 Other long term (current) drug therapy: Secondary | ICD-10-CM | POA: Diagnosis not present

## 2018-09-05 DIAGNOSIS — C7951 Secondary malignant neoplasm of bone: Secondary | ICD-10-CM | POA: Diagnosis present

## 2018-09-05 DIAGNOSIS — Z5111 Encounter for antineoplastic chemotherapy: Secondary | ICD-10-CM | POA: Diagnosis not present

## 2018-09-05 DIAGNOSIS — R188 Other ascites: Secondary | ICD-10-CM | POA: Diagnosis present

## 2018-09-05 LAB — CBC WITH DIFFERENTIAL/PLATELET
Abs Immature Granulocytes: 0.03 10*3/uL (ref 0.00–0.07)
BASOS ABS: 0 10*3/uL (ref 0.0–0.1)
Basophils Relative: 0 %
EOS PCT: 0 %
Eosinophils Absolute: 0 10*3/uL (ref 0.0–0.5)
HCT: 30 % — ABNORMAL LOW (ref 36.0–46.0)
Hemoglobin: 9 g/dL — ABNORMAL LOW (ref 12.0–15.0)
Immature Granulocytes: 1 %
Lymphocytes Relative: 2 %
Lymphs Abs: 0.1 10*3/uL — ABNORMAL LOW (ref 0.7–4.0)
MCH: 27.2 pg (ref 26.0–34.0)
MCHC: 30 g/dL (ref 30.0–36.0)
MCV: 90.6 fL (ref 80.0–100.0)
MONO ABS: 0.1 10*3/uL (ref 0.1–1.0)
Monocytes Relative: 1 %
NRBC: 0 % (ref 0.0–0.2)
Neutro Abs: 5 10*3/uL (ref 1.7–7.7)
Neutrophils Relative %: 96 %
PLATELETS: 131 10*3/uL — AB (ref 150–400)
RBC: 3.31 MIL/uL — AB (ref 3.87–5.11)
RDW: 15.4 % (ref 11.5–15.5)
WBC: 5.2 10*3/uL (ref 4.0–10.5)

## 2018-09-05 LAB — BASIC METABOLIC PANEL
ANION GAP: 9 (ref 5–15)
BUN: 18 mg/dL (ref 8–23)
CALCIUM: 8.9 mg/dL (ref 8.9–10.3)
CO2: 24 mmol/L (ref 22–32)
Chloride: 111 mmol/L (ref 98–111)
Creatinine, Ser: 0.84 mg/dL (ref 0.44–1.00)
Glucose, Bld: 229 mg/dL — ABNORMAL HIGH (ref 70–99)
Potassium: 3.6 mmol/L (ref 3.5–5.1)
Sodium: 144 mmol/L (ref 135–145)

## 2018-09-05 LAB — BODY FLUID CELL COUNT WITH DIFFERENTIAL
EOS FL: 73 %
Lymphs, Fluid: 20 %
MONOCYTE-MACROPHAGE-SEROUS FLUID: 3 % — AB (ref 50–90)
Neutrophil Count, Fluid: 4 % (ref 0–25)
Total Nucleated Cell Count, Fluid: 514 cu mm (ref 0–1000)

## 2018-09-05 LAB — ECHOCARDIOGRAM COMPLETE
HEIGHTINCHES: 61 in
WEIGHTICAEL: 2240 [oz_av]

## 2018-09-05 MED ORDER — ONDANSETRON HCL 4 MG/2ML IJ SOLN: 4.0000 mg | Freq: Four times a day (QID) | INTRAMUSCULAR | Status: DC | PRN

## 2018-09-05 MED ORDER — HYDROCODONE-ACETAMINOPHEN 5-325 MG PO TABS: 1.0000 | ORAL_TABLET | ORAL | Status: DC | PRN

## 2018-09-05 MED ORDER — PREDNISONE 5 MG PO TABS
5.0000 mg | ORAL_TABLET | Freq: Every day | ORAL | Status: DC
  Administered 2018-09-05 – 2018-09-08 (×4): 5 mg via ORAL
  Filled 2018-09-05 (×4): qty 1

## 2018-09-05 MED ORDER — ACETAMINOPHEN 650 MG RE SUPP: 650.0000 mg | Freq: Four times a day (QID) | RECTAL | Status: DC | PRN

## 2018-09-05 MED ORDER — ONDANSETRON HCL 4 MG PO TABS: 4.0000 mg | ORAL_TABLET | Freq: Four times a day (QID) | ORAL | Status: DC | PRN

## 2018-09-05 MED ORDER — ENSURE ENLIVE PO LIQD
237.0000 mL | Freq: Two times a day (BID) | ORAL | Status: DC
  Administered 2018-09-05 – 2018-09-08 (×7): 237 mL via ORAL

## 2018-09-05 MED ORDER — ENOXAPARIN SODIUM 40 MG/0.4ML ~~LOC~~ SOLN
40.0000 mg | SUBCUTANEOUS | Status: DC
  Administered 2018-09-05 – 2018-09-08 (×4): 40 mg via SUBCUTANEOUS
  Filled 2018-09-05 (×4): qty 0.4

## 2018-09-05 MED ORDER — POTASSIUM CHLORIDE CRYS ER 20 MEQ PO TBCR
40.0000 meq | EXTENDED_RELEASE_TABLET | Freq: Once | ORAL | Status: AC
  Administered 2018-09-05: 40 meq via ORAL
  Filled 2018-09-05: qty 2

## 2018-09-05 MED ORDER — ACETAMINOPHEN 325 MG PO TABS: 650.0000 mg | ORAL_TABLET | Freq: Four times a day (QID) | ORAL | Status: DC | PRN

## 2018-09-05 MED ORDER — OSIMERTINIB MESYLATE 80 MG PO TABS
80.0000 mg | ORAL_TABLET | Freq: Every day | ORAL | Status: DC
  Administered 2018-09-06 – 2018-09-08 (×3): 80 mg via ORAL

## 2018-09-05 MED ORDER — MIRTAZAPINE 15 MG PO TABS
30.0000 mg | ORAL_TABLET | Freq: Every day | ORAL | Status: DC
  Administered 2018-09-05 – 2018-09-07 (×3): 30 mg via ORAL
  Filled 2018-09-05 (×3): qty 2

## 2018-09-05 MED ORDER — VITAMIN B-12 100 MCG PO TABS
100.0000 ug | ORAL_TABLET | Freq: Every day | ORAL | Status: DC
  Administered 2018-09-05 – 2018-09-08 (×4): 100 ug via ORAL
  Filled 2018-09-05 (×4): qty 1

## 2018-09-05 MED ORDER — LIDOCAINE HCL 1 % IJ SOLN
INTRAMUSCULAR | Status: AC
  Filled 2018-09-05: qty 10

## 2018-09-05 MED ORDER — SODIUM CHLORIDE 0.9 % IV SOLN
INTRAVENOUS | Status: DC
  Administered 2018-09-05 (×2): via INTRAVENOUS

## 2018-09-05 MED ORDER — AMLODIPINE BESYLATE 5 MG PO TABS
2.5000 mg | ORAL_TABLET | Freq: Every day | ORAL | Status: DC
  Administered 2018-09-05 – 2018-09-08 (×4): 2.5 mg via ORAL
  Filled 2018-09-05 (×4): qty 1

## 2018-09-05 MED ORDER — SENNOSIDES-DOCUSATE SODIUM 8.6-50 MG PO TABS
1.0000 | ORAL_TABLET | Freq: Every evening | ORAL | Status: DC | PRN
  Administered 2018-09-06: 1 via ORAL
  Filled 2018-09-05: qty 1

## 2018-09-05 MED ORDER — VITAMIN D3 25 MCG (1000 UNIT) PO TABS
1000.0000 [IU] | ORAL_TABLET | Freq: Every day | ORAL | Status: DC
  Administered 2018-09-05 – 2018-09-08 (×4): 1000 [IU] via ORAL
  Filled 2018-09-05 (×4): qty 1

## 2018-09-05 MED ORDER — DOCUSATE SODIUM 100 MG PO CAPS
100.0000 mg | ORAL_CAPSULE | Freq: Every day | ORAL | Status: AC
  Administered 2018-09-06 – 2018-09-07 (×2): 100 mg via ORAL
  Filled 2018-09-05 (×2): qty 1

## 2018-09-05 NOTE — Plan of Care (Signed)
72 year old female with history of lung cancer metastasis to the bone adrenal mass scheduled for surgery later this month hypertension admitted with shortness of breath this morning.  She was admitted by my partner early this morning.  She has been dyspneic over months she is now found to have large left pleural effusion increased from prior study likely malignant scheduled for IR consult for thoracentesis.  Her last echo March 2017Left ventricle: Global LV longitudinal strain was normal at   -21.7% The cavity size was normal. Systolic function was normal.   The estimated ejection fraction was in the range of 55% to 60%.   Wall motion was normal; there were no regional wall motion   abnormalities. There was an increased relative contribution of   atrial contraction to ventricular filling. Doppler parameters are   consistent with abnormal left ventricular relaxation (grade 1   diastolic dysfunction). - Aortic valve: There was trivial regurgitation. - Mitral valve: There was trivial regurgitation. - Tricuspid valve: There was trivial regurgitation. - Pulmonic valve: There was trivial regurgitation.  Will check echocardiogram this admission to evaluate ejection fraction.

## 2018-09-05 NOTE — Procedures (Signed)
PROCEDURE SUMMARY:  Successful image-guided left thoracentesis. Yielded 1 liter of blood-tinged fluid. Patient tolerated procedure well. No immediate complications.  Specimen was sent for labs. CXR ordered.  Earley Abide PA-C 09/05/2018 12:56 PM

## 2018-09-05 NOTE — Telephone Encounter (Signed)
LMOVM - with message from Dr. Pamella Pert. Noted from chart - pt went to ED yesterday 09/04/2018.

## 2018-09-05 NOTE — ED Provider Notes (Signed)
I assumed care of this patient from Dr. Gilford Raid at [facility].  Please see their note for further details of Hx, PE.  Briefly patient is a 72 y.o. female who presented with shortness of breath and cough.  Patient noted to be tachypneic and tachycardic.  She has a history of metastatic cancer.  Currently awaiting CT PE study.   CTA negative for PE however it did reveal worsening left pleural effusion.  Right upper lobe consolidation was due to lymphatic metastases.  No evidence of pneumonia noted.  Patient was placed on oxygen for comfort and due to low sats in the 90s on room air.  Admitted to medicine for continued management.     Fatima Blank, MD 09/05/18 [phone]

## 2018-09-05 NOTE — Progress Notes (Addendum)
Aware of patient's admission for respiratory distress. Recommend we await thoracentesis results and if positive re-group about pursuing removal of adnexa. Therefore we will hold off on surgery plan for Monday. In addition, given the new pleural effusion, I think we should repeat a CT of the abdomen and pelvis tomorrow to see if she has carcinomatosis progressing to the point where IR may be able to obtain tissue diagnosis. Our office will followup.  ~15 minutes was spent visiting with the patient and I discussed the above with Dr. Julien Nordmann.   Our office number if any questions [phone] Precious Haws, MD Gynecologic Oncologist

## 2018-09-05 NOTE — H&P (Signed)
History and Physical    Jacqueline Leonard BDZ:[id number] DOB: 04/17/1946 DOA: 09/04/2018  PCP: Rutherford Guys, MD   Patient coming from: Home   Chief Complaint: Increasing SOB   HPI: Jacqueline Leonard is a 72 y.o. female with medical history significant for hypertension, lung cancer metastatic to bone, and adnexal mass tentatively scheduled for surgery later this month, now presenting to the emergency department for evaluation of progressive shortness of breath.  Patient reports that she has been somewhat dyspneic for several months, attributed to her cancer, but has experienced insidious worsening in recent days, now to the point where she is dyspneic with speech and minimal exertion, significantly limiting her activity.  She denies any chest pain or palpitations, denies fevers or chills, and denies any change in her chronic cough.  She has not noted any lower extremity swelling or tenderness.  She follows with oncology, currently managed with Tagrisso and Delton See, and is following with gyn onc and tentatively planned for salpingo-oophorectomy later this month.  ED Course: Upon arrival to the ED, patient is found to be afebrile, saturating low 90s on room air, tachypneic, tachycardic, and with normal blood pressure.  EKG features sinus tachycardia with rate 101.  Chemistry panel is unremarkable and CBC features a stable chronic normocytic anemia.  Lactic acid is normal, troponin negative, and BNP also within normal limits.  CTA chest was performed and negative for PE, but notable for large left pleural effusion that is increased from prior, as well as progressive consolidation with patchy infiltrate throughout and interstitial and parenchymal nodularity suggestive of lymphangitic spread.  Blood cultures were collected in the ED, patient was given a liter of normal saline, continuous albuterol treatment, and 125 mg IV Solu-Medrol.  She reports no significant change with these measures, remains hemodynamically  stable, not in acute distress, but dyspneic with minimal exertion, and will be observed for ongoing evaluation and management.  Review of Systems:  All other systems reviewed and apart from HPI, are negative.  Past Medical History:  Diagnosis Date  . Allergy   . Anxiety   . External hemorrhoids   . Family history of breast cancer   . Family history of lung cancer   . Hyperlipidemia   . Hypertension   . Lung cancer metastatic to bone ([place]) 06/19/2012   bx=L 5th ribmetastatic Adenocarcinoma with known lung mass  . Radiation 07/11/12-07/24/12   Palliative lung tx 30 gray in 10 fx  . S/P radiation therapy 07/22/14-07/31/14   left lung/60gy/2fx  . Status post chemoradiation    Tarceva  . Vitamin D deficiency     Past Surgical History:  Procedure Laterality Date  . APPENDECTOMY     done at time of ovarian surgery, not ruptured.  . OVARIAN CYST REMOVAL     twice - 1970's and 1980's  . SOFT TISSUE BIOPSY  06/19/12   L 5th rib=metastatic adenocarcinoma     reports that she has never smoked. She has never used smokeless tobacco. She reports that she does not drink alcohol or use drugs.  Allergies  Allergen Reactions  . Penicillins Palpitations and Other (See Comments)    Has patient had a PCN reaction causing immediate rash, facial/tongue/throat swelling, SOB or lightheadedness with hypotension: No Has patient had a PCN reaction causing severe rash involving mucus membranes or skin necrosis: No Has patient had a PCN reaction that required hospitalization No Has patient had a PCN reaction occurring within the last 10 years: Yes If all of the above  answers are "NO", then may proceed with Cephalosporin use.    Family History  Problem Relation Age of Onset  . Lung cancer Brother   . Breast cancer Sister        dx early 21s, d 32  . Breast cancer Sister 50       d 91s     Prior to Admission medications   Medication Sig Start Date End Date Taking? Authorizing Provider  amLODipine  (NORVASC) 2.5 MG tablet TAKE 1 TABLET(2.5 MG) BY MOUTH DAILY Patient taking differently: Take 2.5 mg by mouth daily.  09/03/18  Yes Rutherford Guys, MD  cholecalciferol (VITAMIN D) 1000 UNITS tablet Take 1,000 Units by mouth daily.   Yes [provider]  mirtazapine (REMERON) 30 MG tablet Take 1 tablet (30 mg total) by mouth at bedtime. 08/21/18  Yes Rutherford Guys, MD  osimertinib mesylate (TAGRISSO) 80 MG tablet Take 1 tablet (80 mg total) by mouth daily. 08/12/18  Yes Curcio, Roselie Awkward, NP  Phenyleph-Doxylamine-DM-APAP (NYQUIL SEVERE COLD/FLU PO) Take 5 mLs by mouth daily as needed (cough). Once at bedtime    Yes [provider]  vitamin B-12 (CYANOCOBALAMIN) 100 MCG tablet Take 1 tablet by mouth daily.   Yes [provider]  predniSONE (DELTASONE) 10 MG tablet 40mg  x1 day, then 30mg  x 1 day, then 20mg  x 1 day, then 10mg  x 1 day, then 5mg  (1/2 tab) daily until next appointment 09/04/18   Rutherford Guys, MD    Physical Exam: Vitals:   09/04/18 2335 09/05/18 0000 09/05/18 0108 09/05/18 0130  BP: (!) 142/61 (!) 143/60 (!) 149/42 (!) 152/79  Pulse: (!) 132 (!) 128 (!) 125 (!) 125  Resp: (!) 33 (!) 38 17 (!) 28  Temp:      TempSrc:      SpO2: 90% 91% 92% 96%     Constitutional: NAD, calm  Eyes: PERTLA, lids and conjunctivae normal ENMT: Mucous membranes are moist. Posterior pharynx clear of any exudate or lesions.   Neck: normal, supple, no masses, no thyromegaly Respiratory: Diminished bilaterally, particularly on left. Dyspneic with speech. No accessory muscle use.  Cardiovascular: Rate ~110 and regular. No extremity edema. Abdomen: Mild distension, soft, no tenderness. Bowel sounds active.  Musculoskeletal: no clubbing / cyanosis. No joint deformity upper and lower extremities.    Skin: no significant rashes, lesions, ulcers. Warm, dry, well-perfused. Neurologic: CN 2-12 grossly intact. Sensation intact. Strength 5/5 in all 4 limbs.  Psychiatric:  Alert  and oriented x 3. Very pleasant, cooperative.    Labs on Admission: I have personally reviewed following labs and imaging studies  CBC: Recent Labs  Lab 09/04/18 2330  WBC 7.7  NEUTROABS 6.4  HGB 10.2*  HCT 33.5*  MCV 90.3  PLT 381   Basic Metabolic Panel: Recent Labs  Lab 09/04/18 2150  NA 143  K 4.0  CL 107  CO2 25  GLUCOSE 101*  BUN 17  CREATININE 0.89  CALCIUM 8.9   GFR: Estimated Creatinine Clearance: 49 mL/min (by C-G formula based on SCr of 0.89 mg/dL). Liver Function Tests: No results for input(s): AST, ALT, ALKPHOS, BILITOT, PROT, ALBUMIN in the last 168 hours. No results for input(s): LIPASE, AMYLASE in the last 168 hours. No results for input(s): AMMONIA in the last 168 hours. Coagulation Profile: No results for input(s): INR, PROTIME in the last 168 hours. Cardiac Enzymes: No results for input(s): CKTOTAL, CKMB, CKMBINDEX, TROPONINI in the last 168 hours. BNP (last 3 results) No  results for input(s): PROBNP in the last 8760 hours. HbA1C: No results for input(s): HGBA1C in the last 72 hours. CBG: No results for input(s): GLUCAP in the last 168 hours. Lipid Profile: No results for input(s): CHOL, HDL, LDLCALC, TRIG, CHOLHDL, LDLDIRECT in the last 72 hours. Thyroid Function Tests: No results for input(s): TSH, T4TOTAL, FREET4, T3FREE, THYROIDAB in the last 72 hours. Anemia Panel: No results for input(s): VITAMINB12, FOLATE, FERRITIN, TIBC, IRON, RETICCTPCT in the last 72 hours. Urine analysis:    Component Value Date/Time   COLORURINE YELLOW 12/26/2016 1135   APPEARANCEUR HAZY (A) 12/26/2016 1135   LABSPEC 1.021 12/26/2016 1135   LABSPEC 1.010 10/10/2013 1442   PHURINE 5.0 12/26/2016 1135   GLUCOSEU NEGATIVE 12/26/2016 1135   GLUCOSEU Negative 10/10/2013 1442   HGBUR LARGE (A) 12/26/2016 1135   BILIRUBINUR NEGATIVE 12/26/2016 1135   BILIRUBINUR Negative 10/10/2013 1442   KETONESUR NEGATIVE 12/26/2016 1135   PROTEINUR 30 (A) 12/26/2016 1135    UROBILINOGEN 0.2 10/10/2013 1442   NITRITE NEGATIVE 12/26/2016 1135   LEUKOCYTESUR MODERATE (A) 12/26/2016 1135   LEUKOCYTESUR Negative 10/10/2013 1442   Sepsis Labs: @LABRCNTIP (procalcitonin:4,lacticidven:4) )No results found for this or any previous visit (from the past 240 hour(s)).   Radiological Exams on Admission: Ct Angio Chest Pe W And/or Wo Contrast  Result Date: 09/05/2018 CLINICAL DATA:  Worsening cough for 5 months. Shortness of breath. History of lung cancer with metastasis. EXAM: CT ANGIOGRAPHY CHEST WITH CONTRAST TECHNIQUE: Multidetector CT imaging of the chest was performed using the standard protocol during bolus administration of intravenous contrast. Multiplanar CT image reconstructions and MIPs were obtained to evaluate the vascular anatomy. CONTRAST:  51mL ISOVUE-370 IOPAMIDOL (ISOVUE-370) INJECTION 76% COMPARISON:  07/18/2018 FINDINGS: Cardiovascular: Moderately good opacification of the central and segmental pulmonary arteries. No focal filling defects. No evidence of significant pulmonary embolus. Normal heart size. No pericardial effusion. Normal caliber thoracic aorta. No aortic dissection. Great vessel origins are patent. Calcification of the aorta. Mediastinum/Nodes: Mildly distended esophagus with thickened lower esophageal wall likely representing reflux disease or esophagitis. No significant lymphadenopathy in the chest. Lungs/Pleura: Motion artifact limits examination. There is a large right pleural effusion, increasing since previous study. There is consolidation with air bronchograms in the right upper and lower lung with complete consolidation and collapse of the left lower lung and patchy infiltrates throughout the remainder of the lungs. Interstitial and parenchymal nodularity suggesting lymphangitic metastasis. Left lower lobe bronchus is narrowed and constricted, likely indicating tumor involvement. No pneumothorax. Upper Abdomen: Upper abdominal ascites.  Musculoskeletal: Heterogeneous mixed lucent and sclerotic appearance of vertebrae and ribs consistent with bone metastasis. Expansile rib lesions are present. Vertebral hemangioma. Review of the MIP images confirms the above findings. IMPRESSION: 1. No evidence of significant pulmonary embolus. 2. Large right pleural effusion, increasing since previous study. 3. Progressing consolidation with air bronchograms in the right upper and lower lung with complete consolidation and collapse of the left lower lung and patchy infiltrates throughout the remainder of the lungs. Interstitial and parenchymal nodularity suggesting lymphangitic metastasis. 4. Diffuse bone metastasis. Aortic Atherosclerosis (ICD10-I70.0). Electronically Signed   By: Lucienne Capers M.D.   On: 09/05/2018 00:27   Dg Chest Port 1 View  Result Date: 09/04/2018 CLINICAL DATA:  Cough for 5 months, getting worse. History of hypertension. Nonsmoker. EXAM: PORTABLE CHEST 1 VIEW COMPARISON:  CT chest 07/18/2018 FINDINGS: Consolidative opacities in the right upper lung and left lower lung. Probable left pleural effusion. These changes were seen on the previous CT.  Changes may be related to radiation therapy or superimposed pneumonia. Follow-up after resolution of acute process is recommended to exclude underlying obstructive lesion. Heart size is somewhat obscured by the parenchymal process but appears normal. No vascular congestion. No pneumothorax. IMPRESSION: Consolidative opacities in the right upper lung and left lower lung with left pleural effusion. Changes may be related to radiation therapy or superimposed pneumonia. Followup PA and lateral chest X-ray is recommended in 3-4 weeks to ensure resolution and exclude underlying obstructing lesion. Electronically Signed   By: Lucienne Capers M.D.   On: 09/04/2018 21:39    EKG: Independently reviewed. Sinus tachycardia (raet 101).   Assessment/Plan   1. Pleural effusion  - Presents with  insidiously worsening SOB without fever, chills, increased cough, or chest pain  - She reports minimal improvment with steroids and breathing treatment - CTA is negative for PE but concerning for large left pleural effusion, increased from prior study, likely malignant  - Continue supportive care, as-needed supplemental O2, and consult with IR for thoracentesis    2. Adenocarcinoma of left lung, stage IV  - Following with oncology, s/p radiotherapy to left face and rib mets, stereotactic radiotherapy to LLL nodule, and Tarceva - Currently managed with Tagrisso and Delton See  - Continue Tagrisso, continue oncology follow-up   3. Pelvic mass  - Following with gyn onc and tentatively scheduled for surgery on 09/23/18    4. Hypertension  - BP at goal  - Continue Norvasc    DVT prophylaxis: Lovenox Code Status: Full  Family Communication: Discussed with patient  Consults called: None Admission status: Observation     Vianne Bulls, MD [facility] Pager [phone]  If 7PM-7AM, please contact night-coverage [email or web address] Password [email or web address]  09/05/2018, 2:10 AM

## 2018-09-05 NOTE — Progress Notes (Signed)
*  PRELIMINARY RESULTS* Echocardiogram 2D Echocardiogram has been performed.  Jacqueline Leonard 09/05/2018, 2:22 PM

## 2018-09-05 NOTE — Progress Notes (Signed)
Initial Nutrition Assessment  DOCUMENTATION CODES:   Non-severe (moderate) malnutrition in context of chronic illness  INTERVENTION:   Ensure Enlive BID, each providing 350 kcal and 20 g protein, given poor appetite. MVI with minerals given iron status and poor PO intake.  NUTRITION DIAGNOSIS:   Moderate Malnutrition related to chronic illness, poor appetite as evidenced by moderate muscle depletion, mild fat depletion, edema, meal completion < 50%, per patient/family report.  GOAL:   Patient will meet greater than or equal to 90% of their needs  MONITOR:   PO intake, Supplement acceptance, Weight trends  REASON FOR ASSESSMENT:   Malnutrition Screening Tool    ASSESSMENT:   72 yo female, admitted for progressive shortness of breath. PMHx includes dyspnea, HTN, lung cancer metastatic to bone, adnexal mass (scheduled for surgery later in Oct).   Labs: glucose 229, Hgb 9.0, Hct 30 Meds: cholecalciferol, remeron, potassium chloride SA  Pt resting at time of visit. Reports no appetite for the last 2-3 weeks. States she was eating a little before, but now does not feel like eating at all. Typically eats 1 meal/day even when feeling well. Does not follow any special diet or take MVI.   Reports UBW at 194# (88.2 kg) 4 years ago and 145# (65.9 kg) in May 2019.   Denies nausea, vomiting, or diarrhea, or difficulty chewing or swallowing. Endorses constipation worsened by medications. Last BM 3 days ago, but pt does not feel any discomfort.  NUTRITION - FOCUSED PHYSICAL EXAM:    Most Recent Value  Orbital Region  Mild depletion  Upper Arm Region  Mild depletion  Thoracic and Lumbar Region  No depletion  Buccal Region  Mild depletion  Temple Region  Moderate depletion  Clavicle Bone Region  No depletion  Clavicle and Acromion Bone Region  No depletion  Scapular Bone Region  No depletion  Dorsal Hand  Moderate depletion  Patellar Region  Moderate depletion  Anterior Thigh  Region  Moderate depletion  Posterior Calf Region  Moderate depletion  Edema (RD Assessment)  Mild  Hair  Reviewed  Eyes  Reviewed  Mouth  Other (Comment) [white patches]  Skin  Reviewed  Nails  Reviewed      Diet Order:   Diet Order            Diet Heart Room service appropriate? Yes; Fluid consistency: Thin  Diet effective now              EDUCATION NEEDS:   No education needs have been identified at this time  Skin:  Skin Assessment: Reviewed RN Assessment  Last BM:  no BM  Height:   Ht Readings from Last 1 Encounters:  09/05/18 5\' 1"  (1.549 m)    Weight:   Wt Readings from Last 1 Encounters:  09/05/18 63.5 kg    Ideal Body Weight:  50 kg  BMI:  Body mass index is 26.45 kg/m.  Estimated Nutritional Needs:   Kcal:  6945-0388 kcal/day(30-35 kcal/kg ABW)  Protein:  76-95 g protein/day(1.2-1.5 g/kg ABW)  Fluid:  1 mL/kcal or per MD discretion   Althea Grimmer, MS, RDN, LDN On-call pager: [phone]

## 2018-09-05 NOTE — ED Notes (Signed)
ED TO INPATIENT HANDOFF REPORT  Name/Age/Gender Jacqueline Leonard 72 y.o. female  Code Status    Code Status Orders  (From admission, onward)         Start     Ordered   09/05/18 0208  Full code  Continuous     09/05/18 0209        Code Status History    This patient has a current code status but no historical code status.      Home/SNF/Other Home  Chief Complaint cough, throat pains  Level of Care/Admitting Diagnosis ED Disposition    ED Disposition Condition [facility] Area: [place] [[id number]]  Level of Care: Med-Surg [16]  Diagnosis: Pleural effusion [517616]  Admitting Physician: Vianne Bulls [[id number]]  Attending Physician: Vianne Bulls [[id number]]  Estimated length of stay: past midnight tomorrow  Certification:: I certify this patient will need inpatient services for at least 2 midnights  PT Class (Do Not Modify): Inpatient [101]  PT Acc Code (Do Not Modify): Private [1]       Medical History Past Medical History:  Diagnosis Date  . Allergy   . Anxiety   . External hemorrhoids   . Family history of breast cancer   . Family history of lung cancer   . Hyperlipidemia   . Hypertension   . Lung cancer metastatic to bone ([place]) 06/19/2012   bx=L 5th ribmetastatic Adenocarcinoma with known lung mass  . Radiation 07/11/12-07/24/12   Palliative lung tx 30 gray in 10 fx  . S/P radiation therapy 07/22/14-07/31/14   left lung/60gy/17f  . Status post chemoradiation    [place]  . Vitamin D deficiency     Allergies Allergies  Allergen Reactions  . Penicillins Palpitations and Other (See Comments)    Has patient had a PCN reaction causing immediate rash, facial/tongue/throat swelling, SOB or lightheadedness with hypotension: No Has patient had a PCN reaction causing severe rash involving mucus membranes or skin necrosis: No Has patient had a PCN reaction that required hospitalization No Has patient had a PCN reaction  occurring within the last 10 years: Yes If all of the above answers are "NO", then may proceed with Cephalosporin use.    IV Location/Drains/Wounds Patient Lines/Drains/Airways Status   Active Line/Drains/Airways    Name:   Placement date:   Placement time:   Site:   Days:   Peripheral IV 09/04/18 Left Antecubital   09/04/18    2147    Antecubital   1          Labs/Imaging Results for orders placed or performed during the hospital encounter of 09/04/18 (from the past 48 hour(s))  Basic metabolic panel     Status: Abnormal   Collection Time: 09/04/18  9:50 PM  Result Value Ref Range   Sodium 143 135 - 145 mmol/L   Potassium 4.0 3.5 - 5.1 mmol/L   Chloride 107 98 - 111 mmol/L   CO2 25 22 - 32 mmol/L   Glucose, Bld 101 (H) 70 - 99 mg/dL   BUN 17 8 - 23 mg/dL   Creatinine, Ser 0.89 0.44 - 1.00 mg/dL   Calcium 8.9 8.9 - 10.3 mg/dL   GFR calc non Af Amer >60 >60 mL/min   GFR calc Af Amer >60 >60 mL/min    Comment: (NOTE) The eGFR has been calculated using the CKD EPI equation. This calculation has not been validated in all clinical situations. eGFR's persistently <60 mL/min signify possible Chronic  Kidney Disease.    Anion gap 11 5 - 15    Comment: Performed at [facility], [place] [address] [place], [place] [address]  I-stat troponin, ED     Status: None   Collection Time: 09/04/18  9:50 PM  Result Value Ref Range   Troponin i, poc 0.00 0.00 - 0.08 ng/mL   Comment 3            Comment: Due to the release kinetics of cTnI, a negative result within the first hours of the onset of symptoms does not rule out myocardial infarction with certainty. If myocardial infarction is still suspected, repeat the test at appropriate intervals.   Brain natriuretic peptide     Status: None   Collection Time: 09/04/18  9:50 PM  Result Value Ref Range   B Natriuretic Peptide 74.2 0.0 - 100.0 pg/mL    Comment: Performed at [facility], [place] [address] [place], [place] [address]  I-Stat CG4 Lactic Acid, ED     Status: None   Collection Time: 09/04/18 11:23 PM  Result Value Ref Range   Lactic Acid, Venous 1.83 0.5 - 1.9 mmol/L  CBC with Differential     Status: Abnormal   Collection Time: 09/04/18 11:30 PM  Result Value Ref Range   WBC 7.7 4.0 - 10.5 K/uL   RBC 3.71 (L) 3.87 - 5.11 MIL/uL   Hemoglobin 10.2 (L) 12.0 - 15.0 g/dL   HCT 33.5 (L) 36.0 - 46.0 %   MCV 90.3 80.0 - 100.0 fL   MCH 27.5 26.0 - 34.0 pg   MCHC 30.4 30.0 - 36.0 g/dL   RDW 15.4 11.5 - 15.5 %   Platelets 166 150 - 400 K/uL   nRBC 0.0 0.0 - 0.2 %   Neutrophils Relative % 83 %   Neutro Abs 6.4 1.7 - 7.7 K/uL   Lymphocytes Relative 12 %   Lymphs Abs 1.0 0.7 - 4.0 K/uL   Monocytes Relative 4 %   Monocytes Absolute 0.3 0.1 - 1.0 K/uL   Eosinophils Relative 1 %   Eosinophils Absolute 0.1 0.0 - 0.5 K/uL   Basophils Relative 0 %   Basophils Absolute 0.0 0.0 - 0.1 K/uL   Immature Granulocytes 0 %   Abs Immature Granulocytes 0.03 0.00 - 0.07 K/uL    Comment: Performed at [facility], [place] [address], [place], [place] [address]   Ct Angio Chest Pe W And/or Wo Contrast  Result Date: 09/05/2018 CLINICAL DATA:  Worsening cough for 5 months. Shortness of breath. History of lung cancer with metastasis. EXAM: CT ANGIOGRAPHY CHEST WITH CONTRAST TECHNIQUE: Multidetector CT imaging of the chest was performed using the standard protocol during bolus administration of intravenous contrast. Multiplanar CT image reconstructions and MIPs were obtained to evaluate the vascular anatomy. CONTRAST:  45m ISOVUE-370 IOPAMIDOL (ISOVUE-370) INJECTION 76% COMPARISON:  07/18/2018 FINDINGS: Cardiovascular: Moderately good opacification of the central and segmental pulmonary arteries. No focal filling defects. No evidence of significant pulmonary embolus. Normal heart size. No pericardial effusion. Normal caliber thoracic aorta. No aortic dissection. Great vessel origins are  patent. Calcification of the aorta. Mediastinum/Nodes: Mildly distended esophagus with thickened lower esophageal wall likely representing reflux disease or esophagitis. No significant lymphadenopathy in the chest. Lungs/Pleura: Motion artifact limits examination. There is a large right pleural effusion, increasing since previous study. There is consolidation with air bronchograms in the right upper and lower lung with complete consolidation and collapse of the left lower  lung and patchy infiltrates throughout the remainder of the lungs. Interstitial and parenchymal nodularity suggesting lymphangitic metastasis. Left lower lobe bronchus is narrowed and constricted, likely indicating tumor involvement. No pneumothorax. Upper Abdomen: Upper abdominal ascites. Musculoskeletal: Heterogeneous mixed lucent and sclerotic appearance of vertebrae and ribs consistent with bone metastasis. Expansile rib lesions are present. Vertebral hemangioma. Review of the MIP images confirms the above findings. IMPRESSION: 1. No evidence of significant pulmonary embolus. 2. Large right pleural effusion, increasing since previous study. 3. Progressing consolidation with air bronchograms in the right upper and lower lung with complete consolidation and collapse of the left lower lung and patchy infiltrates throughout the remainder of the lungs. Interstitial and parenchymal nodularity suggesting lymphangitic metastasis. 4. Diffuse bone metastasis. Aortic Atherosclerosis (ICD10-I70.0). Electronically Signed   By: Lucienne Capers M.D.   On: 09/05/2018 00:27   Dg Chest Port 1 View  Result Date: 09/04/2018 CLINICAL DATA:  Cough for 5 months, getting worse. History of hypertension. Nonsmoker. EXAM: PORTABLE CHEST 1 VIEW COMPARISON:  CT chest 07/18/2018 FINDINGS: Consolidative opacities in the right upper lung and left lower lung. Probable left pleural effusion. These changes were seen on the previous CT. Changes may be related to radiation  therapy or superimposed pneumonia. Follow-up after resolution of acute process is recommended to exclude underlying obstructive lesion. Heart size is somewhat obscured by the parenchymal process but appears normal. No vascular congestion. No pneumothorax. IMPRESSION: Consolidative opacities in the right upper lung and left lower lung with left pleural effusion. Changes may be related to radiation therapy or superimposed pneumonia. Followup PA and lateral chest X-ray is recommended in 3-4 weeks to ensure resolution and exclude underlying obstructing lesion. Electronically Signed   By: Lucienne Capers M.D.   On: 09/04/2018 21:39   EKG Interpretation  Date/Time:  Wednesday September 04 2018 21:25:57 EDT Ventricular Rate:  101 PR Interval:    QRS Duration: 88 QT Interval:  340 QTC Calculation: 441 R Axis:   17 Text Interpretation:  Age not entered, assumed to be  72 years old for purpose of ECG interpretation Sinus tachycardia Probable anteroseptal infarct, old Baseline wander in lead(s) V3 Since last tracing rate faster Confirmed by Isla Pence [id number]) on 09/04/2018 9:29:52 PM   Pending Labs Unresulted Labs (From admission, onward)    Start     Ordered   09/12/18 0500  Creatinine, serum  (enoxaparin (LOVENOX)    CrCl >/= 30 ml/min)  Weekly,   R    Comments:  while on enoxaparin therapy    09/05/18 0209   09/05/18 0500  CBC WITH DIFFERENTIAL  Tomorrow morning,   R     09/05/18 0209   09/05/18 7711  Basic metabolic panel  Tomorrow morning,   R     09/05/18 0209   09/04/18 2313  Culture, blood (routine x 2)  BLOOD CULTURE X 2,   STAT     09/04/18 2312          Vitals/Pain Today's Vitals   09/05/18 0000 09/05/18 0108 09/05/18 0130 09/05/18 0200  BP: (!) 143/60 (!) 149/42 (!) 152/79 (!) 122/57  Pulse: (!) 128 (!) 125 (!) 125 (!) 123  Resp: (!) 38 17 (!) 28 (!) 27  Temp:      TempSrc:      SpO2: 91% 92% 96% 99%  PainSc:        Isolation Precautions No active  isolations  Medications Medications  albuterol (PROVENTIL,VENTOLIN) solution continuous neb (0 mg/hr Nebulization Stopped 09/04/18 2238)  sodium chloride 0.9 % injection (has no administration in time range)  iopamidol (ISOVUE-370) 76 % injection (has no administration in time range)  osimertinib mesylate (TAGRISSO) tablet 80 mg (has no administration in time range)  amLODipine (NORVASC) tablet 2.5 mg (has no administration in time range)  mirtazapine (REMERON) tablet 30 mg (has no administration in time range)  predniSONE (DELTASONE) tablet 5 mg (has no administration in time range)  vitamin B-12 (CYANOCOBALAMIN) tablet 100 mcg (has no administration in time range)  cholecalciferol (VITAMIN D) tablet 1,000 Units (has no administration in time range)  enoxaparin (LOVENOX) injection 40 mg (has no administration in time range)  0.9 %  sodium chloride infusion (has no administration in time range)  acetaminophen (TYLENOL) tablet 650 mg (has no administration in time range)    Or  acetaminophen (TYLENOL) suppository 650 mg (has no administration in time range)  HYDROcodone-acetaminophen (NORCO/VICODIN) 5-325 MG per tablet 1-2 tablet (has no administration in time range)  senna-docusate (Senokot-S) tablet 1 tablet (has no administration in time range)  ondansetron (ZOFRAN) tablet 4 mg (has no administration in time range)    Or  ondansetron (ZOFRAN) injection 4 mg (has no administration in time range)  methylPREDNISolone sodium succinate (SOLU-MEDROL) 125 mg/2 mL injection 125 mg (125 mg Intravenous Given 09/04/18 2150)  iopamidol (ISOVUE-370) 76 % injection 100 mL (80 mLs Intravenous Contrast Given 09/05/18 0009)  sodium chloride 0.9 % bolus 1,000 mL (0 mLs Intravenous Stopped 09/05/18 0102)    Mobility walks '

## 2018-09-06 ENCOUNTER — Ambulatory Visit: Payer: Medicare Other | Admitting: Family Medicine

## 2018-09-06 ENCOUNTER — Inpatient Hospital Stay (HOSPITAL_COMMUNITY): Payer: Medicare Other

## 2018-09-06 ENCOUNTER — Telehealth: Payer: Self-pay | Admitting: Family Medicine

## 2018-09-06 LAB — BASIC METABOLIC PANEL
ANION GAP: 6 (ref 5–15)
BUN: 19 mg/dL (ref 8–23)
CALCIUM: 8.7 mg/dL — AB (ref 8.9–10.3)
CHLORIDE: 106 mmol/L (ref 98–111)
CO2: 29 mmol/L (ref 22–32)
CREATININE: 0.72 mg/dL (ref 0.44–1.00)
GFR calc non Af Amer: >60 mL/min (ref >60)
Glucose, Bld: 80 mg/dL (ref 70–99)
Potassium: 3.9 mmol/L (ref 3.5–5.1)
SODIUM: 141 mmol/L (ref 135–145)

## 2018-09-06 LAB — CBC
HCT: 30.4 % — ABNORMAL LOW (ref 36.0–46.0)
HEMOGLOBIN: 9.1 g/dL — AB (ref 12.0–15.0)
MCH: 27.3 pg (ref 26.0–34.0)
MCHC: 29.9 g/dL — AB (ref 30.0–36.0)
MCV: 91.3 fL (ref 80.0–100.0)
Platelets: 121 10*3/uL — ABNORMAL LOW (ref 150–400)
RBC: 3.33 MIL/uL — ABNORMAL LOW (ref 3.87–5.11)
RDW: 15.6 % — AB (ref 11.5–15.5)
WBC: 8.9 10*3/uL (ref 4.0–10.5)
nRBC: 0 % (ref 0.0–0.2)

## 2018-09-06 LAB — GRAM STAIN: Gram Stain: NONE SEEN

## 2018-09-06 MED ORDER — ESCITALOPRAM OXALATE 10 MG PO TABS
10.0000 mg | ORAL_TABLET | Freq: Every day | ORAL | Status: DC
  Administered 2018-09-06 – 2018-09-08 (×3): 10 mg via ORAL
  Filled 2018-09-06 (×3): qty 1

## 2018-09-06 MED ORDER — BISACODYL 10 MG RE SUPP
10.0000 mg | Freq: Once | RECTAL | Status: AC
  Administered 2018-09-06: 10 mg via RECTAL
  Filled 2018-09-06: qty 1

## 2018-09-06 MED ORDER — IOHEXOL 300 MG/ML  SOLN
100.0000 mL | Freq: Once | INTRAMUSCULAR | Status: AC | PRN
  Administered 2018-09-06: 100 mL via INTRAVENOUS

## 2018-09-06 MED ORDER — SODIUM CHLORIDE 0.9 % IJ SOLN
INTRAMUSCULAR | Status: AC
  Filled 2018-09-06: qty 50

## 2018-09-06 MED ORDER — IOPAMIDOL (ISOVUE-300) INJECTION 61%
15.0000 mL | Freq: Once | INTRAVENOUS | Status: DC | PRN
  Administered 2018-09-06: 30 mL via ORAL
  Filled 2018-09-06: qty 30

## 2018-09-06 MED ORDER — IOPAMIDOL (ISOVUE-300) INJECTION 61%
INTRAVENOUS | Status: AC
  Filled 2018-09-06: qty 30

## 2018-09-06 NOTE — Progress Notes (Signed)
PROGRESS NOTE    Jacqueline Leonard  LSL:[id number] DOB: 04-30-1946 DOA: 09/04/2018 PCP: Rutherford Guys, MD  Brief [place] y.o. female with medical history significant for hypertension, lung cancer metastatic to bone, and adnexal mass tentatively scheduled for surgery later this month, now presenting to the emergency department for evaluation of progressive shortness of breath.  Patient reports that she has been somewhat dyspneic for several months, attributed to her cancer, but has experienced insidious worsening in recent days, now to the point where she is dyspneic with speech and minimal exertion, significantly limiting her activity.  She denies any chest pain or palpitations, denies fevers or chills, and denies any change in her chronic cough.  She has not noted any lower extremity swelling or tenderness.  She follows with oncology, currently managed with [facility] and Delton See, and is following with gyn onc and tentatively planned for salpingo-oophorectomy later this month.  ED Course: Upon arrival to the ED, patient is found to be afebrile, saturating low 90s on room air, tachypneic, tachycardic, and with normal blood pressure.  EKG features sinus tachycardia with rate 101.  Chemistry panel is unremarkable and CBC features a stable chronic normocytic anemia.  Lactic acid is normal, troponin negative, and BNP also within normal limits.  CTA chest was performed and negative for PE, but notable for large left pleural effusion that is increased from prior, as well as progressive consolidation with patchy infiltrate throughout and interstitial and parenchymal nodularity suggestive of lymphangitic spread.  Blood cultures were collected in the ED, patient was given a liter of normal saline, continuous albuterol treatment, and 125 mg IV Solu-Medrol.  She reports no significant change with these measures, remains hemodynamically stable, not in acute distress, but dyspneic with minimal exertion, and will be  observed for ongoing evaluation and management.  Assessment & Plan:   Principal Problem:   Pleural effusion Active Problems:   Hypertension   Anemia in neoplastic disease   Adenocarcinoma of lung, stage 4, left (HCC)   Pelvic mass in female   Malnutrition of moderate degree   #1 pleural effusion-patient with history of adenocarcinoma of the left lung stage IV followed by Dr. Julien Nordmann.  Patient also has a pelvic mass followed by GYN oncology and was scheduled for surgery 09/23/2018.  Reviewed GYN oncology notes.  Will schedule to have a CT of the abdomen and pelvis today.  He is status post 1 L of pleural fluid blood-tinged 09/05/2018.  The fluid appears red-colored blood-tinged and turbid.  However WBC count was only 514.  #2 pelvic mass followed by GYN oncology obtain CT of the abdomen and pelvis.  #3 hypertension continue Norvasc  #4 depression patient reports that she feels very low and hopeless at times.  I will start her on Lexapro 10 mg daily.  Patient lives alone she does have family in [place].  #5 constipation stool softeners ordered. DVT prophylaxis Lovenox Code Status full code Family Communication none Disposition Plan: Pending clinical improvement Consultants: Interventional radiology   Procedures: Left thoracentesis Antimicrobials: None  Subjective: She is still having shortness of breath however feels better after 1 L of fluid was removed yesterday.  She is still oxygen dependent.  Tells me that she feels very down and low and depressed at times with her new diagnosis and her medical conditions.  Objective: Vitals:   09/05/18 1230 09/05/18 1311 09/05/18 2114 09/06/18 0616  BP: (!) 150/63 (!) 143/60 (!) 125/50 (!) 140/48  Pulse:  95 84 73  Resp:  16 16 16   Temp:  98.4 F (36.9 C) 99.1 F (37.3 C) 97.9 F (36.6 C)  TempSrc:  Oral Oral Oral  SpO2:  100% 100% 98%  Weight:      Height:        Intake/Output Summary (Last 24 hours) at 09/06/2018  1159 Last data filed at 09/06/2018 1152 Gross per 24 hour  Intake 360 ml  Output -  Net 360 ml   Filed Weights   09/05/18 0346  Weight: 63.5 kg    Examination:  General exam: Appears mild distress on exertion Respiratory system: Decreased breath sound on the left base to auscultation.  Tachypneic. Cardiovascular system: S1 & S2 heard, RRR. No JVD, murmurs, rubs, gallops or clicks. No pedal edema. Gastrointestinal system: Abdomen is nondistended, soft and nontender. No organomegaly or masses felt. Normal bowel sounds heard. Central nervous system: Alert and oriented. No focal neurological deficits. Extremities: Symmetric 5 x 5 power. Skin: No rashes, lesions or ulcers Psychiatry melancholic    Data Reviewed: I have personally reviewed following labs and imaging studies  CBC: Recent Labs  Lab 09/04/18 2330 09/05/18 0432 09/06/18 0750  WBC 7.7 5.2 8.9  NEUTROABS 6.4 5.0  --   HGB 10.2* 9.0* 9.1*  HCT 33.5* 30.0* 30.4*  MCV 90.3 90.6 91.3  PLT 166 131* 063*   Basic Metabolic Panel: Recent Labs  Lab 09/04/18 2150 09/05/18 0432 09/06/18 0750  NA 143 144 141  K 4.0 3.6 3.9  CL 107 111 106  CO2 25 24 29   GLUCOSE 101* 229* 80  BUN 17 18 19   CREATININE 0.89 0.84 0.72  CALCIUM 8.9 8.9 8.7*   GFR: Estimated Creatinine Clearance: 53.5 mL/min (by C-G formula based on SCr of 0.72 mg/dL). Liver Function Tests: No results for input(s): AST, ALT, ALKPHOS, BILITOT, PROT, ALBUMIN in the last 168 hours. No results for input(s): LIPASE, AMYLASE in the last 168 hours. No results for input(s): AMMONIA in the last 168 hours. Coagulation Profile: No results for input(s): INR, PROTIME in the last 168 hours. Cardiac Enzymes: No results for input(s): CKTOTAL, CKMB, CKMBINDEX, TROPONINI in the last 168 hours. BNP (last 3 results) No results for input(s): PROBNP in the last 8760 hours. HbA1C: No results for input(s): HGBA1C in the last 72 hours. CBG: No results for input(s):  GLUCAP in the last 168 hours. Lipid Profile: No results for input(s): CHOL, HDL, LDLCALC, TRIG, CHOLHDL, LDLDIRECT in the last 72 hours. Thyroid Function Tests: No results for input(s): TSH, T4TOTAL, FREET4, T3FREE, THYROIDAB in the last 72 hours. Anemia Panel: No results for input(s): VITAMINB12, FOLATE, FERRITIN, TIBC, IRON, RETICCTPCT in the last 72 hours. Sepsis Labs: Recent Labs  Lab 09/04/18 2323  LATICACIDVEN 1.83    Recent Results (from the past 240 hour(s))  Culture, blood (routine x 2)     Status: None (Preliminary result)   Collection Time: 09/04/18 11:30 PM  Result Value Ref Range Status   Specimen Description BLOOD LEFT HAND  Final   Special Requests   Final    BOTTLES DRAWN AEROBIC AND ANAEROBIC Blood Culture adequate volume Performed at [facility], [place] [address], [place], [place] [address]    Culture PENDING  Incomplete   Report Status PENDING  Incomplete  Culture, blood (routine x 2)     Status: None (Preliminary result)   Collection Time: 09/04/18 11:31 PM  Result Value Ref Range Status   Specimen Description BLOOD RIGHT ARM  Final   Special Requests  Final    BOTTLES DRAWN AEROBIC AND ANAEROBIC Blood Culture results may not be optimal due to an excessive volume of blood received in culture bottles Performed at [facility] [address] [place], [place] [address]    Culture PENDING  Incomplete   Report Status PENDING  Incomplete  Gram stain     Status: None (Preliminary result)   Collection Time: 09/05/18 12:41 PM  Result Value Ref Range Status   Specimen Description FLUID PLEURAL LEFT  Final   Special Requests NONE  Final   Gram Stain   Final    NO WBC SEEN NO ORGANISMS SEEN Performed at [facility], [place] [address] [place], [place] [address]    Report Status PENDING  Incomplete         Radiology Studies: Dg Chest 1 View  Result Date: 09/05/2018 CLINICAL DATA:  Status post left thoracentesis EXAM: CHEST  1 VIEW  COMPARISON:  09/04/2018-chest x-ray 09/05/2018-CT chest FINDINGS: Persistent moderate left pleural effusion. Bilateral reticulonodular interstitial thickening. Left basilar airspace disease. Right apical airspace disease unchanged compared with 09/04/2018. No pneumothorax. Stable cardiomediastinal silhouette. No acute osseous abnormality. IMPRESSION: 1. Moderate left pleural effusion. No pneumothorax status post left thoracentesis. 2. Persistent right upper lobe and left lower lobe airspace disease concerning for pneumonia. 3. Bilateral reticulonodular interstitial thickening unchanged from multiple prior exams. These areas of better characterized on recent CT chest dated 09/05/2018. Electronically Signed   By: Kathreen Devoid   On: 09/05/2018 13:10   Ct Angio Chest Pe W And/or Wo Contrast  Result Date: 09/05/2018 CLINICAL DATA:  Worsening cough for 5 months. Shortness of breath. History of lung cancer with metastasis. EXAM: CT ANGIOGRAPHY CHEST WITH CONTRAST TECHNIQUE: Multidetector CT imaging of the chest was performed using the standard protocol during bolus administration of intravenous contrast. Multiplanar CT image reconstructions and MIPs were obtained to evaluate the vascular anatomy. CONTRAST:  22mL ISOVUE-370 IOPAMIDOL (ISOVUE-370) INJECTION 76% COMPARISON:  07/18/2018 FINDINGS: Cardiovascular: Moderately good opacification of the central and segmental pulmonary arteries. No focal filling defects. No evidence of significant pulmonary embolus. Normal heart size. No pericardial effusion. Normal caliber thoracic aorta. No aortic dissection. Great vessel origins are patent. Calcification of the aorta. Mediastinum/Nodes: Mildly distended esophagus with thickened lower esophageal wall likely representing reflux disease or esophagitis. No significant lymphadenopathy in the chest. Lungs/Pleura: Motion artifact limits examination. There is a large right pleural effusion, increasing since previous study. There is  consolidation with air bronchograms in the right upper and lower lung with complete consolidation and collapse of the left lower lung and patchy infiltrates throughout the remainder of the lungs. Interstitial and parenchymal nodularity suggesting lymphangitic metastasis. Left lower lobe bronchus is narrowed and constricted, likely indicating tumor involvement. No pneumothorax. Upper Abdomen: Upper abdominal ascites. Musculoskeletal: Heterogeneous mixed lucent and sclerotic appearance of vertebrae and ribs consistent with bone metastasis. Expansile rib lesions are present. Vertebral hemangioma. Review of the MIP images confirms the above findings. IMPRESSION: 1. No evidence of significant pulmonary embolus. 2. Large right pleural effusion, increasing since previous study. 3. Progressing consolidation with air bronchograms in the right upper and lower lung with complete consolidation and collapse of the left lower lung and patchy infiltrates throughout the remainder of the lungs. Interstitial and parenchymal nodularity suggesting lymphangitic metastasis. 4. Diffuse bone metastasis. Aortic Atherosclerosis (ICD10-I70.0). Electronically Signed   By: Lucienne Capers M.D.   On: 09/05/2018 00:27   Dg Chest Port 1 View  Result Date: 09/04/2018 CLINICAL DATA:  Cough for 5 months, getting worse. History of hypertension. Nonsmoker. EXAM: PORTABLE CHEST 1 VIEW COMPARISON:  CT chest 07/18/2018 FINDINGS: Consolidative opacities in the right upper lung and left lower lung. Probable left pleural effusion. These changes were seen on the previous CT. Changes may be related to radiation therapy or superimposed pneumonia. Follow-up after resolution of acute process is recommended to exclude underlying obstructive lesion. Heart size is somewhat obscured by the parenchymal process but appears normal. No vascular congestion. No pneumothorax. IMPRESSION: Consolidative opacities in the right upper lung and left lower lung with left  pleural effusion. Changes may be related to radiation therapy or superimposed pneumonia. Followup PA and lateral chest X-ray is recommended in 3-4 weeks to ensure resolution and exclude underlying obstructing lesion. Electronically Signed   By: Lucienne Capers M.D.   On: 09/04/2018 21:39   US Thoracentesis Asp Pleural Space W/img Guide  Result Date: 09/05/2018 INDICATION: Patient with history of lung cancer, respiratory distress, and left pleural effusion. Request is made for diagnostic and therapeutic left thoracentesis. EXAM: ULTRASOUND GUIDED DIAGNOSTIC AND THERAPEUTIC LEFT THORACENTESIS MEDICATIONS: 10 mL of 1% lidocaine COMPLICATIONS: None immediate. PROCEDURE: An ultrasound guided thoracentesis was thoroughly discussed with the patient and questions answered. The benefits, risks, alternatives and complications were also discussed. The patient understands and wishes to proceed with the procedure. Written consent was obtained. Ultrasound was performed to localize and mark an adequate pocket of fluid in the left chest. The area was then prepped and draped in the normal sterile fashion. 1% Lidocaine was used for local anesthesia. Under ultrasound guidance a 6 Fr Safe-T-Centesis catheter was introduced. Thoracentesis was performed. The catheter was removed and a dressing applied. FINDINGS: A total of approximately 1 L of blood-tinged fluid was removed. Samples were sent to the laboratory as requested by the clinical team. IMPRESSION: Successful ultrasound guided left thoracentesis yielding 1 L of pleural fluid. Read by: Earley Abide, PA-C Electronically Signed   By: Marybelle Killings M.D.   On: 09/05/2018 13:21        Scheduled Meds: . amLODipine  2.5 mg Oral Daily  . cholecalciferol  1,000 Units Oral Daily  . docusate sodium  100 mg Oral Daily  . enoxaparin (LOVENOX) injection  40 mg Subcutaneous Q24H  . feeding supplement (ENSURE ENLIVE)  237 mL Oral BID BM  . mirtazapine  30 mg Oral QHS  .  osimertinib mesylate  80 mg Oral Daily  . predniSONE  5 mg Oral Q breakfast  . vitamin B-12  100 mcg Oral Daily   Continuous Infusions:   LOS: 1 day     Georgette Shell, MD [facility] If 7PM-7AM, please contact night-coverage [email or web address] Password [email or web address] 09/06/2018, 11:59 AM

## 2018-09-06 NOTE — Telephone Encounter (Signed)
Ms. Nehring had an appt 09/06/2018 at 4:20pm with Dr. Pamella Pert. Her husband came into our clinic to let us know that she is in the hospital and won't be at this appt. As of right now the husband states that he does not know when she will be getting out of the hospital.

## 2018-09-07 ENCOUNTER — Inpatient Hospital Stay (HOSPITAL_COMMUNITY): Payer: Medicare Other

## 2018-09-07 MED ORDER — BISACODYL 10 MG RE SUPP
10.0000 mg | Freq: Once | RECTAL | Status: AC
  Administered 2018-09-07: 10 mg via RECTAL
  Filled 2018-09-07: qty 1

## 2018-09-07 MED ORDER — DOCUSATE SODIUM 100 MG PO CAPS: 100.0000 mg | ORAL_CAPSULE | Freq: Every day | ORAL | 0 refills | Status: DC

## 2018-09-07 MED ORDER — HYDROCODONE-ACETAMINOPHEN 5-325 MG PO TABS: 1.0000 | ORAL_TABLET | ORAL | 0 refills | Status: AC | PRN

## 2018-09-07 MED ORDER — BISACODYL 5 MG PO TBEC
10.0000 mg | DELAYED_RELEASE_TABLET | Freq: Once | ORAL | Status: AC
  Administered 2018-09-07: 10 mg via ORAL
  Filled 2018-09-07: qty 2

## 2018-09-07 MED ORDER — ONDANSETRON HCL 4 MG PO TABS: 4.0000 mg | ORAL_TABLET | Freq: Four times a day (QID) | ORAL | 0 refills | Status: DC | PRN

## 2018-09-07 MED ORDER — SENNOSIDES-DOCUSATE SODIUM 8.6-50 MG PO TABS: 1.0000 | ORAL_TABLET | Freq: Every evening | ORAL | Status: DC | PRN

## 2018-09-07 MED ORDER — PREDNISONE 5 MG PO TABS: 5.0000 mg | ORAL_TABLET | Freq: Every day | ORAL | 0 refills | Status: DC

## 2018-09-07 MED ORDER — LACTULOSE 10 GM/15ML PO SOLN
30.0000 g | Freq: Once | ORAL | Status: AC
  Administered 2018-09-07: 30 g via ORAL
  Filled 2018-09-07: qty 60

## 2018-09-07 MED ORDER — ESCITALOPRAM OXALATE 10 MG PO TABS: 10.0000 mg | ORAL_TABLET | Freq: Every day | ORAL | 0 refills | Status: DC

## 2018-09-07 NOTE — Progress Notes (Signed)
GYNECOLOGIC ONCOLOGY NOTE  I reviewed the CT abd/pelvis report and images from 09/06/18. This patient is followed by my partner, Dr Gerarda Fraction, for an enlarging left ovarian mass in the setting of stage IV lung cancer.  She was admitted for progressive respiratory distress associated with new pleural effusions (s/p thoracentesis, cytology pending).  The CT scan showed progression in ascites (though still low volume) and progressive peritoneal carcinomatosis and omental disease. The left ovarian mass is slightly larger.  The differential diagnosis for this clinical picture is: 1/ abdominal progression of her lung cancer 2/ new primary ovarian cancer  I suspect this is most likely progression of her lung cancer. However, given the current uncertainty and the importance in directing therapy of determining this diagnosis, I am recommending IR percutaneous biopsy of the omental cake. When I view the images, given her body habitus and proximity of the omental disease to the right lower quadrant, it seems reasonable that this might be attempted with ultrasound guidance.  She is not a surgical candidate given her respiratory distress, malnutrition and cirrhosis with evidence of portal hypertension on imaging. If ovarian cancer were to be diagnosed on cytology and histology of the thoracentesis and omental biopsy, she would most appropriately be treated with neoadjuvant chemotherapy (if considered appropriate in the setting of her stage IV lung cancer). An alternative approach would be palliative care/hospice.   Please call with questions.  Thereasa Solo, MD Cell [phone] Office [phone]

## 2018-09-07 NOTE — Evaluation (Signed)
Physical Therapy Evaluation Patient Details Name: Jacqueline Leonard MRN: [id number] DOB: [birth year] Today's Date: 09/07/2018   History of Present Illness  72 y.o. female with medical history significant for hypertension, lung cancer metastatic to bone, and adnexal mass tentatively scheduled for surgery later this month, now presenting to the emergency department for evaluation of progressive shortness of breath.  Patient reports that she has been somewhat dyspneic for several months, attributed to her cancer, but has experienced insidious worsening in recent days, now to the point where she is dyspneic with speech and minimal exertion, significantly limiting her activity  Clinical Impression  Pt assessment completed today. Pt prefers to ambulate with no assistive device. Pt did have a few staggered gait patterns with self correction. Encourage nursing to walk with pt in hallway if possible to continue to improve patients activity tolerance. Feel pt would benefit from [place] , however she may progress in hospital and may not need it or may decline it. Will continue to follow while she is here.     Follow Up Recommendations Home health PT(depending on progress, may not need HHPT (pt may decline) )    Equipment Recommendations       Recommendations for Other Services       Precautions / Restrictions Precautions Precautions: None      Mobility  Bed Mobility Overal bed mobility: Modified Independent                Transfers Overall transfer level: Modified independent Equipment used: None                Ambulation/Gait Ambulation/Gait assistance: Min assist Gait Distance (Feet): 100 Feet Assistive device: None Gait Pattern/deviations: Narrow base of support     General Gait Details: small BOS and crosses over with her step at times with self correction and min guard asssist. 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 Some self correction, but would beenfit from higher level gait activities to increase balance in functional activities                             Pertinent Vitals/Pain Pain Assessment: No/denies pain    Home Living Family/patient expects to be discharged to:: Private residence Living Arrangements: Alone Available Help at Discharge: Family;Friend(s);Available PRN/intermittently Type of Home: House Home Access: Level entry     Home Layout: One level        Prior Function Level of Independence: Independent               Hand Dominance        Extremity/Trunk Assessment        Lower Extremity Assessment Lower Extremity Assessment: Generalized weakness       Communication   Communication: No difficulties  Cognition Arousal/Alertness: Awake/alert Behavior During Therapy: WFL for tasks assessed/performed Overall Cognitive Status: Within Functional Limits for tasks assessed  General Comments      Exercises Other Exercises Other Exercises: worked on pursed lip breathing for purfusion and SOB after ambulation. Educated on supine and seated LE exercises for streghthening    Assessment/Plan    PT Assessment Patient needs continued PT services  PT Problem List Decreased strength;Decreased mobility;Decreased activity tolerance;Decreased balance       PT Treatment Interventions Gait training;Functional mobility training;Therapeutic activities;Therapeutic exercise;Balance training;Patient/family education    PT Goals (Current goals can be found in the Care Plan section)  Acute  Rehab PT Goals Patient Stated Goal: want to go home and feel better PT Goal Formulation: With patient Time For Goal Achievement: 09/21/18 Potential to Achieve Goals: Good    Frequency Min 3X/week   Barriers to discharge        Co-evaluation               AM-PAC PT "6 Clicks" Daily Activity  Outcome Measure Difficulty turning over in bed (including adjusting bedclothes, sheets and blankets)?: None Difficulty moving from lying on back to sitting on the side of the bed? : None Difficulty sitting down on and standing up from a chair with arms (e.g., wheelchair, bedside commode, etc,.)?: None Help needed moving to and from a bed to chair (including a wheelchair)?: A Little Help needed walking in hospital room?: A Little Help needed climbing 3-5 steps with a railing? : A Little 6 Click Score: 21    End of Session Equipment Utilized During Treatment: Gait belt Activity Tolerance: Patient tolerated treatment well Patient left: in bed;with call bell/phone within reach Nurse Communication: Mobility status PT Visit Diagnosis: Muscle weakness (generalized) (M62.81);Unsteadiness on feet (R26.81)    Time: 0300-9233 PT Time Calculation (min) (ACUTE ONLY): 30 min   Charges:   PT Evaluation $PT Eval Low Complexity: 1 Low PT Treatments $Gait Training: 8-22 mins        Clide Dales, PT Acute Rehabilitation Services Pager: [phone] Office: [phone] 09/07/2018   Clide Dales 09/07/2018, 1:45 PM

## 2018-09-07 NOTE — Discharge Summary (Signed)
Physician Discharge Summary  Jacqueline Leonard RJJ:[id number] DOB: 02/09/1946 DOA: 09/04/2018  PCP: Rutherford Guys, MD  Admit date: 09/04/2018 Discharge date: 09/07/2018  Admitted From: Home Disposition: Home Recommendations for Outpatient Follow-up:  1. Follow up with PCP in 1-2 weeks 2. Please obtain BMP/CBC in one week 3. Follow-up with Dr. Julien Nordmann in 1 to 2 weeks 4. Follow-up with GYN oncology Dr. Gerarda Fraction in 1 to 2 weeks  [place] none Equipment/Devices none Discharge Condition: Stable CODE STATUS: Full full code Diet recommendation: Cardiac Brief/Interim Summary:71 y.o.femalewith medical history significant forhypertension, lung cancer metastatic to bone, and adnexal mass tentatively scheduled for surgery later this month, now presenting to the emergency department for evaluation of progressive shortness of breath. Patient reports that she has been somewhat dyspneic for several months, attributed to her cancer, but has experienced insidious worsening in recent days, now to the point where she is dyspneic with speech and minimal exertion, significantly limiting her activity. She denies any chest pain or palpitations, denies fevers or chills, and denies any change in her chronic cough. She has not noted any lower extremity swelling or tenderness. She follows with oncology, currently managed with Tag[facility] and Delton See, and is following with gyn oncand tentatively plannedfor salpingo-oophorectomy later this month.  ED Course:Upon arrival to the ED, patient is found to be afebrile, saturating low 90s on room air, tachypneic, tachycardic, and with normal blood pressure. EKG features sinus tachycardia with rate 101. Chemistry panel is unremarkable and CBC features a stable chronic normocytic anemia. Lactic acid is normal, troponin negative, and BNP also within normal limits. CTA chest was performed and negative for PE, but notable for large left pleural effusion that is increased from  prior, as well as progressive consolidation with patchy infiltrate throughout and interstitial and parenchymal nodularity suggestive of lymphangitic spread. Blood cultures were collected in the ED, patient was given a liter of normal saline, continuous albuterol treatment, and 125 mg IV Solu-Medrol. She reports no significant change with these measures, remains hemodynamically stable, not in acute distress, but dyspneic with minimal exertion, and will be observed for ongoing evaluation and management. Discharge Diagnoses:  Principal Problem:   Pleural effusion Active Problems:   Hypertension   Anemia in neoplastic disease   Adenocarcinoma of lung, stage 4, left (HCC)   Pelvic mass in female   Malnutrition of moderate degree   #1 left pleural effusion with the patient with a history of adenocarcinoma of the left lung with ovarian mass CT scan of the abdomen and pelvis done shows increased peritoneal carcinomatosis with increased bilateral adnexal masses and omental caking and ascites increased size of several sclerotic bone mets increased bilateral pleural effusion and left lower lobe atelectasis or consolidation hepatic cirrhosis and portal venous hypertension.  Repeat chest x-ray 09/07/2018 showed large pleural effusion.  Patient scheduled to have IR removal of the pleural effusion 09/08/2018 and if she does well after the thoracentesis she will be discharged home. Discharge Instructions she will follow-up with GYN oncology for possible biopsy of the omental caking.  Discharge Instructions    Call MD for:  difficulty breathing, headache or visual disturbances   Complete by:  As directed    Call MD for:  redness, tenderness, or signs of infection (pain, swelling, redness, odor or green/yellow discharge around incision site)   Complete by:  As directed    Call MD for:  temperature >100.4   Complete by:  As directed    Diet - low sodium heart healthy  Complete by:  As directed    Increase  activity slowly   Complete by:  As directed      Allergies as of 09/07/2018      Reactions   Penicillins Palpitations, Other (See Comments)   Has patient had a PCN reaction causing immediate rash, facial/tongue/throat swelling, SOB or lightheadedness with hypotension: No Has patient had a PCN reaction causing severe rash involving mucus membranes or skin necrosis: No Has patient had a PCN reaction that required hospitalization No Has patient had a PCN reaction occurring within the last 10 years: Yes If all of the above answers are "NO", then may proceed with Cephalosporin use.      Medication List    TAKE these medications   amLODipine 2.5 MG tablet Commonly known as:  NORVASC TAKE 1 TABLET(2.5 MG) BY MOUTH DAILY What changed:  See the new instructions.   cholecalciferol 1000 units tablet Commonly known as:  VITAMIN D Take 1,000 Units by mouth daily.   docusate sodium 100 MG capsule Commonly known as:  COLACE Take 1 capsule (100 mg total) by mouth daily. Start taking on:  09/08/2018   escitalopram 10 MG tablet Commonly known as:  LEXAPRO Take 1 tablet (10 mg total) by mouth daily. Start taking on:  09/08/2018   HYDROcodone-acetaminophen 5-325 MG tablet Commonly known as:  NORCO/VICODIN Take 1-2 tablets by mouth every 4 (four) hours as needed for up to 7 days for moderate pain.   mirtazapine 30 MG tablet Commonly known as:  REMERON Take 1 tablet (30 mg total) by mouth at bedtime.   NYQUIL SEVERE COLD/FLU PO Take 5 mLs by mouth daily as needed (cough). Once at bedtime   ondansetron 4 MG tablet Commonly known as:  ZOFRAN Take 1 tablet (4 mg total) by mouth every 6 (six) hours as needed for nausea.   osimertinib mesylate 80 MG tablet Commonly known as:  TAGRISSO Take 1 tablet (80 mg total) by mouth daily.   predniSONE 5 MG tablet Commonly known as:  DELTASONE Take 1 tablet (5 mg total) by mouth daily with breakfast. Start taking on:  09/08/2018 What changed:     medication strength  how much to take  how to take this  when to take this  additional instructions   senna-docusate 8.6-50 MG tablet Commonly known as:  Senokot-S Take 1 tablet by mouth at bedtime as needed for mild constipation.   vitamin B-12 100 MCG tablet Commonly known as:  CYANOCOBALAMIN Take 1 tablet by mouth daily.      Follow-up Information    Rutherford Guys, MD Follow up.   Specialty:  Family Medicine Contact information: [address] [place] [address] [phone]        Curt Bears, MD Follow up.   Specialty:  Oncology Contact information: [place] [address] [phone]        Isabel Caprice, MD Follow up.   Specialty:  Gynecologic Oncology Contact information: [address] [place] [address] [phone]          Allergies  Allergen Reactions  . Penicillins Palpitations and Other (See Comments)    Has patient had a PCN reaction causing immediate rash, facial/tongue/throat swelling, SOB or lightheadedness with hypotension: No Has patient had a PCN reaction causing severe rash involving mucus membranes or skin necrosis: No Has patient had a PCN reaction that required hospitalization No Has patient had a PCN reaction occurring within the last 10 years: Yes If all  of the above answers are "NO", then may proceed with Cephalosporin use.     Consultations: Interventional radiology and GYN oncology   Procedures/Studies: Dg Chest 1 View  Result Date: 09/07/2018 CLINICAL DATA:  Shortness of breath.  Lung cancer. EXAM: CHEST  1 VIEW COMPARISON:  One-view chest x-ray 09/05/2018. CT of the chest 09/05/2018. FINDINGS: Large pleural effusion and interstitial disease on the left is unchanged. Right apical consolidation is stable. Aortic atherosclerosis is present. IMPRESSION: 1. Stable appearance of large left pleural effusion with associated interstitial and airspace disease. 2. Stable right apical  consolidation. Electronically Signed   By: San Morelle M.D.   On: 09/07/2018 10:43   Dg Chest 1 View  Result Date: 09/05/2018 CLINICAL DATA:  Status post left thoracentesis EXAM: CHEST  1 VIEW COMPARISON:  09/04/2018-chest x-ray 09/05/2018-CT chest FINDINGS: Persistent moderate left pleural effusion. Bilateral reticulonodular interstitial thickening. Left basilar airspace disease. Right apical airspace disease unchanged compared with 09/04/2018. No pneumothorax. Stable cardiomediastinal silhouette. No acute osseous abnormality. IMPRESSION: 1. Moderate left pleural effusion. No pneumothorax status post left thoracentesis. 2. Persistent right upper lobe and left lower lobe airspace disease concerning for pneumonia. 3. Bilateral reticulonodular interstitial thickening unchanged from multiple prior exams. These areas of better characterized on recent CT chest dated 09/05/2018. Electronically Signed   By: Kathreen Devoid   On: 09/05/2018 13:10   Ct Angio Chest Pe W And/or Wo Contrast  Result Date: 09/05/2018 CLINICAL DATA:  Worsening cough for 5 months. Shortness of breath. History of lung cancer with metastasis. EXAM: CT ANGIOGRAPHY CHEST WITH CONTRAST TECHNIQUE: Multidetector CT imaging of the chest was performed using the standard protocol during bolus administration of intravenous contrast. Multiplanar CT image reconstructions and MIPs were obtained to evaluate the vascular anatomy. CONTRAST:  82mL ISOVUE-370 IOPAMIDOL (ISOVUE-370) INJECTION 76% COMPARISON:  07/18/2018 FINDINGS: Cardiovascular: Moderately good opacification of the central and segmental pulmonary arteries. No focal filling defects. No evidence of significant pulmonary embolus. Normal heart size. No pericardial effusion. Normal caliber thoracic aorta. No aortic dissection. Great vessel origins are patent. Calcification of the aorta. Mediastinum/Nodes: Mildly distended esophagus with thickened lower esophageal wall likely representing  reflux disease or esophagitis. No significant lymphadenopathy in the chest. Lungs/Pleura: Motion artifact limits examination. There is a large right pleural effusion, increasing since previous study. There is consolidation with air bronchograms in the right upper and lower lung with complete consolidation and collapse of the left lower lung and patchy infiltrates throughout the remainder of the lungs. Interstitial and parenchymal nodularity suggesting lymphangitic metastasis. Left lower lobe bronchus is narrowed and constricted, likely indicating tumor involvement. No pneumothorax. Upper Abdomen: Upper abdominal ascites. Musculoskeletal: Heterogeneous mixed lucent and sclerotic appearance of vertebrae and ribs consistent with bone metastasis. Expansile rib lesions are present. Vertebral hemangioma. Review of the MIP images confirms the above findings. IMPRESSION: 1. No evidence of significant pulmonary embolus. 2. Large right pleural effusion, increasing since previous study. 3. Progressing consolidation with air bronchograms in the right upper and lower lung with complete consolidation and collapse of the left lower lung and patchy infiltrates throughout the remainder of the lungs. Interstitial and parenchymal nodularity suggesting lymphangitic metastasis. 4. Diffuse bone metastasis. Aortic Atherosclerosis (ICD10-I70.0). Electronically Signed   By: Lucienne Capers M.D.   On: 09/05/2018 00:27   Ct Abdomen Pelvis W Contrast  Result Date: 09/06/2018 CLINICAL DATA:  Diffuse abdominal pain. Unintentional weight loss. Metastatic non-small cell lung carcinoma. Ongoing chemotherapy. EXAM: CT ABDOMEN AND PELVIS WITH CONTRAST TECHNIQUE: Multidetector CT imaging of  the abdomen and pelvis was performed using the standard protocol following bolus administration of intravenous contrast. CONTRAST:  175mL OMNIPAQUE IOHEXOL 300 MG/ML  SOLN COMPARISON:  07/18/2018 FINDINGS: Lower Chest: Moderate bilateral pleural effusions,  increased in size since previous study. Increased left lower lobe atelectasis or collapse. Hepatobiliary: No hepatic masses identified. Hepatic cirrhosis is again demonstrated. Gallbladder is unremarkable. Pancreas:  No mass or inflammatory changes. Spleen: Within normal limits in size and appearance. Adrenals/Urinary Tract: Normal adrenal glands. Stable benign-appearing cyst in lower pole of left kidney. No renal masses identified. No evidence of ureteral calculi or hydronephrosis. Stomach/Bowel:  Unremarkable. Vascular/Lymphatic: No pathologically enlarged lymph nodes. No abdominal aortic aneurysm. Aortic atherosclerosis. Recanalization of paraumbilical veins again seen, consistent with portal venous hypertension. Reproductive: Central calcified uterine fibroid again seen. Mass in the left adnexa shows mild increase in size, currently measuring 4.1 x 4.0 cm on image 70/2 compared to 3.7 x 2.7 cm previously. New soft tissue mass is seen in the right adnexa measuring 3.3 x 2.1 cm on image 68/2. Mild ascites is increased since previous study. Diffuse soft tissue stranding throughout the omental fat, and peritoneal enhancement in the pelvic cul-de-sac, are both increased and consistent with peritoneal carcinomatosis. Other:  None. Musculoskeletal: Several sclerotic bone lesions in the lower thoracic and lumbar spine are increased in size since previous study, consistent with bone metastases. IMPRESSION: Increased peritoneal carcinomatosis, with increased bilateral adnexal masses, omental caking, and ascites. Increased size of several sclerotic bone metastases. Increased bilateral pleural effusions and left lower lobe atelectasis or consolidation. Hepatic cirrhosis and portal venous hypertension. Electronically Signed   By: Earle Gell M.D.   On: 09/06/2018 16:54   Dg Chest Port 1 View  Result Date: 09/04/2018 CLINICAL DATA:  Cough for 5 months, getting worse. History of hypertension. Nonsmoker. EXAM: PORTABLE  CHEST 1 VIEW COMPARISON:  CT chest 07/18/2018 FINDINGS: Consolidative opacities in the right upper lung and left lower lung. Probable left pleural effusion. These changes were seen on the previous CT. Changes may be related to radiation therapy or superimposed pneumonia. Follow-up after resolution of acute process is recommended to exclude underlying obstructive lesion. Heart size is somewhat obscured by the parenchymal process but appears normal. No vascular congestion. No pneumothorax. IMPRESSION: Consolidative opacities in the right upper lung and left lower lung with left pleural effusion. Changes may be related to radiation therapy or superimposed pneumonia. Followup PA and lateral chest X-ray is recommended in 3-4 weeks to ensure resolution and exclude underlying obstructing lesion. Electronically Signed   By: Lucienne Capers M.D.   On: 09/04/2018 21:39   US Pelvic Complete With Transvaginal  Result Date: 08/22/2018 CLINICAL DATA:  Abnormal CT exam with LEFT ovarian mass, history of non-small cell lung cancer EXAM: TRANSABDOMINAL AND TRANSVAGINAL ULTRASOUND OF PELVIS TECHNIQUE: Both transabdominal and transvaginal ultrasound examinations of the pelvis were performed. Transabdominal technique was performed for global imaging of the pelvis including uterus, ovaries, adnexal regions, and pelvic cul-de-sac. It was necessary to proceed with endovaginal exam following the transabdominal exam to visualize the uterus and ovaries. COMPARISON:  12/11/2017; correlation CT abdomen and pelvis 07/18/2018 FINDINGS: Uterus Measurements: 6.2 x 3.7 x 5.1 cm. Large calcified mass again identified within the central uterus consistent with a calcified leiomyoma, 2.5 cm diameter. Smaller uterine nodules are also identified, including a fundal lesion 17 x 12 x 15 mm likely cystic degeneration of a small leiomyoma and an additional subserosal leiomyoma 12 x 7 x 9 mm. Endometrium Thickness: 3 mm thick.  Obscured at fundal region  by leiomyoma. No endometrial fluid. Right ovary Measurements: 2.7 x 1.8 x 2.4 cm.  Normal morphology without mass Left ovary Measurements: 3.8 x 3.0 x 3.6 cm. This corresponds to the CT finding. This does not demonstrate normal ovarian morphology, instead showing lobulated margins with no definite follicles visualized. Internal blood flow seen on color Doppler imaging. Finding is suspicious for metastatic disease to the LEFT ovary. Other findings Moderate amount of free pelvic fluid.  No additional adnexal masses. IMPRESSION: Multiple uterine leiomyomata, largest calcified shadowing 2.5 cm diameter. Abnormal LEFT ovary demonstrating lobulated margins and no follicles, suspicious for metastatic disease to LEFT ovary. Electronically Signed   By: Lavonia Dana M.D.   On: 08/22/2018 15:46   US Thoracentesis Asp Pleural Space W/img Guide  Result Date: 09/05/2018 INDICATION: Patient with history of lung cancer, respiratory distress, and left pleural effusion. Request is made for diagnostic and therapeutic left thoracentesis. EXAM: ULTRASOUND GUIDED DIAGNOSTIC AND THERAPEUTIC LEFT THORACENTESIS MEDICATIONS: 10 mL of 1% lidocaine COMPLICATIONS: None immediate. PROCEDURE: An ultrasound guided thoracentesis was thoroughly discussed with the patient and questions answered. The benefits, risks, alternatives and complications were also discussed. The patient understands and wishes to proceed with the procedure. Written consent was obtained. Ultrasound was performed to localize and mark an adequate pocket of fluid in the left chest. The area was then prepped and draped in the normal sterile fashion. 1% Lidocaine was used for local anesthesia. Under ultrasound guidance a 6 Fr Safe-T-Centesis catheter was introduced. Thoracentesis was performed. The catheter was removed and a dressing applied. FINDINGS: A total of approximately 1 L of blood-tinged fluid was removed. Samples were sent to the laboratory as requested by the  clinical team. IMPRESSION: Successful ultrasound guided left thoracentesis yielding 1 L of pleural fluid. Read by: Earley Abide, PA-C Electronically Signed   By: Marybelle Killings M.D.   On: 09/05/2018 13:21    (Echo, Carotid, EGD, Colonoscopy, ERCP)    Subjective:   Discharge Exam: Vitals:   09/06/18 2148 09/07/18 0435  BP: (!) 129/50 (!) 145/54  Pulse: 72 74  Resp: 16 16  Temp: 97.8 F (36.6 C) (!) 97.5 F (36.4 C)  SpO2: 100% 99%   Vitals:   09/06/18 0616 09/06/18 1346 09/06/18 2148 09/07/18 0435  BP: (!) 140/48 (!) 126/54 (!) 129/50 (!) 145/54  Pulse: 73 82 72 74  Resp: 16 20 16 16   Temp: 97.9 F (36.6 C) 98 F (36.7 C) 97.8 F (36.6 C) (!) 97.5 F (36.4 C)  TempSrc: Oral Oral Oral Oral  SpO2: 98% 99% 100% 99%  Weight:      Height:        General: Pt is alert, awake, not in acute distress Cardiovascular: RRR, S1/S2 +, no rubs, no gallops Respiratory: CTA bilaterally, no wheezing, no rhonchi Abdominal: Soft, NT, ND, bowel sounds + Extremities: no edema, no cyanosis    The results of significant diagnostics from this hospitalization (including imaging, microbiology, ancillary and laboratory) are listed below for reference.     Microbiology: Recent Results (from the past 240 hour(s))  Culture, blood (routine x 2)     Status: None (Preliminary result)   Collection Time: 09/04/18 11:30 PM  Result Value Ref Range Status   Specimen Description BLOOD LEFT HAND  Final   Special Requests   Final    BOTTLES DRAWN AEROBIC AND ANAEROBIC Blood Culture adequate volume   Culture   Final    NO GROWTH 2 DAYS Performed at  [facility], [place] [address] [place], [place] [address]    Report Status PENDING  Incomplete  Culture, blood (routine x 2)     Status: None (Preliminary result)   Collection Time: 09/04/18 11:31 PM  Result Value Ref Range Status   Specimen Description BLOOD RIGHT ARM  Final   Special Requests   Final    BOTTLES DRAWN AEROBIC AND ANAEROBIC Blood  Culture results may not be optimal due to an excessive volume of blood received in culture bottles   Culture   Final    NO GROWTH 2 DAYS Performed at [facility], [place] [address], [place], [place] [address]    Report Status PENDING  Incomplete  Culture, body fluid-bottle     Status: None (Preliminary result)   Collection Time: 09/05/18 12:41 PM  Result Value Ref Range Status   Specimen Description FLUID PLEURAL LEFT  Final   Special Requests BOTTLES DRAWN AEROBIC AND ANAEROBIC  Final   Culture   Final    NO GROWTH 2 DAYS Performed at [facility], [place] [address] [place], [place] [address]    Report Status PENDING  Incomplete  Gram stain     Status: None   Collection Time: 09/05/18 12:41 PM  Result Value Ref Range Status   Specimen Description FLUID PLEURAL LEFT  Final   Special Requests NONE  Final   Gram Stain   Final    NO WBC SEEN NO ORGANISMS SEEN Performed at [facility], [address], [place], [place] [address]    Report Status 09/06/2018 FINAL  Final     Labs: BNP (last 3 results) Recent Labs    09/04/18 2150  BNP 97.9   Basic Metabolic Panel: Recent Labs  Lab 09/04/18 2150 09/05/18 0432 09/06/18 0750  NA 143 144 141  K 4.0 3.6 3.9  CL 107 111 106  CO2 25 24 29   GLUCOSE 101* 229* 80  BUN 17 18 19   CREATININE 0.89 0.84 0.72  CALCIUM 8.9 8.9 8.7*   Liver Function Tests: No results for input(s): AST, ALT, ALKPHOS, BILITOT, PROT, ALBUMIN in the last 168 hours. No results for input(s): LIPASE, AMYLASE in the last 168 hours. No results for input(s): AMMONIA in the last 168 hours. CBC: Recent Labs  Lab 09/04/18 2330 09/05/18 0432 09/06/18 0750  WBC 7.7 5.2 8.9  NEUTROABS 6.4 5.0  --   HGB 10.2* 9.0* 9.1*  HCT 33.5* 30.0* 30.4*  MCV 90.3 90.6 91.3  PLT 166 131* 121*   Cardiac Enzymes: No results for input(s): CKTOTAL, CKMB, CKMBINDEX, TROPONINI in the last 168 hours. BNP: Invalid input(s): POCBNP CBG: No results for  input(s): GLUCAP in the last 168 hours. D-Dimer No results for input(s): DDIMER in the last 72 hours. Hgb A1c No results for input(s): HGBA1C in the last 72 hours. Lipid Profile No results for input(s): CHOL, HDL, LDLCALC, TRIG, CHOLHDL, LDLDIRECT in the last 72 hours. Thyroid function studies No results for input(s): TSH, T4TOTAL, T3FREE, THYROIDAB in the last 72 hours.  Invalid input(s): FREET3 Anemia work up No results for input(s): VITAMINB12, FOLATE, FERRITIN, TIBC, IRON, RETICCTPCT in the last 72 hours. Urinalysis    Component Value Date/Time   COLORURINE YELLOW 12/26/2016 1135   APPEARANCEUR HAZY (A) 12/26/2016 1135   LABSPEC 1.021 12/26/2016 1135   LABSPEC 1.010 10/10/2013 1442   PHURINE 5.0 12/26/2016 1135   GLUCOSEU NEGATIVE 12/26/2016 1135   GLUCOSEU Negative 10/10/2013 1442   HGBUR  LARGE (A) 12/26/2016 1135   BILIRUBINUR NEGATIVE 12/26/2016 1135   BILIRUBINUR Negative 10/10/2013 1442   KETONESUR NEGATIVE 12/26/2016 1135   PROTEINUR 30 (A) 12/26/2016 1135   UROBILINOGEN 0.2 10/10/2013 1442   NITRITE NEGATIVE 12/26/2016 1135   LEUKOCYTESUR MODERATE (A) 12/26/2016 1135   LEUKOCYTESUR Negative 10/10/2013 1442   Sepsis Labs Invalid input(s): PROCALCITONIN,  WBC,  LACTICIDVEN Microbiology Recent Results (from the past 240 hour(s))  Culture, blood (routine x 2)     Status: None (Preliminary result)   Collection Time: 09/04/18 11:30 PM  Result Value Ref Range Status   Specimen Description BLOOD LEFT HAND  Final   Special Requests   Final    BOTTLES DRAWN AEROBIC AND ANAEROBIC Blood Culture adequate volume   Culture   Final    NO GROWTH 2 DAYS Performed at [facility], [place] [address] [place], [place] [address]    Report Status PENDING  Incomplete  Culture, blood (routine x 2)     Status: None (Preliminary result)   Collection Time: 09/04/18 11:31 PM  Result Value Ref Range Status   Specimen Description BLOOD RIGHT ARM  Final   Special Requests   Final     BOTTLES DRAWN AEROBIC AND ANAEROBIC Blood Culture results may not be optimal due to an excessive volume of blood received in culture bottles   Culture   Final    NO GROWTH 2 DAYS Performed at [facility], [place] [address] [place], [place] [address]    Report Status PENDING  Incomplete  Culture, body fluid-bottle     Status: None (Preliminary result)   Collection Time: 09/05/18 12:41 PM  Result Value Ref Range Status   Specimen Description FLUID PLEURAL LEFT  Final   Special Requests BOTTLES DRAWN AEROBIC AND ANAEROBIC  Final   Culture   Final    NO GROWTH 2 DAYS Performed at [facility], [place] [address] [place], [place] [address]    Report Status PENDING  Incomplete  Gram stain     Status: None   Collection Time: 09/05/18 12:41 PM  Result Value Ref Range Status   Specimen Description FLUID PLEURAL LEFT  Final   Special Requests NONE  Final   Gram Stain   Final    NO WBC SEEN NO ORGANISMS SEEN Performed at [facility], [address] [place], [place] [address]    Report Status 09/06/2018 FINAL  Final     Time coordinating discharge:33 minutes  SIGNED:   Georgette Shell, MD  [facility] 09/07/2018, 10:48 AM Pager   If 7PM-7AM, please contact night-coverage [email or web address] Password [email or web address]

## 2018-09-07 NOTE — Progress Notes (Signed)
PROGRESS NOTE    Daneya Hartgrove  JHE:[id number] DOB: 06/13/1946 DOA: 09/04/2018 PCP: Rutherford Guys, MD  Brief Narrative:72 y.o.femalewith medical history significant forhypertension, lung cancer metastatic to bone, and adnexal mass tentatively scheduled for surgery later this month, now presenting to the emergency department for evaluation of progressive shortness of breath. Patient reports that she has been somewhat dyspneic for several months, attributed to her cancer, but has experienced insidious worsening in recent days, now to the point where she is dyspneic with speech and minimal exertion, significantly limiting her activity. She denies any chest pain or palpitations, denies fevers or chills, and denies any change in her chronic cough. She has not noted any lower extremity swelling or tenderness. She follows with oncology, currently managed with Tagrisso and Delton See, and is following with gyn oncand tentatively plannedfor salpingo-oophorectomy later this month.  ED Course:Upon arrival to the ED, patient is found to be afebrile, saturating low 90s on room air, tachypneic, tachycardic, and with normal blood pressure. EKG features sinus tachycardia with rate 101. Chemistry panel is unremarkable and CBC features a stable chronic normocytic anemia. Lactic acid is normal, troponin negative, and BNP also within normal limits. CTA chest was performed and negative for PE, but notable for large left pleural effusion that is increased from prior, as well as progressive consolidation with patchy infiltrate throughout and interstitial and parenchymal nodularity suggestive of lymphangitic spread. Blood cultures were collected in the ED, patient was given a liter of normal saline, continuous albuterol treatment, and 125 mg IV Solu-Medrol. She reports no significant change with these measures, remains hemodynamically stable, not in acute distress, but dyspneic with minimal exertion, and will be  observed for ongoing evaluation and management.   Assessment & Plan:   Principal Problem:   Pleural effusion Active Problems:   Hypertension   Anemia in neoplastic disease   Adenocarcinoma of lung, stage 4, left (HCC)   Pelvic mass in female   Malnutrition of moderate degree #1 pleural effusion-patient with history of adenocarcinoma of the left lung stage IV followed by Dr. Julien Nordmann.  Patient also has a pelvic mass followed by GYN oncology and was scheduled for surgery 09/23/2018.  Reviewed GYN oncology notes.    Of the abdomen pelvis shows increased peritoneal carcinomatosis with ascites and omental caking.  Plan is to do biopsy by IR of the omental caking as an outpatient.  She is status post 1 L of blood-tinged fluid removed from the left thorax.  Repeat chest x-ray today shows again that she has large left pleural effusion which has reaccumulated.  Discussed with IR who is planning to do another thoracentesis today as much as she can tolerate.  Patient remains on oxygen.    #2 pelvic mass followed by GYN oncology CT of the abdomen and pelvis as above  #3 hypertension continue Norvasc  #4 depression patient reports that she feels very low and hopeless at times.  I will start her on Lexapro 10 mg daily.  Patient lives alone she does have family in [place]  #5 constipation stool softeners ordered. DVT prophylaxis Lovenox Code Status full code Family Communication none Disposition Plan: Pending clinical improvement Consultants: Interventional radiology   Procedures: Left thoracentesis Antimicrobials: None   Subjective: She is resting in bed in no acute distress denies any complaints of nausea or pain but does report that she is constipated and has not pooped in few days. Objective: Vitals:   09/06/18 0616 09/06/18 1346 09/06/18 2148 09/07/18 0435  BP: Marland Kitchen)  140/48 (!) 126/54 (!) 129/50 (!) 145/54  Pulse: 73 82 72 74  Resp: 16 20 16 16   Temp: 97.9 F (36.6 C) 98 F  (36.7 C) 97.8 F (36.6 C) (!) 97.5 F (36.4 C)  TempSrc: Oral Oral Oral Oral  SpO2: 98% 99% 100% 99%  Weight:      Height:        Intake/Output Summary (Last 24 hours) at 09/07/2018 1059 Last data filed at 09/06/2018 1800 Gross per 24 hour  Intake 360 ml  Output -  Net 360 ml   Filed Weights   09/05/18 0346  Weight: 63.5 kg    Examination:  General exam: Appears calm and comfortable  Respiratory system: Clear to auscultation. Respiratory effort normal. Cardiovascular system: S1 & S2 heard, RRR. No JVD, murmurs, rubs, gallops or clicks. No pedal edema. Gastrointestinal system: Abdomen is nondistended, soft and nontender. No organomegaly or masses felt. Normal bowel sounds heard. Central nervous system: Alert and oriented. No focal neurological deficits. Extremities: Symmetric 5 x 5 power. Skin: No rashes, lesions or ulcers Psychiatry: Judgement and insight appear normal. Mood & affect appropriate.     Data Reviewed: I have personally reviewed following labs and imaging studies  CBC: Recent Labs  Lab 09/04/18 2330 09/05/18 0432 09/06/18 0750  WBC 7.7 5.2 8.9  NEUTROABS 6.4 5.0  --   HGB 10.2* 9.0* 9.1*  HCT 33.5* 30.0* 30.4*  MCV 90.3 90.6 91.3  PLT 166 131* 578*   Basic Metabolic Panel: Recent Labs  Lab 09/04/18 2150 09/05/18 0432 09/06/18 0750  NA 143 144 141  K 4.0 3.6 3.9  CL 107 111 106  CO2 25 24 29   GLUCOSE 101* 229* 80  BUN 17 18 19   CREATININE 0.89 0.84 0.72  CALCIUM 8.9 8.9 8.7*   GFR: Estimated Creatinine Clearance: 53.5 mL/min (by C-G formula based on SCr of 0.72 mg/dL). Liver Function Tests: No results for input(s): AST, ALT, ALKPHOS, BILITOT, PROT, ALBUMIN in the last 168 hours. No results for input(s): LIPASE, AMYLASE in the last 168 hours. No results for input(s): AMMONIA in the last 168 hours. Coagulation Profile: No results for input(s): INR, PROTIME in the last 168 hours. Cardiac Enzymes: No results for input(s): CKTOTAL,  CKMB, CKMBINDEX, TROPONINI in the last 168 hours. BNP (last 3 results) No results for input(s): PROBNP in the last 8760 hours. HbA1C: No results for input(s): HGBA1C in the last 72 hours. CBG: No results for input(s): GLUCAP in the last 168 hours. Lipid Profile: No results for input(s): CHOL, HDL, LDLCALC, TRIG, CHOLHDL, LDLDIRECT in the last 72 hours. Thyroid Function Tests: No results for input(s): TSH, T4TOTAL, FREET4, T3FREE, THYROIDAB in the last 72 hours. Anemia Panel: No results for input(s): VITAMINB12, FOLATE, FERRITIN, TIBC, IRON, RETICCTPCT in the last 72 hours. Sepsis Labs: Recent Labs  Lab 09/04/18 2323  LATICACIDVEN 1.83    Recent Results (from the past 240 hour(s))  Culture, blood (routine x 2)     Status: None (Preliminary result)   Collection Time: 09/04/18 11:30 PM  Result Value Ref Range Status   Specimen Description BLOOD LEFT HAND  Final   Special Requests   Final    BOTTLES DRAWN AEROBIC AND ANAEROBIC Blood Culture adequate volume   Culture   Final    NO GROWTH 2 DAYS Performed at [facility], [place] [address] [place], [place] [address]    Report Status PENDING  Incomplete  Culture, blood (routine x 2)     Status:  None (Preliminary result)   Collection Time: 09/04/18 11:31 PM  Result Value Ref Range Status   Specimen Description BLOOD RIGHT ARM  Final   Special Requests   Final    BOTTLES DRAWN AEROBIC AND ANAEROBIC Blood Culture results may not be optimal due to an excessive volume of blood received in culture bottles   Culture   Final    NO GROWTH 2 DAYS Performed at [facility] [address], [place], [place] [address]    Report Status PENDING  Incomplete  Culture, body fluid-bottle     Status: None (Preliminary result)   Collection Time: 09/05/18 12:41 PM  Result Value Ref Range Status   Specimen Description FLUID PLEURAL LEFT  Final   Special Requests BOTTLES DRAWN AEROBIC AND ANAEROBIC  Final   Culture   Final    NO GROWTH 2  DAYS Performed at [facility], [place] [address] [place], [place] [address]    Report Status PENDING  Incomplete  Gram stain     Status: None   Collection Time: 09/05/18 12:41 PM  Result Value Ref Range Status   Specimen Description FLUID PLEURAL LEFT  Final   Special Requests NONE  Final   Gram Stain   Final    NO WBC SEEN NO ORGANISMS SEEN Performed at [facility], [address] [place], [place] [address]    Report Status 09/06/2018 FINAL  Final         Radiology Studies: Dg Chest 1 View  Result Date: 09/07/2018 CLINICAL DATA:  Shortness of breath.  Lung cancer. EXAM: CHEST  1 VIEW COMPARISON:  One-view chest x-ray 09/05/2018. CT of the chest 09/05/2018. FINDINGS: Large pleural effusion and interstitial disease on the left is unchanged. Right apical consolidation is stable. Aortic atherosclerosis is present. IMPRESSION: 1. Stable appearance of large left pleural effusion with associated interstitial and airspace disease. 2. Stable right apical consolidation. Electronically Signed   By: San Morelle M.D.   On: 09/07/2018 10:43   Dg Chest 1 View  Result Date: 09/05/2018 CLINICAL DATA:  Status post left thoracentesis EXAM: CHEST  1 VIEW COMPARISON:  09/04/2018-chest x-ray 09/05/2018-CT chest FINDINGS: Persistent moderate left pleural effusion. Bilateral reticulonodular interstitial thickening. Left basilar airspace disease. Right apical airspace disease unchanged compared with 09/04/2018. No pneumothorax. Stable cardiomediastinal silhouette. No acute osseous abnormality. IMPRESSION: 1. Moderate left pleural effusion. No pneumothorax status post left thoracentesis. 2. Persistent right upper lobe and left lower lobe airspace disease concerning for pneumonia. 3. Bilateral reticulonodular interstitial thickening unchanged from multiple prior exams. These areas of better characterized on recent CT chest dated 09/05/2018. Electronically Signed   By: Kathreen Devoid   On:  09/05/2018 13:10   Ct Abdomen Pelvis W Contrast  Result Date: 09/06/2018 CLINICAL DATA:  Diffuse abdominal pain. Unintentional weight loss. Metastatic non-small cell lung carcinoma. Ongoing chemotherapy. EXAM: CT ABDOMEN AND PELVIS WITH CONTRAST TECHNIQUE: Multidetector CT imaging of the abdomen and pelvis was performed using the standard protocol following bolus administration of intravenous contrast. CONTRAST:  151mL OMNIPAQUE IOHEXOL 300 MG/ML  SOLN COMPARISON:  07/18/2018 FINDINGS: Lower Chest: Moderate bilateral pleural effusions, increased in size since previous study. Increased left lower lobe atelectasis or collapse. Hepatobiliary: No hepatic masses identified. Hepatic cirrhosis is again demonstrated. Gallbladder is unremarkable. Pancreas:  No mass or inflammatory changes. Spleen: Within normal limits in size and appearance. Adrenals/Urinary Tract: Normal adrenal glands. Stable benign-appearing cyst in lower pole of left kidney. No renal masses identified. No  evidence of ureteral calculi or hydronephrosis. Stomach/Bowel:  Unremarkable. Vascular/Lymphatic: No pathologically enlarged lymph nodes. No abdominal aortic aneurysm. Aortic atherosclerosis. Recanalization of paraumbilical veins again seen, consistent with portal venous hypertension. Reproductive: Central calcified uterine fibroid again seen. Mass in the left adnexa shows mild increase in size, currently measuring 4.1 x 4.0 cm on image 70/2 compared to 3.7 x 2.7 cm previously. New soft tissue mass is seen in the right adnexa measuring 3.3 x 2.1 cm on image 68/2. Mild ascites is increased since previous study. Diffuse soft tissue stranding throughout the omental fat, and peritoneal enhancement in the pelvic cul-de-sac, are both increased and consistent with peritoneal carcinomatosis. Other:  None. Musculoskeletal: Several sclerotic bone lesions in the lower thoracic and lumbar spine are increased in size since previous study, consistent with bone  metastases. IMPRESSION: Increased peritoneal carcinomatosis, with increased bilateral adnexal masses, omental caking, and ascites. Increased size of several sclerotic bone metastases. Increased bilateral pleural effusions and left lower lobe atelectasis or consolidation. Hepatic cirrhosis and portal venous hypertension. Electronically Signed   By: Earle Gell M.D.   On: 09/06/2018 16:54   US Thoracentesis Asp Pleural Space W/img Guide  Result Date: 09/05/2018 INDICATION: Patient with history of lung cancer, respiratory distress, and left pleural effusion. Request is made for diagnostic and therapeutic left thoracentesis. EXAM: ULTRASOUND GUIDED DIAGNOSTIC AND THERAPEUTIC LEFT THORACENTESIS MEDICATIONS: 10 mL of 1% lidocaine COMPLICATIONS: None immediate. PROCEDURE: An ultrasound guided thoracentesis was thoroughly discussed with the patient and questions answered. The benefits, risks, alternatives and complications were also discussed. The patient understands and wishes to proceed with the procedure. Written consent was obtained. Ultrasound was performed to localize and mark an adequate pocket of fluid in the left chest. The area was then prepped and draped in the normal sterile fashion. 1% Lidocaine was used for local anesthesia. Under ultrasound guidance a 6 Fr Safe-T-Centesis catheter was introduced. Thoracentesis was performed. The catheter was removed and a dressing applied. FINDINGS: A total of approximately 1 L of blood-tinged fluid was removed. Samples were sent to the laboratory as requested by the clinical team. IMPRESSION: Successful ultrasound guided left thoracentesis yielding 1 L of pleural fluid. Read by: Earley Abide, PA-C Electronically Signed   By: Marybelle Killings M.D.   On: 09/05/2018 13:21        Scheduled Meds: . amLODipine  2.5 mg Oral Daily  . cholecalciferol  1,000 Units Oral Daily  . docusate sodium  100 mg Oral Daily  . enoxaparin (LOVENOX) injection  40 mg Subcutaneous Q24H    . escitalopram  10 mg Oral Daily  . feeding supplement (ENSURE ENLIVE)  237 mL Oral BID BM  . mirtazapine  30 mg Oral QHS  . osimertinib mesylate  80 mg Oral Daily  . predniSONE  5 mg Oral Q breakfast  . vitamin B-12  100 mcg Oral Daily   Continuous Infusions:   LOS: 2 days   Georgette Shell, MD [facility]  If 7PM-7AM, please contact night-coverage [email or web address] Password [email or web address] 09/07/2018, 10:59 AM

## 2018-09-08 ENCOUNTER — Inpatient Hospital Stay (HOSPITAL_COMMUNITY): Payer: Medicare Other

## 2018-09-08 MED ORDER — LIDOCAINE HCL 1 % IJ SOLN
INTRAMUSCULAR | Status: AC
  Filled 2018-09-08: qty 10

## 2018-09-08 NOTE — Procedures (Signed)
PROCEDURE SUMMARY:  Successful US guided therapeutic left thoracentesis. Yielded 600 mL of amber fluid. Pt tolerated procedure well. No immediate complications.  Specimen was not sent for labs. CXR ordered.  Docia Barrier PA-C 09/08/2018 9:23 AM

## 2018-09-09 ENCOUNTER — Encounter: Payer: Self-pay | Admitting: *Deleted

## 2018-09-09 ENCOUNTER — Ambulatory Visit (HOSPITAL_COMMUNITY): Admission: RE | Admit: 2018-09-09 | Payer: Medicare Other | Source: Ambulatory Visit | Admitting: Obstetrics

## 2018-09-09 ENCOUNTER — Encounter (HOSPITAL_COMMUNITY): Admission: RE | Payer: Self-pay | Source: Ambulatory Visit

## 2018-09-09 ENCOUNTER — Other Ambulatory Visit: Payer: Self-pay | Admitting: Obstetrics

## 2018-09-09 DIAGNOSIS — C800 Disseminated malignant neoplasm, unspecified: Secondary | ICD-10-CM

## 2018-09-09 SURGERY — SALPINGO-OOPHORECTOMY, BILATERAL, ROBOT-ASSISTED
Anesthesia: General | Laterality: Bilateral

## 2018-09-10 LAB — CULTURE, BLOOD (ROUTINE X 2)
CULTURE: NO GROWTH
CULTURE: NO GROWTH
Special Requests: ADEQUATE

## 2018-09-10 LAB — CULTURE, BODY FLUID W GRAM STAIN -BOTTLE: Culture: NO GROWTH

## 2018-09-10 LAB — CULTURE, BODY FLUID-BOTTLE

## 2018-09-10 NOTE — Addendum Note (Signed)
Addended by: Precious Haws B on: 09/10/2018 08:55 AM   Modules accepted: Orders

## 2018-09-17 ENCOUNTER — Other Ambulatory Visit (HOSPITAL_COMMUNITY): Payer: Medicare Other

## 2018-09-20 ENCOUNTER — Encounter: Payer: Self-pay | Admitting: Family Medicine

## 2018-09-20 ENCOUNTER — Telehealth: Payer: Self-pay | Admitting: *Deleted

## 2018-09-20 ENCOUNTER — Ambulatory Visit (INDEPENDENT_AMBULATORY_CARE_PROVIDER_SITE_OTHER): Payer: Medicare Other

## 2018-09-20 ENCOUNTER — Other Ambulatory Visit: Payer: Self-pay

## 2018-09-20 ENCOUNTER — Ambulatory Visit (INDEPENDENT_AMBULATORY_CARE_PROVIDER_SITE_OTHER): Payer: Medicare Other | Admitting: Family Medicine

## 2018-09-20 VITALS — BP 99/62 | HR 118 | Temp 98.7°F | Resp 16 | Ht 61.42 in | Wt 132.0 lb

## 2018-09-20 DIAGNOSIS — R0689 Other abnormalities of breathing: Secondary | ICD-10-CM | POA: Diagnosis not present

## 2018-09-20 DIAGNOSIS — I10 Essential (primary) hypertension: Secondary | ICD-10-CM | POA: Diagnosis not present

## 2018-09-20 DIAGNOSIS — R0609 Other forms of dyspnea: Secondary | ICD-10-CM

## 2018-09-20 DIAGNOSIS — R1900 Intra-abdominal and pelvic swelling, mass and lump, unspecified site: Secondary | ICD-10-CM

## 2018-09-20 DIAGNOSIS — C3492 Malignant neoplasm of unspecified part of left bronchus or lung: Secondary | ICD-10-CM | POA: Diagnosis not present

## 2018-09-20 DIAGNOSIS — J90 Pleural effusion, not elsewhere classified: Secondary | ICD-10-CM

## 2018-09-20 NOTE — Telephone Encounter (Signed)
Patient's daughter called and scheduled a follow appt for November 1st

## 2018-09-20 NOTE — Patient Instructions (Signed)
° ° ° °  If you have lab work done today you will be contacted with your lab results within the next 2 weeks.  If you have not heard from us then please contact us. The fastest way to get your results is to register for My Chart. ° ° °IF you received an x-ray today, you will receive an invoice from [facility]. Please contact [facility] at [phone] with questions or concerns regarding your invoice.  ° °IF you received labwork today, you will receive an invoice from [facility]. Please contact [facility] at [phone] with questions or concerns regarding your invoice.  ° °Our billing staff will not be able to assist you with questions regarding bills from these companies. ° °You will be contacted with the lab results as soon as they are available. The fastest way to get your results is to activate your My Chart account. Instructions are located on the last page of this paperwork. If you have not heard from us regarding the results in 2 weeks, please contact this office. °  ° ° ° °

## 2018-09-20 NOTE — Progress Notes (Signed)
10/25/20192:07 PM  Jacqueline Leonard [birth year], 72 y.o. female [id number]  Chief Complaint  Patient presents with  . Hospitalization Follow-up    pt was seen in the ER on 10/9     HPI:   Patient is a 72 y.o. female with past medical history significant for metastatic lung cancer on Tagrisso and Xgeva with new adnexal mass who presents today for hospital followup and preop clearance  Recently admitted for 5 days for SOB found to have a large pleural effusion, IR thoracentesis, 600cc, cytology = metastatic adenocarcinoma  cough improved, cont to be dry Still having DOE within her house Feeling weak, tired Not exercising No fever or chills No orthopnea, no PND No leg edema No chest pain, no palpitations Mild intermittent nausea, no vomiting, no abd pain No diarrhea No dysuria or hematuria No dizziness or focal weakness Sleeping well  Has not been taking amlodpine She is taking prednisone   Senokot did not help for constipation She does drink a tea that works well for her  She will be discussing possible surgery for her adnexal mass when she sees gyn on nov 1st  RCRI: Surgery risk: intermediate, robotic laparoscopy with possible exploratory laparotomy with staging H/o ischemic heart disease: no H/o of heart failure: no H/o CVA: no DM on insulin: no Preoperative creatinine > 2mg /dl: no Mets: < 4  Lab Results  Component Value Date   CREATININE 0.72 09/06/2018   Lab Results  Component Value Date   NA 141 09/06/2018   K 3.9 09/06/2018   CL 106 09/06/2018   CO2 29 09/06/2018   Lab Results  Component Value Date   WBC 8.9 09/06/2018   HGB 9.1 (L) 09/06/2018   HCT 30.4 (L) 09/06/2018   MCV 91.3 09/06/2018   PLT 121 (L) 09/06/2018   Echo done oct 2019: LVEF 55-60% no wall motion abnormalities. No sign valvular disease, mild elevation of PA pressure at 34 mmHG  EKG done 09/04/2018 - sinus tach  Fall Risk  08/21/2018 05/14/2018 04/30/2018 03/07/2018 03/07/2018    Falls in the past year? No No No No No     Depression screen [facility] 2/9 08/21/2018 05/14/2018 04/30/2018  Decreased Interest 0 0 0  Down, Depressed, Hopeless 0 0 0  PHQ - 2 Score 0 0 0  Some recent data might be hidden    Allergies  Allergen Reactions  . Penicillins Palpitations and Other (See Comments)    Has patient had a PCN reaction causing immediate rash, facial/tongue/throat swelling, SOB or lightheadedness with hypotension: No Has patient had a PCN reaction causing severe rash involving mucus membranes or skin necrosis: No Has patient had a PCN reaction that required hospitalization No Has patient had a PCN reaction occurring within the last 10 years: Yes If all of the above answers are "NO", then may proceed with Cephalosporin use.    Prior to Admission medications   Medication Sig Start Date End Date Taking? Authorizing Provider  amLODipine (NORVASC) 2.5 MG tablet TAKE 1 TABLET(2.5 MG) BY MOUTH DAILY Patient taking differently: Take 2.5 mg by mouth daily.  09/03/18   Rutherford Guys, MD  cholecalciferol (VITAMIN D) 1000 UNITS tablet Take 1,000 Units by mouth daily.    [provider]  docusate sodium (COLACE) 100 MG capsule Take 1 capsule (100 mg total) by mouth daily. 09/08/18   Georgette Shell, MD  escitalopram (LEXAPRO) 10 MG tablet Take 1 tablet (10 mg total) by mouth daily. 09/08/18   Georgette Shell,  MD  mirtazapine (REMERON) 30 MG tablet Take 1 tablet (30 mg total) by mouth at bedtime. 08/21/18   Rutherford Guys, MD  ondansetron (ZOFRAN) 4 MG tablet Take 1 tablet (4 mg total) by mouth every 6 (six) hours as needed for nausea. 09/07/18   Georgette Shell, MD  osimertinib mesylate (TAGRISSO) 80 MG tablet Take 1 tablet (80 mg total) by mouth daily. 08/12/18   Maryanna Shape, NP  Phenyleph-Doxylamine-DM-APAP (NYQUIL SEVERE COLD/FLU PO) Take 5 mLs by mouth daily as needed (cough). Once at bedtime     [provider]  predniSONE (DELTASONE) 5 MG  tablet Take 1 tablet (5 mg total) by mouth daily with breakfast. 09/08/18   Georgette Shell, MD  senna-docusate (SENOKOT-S) 8.6-50 MG tablet Take 1 tablet by mouth at bedtime as needed for mild constipation. 09/07/18   Georgette Shell, MD  vitamin B-12 (CYANOCOBALAMIN) 100 MCG tablet Take 1 tablet by mouth daily.    [provider]    Past Medical History:  Diagnosis Date  . Allergy   . Anxiety   . External hemorrhoids   . Family history of breast cancer   . Family history of lung cancer   . Hyperlipidemia   . Hypertension   . Lung cancer metastatic to bone ([place]) 06/19/2012   bx=L 5th ribmetastatic Adenocarcinoma with known lung mass  . Radiation 07/11/12-07/24/12   Palliative lung tx 30 gray in 10 fx  . S/P radiation therapy 07/22/14-07/31/14   left lung/60gy/60fx  . Status post chemoradiation    Tarceva  . Vitamin D deficiency     Past Surgical History:  Procedure Laterality Date  . APPENDECTOMY     done at time of ovarian surgery, not ruptured.  . OVARIAN CYST REMOVAL     twice - 1970's and 1980's  . SOFT TISSUE BIOPSY  06/19/12   L 5th rib=metastatic adenocarcinoma    Social History   Tobacco Use  . Smoking status: Never Smoker  . Smokeless tobacco: Never Used  Substance Use Topics  . Alcohol use: No    Family History  Problem Relation Age of Onset  . Lung cancer Brother   . Breast cancer Sister        dx early 15s, d 84  . Breast cancer Sister 68       d 67s    ROS Per hpi  OBJECTIVE:  Blood pressure 99/62, pulse (!) 118, temperature 98.7 F (37.1 C), temperature source Oral, resp. rate 16, height 5' 1.42" (1.56 m), weight 132 lb (59.9 kg), SpO2 95 %. Body mass index is 24.6 kg/m.   Wt Readings from Last 3 Encounters:  09/20/18 132 lb (59.9 kg)  09/05/18 140 lb (63.5 kg)  09/02/18 138 lb (62.6 kg)    BP Readings from Last 3 Encounters:  09/20/18 99/62  09/08/18 (!) 119/45  09/02/18 (!) 143/50    Physical Exam    Constitutional: She is oriented to person, place, and time. She appears well-developed and well-nourished.  HENT:  Head: Normocephalic and atraumatic.  Mouth/Throat: Oropharynx is clear and moist. No oropharyngeal exudate.  Eyes: Pupils are equal, round, and reactive to light. Conjunctivae and EOM are normal. No scleral icterus.  Neck: Neck supple.  Cardiovascular: Normal rate, regular rhythm and normal heart sounds. Exam reveals no gallop and no friction rub.  No murmur heard. Pulmonary/Chest: Effort normal. She has decreased breath sounds in the left lower field. She has no wheezes. She has no rhonchi. She  has no rales.  Abdominal: Soft. Bowel sounds are normal. There is no hepatosplenomegaly. There is tenderness in the right upper quadrant and epigastric area. There is no rebound, no guarding and negative Murphy's sign.  Musculoskeletal: She exhibits no edema.  Neurological: She is alert and oriented to person, place, and time.  Skin: Skin is warm and dry.  Psychiatric: She has a normal mood and affect.  Nursing note and vitals reviewed.    Dg Chest 2 View  Result Date: 09/20/2018 CLINICAL DATA:  72 year old female with a history of metastatic lung cancer and malignant effusion EXAM: CHEST - 2 VIEW COMPARISON:  09/08/2018, 09/07/2018, CT 09/05/2018 FINDINGS: Cardiomediastinal silhouette likely unchanged partially obscured by overlying lung and pleural disease. Persisting opacity at the left base obscuring the left hemidiaphragm, left heart border, and pleuroparenchymal thickening. Platelike opacity of the right upper lung with bilateral patchy opacities of the lungs. Lateral view demonstrates meniscus. No displaced fracture. Redemonstration of expansive left third rib lesion, better seen on prior CT. IMPRESSION: Chest x-ray is unchanged from the comparison of 09/08/2018, with left-sided pleural effusion, partially loculated. Similar appearance of linear opacity at the right lung apex as well  as multifocal interstitial/airspace opacities, with differential again including possible combination of chronic scarring, multifocal infection, and/or metastatic disease. Electronically Signed   By: Corrie Mckusick D.O.   On: 09/20/2018 15:30    ASSESSMENT and PLAN  1. Adenocarcinoma of left lung, stage 4 ([place]) 2. DOE (dyspnea on exertion) 5. Pleural effusion 6. Pelvic mass in female Patient with continued malignant effusion, symptomatic. Referring to pulm for preop pulm clearance and management of pleural effusion. Medically she is optimized and no further cardiac testing is needed. Per ACS NSQIP her risk of serious complications/death is higher than average, risk increases if laparotomy needed. She will be meeting soon with gyn onc about possible surgery for adnexal mass. I strongly encouraged patient to discuss benefits vs risk of surgery in setting of metastatic lung cancer with surgeon and her primary oncologist. - DG Chest 2 View; Future - Ambulatory referral to Pulmonology  3. Abnormal breath sounds - DG Chest 2 View; Future  4. Essential hypertension Mild hypotensive, dc amlodipine  Return in about 4 weeks (around 10/18/2018) for f/u after specialists.    Rutherford Guys, MD [facility], [place] [id number] Ph.  [phone] Fax [phone]

## 2018-09-21 ENCOUNTER — Encounter: Payer: Self-pay | Admitting: Family Medicine

## 2018-09-23 ENCOUNTER — Encounter: Payer: Self-pay | Admitting: Internal Medicine

## 2018-09-23 ENCOUNTER — Inpatient Hospital Stay: Payer: Medicare Other

## 2018-09-23 ENCOUNTER — Inpatient Hospital Stay (HOSPITAL_BASED_OUTPATIENT_CLINIC_OR_DEPARTMENT_OTHER): Payer: Medicare Other | Admitting: Internal Medicine

## 2018-09-23 VITALS — BP 150/54 | HR 89 | Temp 97.8°F | Resp 18 | Ht 61.0 in | Wt 134.0 lb

## 2018-09-23 DIAGNOSIS — Z9221 Personal history of antineoplastic chemotherapy: Secondary | ICD-10-CM

## 2018-09-23 DIAGNOSIS — C7952 Secondary malignant neoplasm of bone marrow: Principal | ICD-10-CM

## 2018-09-23 DIAGNOSIS — Z5111 Encounter for antineoplastic chemotherapy: Secondary | ICD-10-CM

## 2018-09-23 DIAGNOSIS — C3492 Malignant neoplasm of unspecified part of left bronchus or lung: Secondary | ICD-10-CM

## 2018-09-23 DIAGNOSIS — C786 Secondary malignant neoplasm of retroperitoneum and peritoneum: Secondary | ICD-10-CM

## 2018-09-23 DIAGNOSIS — Z79899 Other long term (current) drug therapy: Secondary | ICD-10-CM | POA: Diagnosis not present

## 2018-09-23 DIAGNOSIS — Z7189 Other specified counseling: Secondary | ICD-10-CM

## 2018-09-23 DIAGNOSIS — C7951 Secondary malignant neoplasm of bone: Secondary | ICD-10-CM

## 2018-09-23 DIAGNOSIS — Z9223 Personal history of estrogen therapy: Secondary | ICD-10-CM | POA: Diagnosis not present

## 2018-09-23 DIAGNOSIS — D398 Neoplasm of uncertain behavior of other specified female genital organs: Secondary | ICD-10-CM | POA: Insufficient documentation

## 2018-09-23 LAB — CBC WITH DIFFERENTIAL (CANCER CENTER ONLY)
ABS IMMATURE GRANULOCYTES: 0.03 10*3/uL (ref 0.00–0.07)
BASOS PCT: 0 %
Basophils Absolute: 0 10*3/uL (ref 0.0–0.1)
Eosinophils Absolute: 0 10*3/uL (ref 0.0–0.5)
Eosinophils Relative: 0 %
HCT: 33.1 % — ABNORMAL LOW (ref 36.0–46.0)
HEMOGLOBIN: 10.1 g/dL — AB (ref 12.0–15.0)
IMMATURE GRANULOCYTES: 0 %
LYMPHS PCT: 4 %
Lymphs Abs: 0.3 10*3/uL — ABNORMAL LOW (ref 0.7–4.0)
MCH: 27.7 pg (ref 26.0–34.0)
MCHC: 30.5 g/dL (ref 30.0–36.0)
MCV: 90.7 fL (ref 80.0–100.0)
MONO ABS: 0.4 10*3/uL (ref 0.1–1.0)
MONOS PCT: 5 %
NEUTROS ABS: 7.6 10*3/uL (ref 1.7–7.7)
Neutrophils Relative %: 91 %
PLATELETS: 144 10*3/uL — AB (ref 150–400)
RBC: 3.65 MIL/uL — ABNORMAL LOW (ref 3.87–5.11)
RDW: 16.5 % — ABNORMAL HIGH (ref 11.5–15.5)
WBC Count: 8.5 10*3/uL (ref 4.0–10.5)
nRBC: 0 % (ref 0.0–0.2)

## 2018-09-23 LAB — CMP (CANCER CENTER ONLY)
ALK PHOS: 125 U/L (ref 38–126)
ALT: 19 U/L (ref 0–44)
AST: 24 U/L (ref 15–41)
Albumin: 2.9 g/dL — ABNORMAL LOW (ref 3.5–5.0)
Anion gap: 8 (ref 5–15)
BUN: 18 mg/dL (ref 8–23)
CALCIUM: 9.3 mg/dL (ref 8.9–10.3)
CHLORIDE: 107 mmol/L (ref 98–111)
CO2: 28 mmol/L (ref 22–32)
CREATININE: 1.06 mg/dL — AB (ref 0.44–1.00)
GFR, EST AFRICAN AMERICAN: 59 mL/min — AB (ref >60)
GFR, Estimated: 51 mL/min — ABNORMAL LOW (ref >60)
Glucose, Bld: 132 mg/dL — ABNORMAL HIGH (ref 70–99)
Potassium: 4.1 mmol/L (ref 3.5–5.1)
SODIUM: 143 mmol/L (ref 135–145)
Total Bilirubin: 0.6 mg/dL (ref 0.3–1.2)
Total Protein: 6.7 g/dL (ref 6.5–8.1)

## 2018-09-23 NOTE — Progress Notes (Signed)
[place] [phone]   [phone]) [phone]  OFFICE PROGRESS NOTE  Rutherford Guys, MD [address] [place] [address]  DIAGNOSIS: Metastatic non-small cell lung cancer, adenocarcinoma with positive EGFR mutation in exon 19 and development of resistant T790M mutation. This was initially diagnosed in July 2013.  PRIOR THERAPY: 1) Status post radiotherapy to the left face rib metastasis under the care of Dr. Lisbeth Renshaw.  2) stereotactic radiotherapy to the enlarging left lower lobe lung nodule under the care of Dr. Lisbeth Renshaw. 3) treatment with Tarceva 150 mg by mouth daily, therapy beginning 07/20/2012. Status post approximately 36 months of therapy. This was discontinued secondary to disease progression. 4) stereotactic radiotherapy to enlarging pulmonary nodules under the care of Dr. Lisbeth Renshaw.  CURRENT THERAPY: 1) Tagrisso 80 mg by mouth daily started 02/03/2016 status post more than 29 months of treatment. 2) Xgeva 120 mg subcutaneously every 4 weeks for bone metastasis.  INTERVAL HISTORY: Jacqueline Leonard 72 y.o. female returns to the clinic today for follow-up visit accompanied by her sister.  The patient is feeling fine today with no concerning complaints except for weight loss and increasing fatigue.  She denied having any current chest pain but has shortness of breath with exertion with mild cough and no hemoptysis.  She denied having any nausea, vomiting, abdominal pain, diarrhea or constipation.  She has no fever or chills.  She denied having any headache or visual changes.  She recently underwent ultrasound-guided paracentesis for accumulation of ascites and the final cytology was consistent with adenocarcinoma of lung primary.  She had recent imaging studies including CT scan of the chest as well as CT scan of the abdomen and pelvis that showed concerning findings for disease progression.  The patient is here today for evaluation and discussion of her treatment  options.  MEDICAL HISTORY: Past Medical History:  Diagnosis Date  . Allergy   . Anxiety   . External hemorrhoids   . Family history of breast cancer   . Family history of lung cancer   . Hyperlipidemia   . Hypertension   . Lung cancer metastatic to bone ([facility]) 06/19/2012   bx=L 5th ribmetastatic Adenocarcinoma with known lung mass  . Radiation 07/11/12-07/24/12   Palliative lung tx 30 gray in 10 fx  . S/P radiation therapy 07/22/14-07/31/14   left lung/60gy/23f  . Status post chemoradiation    Tarceva  . Vitamin D deficiency     ALLERGIES:  is allergic to penicillins.  MEDICATIONS:  Current Outpatient Medications  Medication Sig Dispense Refill  . cholecalciferol (VITAMIN D) 1000 UNITS tablet Take 1,000 Units by mouth daily.    .Marland Kitchenescitalopram (LEXAPRO) 10 MG tablet Take 1 tablet (10 mg total) by mouth daily. 30 tablet 0  . mirtazapine (REMERON) 30 MG tablet Take 1 tablet (30 mg total) by mouth at bedtime. 30 tablet 2  . ondansetron (ZOFRAN) 4 MG tablet Take 1 tablet (4 mg total) by mouth every 6 (six) hours as needed for nausea. 20 tablet 0  . osimertinib mesylate (TAGRISSO) 80 MG tablet Take 1 tablet (80 mg total) by mouth daily. 30 tablet 2  . Phenyleph-Doxylamine-DM-APAP (NYQUIL SEVERE COLD/FLU PO) Take 5 mLs by mouth daily as needed (cough). Once at bedtime     . predniSONE (DELTASONE) 5 MG tablet Take 1 tablet (5 mg total) by mouth daily with breakfast. 20 tablet 0  . vitamin B-12 (CYANOCOBALAMIN) 100 MCG tablet Take 1 tablet by mouth daily.  No current facility-administered medications for this visit.     SURGICAL HISTORY:  Past Surgical History:  Procedure Laterality Date  . APPENDECTOMY     done at time of ovarian surgery, not ruptured.  . OVARIAN CYST REMOVAL     twice - 1970's and 1980's  . SOFT TISSUE BIOPSY  06/19/12   L 5th rib=metastatic adenocarcinoma    REVIEW OF SYSTEMS:  Constitutional: positive for fatigue and weight loss Eyes: negative Ears, nose,  mouth, throat, and face: negative Respiratory: positive for cough and dyspnea on exertion Cardiovascular: negative Gastrointestinal: negative Genitourinary:negative Integument/breast: negative Hematologic/lymphatic: negative Musculoskeletal:positive for muscle weakness Neurological: negative Behavioral/Psych: negative Endocrine: negative Allergic/Immunologic: negative   PHYSICAL EXAMINATION: General appearance: alert, cooperative, fatigued and no distress Head: Normocephalic, without obvious abnormality, atraumatic Neck: no adenopathy, no JVD, supple, symmetrical, trachea midline and thyroid not enlarged, symmetric, no tenderness/mass/nodules Lymph nodes: Cervical, supraclavicular, and axillary nodes normal. Resp: clear to auscultation bilaterally Back: symmetric, no curvature. ROM normal. No CVA tenderness. Cardio: regular rate and rhythm, S1, S2 normal, no murmur, click, rub or gallop GI: soft, non-tender; bowel sounds normal; no masses,  no organomegaly Extremities: extremities normal, atraumatic, no cyanosis or edema Neurologic: Alert and oriented X 3, normal strength and tone. Normal symmetric reflexes. Normal coordination and gait  ECOG PERFORMANCE STATUS: 1 - Symptomatic but completely ambulatory  Blood pressure (!) 150/54, pulse 89, temperature 97.8 F (36.6 C), temperature source Oral, resp. rate 18, height 5' 1"  (1.549 m), weight 134 lb (60.8 kg), SpO2 99 %.  LABORATORY DATA: Lab Results  Component Value Date   WBC 8.5 09/23/2018   HGB 10.1 (L) 09/23/2018   HCT 33.1 (L) 09/23/2018   MCV 90.7 09/23/2018   PLT 144 (L) 09/23/2018      Chemistry      Component Value Date/Time   NA 141 09/06/2018 0750   NA 140 11/01/2017 1423   K 3.9 09/06/2018 0750   K 3.7 11/01/2017 1423   CL 106 09/06/2018 0750   CL 102 05/06/2013 1511   CO2 29 09/06/2018 0750   CO2 25 11/01/2017 1423   BUN 19 09/06/2018 0750   BUN 21.2 11/01/2017 1423   CREATININE 0.72 09/06/2018 0750    CREATININE 0.85 07/15/2018 1522   CREATININE 1.0 11/01/2017 1423      Component Value Date/Time   CALCIUM 8.7 (L) 09/06/2018 0750   CALCIUM 9.3 11/01/2017 1423   ALKPHOS 101 07/15/2018 1522   ALKPHOS 81 11/01/2017 1423   AST 27 07/15/2018 1522   AST 30 11/01/2017 1423   ALT 31 07/15/2018 1522   ALT 21 11/01/2017 1423   BILITOT 0.4 07/15/2018 1522   BILITOT 0.45 11/01/2017 1423       RADIOGRAPHIC STUDIES: Dg Chest 1 View  Result Date: 09/08/2018 CLINICAL DATA:  Status post LEFT thoracentesis. EXAM: CHEST  1 VIEW COMPARISON:  Chest x-ray dated 09/07/2018. FINDINGS: Persistent dense opacity at the LEFT lung base. Slightly improved aeration along the lateral aspects of the LEFT lung status post thoracentesis. No pneumothorax seen. Heart size and mediastinal contours appear stable. Dense opacity at the RIGHT lung apex is stable. There are patchy opacities within the mid and lower RIGHT lung, possibly atelectasis or edema. No acute or suspicious osseous finding. IMPRESSION: 1. Slightly improved aeration along the lateral aspects of the LEFT lung status post thoracentesis. No pneumothorax seen. 2. Persistent dense opacity at the LEFT lung base, presumably pneumonia or airspace collapse given the recent thoracentesis. 3. Persistent dense opacity  at the RIGHT lung apex, corresponding to the consolidation better demonstrated on recent chest CT of 09/05/2018. 4. Additional patchy opacities within the RIGHT mid and lower lung correspond to additional smaller consolidations and interstitial thickening better demonstrated on recent chest CT, possibly atelectasis or edema, possibly lymphangitic metastasis as suggested on chest CT report. Electronically Signed   By: Franki Cabot M.D.   On: 09/08/2018 10:11   Dg Chest 1 View  Result Date: 09/07/2018 CLINICAL DATA:  Shortness of breath.  Lung cancer. EXAM: CHEST  1 VIEW COMPARISON:  One-view chest x-ray 09/05/2018. CT of the chest 09/05/2018. FINDINGS:  Large pleural effusion and interstitial disease on the left is unchanged. Right apical consolidation is stable. Aortic atherosclerosis is present. IMPRESSION: 1. Stable appearance of large left pleural effusion with associated interstitial and airspace disease. 2. Stable right apical consolidation. Electronically Signed   By: San Morelle M.D.   On: 09/07/2018 10:43   Dg Chest 1 View  Result Date: 09/05/2018 CLINICAL DATA:  Status post left thoracentesis EXAM: CHEST  1 VIEW COMPARISON:  09/04/2018-chest x-ray 09/05/2018-CT chest FINDINGS: Persistent moderate left pleural effusion. Bilateral reticulonodular interstitial thickening. Left basilar airspace disease. Right apical airspace disease unchanged compared with 09/04/2018. No pneumothorax. Stable cardiomediastinal silhouette. No acute osseous abnormality. IMPRESSION: 1. Moderate left pleural effusion. No pneumothorax status post left thoracentesis. 2. Persistent right upper lobe and left lower lobe airspace disease concerning for pneumonia. 3. Bilateral reticulonodular interstitial thickening unchanged from multiple prior exams. These areas of better characterized on recent CT chest dated 09/05/2018. Electronically Signed   By: Kathreen Devoid   On: 09/05/2018 13:10   Dg Chest 2 View  Result Date: 09/20/2018 CLINICAL DATA:  72 year old female with a history of metastatic lung cancer and malignant effusion EXAM: CHEST - 2 VIEW COMPARISON:  09/08/2018, 09/07/2018, CT 09/05/2018 FINDINGS: Cardiomediastinal silhouette likely unchanged partially obscured by overlying lung and pleural disease. Persisting opacity at the left base obscuring the left hemidiaphragm, left heart border, and pleuroparenchymal thickening. Platelike opacity of the right upper lung with bilateral patchy opacities of the lungs. Lateral view demonstrates meniscus. No displaced fracture. Redemonstration of expansive left third rib lesion, better seen on prior CT. IMPRESSION: Chest  x-ray is unchanged from the comparison of 09/08/2018, with left-sided pleural effusion, partially loculated. Similar appearance of linear opacity at the right lung apex as well as multifocal interstitial/airspace opacities, with differential again including possible combination of chronic scarring, multifocal infection, and/or metastatic disease. Electronically Signed   By: Corrie Mckusick D.O.   On: 09/20/2018 15:30   Ct Angio Chest Pe W And/or Wo Contrast  Result Date: 09/05/2018 CLINICAL DATA:  Worsening cough for 5 months. Shortness of breath. History of lung cancer with metastasis. EXAM: CT ANGIOGRAPHY CHEST WITH CONTRAST TECHNIQUE: Multidetector CT imaging of the chest was performed using the standard protocol during bolus administration of intravenous contrast. Multiplanar CT image reconstructions and MIPs were obtained to evaluate the vascular anatomy. CONTRAST:  107m ISOVUE-370 IOPAMIDOL (ISOVUE-370) INJECTION 76% COMPARISON:  07/18/2018 FINDINGS: Cardiovascular: Moderately good opacification of the central and segmental pulmonary arteries. No focal filling defects. No evidence of significant pulmonary embolus. Normal heart size. No pericardial effusion. Normal caliber thoracic aorta. No aortic dissection. Great vessel origins are patent. Calcification of the aorta. Mediastinum/Nodes: Mildly distended esophagus with thickened lower esophageal wall likely representing reflux disease or esophagitis. No significant lymphadenopathy in the chest. Lungs/Pleura: Motion artifact limits examination. There is a large right pleural effusion, increasing since previous study. There  is consolidation with air bronchograms in the right upper and lower lung with complete consolidation and collapse of the left lower lung and patchy infiltrates throughout the remainder of the lungs. Interstitial and parenchymal nodularity suggesting lymphangitic metastasis. Left lower lobe bronchus is narrowed and constricted, likely  indicating tumor involvement. No pneumothorax. Upper Abdomen: Upper abdominal ascites. Musculoskeletal: Heterogeneous mixed lucent and sclerotic appearance of vertebrae and ribs consistent with bone metastasis. Expansile rib lesions are present. Vertebral hemangioma. Review of the MIP images confirms the above findings. IMPRESSION: 1. No evidence of significant pulmonary embolus. 2. Large right pleural effusion, increasing since previous study. 3. Progressing consolidation with air bronchograms in the right upper and lower lung with complete consolidation and collapse of the left lower lung and patchy infiltrates throughout the remainder of the lungs. Interstitial and parenchymal nodularity suggesting lymphangitic metastasis. 4. Diffuse bone metastasis. Aortic Atherosclerosis (ICD10-I70.0). Electronically Signed   By: Lucienne Capers M.D.   On: 09/05/2018 00:27   Ct Abdomen Pelvis W Contrast  Result Date: 09/06/2018 CLINICAL DATA:  Diffuse abdominal pain. Unintentional weight loss. Metastatic non-small cell lung carcinoma. Ongoing chemotherapy. EXAM: CT ABDOMEN AND PELVIS WITH CONTRAST TECHNIQUE: Multidetector CT imaging of the abdomen and pelvis was performed using the standard protocol following bolus administration of intravenous contrast. CONTRAST:  182m OMNIPAQUE IOHEXOL 300 MG/ML  SOLN COMPARISON:  07/18/2018 FINDINGS: Lower Chest: Moderate bilateral pleural effusions, increased in size since previous study. Increased left lower lobe atelectasis or collapse. Hepatobiliary: No hepatic masses identified. Hepatic cirrhosis is again demonstrated. Gallbladder is unremarkable. Pancreas:  No mass or inflammatory changes. Spleen: Within normal limits in size and appearance. Adrenals/Urinary Tract: Normal adrenal glands. Stable benign-appearing cyst in lower pole of left kidney. No renal masses identified. No evidence of ureteral calculi or hydronephrosis. Stomach/Bowel:  Unremarkable. Vascular/Lymphatic: No  pathologically enlarged lymph nodes. No abdominal aortic aneurysm. Aortic atherosclerosis. Recanalization of paraumbilical veins again seen, consistent with portal venous hypertension. Reproductive: Central calcified uterine fibroid again seen. Mass in the left adnexa shows mild increase in size, currently measuring 4.1 x 4.0 cm on image 70/2 compared to 3.7 x 2.7 cm previously. New soft tissue mass is seen in the right adnexa measuring 3.3 x 2.1 cm on image 68/2. Mild ascites is increased since previous study. Diffuse soft tissue stranding throughout the omental fat, and peritoneal enhancement in the pelvic cul-de-sac, are both increased and consistent with peritoneal carcinomatosis. Other:  None. Musculoskeletal: Several sclerotic bone lesions in the lower thoracic and lumbar spine are increased in size since previous study, consistent with bone metastases. IMPRESSION: Increased peritoneal carcinomatosis, with increased bilateral adnexal masses, omental caking, and ascites. Increased size of several sclerotic bone metastases. Increased bilateral pleural effusions and left lower lobe atelectasis or consolidation. Hepatic cirrhosis and portal venous hypertension. Electronically Signed   By: JEarle GellM.D.   On: 09/06/2018 16:54   Dg Chest Port 1 View  Result Date: 09/04/2018 CLINICAL DATA:  Cough for 5 months, getting worse. History of hypertension. Nonsmoker. EXAM: PORTABLE CHEST 1 VIEW COMPARISON:  CT chest 07/18/2018 FINDINGS: Consolidative opacities in the right upper lung and left lower lung. Probable left pleural effusion. These changes were seen on the previous CT. Changes may be related to radiation therapy or superimposed pneumonia. Follow-up after resolution of acute process is recommended to exclude underlying obstructive lesion. Heart size is somewhat obscured by the parenchymal process but appears normal. No vascular congestion. No pneumothorax. IMPRESSION: Consolidative opacities in the right  upper lung and left  lower lung with left pleural effusion. Changes may be related to radiation therapy or superimposed pneumonia. Followup PA and lateral chest X-ray is recommended in 3-4 weeks to ensure resolution and exclude underlying obstructing lesion. Electronically Signed   By: Lucienne Capers M.D.   On: 09/04/2018 21:39   US Thoracentesis Asp Pleural Space W/img Guide  Result Date: 09/08/2018 INDICATION: Patient with past medical history of lung cancer, recurrent pleural effusion. Request is made for therapeutic left thoracentesis. EXAM: ULTRASOUND GUIDED THERAPEUTIC LEFT THORACENTESIS MEDICATIONS: 10 mL 1% lidocaine COMPLICATIONS: None immediate. PROCEDURE: An ultrasound guided thoracentesis was thoroughly discussed with the patient and questions answered. The benefits, risks, alternatives and complications were also discussed. The patient understands and wishes to proceed with the procedure. Written consent was obtained. Ultrasound was performed to localize and mark an adequate pocket of fluid in the left chest. The area was then prepped and draped in the normal sterile fashion. 1% Lidocaine was used for local anesthesia. Under ultrasound guidance a 6 Fr Safe-T-Centesis catheter was introduced. Thoracentesis was performed. The catheter was removed and a dressing applied. FINDINGS: A total of approximately 600 mL of amber fluid was removed. Samples were sent to the laboratory as requested by the clinical team. IMPRESSION: Successful ultrasound guided left therapeutic thoracentesis yielding 600 mL of pleural fluid. Read by: Brynda Greathouse PA-C Electronically Signed   By: Markus Daft M.D.   On: 09/08/2018 10:25   US Thoracentesis Asp Pleural Space W/img Guide  Result Date: 09/05/2018 INDICATION: Patient with history of lung cancer, respiratory distress, and left pleural effusion. Request is made for diagnostic and therapeutic left thoracentesis. EXAM: ULTRASOUND GUIDED DIAGNOSTIC AND THERAPEUTIC  LEFT THORACENTESIS MEDICATIONS: 10 mL of 1% lidocaine COMPLICATIONS: None immediate. PROCEDURE: An ultrasound guided thoracentesis was thoroughly discussed with the patient and questions answered. The benefits, risks, alternatives and complications were also discussed. The patient understands and wishes to proceed with the procedure. Written consent was obtained. Ultrasound was performed to localize and mark an adequate pocket of fluid in the left chest. The area was then prepped and draped in the normal sterile fashion. 1% Lidocaine was used for local anesthesia. Under ultrasound guidance a 6 Fr Safe-T-Centesis catheter was introduced. Thoracentesis was performed. The catheter was removed and a dressing applied. FINDINGS: A total of approximately 1 L of blood-tinged fluid was removed. Samples were sent to the laboratory as requested by the clinical team. IMPRESSION: Successful ultrasound guided left thoracentesis yielding 1 L of pleural fluid. Read by: Earley Abide, PA-C Electronically Signed   By: Marybelle Killings M.D.   On: 09/05/2018 13:21    ASSESSMENT AND PLAN:  This is a very pleasant 72 years old Hispanic female with a stage IV non-small cell lung cancer, adenocarcinoma with positive EGFR mutation with deletion in exon 19 diagnosed in July 2013 status post 3 years treatment with Tarceva discontinued secondary to disease progression and development of T7 78 M EGFR resistant mutation. The patient was started on treatment with Tagrisso 80 mg by mouth daily status post 27 months.  She also received palliative radiotherapy to the progressive pulmonary nodules. She has been tolerating her treatment with Tagrisso fairly well. Molecular studies by [employer] showed the development of new resistant mutation C797S.   Unfortunately there is no target option for the new mutation. I had a lengthy discussion with the patient today about her current condition and treatment options.  I discussed with the patient  several options for management of her condition including palliative  care and hospice referral versus consideration of systemic chemotherapy with carboplatin, Alimta and Ketruda (pembrolizumab) or Avastin versus consideration of adding VEGFR2 inhibitor, ramucirumab which she and break clinical as well as clinical data supported the use of the combination of EGFR tyrosine kinase inhibitor in addition to VEGFR2 antagonist like ramucirumab for treatment of patient with metastatic non-small cell lung cancer and positive EGFR mutation.  A new clinical trial was published recently including the combination of ramucirumab and erlotinib and that showed improvement in the progression free survival with a combination, Arlana Pouch, et al,The Lancet Oncology, August 30, 2018). The patient is interested in this option.  She will continue her current treatment with Tagrisso 80 mg p.o. daily.  I will also add ramucirumab 10 mg/KG every 2 weeks.  We will have repeat imaging studies in 2 months and if the patient showed benefit from this treatment she will continue with the same regimen, otherwise we will switch her to systemic chemotherapy. The patient understands that this is still experimental in nature awaiting FDA approval. The patient will also continue with the over-the-counter cough medication as well as Tessalon. I will see her back for follow-up visit in 3 weeks for evaluation before starting cycle #2. The patient was advised to call immediately if she has any concerning symptoms in the interval. All questions were answered. The patient knows to call the clinic with any problems, questions or concerns. We can certainly see the patient much sooner if necessary.  Disclaimer: This note was dictated with voice recognition software. Similar sounding words can inadvertently be transcribed and may not be corrected upon review.

## 2018-09-23 NOTE — Progress Notes (Signed)
CLINICAL TRIAL SELECTED - Non-Small Cell Lung  Other Clinical Trial: cyram  **Trial eligibility and accrual should be confirmed by your research team**  Patient Characteristics: Stage IV Metastatic, Nonsquamous, Second Line - Chemotherapy/Immunotherapy, PS = 0, 1, No Prior PD-1/PD-L1  Inhibitor or Prior PD-1/PD-L1 Inhibitor + Chemotherapy, and Not a Candidate for Immunotherapy AJCC T Category: T2a Current Disease Status: Distant Metastases AJCC N Category: N2 AJCC M Category: M1c AJCC 8 Stage Grouping: IVB Histology: Nonsquamous Cell ROS1 Rearrangement Status: Negative T790M Mutation Status: Positive Following EGFR TKI Other Mutations/Biomarkers: No Other Actionable Mutations NTRK Gene Fusion Status: Negative PD-L1 Expression Status: Quantity Not Sufficient Chemotherapy/Immunotherapy LOT: Second Line Chemotherapy/Immunotherapy Molecular Targeted Therapy: Not Appropriate ALK Translocation Status: Negative EGFR Mutation Status: Sensitizing Uncommon BRAF V600E Mutation Status: Negative Performance Status: PS = 0, 1 Immunotherapy Candidate Status: Not a Candidate for Immunotherapy Prior Immunotherapy Status: No Prior PD-1/PD-L1 Inhibitor Intent of Therapy: Non-Curative / Palliative Intent, Discussed with Patient

## 2018-09-24 ENCOUNTER — Telehealth: Payer: Self-pay

## 2018-09-24 NOTE — Telephone Encounter (Signed)
Incoming call from patient's sister, can hear patient in background.  Reports pt needs to cancel appt here on Friday due to new treatment with Dr Earlie Server. Reminded them that pt will most likely follow up with [place] after treatment is completed and their office will probably let them know so a new f/u appt can be made with [place] in future. Cancelled appt and notified Joylene John NP and nurse navigator Delia Chimes. No other needs per pt at this time.

## 2018-09-26 ENCOUNTER — Inpatient Hospital Stay: Payer: Medicare Other

## 2018-09-26 DIAGNOSIS — D398 Neoplasm of uncertain behavior of other specified female genital organs: Secondary | ICD-10-CM | POA: Diagnosis not present

## 2018-09-26 DIAGNOSIS — C7951 Secondary malignant neoplasm of bone: Secondary | ICD-10-CM

## 2018-09-26 DIAGNOSIS — C7952 Secondary malignant neoplasm of bone marrow: Principal | ICD-10-CM

## 2018-09-26 LAB — CMP (CANCER CENTER ONLY)
ALBUMIN: 2.9 g/dL — AB (ref 3.5–5.0)
ALT: 18 U/L (ref 0–44)
AST: 22 U/L (ref 15–41)
Alkaline Phosphatase: 116 U/L (ref 38–126)
Anion gap: 8 (ref 5–15)
BILIRUBIN TOTAL: 0.7 mg/dL (ref 0.3–1.2)
BUN: 24 mg/dL — ABNORMAL HIGH (ref 8–23)
CALCIUM: 9.2 mg/dL (ref 8.9–10.3)
CHLORIDE: 107 mmol/L (ref 98–111)
CO2: 29 mmol/L (ref 22–32)
Creatinine: 0.94 mg/dL (ref 0.44–1.00)
GFR, Est AFR Am: >60 mL/min (ref >60)
GFR, Estimated: 59 mL/min — ABNORMAL LOW (ref >60)
GLUCOSE: 108 mg/dL — AB (ref 70–99)
POTASSIUM: 4.9 mmol/L (ref 3.5–5.1)
Sodium: 144 mmol/L (ref 135–145)
Total Protein: 6.5 g/dL (ref 6.5–8.1)

## 2018-09-26 LAB — CBC WITH DIFFERENTIAL (CANCER CENTER ONLY)
Abs Immature Granulocytes: 0.03 10*3/uL (ref 0.00–0.07)
BASOS ABS: 0 10*3/uL (ref 0.0–0.1)
BASOS PCT: 0 %
EOS ABS: 0 10*3/uL (ref 0.0–0.5)
Eosinophils Relative: 0 %
HEMATOCRIT: 33.5 % — AB (ref 36.0–46.0)
Hemoglobin: 10.2 g/dL — ABNORMAL LOW (ref 12.0–15.0)
Immature Granulocytes: 0 %
LYMPHS ABS: 0.4 10*3/uL — AB (ref 0.7–4.0)
Lymphocytes Relative: 5 %
MCH: 27.6 pg (ref 26.0–34.0)
MCHC: 30.4 g/dL (ref 30.0–36.0)
MCV: 90.5 fL (ref 80.0–100.0)
Monocytes Absolute: 0.4 10*3/uL (ref 0.1–1.0)
Monocytes Relative: 6 %
NRBC: 0 % (ref 0.0–0.2)
Neutro Abs: 6.5 10*3/uL (ref 1.7–7.7)
Neutrophils Relative %: 89 %
PLATELETS: 142 10*3/uL — AB (ref 150–400)
RBC: 3.7 MIL/uL — ABNORMAL LOW (ref 3.87–5.11)
RDW: 17.2 % — AB (ref 11.5–15.5)
WBC Count: 7.3 10*3/uL (ref 4.0–10.5)

## 2018-09-26 LAB — TOTAL PROTEIN, URINE DIPSTICK: Protein, ur: 30 mg/dL — AB

## 2018-09-27 ENCOUNTER — Ambulatory Visit: Payer: Medicare Other | Admitting: Obstetrics

## 2018-09-30 ENCOUNTER — Inpatient Hospital Stay: Payer: Medicare Other

## 2018-09-30 ENCOUNTER — Inpatient Hospital Stay: Payer: Medicare Other | Attending: Internal Medicine

## 2018-09-30 ENCOUNTER — Encounter: Payer: Self-pay | Admitting: *Deleted

## 2018-09-30 ENCOUNTER — Ambulatory Visit: Payer: Medicare Other

## 2018-09-30 ENCOUNTER — Ambulatory Visit: Payer: Medicare Other | Admitting: Nurse Practitioner

## 2018-09-30 VITALS — BP 132/53 | HR 86 | Temp 97.6°F | Resp 19 | Ht 61.0 in | Wt 134.5 lb

## 2018-09-30 DIAGNOSIS — R630 Anorexia: Secondary | ICD-10-CM | POA: Insufficient documentation

## 2018-09-30 DIAGNOSIS — C7951 Secondary malignant neoplasm of bone: Secondary | ICD-10-CM | POA: Insufficient documentation

## 2018-09-30 DIAGNOSIS — C3490 Malignant neoplasm of unspecified part of unspecified bronchus or lung: Secondary | ICD-10-CM | POA: Insufficient documentation

## 2018-09-30 DIAGNOSIS — C7952 Secondary malignant neoplasm of bone marrow: Secondary | ICD-10-CM

## 2018-09-30 DIAGNOSIS — Z5112 Encounter for antineoplastic immunotherapy: Secondary | ICD-10-CM | POA: Insufficient documentation

## 2018-09-30 DIAGNOSIS — C799 Secondary malignant neoplasm of unspecified site: Secondary | ICD-10-CM

## 2018-09-30 LAB — CBC WITH DIFFERENTIAL (CANCER CENTER ONLY)
ABS IMMATURE GRANULOCYTES: 0.03 10*3/uL (ref 0.00–0.07)
BASOS PCT: 0 %
Basophils Absolute: 0 10*3/uL (ref 0.0–0.1)
Eosinophils Absolute: 0.1 10*3/uL (ref 0.0–0.5)
Eosinophils Relative: 1 %
HCT: 31.9 % — ABNORMAL LOW (ref 36.0–46.0)
HEMOGLOBIN: 10.1 g/dL — AB (ref 12.0–15.0)
Immature Granulocytes: 1 %
Lymphocytes Relative: 6 %
Lymphs Abs: 0.4 10*3/uL — ABNORMAL LOW (ref 0.7–4.0)
MCH: 28.1 pg (ref 26.0–34.0)
MCHC: 31.7 g/dL (ref 30.0–36.0)
MCV: 88.6 fL (ref 80.0–100.0)
MONO ABS: 0.5 10*3/uL (ref 0.1–1.0)
MONOS PCT: 9 %
NEUTROS ABS: 4.9 10*3/uL (ref 1.7–7.7)
Neutrophils Relative %: 83 %
PLATELETS: 140 10*3/uL — AB (ref 150–400)
RBC: 3.6 MIL/uL — AB (ref 3.87–5.11)
RDW: 17 % — ABNORMAL HIGH (ref 11.5–15.5)
WBC Count: 5.9 10*3/uL (ref 4.0–10.5)
nRBC: 0 % (ref 0.0–0.2)

## 2018-09-30 LAB — CMP (CANCER CENTER ONLY)
ALT: 17 U/L (ref 0–44)
ANION GAP: 8 (ref 5–15)
AST: 21 U/L (ref 15–41)
Albumin: 2.8 g/dL — ABNORMAL LOW (ref 3.5–5.0)
Alkaline Phosphatase: 103 U/L (ref 38–126)
BUN: 18 mg/dL (ref 8–23)
CALCIUM: 8.8 mg/dL — AB (ref 8.9–10.3)
CHLORIDE: 105 mmol/L (ref 98–111)
CO2: 25 mmol/L (ref 22–32)
Creatinine: 0.79 mg/dL (ref 0.44–1.00)
GFR, Estimated: >60 mL/min (ref >60)
Glucose, Bld: 106 mg/dL — ABNORMAL HIGH (ref 70–99)
Potassium: 3.9 mmol/L (ref 3.5–5.1)
SODIUM: 138 mmol/L (ref 135–145)
Total Bilirubin: 0.9 mg/dL (ref 0.3–1.2)
Total Protein: 6.3 g/dL — ABNORMAL LOW (ref 6.5–8.1)

## 2018-09-30 MED ORDER — DIPHENHYDRAMINE HCL 50 MG/ML IJ SOLN
INTRAMUSCULAR | Status: AC
  Filled 2018-09-30: qty 1

## 2018-09-30 MED ORDER — SODIUM CHLORIDE 0.9 % IV SOLN
Freq: Once | INTRAVENOUS | Status: AC
  Administered 2018-09-30: 10:00:00 via INTRAVENOUS
  Filled 2018-09-30: qty 250

## 2018-09-30 MED ORDER — ACETAMINOPHEN 325 MG PO TABS
ORAL_TABLET | ORAL | Status: AC
  Filled 2018-09-30: qty 2

## 2018-09-30 MED ORDER — DIPHENHYDRAMINE HCL 50 MG/ML IJ SOLN
50.0000 mg | Freq: Once | INTRAMUSCULAR | Status: AC
  Administered 2018-09-30: 50 mg via INTRAVENOUS

## 2018-09-30 MED ORDER — ACETAMINOPHEN 325 MG PO TABS
650.0000 mg | ORAL_TABLET | Freq: Once | ORAL | Status: AC
  Administered 2018-09-30: 650 mg via ORAL

## 2018-09-30 MED ORDER — SODIUM CHLORIDE 0.9 % IV SOLN
10.0000 mg/kg | Freq: Once | INTRAVENOUS | Status: AC
  Administered 2018-09-30: 600 mg via INTRAVENOUS
  Filled 2018-09-30: qty 10

## 2018-09-30 NOTE — Progress Notes (Signed)
[place] Social Work  Clinical Social Work was referred by patient  to review and complete healthcare advance directives.  Clinical Social Worker met with patient and interpreter in [place] office.  The patient designated niece, Hilton Cork as their primary healthcare agent and ex-spouse Patriciaann Clan as their secondary agent.  Patient also completed healthcare living will.    Clinical Social Worker notarized documents and made copies for patient/family. Clinical Social Worker will send documents to medical records to be scanned into patient's chart. Clinical Social Worker encouraged patient/family to contact with any additional questions or concerns.  Maryjean Morn, MSW, LCSW Clinical Social Worker [facility] [phone]

## 2018-09-30 NOTE — Progress Notes (Signed)
Marta Shorlin ([place] interpreter with [facility]) stayed longer with pt until pt's treatment was completed at 12:00, and then accompanied pt downstairs to [employer] office to complete pt's advance directives.

## 2018-09-30 NOTE — Patient Instructions (Signed)
[facility] Discharge Instructions for Patients Receiving Chemotherapy  Today you received the following chemotherapy agents: Ramucirumab (Cyramza)  To help prevent nausea and vomiting after your treatment, we encourage you to take your nausea medication as directed.    If you develop nausea and vomiting that is not controlled by your nausea medication, call the clinic.   BELOW ARE SYMPTOMS THAT SHOULD BE REPORTED IMMEDIATELY:  *FEVER GREATER THAN 100.5 F  *CHILLS WITH OR WITHOUT FEVER  NAUSEA AND VOMITING THAT IS NOT CONTROLLED WITH YOUR NAUSEA MEDICATION  *UNUSUAL SHORTNESS OF BREATH  *UNUSUAL BRUISING OR BLEEDING  TENDERNESS IN MOUTH AND THROAT WITH OR WITHOUT PRESENCE OF ULCERS  *URINARY PROBLEMS  *BOWEL PROBLEMS  UNUSUAL RASH Items with * indicate a potential emergency and should be followed up as soon as possible.  Feel free to call the clinic should you have any questions or concerns. The clinic phone number is [phone].  Please show the [place] at check-in to the Emergency Department and triage nurse.  Ramucirumab injection Qu es este medicamento? El RAMUCIRUMAB es un anticuerpo monoclonal. [place] para tratar el cncer de estmago, cncer colorrectal o cncer de pulmn. Este medicamento puede ser utilizado para otros usos; si tiene alguna pregunta consulte con su proveedor de atencin mdica o con su farmacutico. MARCAS COMUNES: Cyramza Qu le debo informar a mi profesional de la salud antes de tomar este medicamento? Necesita saber si usted presenta alguno de los siguientes problemas o situaciones: trastornos sanguneos cogulos sanguneos enfermedad cardiaca, incluyendo insuficiencia cardiaca, ataque cardiaco o dolor en el pecho (angina) alta presin sangunea infeccin (especialmente infecciones virales, como varicela o herpes) protena en la orina ciruga reciente derrame cerebral una reaccin alrgica o inusual al ramucirumab, a  otros medicamentos, alimentos, colorantes o conservantes si est embarazada o buscando quedar embarazada si est amamantando a un beb Cmo debo utilizar este medicamento? Este medicamento se administra mediante infusin por va intravenosa. Lo administra un profesional de Technical sales engineer en un hospital o en un entorno clnico. Hable con su pediatra para informarse acerca del uso de este medicamento en nios. Puede requerir atencin especial. Sobredosis: Pngase en contacto inmediatamente con un centro toxicolgico o una sala de urgencia si usted cree que haya tomado demasiado medicamento. ATENCIN: [employer] es solo para usted. No comparta este medicamento con nadie. Qu sucede si me olvido de una dosis? Es importante no olvidar ninguna dosis. Informe a su mdico o a su profesional de la salud si no puede asistir a Photographer. Qu puede interactuar con este medicamento? No se han estudiado las interacciones. Puede ser que esta lista no menciona todas las posibles interacciones. Informe a su profesional de [employer]	[place] de [employer] productos a base de hierbas, medicamentos de [place] o suplementos nutritivos que est tomando. Si usted fuma, consume bebidas alcohlicas o si utiliza drogas ilegales, indqueselo tambin a su profesional de [employer]	[place]. Algunas sustancias pueden interactuar con su medicamento. A qu debo estar atento al usar [employer]? Se supervisar su condicin atentamente mientras reciba este medicamento. Tendr que revisar su presin sangunea y [employer] de [place] y [place] mientras est tomando este medicamento. Se supervisar su estado de salud atentamente mientras reciba este medicamento. [employer] puede aumentar el riesgo de magulladuras o sangrado. Consulte a su mdico o a su profesional de la salud si observa sangrados inusuales. Este medicamento puede causar raramente 'perforacin gastrointestinal' (agujeros en el estmago, intestinos o colon), un efecto  secundario  severo que requieren ciruga para reparar. Este Biomedical engineer por lo menos 28 das despus de la [place] mayor y TEFL teacher de la ciruga debe ser totalmente curado. Consulte a su mdico antes de programar una ciruga o tratamiento dental mientras que est recibiendo [employer]. Hable con su mdico si usted ha tenido [place] recientemente o si tiene una herida que no ha curado. No se debe quedar embarazada mientras est tomando este medicamento o durante 3 meses despus de dejar de tomarlo. Las mujeres deben informar a su mdico si estn buscando quedar embarazadas o si creen que estn embarazadas. Existe la posibilidad de efectos secundarios graves a un beb sin nacer. Para ms informacin hable con su profesional de la salud o su farmacutico. Qu efectos secundarios puedo tener al utilizar este medicamento? Efectos secundarios que debe informar a su mdico o a Barrister's clerk de la salud tan pronto como sea posible: Chief of Staff, como erupcin cutnea, picazn o urticarias, problemas respiratorios, hinchazn de la cara, los labios o la lengua signos de infeccin: fiebre o escalofros, tos, dolor de Press photographer u opresin en el pecho confusin mareos sensacin de desmayos o aturdimiento, cadas dolor abdominal grave nuseas, vmito graves signos y sntomas de Teacher, music, tales como heces con sangre o de color negro y Curator alquitranado; [place] de color rojo o marrn oscuro; escupir sangre o material marrn que tiene el aspecto de granos de caf molido; Tree surgeon rojas en la piel; sangrado o moretones inusuales en los ojos, las encas o la nariz signos y sntomas de un cogulo sanguneo, tales como problemas respiratorios; cambios en la visin; dolor en el pecho; dolor de cabeza severo, repentino; dolor, hinchazn, calor en la pierna; dificultad para hablar; entumecimiento o debilidad repentina de la cara, el brazo o la pierna sntomas de un derrame cerebral: cambio en la  claridad mental, incapacidad para hablar o mover un lado del cuerpo dificultad para caminar, mareos, prdida del equilibrio o la coordinacin Efectos secundarios que generalmente no requieren Geophysical data processor (infrmelos a su mdico o a Barrister's clerk de la salud si persisten o si son molestos): piel fra y sudorosa estreimiento diarrea dolor de cabeza nuseas, vmito dolor estomacal ritmo cardiaco inusualmente lento cansancio o debilidad inusual Puede ser que esta lista no menciona todos los posibles efectos secundarios. Comunquese a su mdico por asesoramiento mdico [employer] Usted puede informar los efectos secundarios a la FDA por telfono al 1-800-FDA-1088. Dnde debo guardar mi medicina? Este medicamento se administra en hospitales o clnicas y no necesitar guardarlo en su domicilio. ATENCIN: Este folleto es un resumen. Puede ser que no cubra toda la posible informacin. Si usted tiene preguntas acerca de esta medicina, consulte con su mdico, su farmacutico o su profesional de Technical sales engineer.  2018 Elsevier/Gold Standard (2016-12-14 00:00:00)

## 2018-10-03 ENCOUNTER — Telehealth: Payer: Self-pay

## 2018-10-03 NOTE — Telephone Encounter (Signed)
Oral Oncology Patient Advocate Encounter  I was successful at securing a grant with [facility]) for $5300. This will keep the out of pocket expense for Tagrisso at $0. The grant information is as follows and has been shared with [place].  Approval dates: 07/02/18-09/30/19 ID: [id number] Group: [id number] BIN: [id number] PCN: [id number]  I called the patient and explained this to her. I also explained that she would get Tagrisso from [employer] for the rest of this year, starting in January she would get the [place] from [facility] and use the grant to cover her copay. I will still get her to sign the [employer] application when she is here 11/19 so that we can use it if we need it in 2020.   She verbalized understanding and great appreciation.  Rafael Hernandez Patient [place] Phone [phone] Fax [phone]

## 2018-10-04 ENCOUNTER — Telehealth: Payer: Self-pay | Admitting: Medical Oncology

## 2018-10-04 NOTE — Telephone Encounter (Signed)
"  I don't feel any change since taking the treatment. I feel very good". Instructed to call for any problems.

## 2018-10-07 ENCOUNTER — Observation Stay (HOSPITAL_COMMUNITY)
Admission: EM | Admit: 2018-10-07 | Discharge: 2018-10-09 | Disposition: A | Discharge status: Home / Self Care | Payer: Medicare Other | Attending: Internal Medicine | Admitting: Internal Medicine

## 2018-10-07 ENCOUNTER — Encounter (HOSPITAL_COMMUNITY): Payer: Self-pay | Admitting: Emergency Medicine

## 2018-10-07 ENCOUNTER — Other Ambulatory Visit: Payer: Self-pay

## 2018-10-07 ENCOUNTER — Emergency Department (HOSPITAL_COMMUNITY): Payer: Medicare Other

## 2018-10-07 DIAGNOSIS — J9621 Acute and chronic respiratory failure with hypoxia: Secondary | ICD-10-CM | POA: Diagnosis not present

## 2018-10-07 DIAGNOSIS — Z79899 Other long term (current) drug therapy: Secondary | ICD-10-CM | POA: Insufficient documentation

## 2018-10-07 DIAGNOSIS — Z85118 Personal history of other malignant neoplasm of bronchus and lung: Secondary | ICD-10-CM | POA: Insufficient documentation

## 2018-10-07 DIAGNOSIS — J90 Pleural effusion, not elsewhere classified: Secondary | ICD-10-CM | POA: Diagnosis not present

## 2018-10-07 DIAGNOSIS — Z880 Allergy status to penicillin: Secondary | ICD-10-CM | POA: Insufficient documentation

## 2018-10-07 DIAGNOSIS — R0602 Shortness of breath: Secondary | ICD-10-CM | POA: Diagnosis not present

## 2018-10-07 DIAGNOSIS — R0600 Dyspnea, unspecified: Secondary | ICD-10-CM

## 2018-10-07 DIAGNOSIS — I10 Essential (primary) hypertension: Secondary | ICD-10-CM | POA: Insufficient documentation

## 2018-10-07 DIAGNOSIS — E43 Unspecified severe protein-calorie malnutrition: Secondary | ICD-10-CM | POA: Insufficient documentation

## 2018-10-07 DIAGNOSIS — D630 Anemia in neoplastic disease: Secondary | ICD-10-CM | POA: Insufficient documentation

## 2018-10-07 DIAGNOSIS — F419 Anxiety disorder, unspecified: Secondary | ICD-10-CM | POA: Diagnosis not present

## 2018-10-07 DIAGNOSIS — J910 Malignant pleural effusion: Principal | ICD-10-CM | POA: Insufficient documentation

## 2018-10-07 DIAGNOSIS — C799 Secondary malignant neoplasm of unspecified site: Secondary | ICD-10-CM | POA: Diagnosis present

## 2018-10-07 DIAGNOSIS — C3492 Malignant neoplasm of unspecified part of left bronchus or lung: Secondary | ICD-10-CM | POA: Diagnosis present

## 2018-10-07 DIAGNOSIS — Z9889 Other specified postprocedural states: Secondary | ICD-10-CM

## 2018-10-07 LAB — COMPREHENSIVE METABOLIC PANEL
ALT: 18 U/L (ref 0–44)
ANION GAP: 7 (ref 5–15)
AST: 28 U/L (ref 15–41)
Albumin: 2.8 g/dL — ABNORMAL LOW (ref 3.5–5.0)
Alkaline Phosphatase: 104 U/L (ref 38–126)
BUN: 21 mg/dL (ref 8–23)
CHLORIDE: 104 mmol/L (ref 98–111)
CO2: 25 mmol/L (ref 22–32)
Calcium: 8.6 mg/dL — ABNORMAL LOW (ref 8.9–10.3)
Creatinine, Ser: 0.65 mg/dL (ref 0.44–1.00)
GFR calc non Af Amer: >60 mL/min (ref >60)
Glucose, Bld: 102 mg/dL — ABNORMAL HIGH (ref 70–99)
Potassium: 4.3 mmol/L (ref 3.5–5.1)
SODIUM: 136 mmol/L (ref 135–145)
Total Bilirubin: 0.6 mg/dL (ref 0.3–1.2)
Total Protein: 6.4 g/dL — ABNORMAL LOW (ref 6.5–8.1)

## 2018-10-07 LAB — CBC WITH DIFFERENTIAL/PLATELET
Abs Immature Granulocytes: 0.01 10*3/uL (ref 0.00–0.07)
BASOS ABS: 0 10*3/uL (ref 0.0–0.1)
Basophils Relative: 0 %
Eosinophils Absolute: 0 10*3/uL (ref 0.0–0.5)
Eosinophils Relative: 1 %
HEMATOCRIT: 30.9 % — AB (ref 36.0–46.0)
Hemoglobin: 9.5 g/dL — ABNORMAL LOW (ref 12.0–15.0)
IMMATURE GRANULOCYTES: 0 %
LYMPHS ABS: 0.4 10*3/uL — AB (ref 0.7–4.0)
LYMPHS PCT: 8 %
MCH: 27.9 pg (ref 26.0–34.0)
MCHC: 30.7 g/dL (ref 30.0–36.0)
MCV: 90.9 fL (ref 80.0–100.0)
Monocytes Absolute: 0.6 10*3/uL (ref 0.1–1.0)
Monocytes Relative: 11 %
NRBC: 0 % (ref 0.0–0.2)
Neutro Abs: 4.5 10*3/uL (ref 1.7–7.7)
Neutrophils Relative %: 80 %
Platelets: 146 10*3/uL — ABNORMAL LOW (ref 150–400)
RBC: 3.4 MIL/uL — ABNORMAL LOW (ref 3.87–5.11)
RDW: 17.1 % — AB (ref 11.5–15.5)
WBC: 5.6 10*3/uL (ref 4.0–10.5)

## 2018-10-07 NOTE — ED Notes (Signed)
Bed: WA07 Expected date:  Expected time:  Means of arrival:  Comments: Triage 1

## 2018-10-07 NOTE — ED Provider Notes (Signed)
[facility] DEPT Provider Note   CSN: [id number] Arrival date & time: 10/07/18  [place]     History   Chief Complaint Chief Complaint  Patient presents with  . Shortness of Breath    HPI Jacqueline Leonard is a 72 y.o. female.  Jacqueline Leonard is a 73 y.o. Female with a history of metastatic lung cancer, hypertension, hyperlipidemia, who presents to the emergency department for evaluation of shortness of breath.  She reports over the past 5 days she has had worsening shortness of breath.  It has become especially bad yesterday and today.  Shortness of breath is worse when she is trying to get up and walk around, but today is present even at rest.  She reports intermittent dry cough.  No fevers or chills.  No associated chest pain, no pleuritic pain.  No abdominal pain, nausea or vomiting.  No lightheadedness or syncope.  Patient reports similar shortness of breath last month, when she was admitted to the hospital for a large left pleural effusion which was drained by IR, she reports after that she had significant improvement in her symptoms but they seem to have returned.  No oxygen requirements at baseline.  Is followed by Dr. Earlie Server with oncology.     Past Medical History:  Diagnosis Date  . Allergy   . Anxiety   . External hemorrhoids   . Family history of breast cancer   . Family history of lung cancer   . Hyperlipidemia   . Hypertension   . Lung cancer metastatic to bone ([place]) 06/19/2012   bx=L 5th ribmetastatic Adenocarcinoma with known lung mass  . Radiation 07/11/12-07/24/12   Palliative lung tx 30 gray in 10 fx  . S/P radiation therapy 07/22/14-07/31/14   left lung/60gy/19fx  . Status post chemoradiation    [place]  . Vitamin D deficiency     Patient Active Problem List   Diagnosis Date Noted  . Goals of care, counseling/discussion 09/23/2018  . Pleural effusion 09/05/2018  . Malnutrition of moderate degree 09/05/2018  . Pelvic mass in female  09/02/2018  . Genetic testing 08/30/2018  . Family history of breast cancer   . Family history of lung cancer   . UTI (urinary tract infection) 12/26/2016  . Acute cystitis without hematuria   . Hyponatremia   . Epistaxis 10/04/2016  . Adenocarcinoma of lung, stage 4, left ([place]) 06/13/2016  . Bone metastases ([place]) 04/11/2016  . Encounter for antineoplastic chemotherapy 02/14/2016  . Anemia in neoplastic disease 06/29/2015  . Pulmonary metastasis ([place]) 07/16/2014  . Vitamin B12 deficiency anemia 02/12/2014  . Secondary malignant neoplasm of bone and bone marrow ([place]) 06/29/2012  . Metastatic adenocarcinoma, pathology 06/19/12 06/27/2012  . Hypertension   . Hyperlipidemia   . Vitamin D deficiency     Past Surgical History:  Procedure Laterality Date  . APPENDECTOMY     done at time of ovarian surgery, not ruptured.  . OVARIAN CYST REMOVAL     twice - 1970's and 1980's  . SOFT TISSUE BIOPSY  06/19/12   L 5th rib=metastatic adenocarcinoma     OB History    Gravida  0   Para  0   Term  0   Preterm  0   AB  0   Living  0     SAB  0   TAB  0   Ectopic  0   Multiple  0   Live Births  0  Home Medications    Prior to Admission medications   Medication Sig Start Date End Date Taking? Authorizing Provider  cholecalciferol (VITAMIN D) 1000 UNITS tablet Take 1,000 Units by mouth daily.    [provider]  escitalopram (LEXAPRO) 10 MG tablet Take 1 tablet (10 mg total) by mouth daily. 09/08/18   Georgette Shell, MD  mirtazapine (REMERON) 30 MG tablet Take 1 tablet (30 mg total) by mouth at bedtime. 08/21/18   Rutherford Guys, MD  ondansetron (ZOFRAN) 4 MG tablet Take 1 tablet (4 mg total) by mouth every 6 (six) hours as needed for nausea. 09/07/18   Georgette Shell, MD  osimertinib mesylate (TAGRISSO) 80 MG tablet Take 1 tablet (80 mg total) by mouth daily. 08/12/18   Maryanna Shape, NP  Phenyleph-Doxylamine-DM-APAP (NYQUIL SEVERE  COLD/FLU PO) Take 5 mLs by mouth daily as needed (cough). Once at bedtime     [provider]  predniSONE (DELTASONE) 5 MG tablet Take 1 tablet (5 mg total) by mouth daily with breakfast. 09/08/18   Georgette Shell, MD  vitamin B-12 (CYANOCOBALAMIN) 100 MCG tablet Take 1 tablet by mouth daily.    [provider]    Family History Family History  Problem Relation Age of Onset  . Lung cancer Brother   . Breast cancer Sister        dx early 63s, d 6  . Breast cancer Sister 72       d 17s    Social History Social History   Tobacco Use  . Smoking status: Never Smoker  . Smokeless tobacco: Never Used  Substance Use Topics  . Alcohol use: No  . Drug use: No     Allergies   Penicillins   Review of Systems Review of Systems  Constitutional: Negative for chills and fever.  HENT: Negative.   Eyes: Negative for visual disturbance.  Respiratory: Positive for cough and shortness of breath. Negative for chest tightness and wheezing.   Cardiovascular: Negative for chest pain and leg swelling.  Gastrointestinal: Negative for abdominal pain, nausea and vomiting.  Genitourinary: Negative for dysuria and frequency.  Musculoskeletal: Negative for arthralgias, back pain and myalgias.  Skin: Negative for color change and rash.  Neurological: Negative for dizziness, syncope and light-headedness.     Physical Exam Updated Vital Signs BP (!) 143/69 (BP Location: Left Arm)   Pulse 88   Temp 97.7 F (36.5 C) (Oral)   Resp 14   Ht 5\' 1"  (1.549 m)   Wt 61 kg   SpO2 95%   BMI 25.41 kg/m   Physical Exam  Constitutional: She appears well-developed and well-nourished. She does not appear ill. No distress.  HENT:  Head: Normocephalic and atraumatic.  Mouth/Throat: Oropharynx is clear and moist.  Eyes: Right eye exhibits no discharge. Left eye exhibits no discharge.  Neck: Normal range of motion. Neck supple. No JVD present. No tracheal deviation present.    Cardiovascular: Normal rate, regular rhythm, normal heart sounds and intact distal pulses. Exam reveals no gallop and no friction rub.  No murmur heard. Pulmonary/Chest: No respiratory distress.  Patient is tachypneic with moderately increased respiratory effort, able to speak in short sentences on room air, lung sounds significantly diminished in the left lower lung fields, no wheezes rhonchi's or rales noted  Abdominal: Soft. Bowel sounds are normal. She exhibits no distension and no mass. There is no tenderness. There is no guarding.  Abdomen soft, nondistended, nontender to palpation in all quadrants  without guarding or peritoneal signs  Musculoskeletal:       Right lower leg: Normal. She exhibits no tenderness and no edema.       Left lower leg: Normal. She exhibits no tenderness and no edema.  Neurological: She is alert. Coordination normal.  Skin: Skin is warm. Capillary refill takes less than 2 seconds. She is not diaphoretic.  Psychiatric: She has a normal mood and affect. Her behavior is normal.  Nursing note and vitals reviewed.    ED Treatments / Results  Labs (all labs ordered are listed, but only abnormal results are displayed) Labs Reviewed  COMPREHENSIVE METABOLIC PANEL - Abnormal; Notable for the following components:      Result Value   Glucose, Bld 102 (*)    Calcium 8.6 (*)    Total Protein 6.4 (*)    Albumin 2.8 (*)    All other components within normal limits  CBC WITH DIFFERENTIAL/PLATELET - Abnormal; Notable for the following components:   RBC 3.40 (*)    Hemoglobin 9.5 (*)    HCT 30.9 (*)    RDW 17.1 (*)    Platelets 146 (*)    Lymphs Abs 0.4 (*)    All other components within normal limits    EKG None  Radiology Dg Chest 2 View  Result Date: 10/07/2018 CLINICAL DATA:  Shortness of breath.  History of lung carcinoma EXAM: CHEST - 2 VIEW COMPARISON:  September 20, 2018 chest radiograph; chest CT September 05, 2018 FINDINGS: There remains a sizable  partially loculated right pleural effusion with consolidation throughout the right mid and lower lung zones. Consolidation remains throughout the right upper lobe with volume loss the right apex. No new opacity is evident. Heart size and pulmonary vascularity are normal. No adenopathy. No bone lesions. IMPRESSION: Sizable left pleural effusion with consolidation throughout the left mid and lower lung zones. Consolidation with volume loss right upper lobe. Stable cardiac silhouette. No new opacity. Electronically Signed   By: Lowella Grip III M.D.   On: 10/07/2018 20:32    Procedures Procedures (including critical care time)  Medications Ordered in ED Medications - No data to display   Initial Impression / Assessment and Plan / ED Course  I have reviewed the triage vital signs and the nursing notes.  Pertinent labs & imaging results that were available during my care of the patient were reviewed by me and considered in my medical decision making (see chart for details).  Patient with metastatic lung cancer presenting with 5 days of worsening dyspnea.  On exam lung sounds are diminished in the left lower lung, patient has history of pleural effusion in this area that has required drainage in the past and I suspect that this is what is going on once again.  She has not had any fevers or chills, has had intermittent dry cough.  There is no associated chest pain or pleuritic pain.  Patient is not ill-appearing but does have increased work of breathing and tachypnea, not requiring any supplemental oxygen at this time.  Will get basic labs, chest x-ray and EKG.  Labs show no leukocytosis further supporting that this is not an infectious process, stable hemoglobin, no acute electrolyte derangements requiring intervention and normal renal and liver function.  Chest x-ray shows sizable left pleural effusion with consolidation throughout the left mid and lower lung zones, consolidation with volume loss in  the upper lobes, but no new opacities.  Patient appears uncomfortable with breathing and remains tachypneic, I  feel she would benefit from admission with repeat drainage of pleural effusion, will discuss with hospitalist for admission.  Case discussed with Dr. Hal Hope who will see and admit the patient.  Patient discussed with Dr. Alvino Chapel, who saw patient as well and agrees with plan.  Final Clinical Impressions(s) / ED Diagnoses   Final diagnoses:  Pleural effusion, left  Dyspnea, unspecified type    ED Discharge Orders    None       Janet Berlin 10/07/18 [id number]    Davonna Belling, MD 10/08/18 [phone]

## 2018-10-07 NOTE — ED Notes (Signed)
ED TO INPATIENT HANDOFF REPORT  Name/Age/Gender Jacqueline Leonard 72 y.o. female  Code Status Code Status History    Date Active Date Inactive Code Status Order ID Comments User Context   09/05/2018 0209 09/08/2018 1644 Full Code [id number]  Vianne Bulls, MD ED    Advance Directive Documentation     Most Recent Value  Type of Advance Directive  Healthcare Power of Attorney  Pre-existing out of facility DNR order (yellow form or pink MOST form)  -  "MOST" Form in Place?  -      Home/SNF/Other Home  Chief Complaint Shortness of Breath;Weakness  Level of Care/Admitting Diagnosis ED Disposition    ED Disposition Condition [place]: [place] [100102]  Level of Care: Telemetry [5]  Admit to tele based on following criteria: Monitor for Ischemic changes  Diagnosis: Pleural effusion [366440]  Admitting Physician: Rise Patience [phone]  Attending Physician: Rise Patience [[id number]]  PT Class (Do Not Modify): Observation [104]  PT Acc Code (Do Not Modify): Observation [10022]       Medical History Past Medical History:  Diagnosis Date  . Allergy   . Anxiety   . External hemorrhoids   . Family history of breast cancer   . Family history of lung cancer   . Hyperlipidemia   . Hypertension   . Lung cancer metastatic to bone ([place]) 06/19/2012   bx=L 5th ribmetastatic Adenocarcinoma with known lung mass  . Radiation 07/11/12-07/24/12   Palliative lung tx 30 gray in 10 fx  . S/P radiation therapy 07/22/14-07/31/14   left lung/60gy/42f  . Status post chemoradiation    T[place]ceva  . Vitamin D deficiency     Allergies Allergies  Allergen Reactions  . Penicillins Palpitations and Other (See Comments)    Has patient had a PCN reaction causing immediate rash, facial/tongue/throat swelling, SOB or lightheadedness with hypotension: No Has patient had a PCN reaction causing severe rash involving mucus membranes or skin necrosis:  No Has patient had a PCN reaction that required hospitalization No Has patient had a PCN reaction occurring within the last 10 years: Yes If all of the above answers are "NO", then may proceed with Cephalosporin use.    IV Location/Drains/Wounds Patient Lines/Drains/Airways Status   Active Line/Drains/Airways    Name:   Placement date:   Placement time:   Site:   Days:   Peripheral IV 10/07/18 Right Wrist   10/07/18    2010    Wrist   less than 1          Labs/Imaging Results for orders placed or performed during the hospital encounter of 10/07/18 (from the past 48 hour(s))  Comprehensive metabolic panel     Status: Abnormal   Collection Time: 10/07/18  8:08 PM  Result Value Ref Range   Sodium 136 135 - 145 mmol/L   Potassium 4.3 3.5 - 5.1 mmol/L   Chloride 104 98 - 111 mmol/L   CO2 25 22 - 32 mmol/L   Glucose, Bld 102 (H) 70 - 99 mg/dL   BUN 21 8 - 23 mg/dL   Creatinine, Ser 0.65 0.44 - 1.00 mg/dL   Calcium 8.6 (L) 8.9 - 10.3 mg/dL   Total Protein 6.4 (L) 6.5 - 8.1 g/dL   Albumin 2.8 (L) 3.5 - 5.0 g/dL   AST 28 15 - 41 U/L   ALT 18 0 - 44 U/L   Alkaline Phosphatase 104 38 - 126 U/L  Total Bilirubin 0.6 0.3 - 1.2 mg/dL   GFR calc non Af Amer >60 >60 mL/min   GFR calc Af Amer >60 >60 mL/min    Comment: (NOTE) The eGFR has been calculated using the CKD EPI equation. This calculation has not been validated in all clinical situations. eGFR's persistently <60 mL/min signify possible Chronic Kidney Disease.    Anion gap 7 5 - 15    Comment: Performed at [facility], [place] [address], [place], [place] [address]  CBC with Differential     Status: Abnormal   Collection Time: 10/07/18  8:08 PM  Result Value Ref Range   WBC 5.6 4.0 - 10.5 K/uL   RBC 3.40 (L) 3.87 - 5.11 MIL/uL   Hemoglobin 9.5 (L) 12.0 - 15.0 g/dL   HCT 30.9 (L) 36.0 - 46.0 %   MCV 90.9 80.0 - 100.0 fL   MCH 27.9 26.0 - 34.0 pg   MCHC 30.7 30.0 - 36.0 g/dL   RDW 17.1 (H) 11.5 - 15.5 %    Platelets 146 (L) 150 - 400 K/uL   nRBC 0.0 0.0 - 0.2 %   Neutrophils Relative % 80 %   Neutro Abs 4.5 1.7 - 7.7 K/uL   Lymphocytes Relative 8 %   Lymphs Abs 0.4 (L) 0.7 - 4.0 K/uL   Monocytes Relative 11 %   Monocytes Absolute 0.6 0.1 - 1.0 K/uL   Eosinophils Relative 1 %   Eosinophils Absolute 0.0 0.0 - 0.5 K/uL   Basophils Relative 0 %   Basophils Absolute 0.0 0.0 - 0.1 K/uL   Immature Granulocytes 0 %   Abs Immature Granulocytes 0.01 0.00 - 0.07 K/uL    Comment: Performed at [facility], [place] [address], [place], [place] [address]   Dg Chest 2 View  Result Date: 10/07/2018 CLINICAL DATA:  Shortness of breath.  History of lung carcinoma EXAM: CHEST - 2 VIEW COMPARISON:  September 20, 2018 chest radiograph; chest CT September 05, 2018 FINDINGS: There remains a sizable partially loculated right pleural effusion with consolidation throughout the right mid and lower lung zones. Consolidation remains throughout the right upper lobe with volume loss the right apex. No new opacity is evident. Heart size and pulmonary vascularity are normal. No adenopathy. No bone lesions. IMPRESSION: Sizable left pleural effusion with consolidation throughout the left mid and lower lung zones. Consolidation with volume loss right upper lobe. Stable cardiac silhouette. No new opacity. Electronically Signed   By: Lowella Grip III M.D.   On: 10/07/2018 20:32   None  Pending Labs Unresulted Labs (From admission, onward)   None      Vitals/Pain Today's Vitals   10/07/18 1914 10/07/18 1930 10/07/18 2030 10/07/18 2200  BP:   (!) 165/65 (!) 158/66  Pulse:  92 85 89  Resp:   (!) 32 (!) 34  Temp:      TempSrc:      SpO2:  95% 94% 93%  Weight: 61 kg     Height: 5' 1"  (1.549 m)     PainSc: 0-No pain       Isolation Precautions No active isolations  Medications Medications - No data to display  Mobility walks with person assist

## 2018-10-07 NOTE — ED Notes (Signed)
Patient transported to X-ray 

## 2018-10-07 NOTE — ED Triage Notes (Signed)
Pt arriving POV with shortness of breath x2 days. Pt has hx of lung cancer. Pt A&O x4. On room air.

## 2018-10-07 NOTE — ED Notes (Signed)
Report given to Verdis Frederickson, RN  4th Floor.

## 2018-10-08 ENCOUNTER — Observation Stay (HOSPITAL_COMMUNITY): Payer: Medicare Other

## 2018-10-08 ENCOUNTER — Encounter (HOSPITAL_COMMUNITY): Payer: Self-pay | Admitting: Nurse Practitioner

## 2018-10-08 DIAGNOSIS — J90 Pleural effusion, not elsewhere classified: Secondary | ICD-10-CM | POA: Diagnosis present

## 2018-10-08 DIAGNOSIS — R0600 Dyspnea, unspecified: Secondary | ICD-10-CM

## 2018-10-08 DIAGNOSIS — C3492 Malignant neoplasm of unspecified part of left bronchus or lung: Secondary | ICD-10-CM | POA: Diagnosis not present

## 2018-10-08 DIAGNOSIS — D630 Anemia in neoplastic disease: Secondary | ICD-10-CM | POA: Diagnosis not present

## 2018-10-08 DIAGNOSIS — J9621 Acute and chronic respiratory failure with hypoxia: Secondary | ICD-10-CM

## 2018-10-08 DIAGNOSIS — C799 Secondary malignant neoplasm of unspecified site: Secondary | ICD-10-CM

## 2018-10-08 DIAGNOSIS — J910 Malignant pleural effusion: Secondary | ICD-10-CM

## 2018-10-08 LAB — CBC
HEMATOCRIT: 28.7 % — AB (ref 36.0–46.0)
Hemoglobin: 8.7 g/dL — ABNORMAL LOW (ref 12.0–15.0)
MCH: 28.6 pg (ref 26.0–34.0)
MCHC: 30.3 g/dL (ref 30.0–36.0)
MCV: 94.4 fL (ref 80.0–100.0)
NRBC: 0 % (ref 0.0–0.2)
PLATELETS: 105 10*3/uL — AB (ref 150–400)
RBC: 3.04 MIL/uL — AB (ref 3.87–5.11)
RDW: 17.2 % — AB (ref 11.5–15.5)
WBC: 4.4 10*3/uL (ref 4.0–10.5)

## 2018-10-08 LAB — BASIC METABOLIC PANEL
Anion gap: 5 (ref 5–15)
BUN: 20 mg/dL (ref 8–23)
CALCIUM: 8.2 mg/dL — AB (ref 8.9–10.3)
CHLORIDE: 106 mmol/L (ref 98–111)
CO2: 26 mmol/L (ref 22–32)
CREATININE: 0.6 mg/dL (ref 0.44–1.00)
GFR calc non Af Amer: >60 mL/min (ref >60)
GLUCOSE: 98 mg/dL (ref 70–99)
Potassium: 4.1 mmol/L (ref 3.5–5.1)
Sodium: 137 mmol/L (ref 135–145)

## 2018-10-08 LAB — ALBUMIN, PLEURAL OR PERITONEAL FLUID: ALBUMIN FL: 2.1 g/dL

## 2018-10-08 LAB — GRAM STAIN

## 2018-10-08 LAB — LACTATE DEHYDROGENASE, PLEURAL OR PERITONEAL FLUID: LD FL: 337 U/L — AB (ref 3–23)

## 2018-10-08 LAB — BODY FLUID CELL COUNT WITH DIFFERENTIAL
EOS FL: 1 %
Lymphs, Fluid: 54 %
Monocyte-Macrophage-Serous Fluid: 41 % — ABNORMAL LOW (ref 50–90)
NEUTROPHIL FLUID: 4 % (ref 0–25)
Total Nucleated Cell Count, Fluid: 213 cu mm (ref 0–1000)

## 2018-10-08 LAB — PROTIME-INR
INR: 1.14
Prothrombin Time: 14.5 seconds (ref 11.4–15.2)

## 2018-10-08 LAB — PROTEIN, PLEURAL OR PERITONEAL FLUID: TOTAL PROTEIN, FLUID: 4.1 g/dL

## 2018-10-08 MED ORDER — ESCITALOPRAM OXALATE 10 MG PO TABS
10.0000 mg | ORAL_TABLET | Freq: Every day | ORAL | Status: DC
  Administered 2018-10-08 – 2018-10-09 (×2): 10 mg via ORAL
  Filled 2018-10-08 (×2): qty 1

## 2018-10-08 MED ORDER — MIRTAZAPINE 15 MG PO TABS
30.0000 mg | ORAL_TABLET | Freq: Every day | ORAL | Status: DC
  Administered 2018-10-08: 30 mg via ORAL
  Filled 2018-10-08: qty 2

## 2018-10-08 MED ORDER — OSIMERTINIB MESYLATE 80 MG PO TABS
80.0000 mg | ORAL_TABLET | Freq: Every day | ORAL | Status: DC
  Administered 2018-10-08 – 2018-10-09 (×2): 80 mg via ORAL
  Filled 2018-10-08: qty 1

## 2018-10-08 MED ORDER — ONDANSETRON HCL 4 MG/2ML IJ SOLN: 4.0000 mg | Freq: Four times a day (QID) | INTRAMUSCULAR | Status: DC | PRN

## 2018-10-08 MED ORDER — ACETAMINOPHEN 325 MG PO TABS: 650.0000 mg | ORAL_TABLET | Freq: Four times a day (QID) | ORAL | Status: DC | PRN

## 2018-10-08 MED ORDER — PREDNISONE 5 MG PO TABS
5.0000 mg | ORAL_TABLET | Freq: Every day | ORAL | Status: DC
  Administered 2018-10-08 – 2018-10-09 (×2): 5 mg via ORAL
  Filled 2018-10-08 (×2): qty 1

## 2018-10-08 MED ORDER — VITAMIN B-12 100 MCG PO TABS
100.0000 ug | ORAL_TABLET | Freq: Every day | ORAL | Status: DC
  Administered 2018-10-08 – 2018-10-09 (×2): 100 ug via ORAL
  Filled 2018-10-08 (×2): qty 1

## 2018-10-08 MED ORDER — LIDOCAINE HCL 1 % IJ SOLN
INTRAMUSCULAR | Status: AC
  Filled 2018-10-08: qty 20

## 2018-10-08 MED ORDER — GUAIFENESIN-DM 100-10 MG/5ML PO SYRP
5.0000 mL | ORAL_SOLUTION | Freq: Four times a day (QID) | ORAL | Status: DC | PRN
  Administered 2018-10-08: 5 mL via ORAL
  Filled 2018-10-08: qty 10

## 2018-10-08 MED ORDER — ACETAMINOPHEN 650 MG RE SUPP: 650.0000 mg | Freq: Four times a day (QID) | RECTAL | Status: DC | PRN

## 2018-10-08 MED ORDER — VITAMIN D3 25 MCG (1000 UNIT) PO TABS
1000.0000 [IU] | ORAL_TABLET | Freq: Every day | ORAL | Status: DC
  Administered 2018-10-08 – 2018-10-09 (×2): 1000 [IU] via ORAL

## 2018-10-08 MED ORDER — ONDANSETRON HCL 4 MG PO TABS: 4.0000 mg | ORAL_TABLET | Freq: Four times a day (QID) | ORAL | Status: DC | PRN

## 2018-10-08 NOTE — Discharge Summary (Signed)
Physician Discharge Summary  Maverick Patman ZOX:[id number] DOB: 25-Mar-1946 DOA: 10/07/2018  PCP: Rutherford Guys, MD  Admit date: 10/07/2018 Discharge date: 10/08/2018  Admitted From: home Disposition:  Home  Recommendations for Outpatient Follow-up:  1. Follow up with Oncology in 1-2 weeks   Home Health:No  Equipment/Devices:None   Discharge Condition:Stable CODE STATUS:Full  Diet recommendation: Heart Healthy   Brief/Interim Summary: 72 y.o. female past medical history of metastatic adenocarcinoma of the lung presents with worsening shortness of breath which has progressively gotten worse over the last 2 weeks, with nonproductive cough denies any chest pain, fever chills nausea vomiting.  Discharge Diagnoses:  Principal Problem:   Pleural effusion, left Active Problems:   Metastatic adenocarcinoma, pathology 06/19/12   Anemia in neoplastic disease   Adenocarcinoma of lung, stage 4, left (HCC)   Pleural effusion   Dyspnea   Acute on chronic respiratory failure with hypoxia (HCC)  Acute on chronic respiratory failure with hypoxia likely due to Pleural effusion, left/  Metastatic adenocarcinoma, pathology 06/19/12: With a known history history of malignant pleural effusion the last one was on 09/05/2018, we have consulted IR who has drained about 700 cc of fluid. Her chest x-ray showed no pneumothorax. She relates her breathing is much better. She will follow-up with Dr. Julien Nordmann as an outpatient to continue her chemotherapy.  Anemia in neoplastic disease: Likely due to chemotherapy, it seems to be stable.   Severe protein caloric malnutrition: Patient is cachectic and physical exam. Continue Ensure 3 times daily as an outpatient.  RN Pressure Injury Documentation:  Estimated body mass index is 25.87 kg/m as calculated from the following:   Height as of this encounter: 5\' 1"  (1.549 m).   Weight as of this encounter: 62.1 kg.  Discharge  Instructions  Discharge Instructions    Diet - low sodium heart healthy   Complete by:  As directed    Increase activity slowly   Complete by:  As directed      Allergies as of 10/08/2018      Reactions   Penicillins Palpitations, Other (See Comments)   Has patient had a PCN reaction causing immediate rash, facial/tongue/throat swelling, SOB or lightheadedness with hypotension: No Has patient had a PCN reaction causing severe rash involving mucus membranes or skin necrosis: No Has patient had a PCN reaction that required hospitalization No Has patient had a PCN reaction occurring within the last 10 years: Yes If all of the above answers are "NO", then may proceed with Cephalosporin use.      Medication List    TAKE these medications   cholecalciferol 1000 units tablet Commonly known as:  VITAMIN D Take 1,000 Units by mouth daily.   escitalopram 10 MG tablet Commonly known as:  LEXAPRO Take 1 tablet (10 mg total) by mouth daily.   mirtazapine 30 MG tablet Commonly known as:  REMERON Take 1 tablet (30 mg total) by mouth at bedtime.   NYQUIL SEVERE COLD/FLU PO Take 5 mLs by mouth daily as needed (cough). Once at bedtime   ondansetron 4 MG tablet Commonly known as:  ZOFRAN Take 1 tablet (4 mg total) by mouth every 6 (six) hours as needed for nausea.   osimertinib mesylate 80 MG tablet Commonly known as:  TAGRISSO Take 1 tablet (80 mg total) by mouth daily.   predniSONE 5 MG tablet Commonly known as:  DELTASONE Take 1 tablet (5 mg total) by mouth daily with breakfast.   vitamin B-12 100 MCG tablet Commonly known as:  CYANOCOBALAMIN Take 1 tablet by mouth daily.       Allergies  Allergen Reactions  . Penicillins Palpitations and Other (See Comments)    Has patient had a PCN reaction causing immediate rash, facial/tongue/throat swelling, SOB or lightheadedness with hypotension: No Has patient had a PCN reaction causing severe rash involving mucus membranes or skin  necrosis: No Has patient had a PCN reaction that required hospitalization No Has patient had a PCN reaction occurring within the last 10 years: Yes If all of the above answers are "NO", then may proceed with Cephalosporin use.    Consultations:  IR   Procedures/Studies: Dg Chest 1 View  Result Date: 10/08/2018 CLINICAL DATA:  Status post left thoracentesis EXAM: CHEST  1 VIEW COMPARISON:  Chest radiograph 10/07/2018 FINDINGS: Slight decrease in size of left pleural effusion. No pneumothorax. Streaky opacities at the right lung apex are unchanged. IMPRESSION: Slight decrease in size of left pleural effusion. No pneumothorax. Electronically Signed   By: Ulyses Jarred M.D.   On: 10/08/2018 15:21   Dg Chest 2 View  Result Date: 10/07/2018 CLINICAL DATA:  Shortness of breath.  History of lung carcinoma EXAM: CHEST - 2 VIEW COMPARISON:  September 20, 2018 chest radiograph; chest CT September 05, 2018 FINDINGS: There remains a sizable partially loculated right pleural effusion with consolidation throughout the right mid and lower lung zones. Consolidation remains throughout the right upper lobe with volume loss the right apex. No new opacity is evident. Heart size and pulmonary vascularity are normal. No adenopathy. No bone lesions. IMPRESSION: Sizable left pleural effusion with consolidation throughout the left mid and lower lung zones. Consolidation with volume loss right upper lobe. Stable cardiac silhouette. No new opacity. Electronically Signed   By: Lowella Grip III M.D.   On: 10/07/2018 20:32   Dg Chest 2 View  Result Date: 09/20/2018 CLINICAL DATA:  72 year old female with a history of metastatic lung cancer and malignant effusion EXAM: CHEST - 2 VIEW COMPARISON:  09/08/2018, 09/07/2018, CT 09/05/2018 FINDINGS: Cardiomediastinal silhouette likely unchanged partially obscured by overlying lung and pleural disease. Persisting opacity at the left base obscuring the left hemidiaphragm, left  heart border, and pleuroparenchymal thickening. Platelike opacity of the right upper lung with bilateral patchy opacities of the lungs. Lateral view demonstrates meniscus. No displaced fracture. Redemonstration of expansive left third rib lesion, better seen on prior CT. IMPRESSION: Chest x-ray is unchanged from the comparison of 09/08/2018, with left-sided pleural effusion, partially loculated. Similar appearance of linear opacity at the right lung apex as well as multifocal interstitial/airspace opacities, with differential again including possible combination of chronic scarring, multifocal infection, and/or metastatic disease. Electronically Signed   By: Corrie Mckusick D.O.   On: 09/20/2018 15:30   US Thoracentesis Asp Pleural Space W/img Guide  Result Date: 10/08/2018 INDICATION: Patient with history of metastatic lung cancer, dyspnea, recurrent left pleural effusion. Request made for diagnostic and therapeutic left thoracentesis. EXAM: ULTRASOUND GUIDED DIAGNOSTIC AND THERAPEUTIC LEFT THORACENTESIS MEDICATIONS: None COMPLICATIONS: None immediate. PROCEDURE: An ultrasound guided thoracentesis was thoroughly discussed with the patient and questions answered. The benefits, risks, alternatives and complications were also discussed. The patient understands and wishes to proceed with the procedure. Written consent was obtained. Ultrasound was performed to localize and mark an adequate pocket of fluid in the left chest. The area was then prepped and draped in the normal sterile fashion. 1% Lidocaine was used for local anesthesia. Under ultrasound guidance a 6 Fr Safe-T-Centesis catheter was introduced. Thoracentesis was performed.  The catheter was removed and a dressing applied. FINDINGS: A total of approximately 700 cc of blood-tinged fluid was removed. Samples were sent to the laboratory as requested by the clinical team. Due to patient chest discomfort only the above amount of fluid was removed today.  IMPRESSION: Successful ultrasound guided diagnostic and therapeutic left thoracentesis yielding 700 cc of pleural fluid. Read by: Rowe Robert, PA-C Electronically Signed   By: Corrie Mckusick D.O.   On: 10/08/2018 14:58     Subjective: No complains  Discharge Exam: Vitals:   10/08/18 1402 10/08/18 1431  BP: (!) 146/59 130/68  Pulse:    Resp:    Temp:    SpO2:     Vitals:   10/08/18 0426 10/08/18 1201 10/08/18 1402 10/08/18 1431  BP: (!) 153/56 (!) 144/56 (!) 146/59 130/68  Pulse: 86 84    Resp: 14 (!) 24    Temp: (!) 97.3 F (36.3 C) 98.3 F (36.8 C)    TempSrc: Oral Oral    SpO2: 99% 100%    Weight:      Height:        General: Pt is alert, awake, not in acute distress Cardiovascular: RRR, S1/S2 +, no rubs, no gallops Respiratory: CTA bilaterally, no wheezing, no rhonchi Abdominal: Soft, NT, ND, bowel sounds + Extremities: no edema, no cyanosis    The results of significant diagnostics from this hospitalization (including imaging, microbiology, ancillary and laboratory) are listed below for reference.     Microbiology: No results found for this or any previous visit (from the past 240 hour(s)).   Labs: BNP (last 3 results) Recent Labs    09/04/18 2150  BNP 14.9   Basic Metabolic Panel: Recent Labs  Lab 10/07/18 2008 10/08/18 0556  NA 136 137  K 4.3 4.1  CL 104 106  CO2 25 26  GLUCOSE 102* 98  BUN 21 20  CREATININE 0.65 0.60  CALCIUM 8.6* 8.2*   Liver Function Tests: Recent Labs  Lab 10/07/18 2008  AST 28  ALT 18  ALKPHOS 104  BILITOT 0.6  PROT 6.4*  ALBUMIN 2.8*   No results for input(s): LIPASE, AMYLASE in the last 168 hours. No results for input(s): AMMONIA in the last 168 hours. CBC: Recent Labs  Lab 10/07/18 2008 10/08/18 0556  WBC 5.6 4.4  NEUTROABS 4.5  --   HGB 9.5* 8.7*  HCT 30.9* 28.7*  MCV 90.9 94.4  PLT 146* 105*   Cardiac Enzymes: No results for input(s): CKTOTAL, CKMB, CKMBINDEX, TROPONINI in the last 168  hours. BNP: Invalid input(s): POCBNP CBG: No results for input(s): GLUCAP in the last 168 hours. D-Dimer No results for input(s): DDIMER in the last 72 hours. Hgb A1c No results for input(s): HGBA1C in the last 72 hours. Lipid Profile No results for input(s): CHOL, HDL, LDLCALC, TRIG, CHOLHDL, LDLDIRECT in the last 72 hours. Thyroid function studies No results for input(s): TSH, T4TOTAL, T3FREE, THYROIDAB in the last 72 hours.  Invalid input(s): FREET3 Anemia work up No results for input(s): VITAMINB12, FOLATE, FERRITIN, TIBC, IRON, RETICCTPCT in the last 72 hours. Urinalysis    Component Value Date/Time   COLORURINE YELLOW 12/26/2016 1135   APPEARANCEUR HAZY (A) 12/26/2016 1135   LABSPEC 1.021 12/26/2016 1135   LABSPEC 1.010 10/10/2013 1442   PHURINE 5.0 12/26/2016 1135   GLUCOSEU NEGATIVE 12/26/2016 1135   GLUCOSEU Negative 10/10/2013 1442   HGBUR LARGE (A) 12/26/2016 [employer] 12/26/2016 1135   BILIRUBINUR Negative 10/10/2013 1442  KETONESUR NEGATIVE 12/26/2016 1135   PROTEINUR 30 (A) 09/26/2018 1522   UROBILINOGEN 0.2 10/10/2013 1442   NITRITE NEGATIVE 12/26/2016 1135   LEUKOCYTESUR MODERATE (A) 12/26/2016 1135   LEUKOCYTESUR Negative 10/10/2013 1442   Sepsis Labs Invalid input(s): PROCALCITONIN,  WBC,  LACTICIDVEN Microbiology No results found for this or any previous visit (from the past 240 hour(s)).   Time coordinating discharge: 40 minutes  SIGNED:   Charlynne Cousins, MD  [facility] 10/08/2018, 4:38 PM Pager   If 7PM-7AM, please contact night-coverage [email or web address] Password [email or web address]

## 2018-10-08 NOTE — Progress Notes (Signed)
[facility] PROGRESS NOTE    Progress Note  Jacqueline Leonard  TMH:[id number] DOB: Apr 10, 1946 DOA: 10/07/2018 PCP: Rutherford Guys, MD     Brief Narrative:   Jacqueline Leonard is an 72 y.o. female past medical history of metastatic adenocarcinoma of the lung presents with worsening shortness of breath which has progressively gotten worse over the last 2 weeks, with nonproductive cough denies any chest pain, fever chills nausea vomiting.  Assessment/Plan:   Acute on chronic respiratory failure with hypoxia likely due to Pleural effusion, left/  Metastatic adenocarcinoma, pathology 06/19/12: With a known history of metastatic lung cancer concern of about a malignant effusion. We have consulted IR for thoracocentesis and cytology but there is a second thoracocentesis.   Anemia in neoplastic disease: Likely due to chemotherapy, it seems to be stable.   Severe protein caloric malnutrition: Patient is cachectic and physical exam. RN Pressure Injury Documentation:    Estimated body mass index is 25.87 kg/m as calculated from the following:   Height as of this encounter: 5\' 1"  (1.549 m).   Weight as of this encounter: 62.1 kg. Malnutrition Type:      Malnutrition Characteristics:      Nutrition Interventions:       DVT prophylaxis: lovenxo Family Communication:husband Disposition Plan/Barrier to D/C: home in am Code Status:     Code Status Orders  (From admission, onward)         Start     Ordered   10/08/18 0208  Full code  Continuous     10/08/18 0208        Code Status History    Date Active Date Inactive Code Status Order ID Comments User Context   09/05/2018 0209 09/08/2018 1644 Full Code [id number]  Vianne Bulls, MD ED    Advance Directive Documentation     Most Recent Value  Type of Advance Directive  Healthcare Power of Attorney  Pre-existing out of facility DNR order (yellow form or pink MOST form)  -  "MOST" Form in Place?  -         IV Access:    Peripheral IV   Procedures and diagnostic studies:   Dg Chest 2 View  Result Date: 10/07/2018 CLINICAL DATA:  Shortness of breath.  History of lung carcinoma EXAM: CHEST - 2 VIEW COMPARISON:  September 20, 2018 chest radiograph; chest CT September 05, 2018 FINDINGS: There remains a sizable partially loculated right pleural effusion with consolidation throughout the right mid and lower lung zones. Consolidation remains throughout the right upper lobe with volume loss the right apex. No new opacity is evident. Heart size and pulmonary vascularity are normal. No adenopathy. No bone lesions. IMPRESSION: Sizable left pleural effusion with consolidation throughout the left mid and lower lung zones. Consolidation with volume loss right upper lobe. Stable cardiac silhouette. No new opacity. Electronically Signed   By: Lowella Grip III M.D.   On: 10/07/2018 20:32     Medical Consultants:    None.  Anti-Infectives:   None  Subjective:    Jacqueline Leonard no complaints feels great.  Objective:    Vitals:   10/07/18 2200 10/07/18 2243 10/07/18 2248 10/08/18 0426  BP: (!) 158/66 (!) 156/71  (!) 153/56  Pulse: 89 88  86  Resp: (!) 34 16  14  Temp:  98.2 F (36.8 C)  (!) 97.3 F (36.3 C)  TempSrc:  Oral  Oral  SpO2: 93% 96%  99%  Weight:   62.1 kg   Height:  5\' 1"  (1.549 m)     Intake/Output Summary (Last 24 hours) at 10/08/2018 0906 Last data filed at 10/08/2018 0600 Gross per 24 hour  Intake 120 ml  Output -  Net 120 ml   Filed Weights   10/07/18 1914 10/07/18 2248  Weight: 61 kg 62.1 kg    Exam: General exam: In no acute distress. Respiratory system: Good air movement and clear to auscultation. Cardiovascular system: S1 & S2 heard, RRR.  Gastrointestinal system: Abdomen is nondistended, soft and nontender.  Central nervous system: Alert and oriented. No focal neurological deficits. Extremities: No pedal edema. Skin: No rashes, lesions or  ulcers Psychiatry: Judgement and insight appear normal. Mood & affect appropriate.    Data Reviewed:    Labs: Basic Metabolic Panel: Recent Labs  Lab 10/07/18 2008 10/08/18 0556  NA 136 137  K 4.3 4.1  CL 104 106  CO2 25 26  GLUCOSE 102* 98  BUN 21 20  CREATININE 0.65 0.60  CALCIUM 8.6* 8.2*   GFR Estimated Creatinine Clearance: 52.9 mL/min (by C-G formula based on SCr of 0.6 mg/dL). Liver Function Tests: Recent Labs  Lab 10/07/18 2008  AST 28  ALT 18  ALKPHOS 104  BILITOT 0.6  PROT 6.4*  ALBUMIN 2.8*   No results for input(s): LIPASE, AMYLASE in the last 168 hours. No results for input(s): AMMONIA in the last 168 hours. Coagulation profile Recent Labs  Lab 10/08/18 0556  INR 1.14    CBC: Recent Labs  Lab 10/07/18 2008 10/08/18 0556  WBC 5.6 4.4  NEUTROABS 4.5  --   HGB 9.5* 8.7*  HCT 30.9* 28.7*  MCV 90.9 94.4  PLT 146* 105*   Cardiac Enzymes: No results for input(s): CKTOTAL, CKMB, CKMBINDEX, TROPONINI in the last 168 hours. BNP (last 3 results) No results for input(s): PROBNP in the last 8760 hours. CBG: No results for input(s): GLUCAP in the last 168 hours. D-Dimer: No results for input(s): DDIMER in the last 72 hours. Hgb A1c: No results for input(s): HGBA1C in the last 72 hours. Lipid Profile: No results for input(s): CHOL, HDL, LDLCALC, TRIG, CHOLHDL, LDLDIRECT in the last 72 hours. Thyroid function studies: No results for input(s): TSH, T4TOTAL, T3FREE, THYROIDAB in the last 72 hours.  Invalid input(s): FREET3 Anemia work up: No results for input(s): VITAMINB12, FOLATE, FERRITIN, TIBC, IRON, RETICCTPCT in the last 72 hours. Sepsis Labs: Recent Labs  Lab 10/07/18 2008 10/08/18 0556  WBC 5.6 4.4   Microbiology No results found for this or any previous visit (from the past 240 hour(s)).   Medications:   . cholecalciferol  1,000 Units Oral Daily  . escitalopram  10 mg Oral Daily  . mirtazapine  30 mg Oral QHS  . osimertinib  mesylate  80 mg Oral Daily  . predniSONE  5 mg Oral Q breakfast  . vitamin B-12  100 mcg Oral Daily   Continuous Infusions:    LOS: 0 days   Charlynne Cousins  [facility]   *Please refer to [email or web address], password [email or web address] to get updated schedule on who will round on this patient, as hospitalists switch teams weekly. If 7PM-7AM, please contact night-coverage at [email or web address], password [email or web address] for any overnight needs.  10/08/2018, 9:06 AM

## 2018-10-08 NOTE — Progress Notes (Signed)
Pt. Arrived on floor, [facility] admitting paged and made aware. Pt. SOB when arrived on the floor, O2 saturation 95% on room air. Pt. Requested O2 via nasal cannula for extra support. Lung sounds clear and diminished at bilateral lung bases. O2 saturation 98% when placed on 2 L O2. Will continue to monitor pt. Closely

## 2018-10-08 NOTE — H&P (Signed)
History and Physical    Raejean Swinford OVZ:[id number] DOB: 1946-02-14 DOA: 10/07/2018  PCP: Rutherford Guys, MD  Patient coming from: Home.  Chief Complaint: Loss of breath.  HPI: Jacqueline Leonard is a 72 y.o. female with history of metastatic adenocarcinoma of the lung presents with worsening shortness of breath which has been progressively worsening over the last 2 weeks.  Has been having nonproductive cough denies any chest pain fever or chills denies any nausea or vomiting.  Follows up with oncologist Dr. Julien Nordmann and is on [place].  ED Course: In the ER chest x-ray shows left-sided pleural effusion and patient was hypoxic.  Patient admitted for further management of acute hypoxic respiratory failure with because of worsening pleural effusion.  Does show some consolidation but patient is afebrile does not have any productive cough.  Review of Systems: As per HPI, rest all negative.   Past Medical History:  Diagnosis Date  . Allergy   . Anxiety   . External hemorrhoids   . Family history of breast cancer   . Family history of lung cancer   . Hyperlipidemia   . Hypertension   . Lung cancer metastatic to bone ([place]) 06/19/2012   bx=L 5th ribmetastatic Adenocarcinoma with known lung mass  . Radiation 07/11/12-07/24/12   Palliative lung tx 30 gray in 10 fx  . S/P radiation therapy 07/22/14-07/31/14   left lung/60gy/56fx  . Status post chemoradiation    Tarceva  . Vitamin D deficiency     Past Surgical History:  Procedure Laterality Date  . APPENDECTOMY     done at time of ovarian surgery, not ruptured.  . OVARIAN CYST REMOVAL     twice - 1970's and 1980's  . SOFT TISSUE BIOPSY  06/19/12   L 5th rib=metastatic adenocarcinoma     reports that she has never smoked. She has never used smokeless tobacco. She reports that she does not drink alcohol or use drugs.  Allergies  Allergen Reactions  . Penicillins Palpitations and Other (See Comments)    Has patient had a PCN reaction  causing immediate rash, facial/tongue/throat swelling, SOB or lightheadedness with hypotension: No Has patient had a PCN reaction causing severe rash involving mucus membranes or skin necrosis: No Has patient had a PCN reaction that required hospitalization No Has patient had a PCN reaction occurring within the last 10 years: Yes If all of the above answers are "NO", then may proceed with Cephalosporin use.    Family History  Problem Relation Age of Onset  . Lung cancer Brother   . Breast cancer Sister        dx early 82s, d 56  . Breast cancer Sister 20       d 75s    Prior to Admission medications   Medication Sig Start Date End Date Taking? Authorizing Provider  cholecalciferol (VITAMIN D) 1000 UNITS tablet Take 1,000 Units by mouth daily.   Yes [provider]  escitalopram (LEXAPRO) 10 MG tablet Take 1 tablet (10 mg total) by mouth daily. 09/08/18  Yes Georgette Shell, MD  mirtazapine (REMERON) 30 MG tablet Take 1 tablet (30 mg total) by mouth at bedtime. 08/21/18  Yes Rutherford Guys, MD  ondansetron (ZOFRAN) 4 MG tablet Take 1 tablet (4 mg total) by mouth every 6 (six) hours as needed for nausea. 09/07/18  Yes Georgette Shell, MD  osimertinib mesylate (TAGRISSO) 80 MG tablet Take 1 tablet (80 mg total) by mouth daily. 08/12/18  Yes Mikey Bussing  R, NP  Phenyleph-Doxylamine-DM-APAP (NYQUIL SEVERE COLD/FLU PO) Take 5 mLs by mouth daily as needed (cough). Once at bedtime    Yes [provider]  predniSONE (DELTASONE) 5 MG tablet Take 1 tablet (5 mg total) by mouth daily with breakfast. 09/08/18  Yes Georgette Shell, MD  vitamin B-12 (CYANOCOBALAMIN) 100 MCG tablet Take 1 tablet by mouth daily.   Yes [provider]    Physical Exam: Vitals:   10/07/18 2030 10/07/18 2200 10/07/18 2243 10/07/18 2248  BP: (!) 165/65 (!) 158/66 (!) 156/71   Pulse: 85 89 88   Resp: (!) 32 (!) 34 16   Temp:   98.2 F (36.8 C)   TempSrc:   Oral   SpO2: 94%  93% 96%   Weight:    62.1 kg  Height:    5\' 1"  (1.549 m)      Constitutional: Moderately built and nourished. Vitals:   10/07/18 2030 10/07/18 2200 10/07/18 2243 10/07/18 2248  BP: (!) 165/65 (!) 158/66 (!) 156/71   Pulse: 85 89 88   Resp: (!) 32 (!) 34 16   Temp:   98.2 F (36.8 C)   TempSrc:   Oral   SpO2: 94% 93% 96%   Weight:    62.1 kg  Height:    5\' 1"  (1.549 m)   Eyes: Anicteric no pallor. ENMT: No discharge from the ears eyes nose or mouth. Neck: No mass felt.  No neck rigidity. Respiratory: No rhonchi or crepitations. Cardiovascular: S1-S2 heard. Abdomen: Soft nontender bowel sounds present. Musculoskeletal: No edema.  No joint effusion. Skin: No rash. Neurologic: Alert awake oriented to time place and person.  Moves all extremities. Psychiatric: Appears normal per normal affect.   Labs on Admission: I have personally reviewed following labs and imaging studies  CBC: Recent Labs  Lab 10/07/18 2008  WBC 5.6  NEUTROABS 4.5  HGB 9.5*  HCT 30.9*  MCV 90.9  PLT 595*   Basic Metabolic Panel: Recent Labs  Lab 10/07/18 2008  NA 136  K 4.3  CL 104  CO2 25  GLUCOSE 102*  BUN 21  CREATININE 0.65  CALCIUM 8.6*   GFR: Estimated Creatinine Clearance: 52.9 mL/min (by C-G formula based on SCr of 0.65 mg/dL). Liver Function Tests: Recent Labs  Lab 10/07/18 2008  AST 28  ALT 18  ALKPHOS 104  BILITOT 0.6  PROT 6.4*  ALBUMIN 2.8*   No results for input(s): LIPASE, AMYLASE in the last 168 hours. No results for input(s): AMMONIA in the last 168 hours. Coagulation Profile: No results for input(s): INR, PROTIME in the last 168 hours. Cardiac Enzymes: No results for input(s): CKTOTAL, CKMB, CKMBINDEX, TROPONINI in the last 168 hours. BNP (last 3 results) No results for input(s): PROBNP in the last 8760 hours. HbA1C: No results for input(s): HGBA1C in the last 72 hours. CBG: No results for input(s): GLUCAP in the last 168 hours. Lipid Profile: No  results for input(s): CHOL, HDL, LDLCALC, TRIG, CHOLHDL, LDLDIRECT in the last 72 hours. Thyroid Function Tests: No results for input(s): TSH, T4TOTAL, FREET4, T3FREE, THYROIDAB in the last 72 hours. Anemia Panel: No results for input(s): VITAMINB12, FOLATE, FERRITIN, TIBC, IRON, RETICCTPCT in the last 72 hours. Urine analysis:    Component Value Date/Time   COLORURINE YELLOW 12/26/2016 1135   APPEARANCEUR HAZY (A) 12/26/2016 1135   LABSPEC 1.021 12/26/2016 1135   LABSPEC 1.010 10/10/2013 1442   PHURINE 5.0 12/26/2016 1135   GLUCOSEU NEGATIVE 12/26/2016 1135  GLUCOSEU Negative 10/10/2013 1442   HGBUR LARGE (A) 12/26/2016 1135   BILIRUBINUR NEGATIVE 12/26/2016 1135   BILIRUBINUR Negative 10/10/2013 1442   KETONESUR NEGATIVE 12/26/2016 1135   PROTEINUR 30 (A) 09/26/2018 1522   UROBILINOGEN 0.2 10/10/2013 1442   NITRITE NEGATIVE 12/26/2016 1135   LEUKOCYTESUR MODERATE (A) 12/26/2016 1135   LEUKOCYTESUR Negative 10/10/2013 1442   Sepsis Labs: @LABRCNTIP (procalcitonin:4,lacticidven:4) )No results found for this or any previous visit (from the past 240 hour(s)).   Radiological Exams on Admission: Dg Chest 2 View  Result Date: 10/07/2018 CLINICAL DATA:  Shortness of breath.  History of lung carcinoma EXAM: CHEST - 2 VIEW COMPARISON:  September 20, 2018 chest radiograph; chest CT September 05, 2018 FINDINGS: There remains a sizable partially loculated right pleural effusion with consolidation throughout the right mid and lower lung zones. Consolidation remains throughout the right upper lobe with volume loss the right apex. No new opacity is evident. Heart size and pulmonary vascularity are normal. No adenopathy. No bone lesions. IMPRESSION: Sizable left pleural effusion with consolidation throughout the left mid and lower lung zones. Consolidation with volume loss right upper lobe. Stable cardiac silhouette. No new opacity. Electronically Signed   By: Lowella Grip III M.D.   On:  10/07/2018 20:32    EKG: Independently reviewed.  Normal sinus rhythm.  Assessment/Plan Principal Problem:   Pleural effusion, left Active Problems:   Metastatic adenocarcinoma, pathology 06/19/12   Anemia in neoplastic disease   Adenocarcinoma of lung, stage 4, left (HCC)   Pleural effusion   Dyspnea    1. Acute respiratory failure with hypoxia likely from worsening pleural effusion with known history of metastatic lung cancer -I have ordered ultrasound-guided thoracentesis with labs.  This is a second thoracentesis within last 1 month.  Patient likely may need Pleurx catheter but will need to discuss with patient's oncologist. 2. Anemia likely secondary to cancer and body chemotherapy follow CBC. 3. Elevated blood pressure with history of hypertension presently not on medication closely follow blood pressure trends.   DVT prophylaxis: SCDs in anticipation of procedure. Code Status: Full code. Family Communication: Discussed with patient. Disposition Plan: Home. Consults called: None. Admission status: Observation.   Rise Patience MD [facility] Pager [phone].  If 7PM-7AM, please contact night-coverage [email or web address] Password [email or web address]  10/08/2018, 2:09 AM

## 2018-10-08 NOTE — Procedures (Signed)
Ultrasound-guided diagnostic and therapeutic left thoracentesis performed yielding 700 cc of blood-tinged fluid. No immediate complications. Follow-up chest x-ray pending. Due to pt chest discomfort only the above amount of fluid was removed today. The fluid was sent to the lab for preordered studies.

## 2018-10-08 NOTE — Progress Notes (Signed)
Admitting MD Hal Hope paged and made aware patient arrived on floor from ED at 2245. Awaiting orders. Pt. Comfortable in bed. Will continue to monitor pt. Closely.

## 2018-10-09 LAB — PH, BODY FLUID: pH, Body Fluid: 7.6

## 2018-10-09 LAB — PATHOLOGIST SMEAR REVIEW

## 2018-10-09 NOTE — Progress Notes (Signed)
Pt and Pt's family given discharge instructions and Medication reviewed with Pt and Pt's family. Understanding verbalized by pt and Family. Home Medication returned to Patient from Pharmacy. Pt discharged to home via wheelchair. Discharge AVS with Pt at time of discharge

## 2018-10-11 ENCOUNTER — Ambulatory Visit: Payer: Medicare Other | Admitting: Gynecologic Oncology

## 2018-10-13 LAB — CULTURE, BODY FLUID W GRAM STAIN -BOTTLE

## 2018-10-13 LAB — CULTURE, BODY FLUID-BOTTLE: CULTURE: NO GROWTH

## 2018-10-14 ENCOUNTER — Ambulatory Visit: Payer: Medicare Other | Admitting: Oncology

## 2018-10-15 ENCOUNTER — Inpatient Hospital Stay: Payer: Medicare Other | Attending: Internal Medicine

## 2018-10-15 ENCOUNTER — Other Ambulatory Visit: Payer: Self-pay | Admitting: *Deleted

## 2018-10-15 ENCOUNTER — Encounter: Payer: Self-pay | Admitting: Internal Medicine

## 2018-10-15 ENCOUNTER — Inpatient Hospital Stay (HOSPITAL_BASED_OUTPATIENT_CLINIC_OR_DEPARTMENT_OTHER): Payer: Medicare Other | Admitting: Internal Medicine

## 2018-10-15 ENCOUNTER — Inpatient Hospital Stay: Payer: Medicare Other

## 2018-10-15 VITALS — BP 141/54 | HR 122 | Temp 98.2°F | Resp 20 | Ht 61.0 in | Wt 136.3 lb

## 2018-10-15 DIAGNOSIS — C7952 Secondary malignant neoplasm of bone marrow: Principal | ICD-10-CM

## 2018-10-15 DIAGNOSIS — C799 Secondary malignant neoplasm of unspecified site: Secondary | ICD-10-CM

## 2018-10-15 DIAGNOSIS — Z5112 Encounter for antineoplastic immunotherapy: Secondary | ICD-10-CM | POA: Diagnosis not present

## 2018-10-15 DIAGNOSIS — C3492 Malignant neoplasm of unspecified part of left bronchus or lung: Secondary | ICD-10-CM

## 2018-10-15 DIAGNOSIS — C7951 Secondary malignant neoplasm of bone: Secondary | ICD-10-CM

## 2018-10-15 DIAGNOSIS — J90 Pleural effusion, not elsewhere classified: Secondary | ICD-10-CM

## 2018-10-15 DIAGNOSIS — R630 Anorexia: Secondary | ICD-10-CM | POA: Diagnosis not present

## 2018-10-15 DIAGNOSIS — E440 Moderate protein-calorie malnutrition: Secondary | ICD-10-CM

## 2018-10-15 DIAGNOSIS — C3490 Malignant neoplasm of unspecified part of unspecified bronchus or lung: Secondary | ICD-10-CM

## 2018-10-15 DIAGNOSIS — Z5111 Encounter for antineoplastic chemotherapy: Secondary | ICD-10-CM

## 2018-10-15 LAB — CBC WITH DIFFERENTIAL (CANCER CENTER ONLY)
Abs Immature Granulocytes: 0.03 10*3/uL (ref 0.00–0.07)
BASOS PCT: 0 %
Basophils Absolute: 0 10*3/uL (ref 0.0–0.1)
EOS ABS: 0.1 10*3/uL (ref 0.0–0.5)
EOS PCT: 2 %
HCT: 32.1 % — ABNORMAL LOW (ref 36.0–46.0)
Hemoglobin: 10 g/dL — ABNORMAL LOW (ref 12.0–15.0)
Immature Granulocytes: 1 %
Lymphocytes Relative: 10 %
Lymphs Abs: 0.6 10*3/uL — ABNORMAL LOW (ref 0.7–4.0)
MCH: 28.2 pg (ref 26.0–34.0)
MCHC: 31.2 g/dL (ref 30.0–36.0)
MCV: 90.7 fL (ref 80.0–100.0)
MONO ABS: 0.6 10*3/uL (ref 0.1–1.0)
Monocytes Relative: 10 %
NEUTROS ABS: 4.6 10*3/uL (ref 1.7–7.7)
Neutrophils Relative %: 77 %
Platelet Count: 179 10*3/uL (ref 150–400)
RBC: 3.54 MIL/uL — ABNORMAL LOW (ref 3.87–5.11)
RDW: 16.6 % — ABNORMAL HIGH (ref 11.5–15.5)
WBC: 6 10*3/uL (ref 4.0–10.5)
nRBC: 0 % (ref 0.0–0.2)

## 2018-10-15 LAB — CMP (CANCER CENTER ONLY)
ALT: 16 U/L (ref 0–44)
AST: 24 U/L (ref 15–41)
Albumin: 2.5 g/dL — ABNORMAL LOW (ref 3.5–5.0)
Alkaline Phosphatase: 141 U/L — ABNORMAL HIGH (ref 38–126)
Anion gap: 11 (ref 5–15)
BILIRUBIN TOTAL: 0.7 mg/dL (ref 0.3–1.2)
BUN: 24 mg/dL — AB (ref 8–23)
CO2: 25 mmol/L (ref 22–32)
CREATININE: 0.81 mg/dL (ref 0.44–1.00)
Calcium: 8.7 mg/dL — ABNORMAL LOW (ref 8.9–10.3)
Chloride: 102 mmol/L (ref 98–111)
GFR, Est AFR Am: >60 mL/min (ref >60)
Glucose, Bld: 97 mg/dL (ref 70–99)
POTASSIUM: 4.4 mmol/L (ref 3.5–5.1)
Sodium: 138 mmol/L (ref 135–145)
TOTAL PROTEIN: 6.3 g/dL — AB (ref 6.5–8.1)

## 2018-10-15 LAB — TOTAL PROTEIN, URINE DIPSTICK: PROTEIN: 30 mg/dL — AB

## 2018-10-15 MED ORDER — METHYLPREDNISOLONE 4 MG PO TBPK: ORAL_TABLET | ORAL | 0 refills | Status: DC

## 2018-10-15 NOTE — Progress Notes (Signed)
[place] Telephone:([phone]   Fax:([phone]  OFFICE PROGRESS NOTE  Rutherford Guys, MD [address] [place] [address]  DIAGNOSIS: Metastatic non-small cell lung cancer, adenocarcinoma with positive EGFR mutation in exon 19 and development of resistant T790M mutation. This was initially diagnosed in July 2013.  PRIOR THERAPY: 1) Status post radiotherapy to the left face rib metastasis under the care of Dr. Lisbeth Renshaw.  2) stereotactic radiotherapy to the enlarging left lower lobe lung nodule under the care of Dr. Lisbeth Renshaw. 3) treatment with Tarceva 150 mg by mouth daily, therapy beginning 07/20/2012. Status post approximately 36 months of therapy. This was discontinued secondary to disease progression. 4) stereotactic radiotherapy to enlarging pulmonary nodules under the care of Dr. Lisbeth Renshaw.  CURRENT THERAPY: 1) Tagrisso 80 mg by mouth daily started 02/03/2016 status post more than 30 months of treatment.  This is now in addition to Cyramza 10 mg/KG every 2 weeks status post 1 cycle. 2) Xgeva 120 mg subcutaneously every 4 weeks for bone metastasis.  INTERVAL HISTORY: Jacqueline Leonard 72 y.o. female returns to the clinic today for follow-up visit accompanied by her husband.  The patient is feeling fine today with no specific complaints except for fatigue.  She also has shortness of breath with exertion.  She was recently admitted to [facility] with shortness of breath and she underwent ultrasound-guided left thoracentesis with drainage of 700 cc of pleural fluid.  The patient denied having any current chest pain but continues to have dry cough with no hemoptysis.  She denied having any recent weight loss or night sweats.  She has no nausea, vomiting, diarrhea or constipation.  She continues to have lack of appetite.  She is here today for evaluation before starting cycle #2.  MEDICAL HISTORY: Past Medical History:  Diagnosis Date  . Allergy   . Anxiety     . External hemorrhoids   . Family history of breast cancer   . Family history of lung cancer   . Hyperlipidemia   . Hypertension   . Lung cancer metastatic to bone ([place]) 06/19/2012   bx=L 5th ribmetastatic Adenocarcinoma with known lung mass  . Radiation 07/11/12-07/24/12   Palliative lung tx 30 gray in 10 fx  . S/P radiation therapy 07/22/14-07/31/14   left lung/60gy/83f  . Status post chemoradiation    Tarceva  . Vitamin D deficiency     ALLERGIES:  is allergic to penicillins.  MEDICATIONS:  Current Outpatient Medications  Medication Sig Dispense Refill  . cholecalciferol (VITAMIN D) 1000 UNITS tablet Take 1,000 Units by mouth daily.    .Marland Kitchenescitalopram (LEXAPRO) 10 MG tablet Take 1 tablet (10 mg total) by mouth daily. 30 tablet 0  . mirtazapine (REMERON) 30 MG tablet Take 1 tablet (30 mg total) by mouth at bedtime. 30 tablet 2  . ondansetron (ZOFRAN) 4 MG tablet Take 1 tablet (4 mg total) by mouth every 6 (six) hours as needed for nausea. 20 tablet 0  . osimertinib mesylate (TAGRISSO) 80 MG tablet Take 1 tablet (80 mg total) by mouth daily. 30 tablet 2  . Phenyleph-Doxylamine-DM-APAP (NYQUIL SEVERE COLD/FLU PO) Take 5 mLs by mouth daily as needed (cough). Once at bedtime     . predniSONE (DELTASONE) 5 MG tablet Take 1 tablet (5 mg total) by mouth daily with breakfast. 20 tablet 0  . vitamin B-12 (CYANOCOBALAMIN) 100 MCG tablet Take 1 tablet by mouth daily.     No current facility-administered medications  for this visit.     SURGICAL HISTORY:  Past Surgical History:  Procedure Laterality Date  . APPENDECTOMY     done at time of ovarian surgery, not ruptured.  . OVARIAN CYST REMOVAL     twice - 1970's and 1980's  . SOFT TISSUE BIOPSY  06/19/12   L 5th rib=metastatic adenocarcinoma    REVIEW OF SYSTEMS:  A comprehensive review of systems was negative except for: Constitutional: positive for anorexia and fatigue Respiratory: positive for cough and dyspnea on exertion    PHYSICAL EXAMINATION: General appearance: alert, cooperative, fatigued and no distress Head: Normocephalic, without obvious abnormality, atraumatic Neck: no adenopathy, no JVD, supple, symmetrical, trachea midline and thyroid not enlarged, symmetric, no tenderness/mass/nodules Lymph nodes: Cervical, supraclavicular, and axillary nodes normal. Resp: clear to auscultation bilaterally Back: symmetric, no curvature. ROM normal. No CVA tenderness. Cardio: regular rate and rhythm, S1, S2 normal, no murmur, click, rub or gallop GI: soft, non-tender; bowel sounds normal; no masses,  no organomegaly Extremities: extremities normal, atraumatic, no cyanosis or edema  ECOG PERFORMANCE STATUS: 1 - Symptomatic but completely ambulatory  Blood pressure (!) 141/54, pulse (!) 122, temperature 98.2 F (36.8 C), temperature source Oral, resp. rate 20, height 5' 1"  (1.549 m), weight 136 lb 4.8 oz (61.8 kg), SpO2 95 %.  LABORATORY DATA: Lab Results  Component Value Date   WBC 6.0 10/15/2018   HGB 10.0 (L) 10/15/2018   HCT 32.1 (L) 10/15/2018   MCV 90.7 10/15/2018   PLT 179 10/15/2018      Chemistry      Component Value Date/Time   NA 138 10/15/2018 1223   NA 140 11/01/2017 1423   K 4.4 10/15/2018 1223   K 3.7 11/01/2017 1423   CL 102 10/15/2018 1223   CL 102 05/06/2013 1511   CO2 25 10/15/2018 1223   CO2 25 11/01/2017 1423   BUN 24 (H) 10/15/2018 1223   BUN 21.2 11/01/2017 1423   CREATININE 0.81 10/15/2018 1223   CREATININE 1.0 11/01/2017 1423      Component Value Date/Time   CALCIUM 8.7 (L) 10/15/2018 1223   CALCIUM 9.3 11/01/2017 1423   ALKPHOS 141 (H) 10/15/2018 1223   ALKPHOS 81 11/01/2017 1423   AST 24 10/15/2018 1223   AST 30 11/01/2017 1423   ALT 16 10/15/2018 1223   ALT 21 11/01/2017 1423   BILITOT 0.7 10/15/2018 1223   BILITOT 0.45 11/01/2017 1423       RADIOGRAPHIC STUDIES: Dg Chest 1 View  Result Date: 10/08/2018 CLINICAL DATA:  Status post left thoracentesis  EXAM: CHEST  1 VIEW COMPARISON:  Chest radiograph 10/07/2018 FINDINGS: Slight decrease in size of left pleural effusion. No pneumothorax. Streaky opacities at the right lung apex are unchanged. IMPRESSION: Slight decrease in size of left pleural effusion. No pneumothorax. Electronically Signed   By: Ulyses Jarred M.D.   On: 10/08/2018 15:21   Dg Chest 2 View  Result Date: 10/07/2018 CLINICAL DATA:  Shortness of breath.  History of lung carcinoma EXAM: CHEST - 2 VIEW COMPARISON:  September 20, 2018 chest radiograph; chest CT September 05, 2018 FINDINGS: There remains a sizable partially loculated right pleural effusion with consolidation throughout the right mid and lower lung zones. Consolidation remains throughout the right upper lobe with volume loss the right apex. No new opacity is evident. Heart size and pulmonary vascularity are normal. No adenopathy. No bone lesions. IMPRESSION: Sizable left pleural effusion with consolidation throughout the left mid and lower lung zones. Consolidation with  volume loss right upper lobe. Stable cardiac silhouette. No new opacity. Electronically Signed   By: Lowella Grip III M.D.   On: 10/07/2018 20:32   Dg Chest 2 View  Result Date: 09/20/2018 CLINICAL DATA:  72 year old female with a history of metastatic lung cancer and malignant effusion EXAM: CHEST - 2 VIEW COMPARISON:  09/08/2018, 09/07/2018, CT 09/05/2018 FINDINGS: Cardiomediastinal silhouette likely unchanged partially obscured by overlying lung and pleural disease. Persisting opacity at the left base obscuring the left hemidiaphragm, left heart border, and pleuroparenchymal thickening. Platelike opacity of the right upper lung with bilateral patchy opacities of the lungs. Lateral view demonstrates meniscus. No displaced fracture. Redemonstration of expansive left third rib lesion, better seen on prior CT. IMPRESSION: Chest x-ray is unchanged from the comparison of 09/08/2018, with left-sided pleural  effusion, partially loculated. Similar appearance of linear opacity at the right lung apex as well as multifocal interstitial/airspace opacities, with differential again including possible combination of chronic scarring, multifocal infection, and/or metastatic disease. Electronically Signed   By: Corrie Mckusick D.O.   On: 09/20/2018 15:30   US Thoracentesis Asp Pleural Space W/img Guide  Result Date: 10/08/2018 INDICATION: Patient with history of metastatic lung cancer, dyspnea, recurrent left pleural effusion. Request made for diagnostic and therapeutic left thoracentesis. EXAM: ULTRASOUND GUIDED DIAGNOSTIC AND THERAPEUTIC LEFT THORACENTESIS MEDICATIONS: None COMPLICATIONS: None immediate. PROCEDURE: An ultrasound guided thoracentesis was thoroughly discussed with the patient and questions answered. The benefits, risks, alternatives and complications were also discussed. The patient understands and wishes to proceed with the procedure. Written consent was obtained. Ultrasound was performed to localize and mark an adequate pocket of fluid in the left chest. The area was then prepped and draped in the normal sterile fashion. 1% Lidocaine was used for local anesthesia. Under ultrasound guidance a 6 Fr Safe-T-Centesis catheter was introduced. Thoracentesis was performed. The catheter was removed and a dressing applied. FINDINGS: A total of approximately 700 cc of blood-tinged fluid was removed. Samples were sent to the laboratory as requested by the clinical team. Due to patient chest discomfort only the above amount of fluid was removed today. IMPRESSION: Successful ultrasound guided diagnostic and therapeutic left thoracentesis yielding 700 cc of pleural fluid. Read by: Rowe Robert, PA-C Electronically Signed   By: Corrie Mckusick D.O.   On: 10/08/2018 14:58    ASSESSMENT AND PLAN:  This is a very pleasant 72 years old Hispanic female with a stage IV non-small cell lung cancer, adenocarcinoma with positive  EGFR mutation with deletion in exon 19 diagnosed in July 2013 status post 3 years treatment with Tarceva discontinued secondary to disease progression and development of T7 3 M EGFR resistant mutation. The patient was started on treatment with Tagrisso 80 mg by mouth daily status post 30 months.  She also received palliative radiotherapy to the progressive pulmonary nodules. Molecular studies by [employer] showed the development of new resistant mutation [id number].   Unfortunately there is no target option for the new mutation. She had evidence for disease progression and the patient is currently undergoing treatment with Tagrisso in addition to Cyramza 10 mg/KG every 2 weeks status post 1 cycle. She tolerated the first cycle of her treatment well with no concerning adverse effects. I recommended for her to proceed with cycle #2 today as a schedule. For the lack of appetite and weight loss, I will start the patient on Medrol Dosepak.  She will also continue on Remeron 30 mg p.o. Nightly. I will see her back for follow-up  visit in 2 weeks for evaluation.  The patient will have repeat CT scan of the chest, abdomen and pelvis in around 6 weeks from now. She was advised to call immediately if she has any concerning symptoms in the interval. All questions were answered. The patient knows to call the clinic with any problems, questions or concerns. We can certainly see the patient much sooner if necessary.  Disclaimer: This note was dictated with voice recognition software. Similar sounding words can inadvertently be transcribed and may not be corrected upon review.

## 2018-10-15 NOTE — Progress Notes (Signed)
Today's tx cancelled r/t venous access. Port placement scheduled for 1330 Friday, 10/18/18. Pt notified by Karen Chafe., RN.

## 2018-10-17 ENCOUNTER — Telehealth: Payer: Self-pay | Admitting: Internal Medicine

## 2018-10-17 ENCOUNTER — Other Ambulatory Visit: Payer: Self-pay | Admitting: Student

## 2018-10-17 NOTE — Telephone Encounter (Signed)
Scheduled appt per 11/19 los - patient to get an updated schedule next visit.

## 2018-10-18 ENCOUNTER — Other Ambulatory Visit: Payer: Self-pay

## 2018-10-18 ENCOUNTER — Ambulatory Visit (HOSPITAL_COMMUNITY)
Admission: RE | Admit: 2018-10-18 | Discharge: 2018-10-18 | Disposition: A | Discharge status: Home / Self Care | Payer: Medicare Other | Source: Ambulatory Visit | Attending: Internal Medicine | Admitting: Internal Medicine

## 2018-10-18 ENCOUNTER — Encounter (HOSPITAL_COMMUNITY): Payer: Self-pay

## 2018-10-18 ENCOUNTER — Other Ambulatory Visit: Payer: Self-pay | Admitting: Internal Medicine

## 2018-10-18 DIAGNOSIS — Z880 Allergy status to penicillin: Secondary | ICD-10-CM | POA: Diagnosis not present

## 2018-10-18 DIAGNOSIS — R600 Localized edema: Secondary | ICD-10-CM | POA: Diagnosis not present

## 2018-10-18 DIAGNOSIS — Z9889 Other specified postprocedural states: Secondary | ICD-10-CM | POA: Insufficient documentation

## 2018-10-18 DIAGNOSIS — Z452 Encounter for adjustment and management of vascular access device: Secondary | ICD-10-CM | POA: Diagnosis not present

## 2018-10-18 DIAGNOSIS — C7802 Secondary malignant neoplasm of left lung: Secondary | ICD-10-CM | POA: Diagnosis not present

## 2018-10-18 DIAGNOSIS — Z79899 Other long term (current) drug therapy: Secondary | ICD-10-CM | POA: Diagnosis not present

## 2018-10-18 DIAGNOSIS — C799 Secondary malignant neoplasm of unspecified site: Secondary | ICD-10-CM

## 2018-10-18 DIAGNOSIS — C3490 Malignant neoplasm of unspecified part of unspecified bronchus or lung: Secondary | ICD-10-CM | POA: Diagnosis not present

## 2018-10-18 HISTORY — PX: IR IMAGING GUIDED PORT INSERTION: IMG5740

## 2018-10-18 LAB — CBC
HCT: 32.7 % — ABNORMAL LOW (ref 36.0–46.0)
HEMOGLOBIN: 9.8 g/dL — AB (ref 12.0–15.0)
MCH: 28.5 pg (ref 26.0–34.0)
MCHC: 30 g/dL (ref 30.0–36.0)
MCV: 95.1 fL (ref 80.0–100.0)
Platelets: 201 10*3/uL (ref 150–400)
RBC: 3.44 MIL/uL — AB (ref 3.87–5.11)
RDW: 16.8 % — ABNORMAL HIGH (ref 11.5–15.5)
WBC: 5.6 10*3/uL (ref 4.0–10.5)
nRBC: 0 % (ref 0.0–0.2)

## 2018-10-18 LAB — PROTIME-INR
INR: 0.99
Prothrombin Time: 13 seconds (ref 11.4–15.2)

## 2018-10-18 LAB — APTT: aPTT: 29 seconds (ref 24–36)

## 2018-10-18 MED ORDER — VANCOMYCIN HCL IN DEXTROSE 1-5 GM/200ML-% IV SOLN
1000.0000 mg | Freq: Once | INTRAVENOUS | Status: AC
  Administered 2018-10-18: 1000 mg via INTRAVENOUS

## 2018-10-18 MED ORDER — LIDOCAINE HCL (PF) 1 % IJ SOLN
INTRAMUSCULAR | Status: AC | PRN
  Administered 2018-10-18: 5 mL
  Administered 2018-10-18: 10 mL

## 2018-10-18 MED ORDER — VANCOMYCIN HCL IN DEXTROSE 1-5 GM/200ML-% IV SOLN
INTRAVENOUS | Status: AC
  Filled 2018-10-18: qty 200

## 2018-10-18 MED ORDER — FENTANYL CITRATE (PF) 100 MCG/2ML IJ SOLN
INTRAMUSCULAR | Status: AC
  Filled 2018-10-18: qty 2

## 2018-10-18 MED ORDER — SODIUM CHLORIDE 0.9 % IV SOLN: INTRAVENOUS | Status: DC

## 2018-10-18 MED ORDER — MIDAZOLAM HCL 2 MG/2ML IJ SOLN
INTRAMUSCULAR | Status: AC | PRN
  Administered 2018-10-18 (×2): 1 mg via INTRAVENOUS

## 2018-10-18 MED ORDER — FENTANYL CITRATE (PF) 100 MCG/2ML IJ SOLN
INTRAMUSCULAR | Status: AC | PRN
  Administered 2018-10-18 (×2): 50 ug via INTRAVENOUS

## 2018-10-18 MED ORDER — LIDOCAINE HCL 1 % IJ SOLN
INTRAMUSCULAR | Status: AC
  Filled 2018-10-18: qty 40

## 2018-10-18 MED ORDER — MIDAZOLAM HCL 2 MG/2ML IJ SOLN
INTRAMUSCULAR | Status: AC
  Filled 2018-10-18: qty 4

## 2018-10-18 MED ORDER — HEPARIN SOD (PORK) LOCK FLUSH 100 UNIT/ML IV SOLN
INTRAVENOUS | Status: AC
  Filled 2018-10-18: qty 5

## 2018-10-18 NOTE — H&P (Signed)
Referring Physician(s): Mohamed,Mohamed  Supervising Physician: Aletta Edouard  Patient Status:  [facility]  Chief Complaint:  "I'm getting a port a cath"  Subjective: Patient familiar to IR service from prior left fourth rib mass biopsy in 2013 as well as multiple left thoracenteses, latest on 11/12 yielding 700 cc.  She has a history of stage IV metastatic lung adenocarcinoma, initially diagnosed in 2013, status post chemoradiation.  She now has evidence of disease progression as well as poor venous access.  She presents today for Port-A-Cath placement for additional treatment.  Currently denies fever, headache, chest pain, back pain, nausea, vomiting or bleeding.  She does have dyspnea, occasional cough, abdominal distention, lower extremity swelling.  Past Surgical History:  Procedure Laterality Date  . APPENDECTOMY     done at time of ovarian surgery, not ruptured.  . OVARIAN CYST REMOVAL     twice - 1970's and 1980's  . SOFT TISSUE BIOPSY  06/19/12   L 5th rib=metastatic adenocarcinoma   Past Surgical History:  Procedure Laterality Date  . APPENDECTOMY     done at time of ovarian surgery, not ruptured.  . OVARIAN CYST REMOVAL     twice - 1970's and 1980's  . SOFT TISSUE BIOPSY  06/19/12   L 5th rib=metastatic adenocarcinoma     Allergies: Penicillins  Medications: Prior to Admission medications   Medication Sig Start Date End Date Taking? Authorizing Provider  cholecalciferol (VITAMIN D) 1000 UNITS tablet Take 1,000 Units by mouth daily.    [provider]  escitalopram (LEXAPRO) 10 MG tablet Take 1 tablet (10 mg total) by mouth daily. 09/08/18   Georgette Shell, MD  methylPREDNISolone (MEDROL DOSEPAK) 4 MG TBPK tablet Use as instructed. 10/15/18   Curt Bears, MD  mirtazapine (REMERON) 30 MG tablet Take 1 tablet (30 mg total) by mouth at bedtime. 08/21/18   Rutherford Guys, MD  ondansetron (ZOFRAN) 4 MG tablet Take 1 tablet (4 mg total) by mouth  every 6 (six) hours as needed for nausea. 09/07/18   Georgette Shell, MD  osimertinib mesylate (TAGRISSO) 80 MG tablet Take 1 tablet (80 mg total) by mouth daily. 08/12/18   Maryanna Shape, NP  Phenyleph-Doxylamine-DM-APAP (NYQUIL SEVERE COLD/FLU PO) Take 5 mLs by mouth daily as needed (cough). Once at bedtime     [provider]  predniSONE (DELTASONE) 5 MG tablet Take 1 tablet (5 mg total) by mouth daily with breakfast. 09/08/18   Georgette Shell, MD  vitamin B-12 (CYANOCOBALAMIN) 100 MCG tablet Take 1 tablet by mouth daily.    [provider]     Vital Signs: Blood pressure 167/64, heart rate 98, respirations 24, O2 sat 94% room air, temp 97.7   Physical Exam awake, alert.  Chest with diminished breath sounds left base, right clear, occasional wheeze noted; heart with regular rate and rhythm.  Abdomen distended, soft, positive bowel sounds, mild generalized tenderness to palpation; significant bilateral lower extremity edema noted  Imaging: No results found.  Labs:  CBC: Recent Labs    09/30/18 0829 10/07/18 2008 10/08/18 0556 10/15/18 1223  WBC 5.9 5.6 4.4 6.0  HGB 10.1* 9.5* 8.7* 10.0*  HCT 31.9* 30.9* 28.7* 32.1*  PLT 140* 146* 105* 179    COAGS: Recent Labs    10/08/18 0556  INR 1.14    BMP: Recent Labs    09/30/18 0829 10/07/18 2008 10/08/18 0556 10/15/18 1223  NA 138 136 137 138  K 3.9 4.3 4.1 4.4  CL 105 104 106 102  CO2 25 25 26 25   GLUCOSE 106* 102* 98 97  BUN 18 21 20  24*  CALCIUM 8.8* 8.6* 8.2* 8.7*  CREATININE 0.79 0.65 0.60 0.81  GFRNONAA >60 >60 >60 >60  GFRAA >60 >60 >60 >60    LIVER FUNCTION TESTS: Recent Labs    09/26/18 1520 09/30/18 0829 10/07/18 2008 10/15/18 1223  BILITOT 0.7 0.9 0.6 0.7  AST 22 21 28 24   ALT 18 17 18 16   ALKPHOS 116 103 104 141*  PROT 6.5 6.3* 6.4* 6.3*  ALBUMIN 2.9* 2.8* 2.8* 2.5*    Assessment and Plan: Pt with history of stage IV metastatic lung adenocarcinoma, initially  diagnosed in 2013, status post chemoradiation.  She now has evidence of disease progression as well as poor venous access.  She presents today for Port-A-Cath placement for additional treatment.Risks and benefits of image guided port-a-catheter placement was discussed with the patient via interpreter including, but not limited to bleeding, infection, pneumothorax, or fibrin sheath development and need for additional procedures.  All of the patient's questions were answered, patient is agreeable to proceed. Consent signed and in chart.  LABS PENDING   Electronically Signed: D. Rowe Robert, PA-C 10/18/2018, 1:24 PM   I spent a total of 25 minutes at the the patient's bedside AND on the patient's hospital floor or unit, greater than 50% of which was counseling/coordinating care for port a  cath placement

## 2018-10-18 NOTE — Progress Notes (Signed)
Patient's face and chest are red without itching. Vancomycin is infusion. Infusion stopped.

## 2018-10-18 NOTE — Procedures (Signed)
Interventional Radiology Procedure Note  Procedure: Single Lumen Power Port Placement    Access:  Right IJ vein.  Findings: Catheter tip positioned at SVC/RA junction. Port is ready for immediate use.   Complications: None  EBL: < 10 mL  Recommendations:  - Ok to shower in 24 hours - Do not submerge for 7 days - Routine line care   Raiza Kiesel T. Darcey Demma, M.D Pager:  [phone]   

## 2018-10-18 NOTE — Discharge Instructions (Signed)
Please contact [place], Clinical Social Worker at [facility] about changes to healthcare advanced directives. Her contact info is [phone]      Insercin del dispositivo de perfusin implantable: cuidados posteriores (Implanted Port Insertion, Care After) Siga estas instrucciones durante las prximas semanas. Estas indicaciones le proporcionan informacin general acerca de cmo deber cuidarse despus del procedimiento. El mdico tambin podr darle instrucciones ms especficas. El tratamiento ha sido planificado segn las prcticas mdicas actuales, pero en algunos casos pueden ocurrir problemas. Comunquese con el mdico si tiene algn problema o tiene dudas despus del procedimiento. QU ESPERAR DESPUS DEL PROCEDIMIENTO Despus del procedimiento, es comn lo siguiente:  Molestias en el sitio de la insercin del dispositivo. Colocar compresas de hielo en la zona ayudar.  Hematomas en la piel alrededor del dispositivo, los cuales disminuirn luego de 3 o 4 das. INSTRUCCIONES PARA EL CUIDADO EN EL HOGAR  Luego que le coloquen el dispositivo, le darn una tarjeta de informacin del fabricante. La tarjeta contiene informacin acerca del dispositivo. Llvela siempre con usted.  Sepa qu tipo de dispositivo de perfusin implantable tiene. Hay diferentes tipos de dispositivos de perfusin implantables.  Use un brazalete de alerta mdico en caso de emergencia. Esto ayudar a Theatre stage manager a los mdicos que usted tiene un dispositivo de perfusin implantable.  El dispositivo puede permanecer implantado por el tiempo que el mdico considere necesario.  Posiblemente un enfermero vaya a su domicilio para administrarle los medicamentos y cuidar del dispositivo.  Usted o un familiar pueden recibir instrucciones y Engineer, building services para Architectural technologist los medicamentos y cuidar del dispositivo en Engineer, mining.  SOLICITE ATENCIN MDICA SI:  El dispositivo no funciona o  no puede hacer que retorne Herbalist.  Siente escalofros o fiebre.  SOLICITE ATENCIN MDICA DE INMEDIATO SI:  Observa un lquido nuevo o pus en la incisin.  Advierte un olor ftido que proviene del lugar de la incisin.  Observa hinchazn, ms eritemas o siente dolor en el lugar de la incisin o alrededor del dispositivo.  Siente falta de aire o Tourist information centre manager.  Esta informacin no tiene Marine scientist el consejo del mdico. Asegrese de hacerle al mdico cualquier pregunta que tenga. Document Released: 09/03/2013 Document Revised: 11/18/2013 Document Reviewed: 07/21/2013 Elsevier Interactive Patient Education  2017 [place] consciente moderada en los adultos, cuidados posteriores (Moderate Conscious Sedation, Adult, Care After) Estas indicaciones le proporcionan informacin acerca de cmo deber cuidarse despus del procedimiento. El mdico tambin podr darle instrucciones ms especficas. El tratamiento ha sido planificado segn las prcticas mdicas actuales, pero en algunos casos pueden ocurrir problemas. Comunquese con el mdico si tiene algn problema o dudas despus del procedimiento. QU ESPERAR DESPUS DEL PROCEDIMIENTO Despus del procedimiento, es comn:  Sentirse somnoliento durante varias horas.  Sentirse torpe y [employer] de equilibrio durante varias horas.  Perder el sentido de la realidad durante varias horas.  Vomitar si come [employer]. INSTRUCCIONES PARA EL CUIDADO EN EL HOGAR Durante al menos 24horas despus del procedimiento:  No haga lo siguiente: ? Participar en actividades que impliquen posibles cadas o lesiones. ? Conducir vehculos. ? Operar maquinarias pesadas. ? Beber alcohol. ? Tomar somnferos o medicamentos que causen somnolencia. ? Firmar documentos legales ni tomar [employer]. ? Cuidar a nios por su cuenta.  Hacer reposo. Comida y bebida  Siga la dieta recomendada por el mdico.  Si  vomita: ? Pruebe agua, jugo o sopa cuando usted pueda beber sin  vomitar. ? Asegrese de no tener nuseas antes de ingerir alimentos slidos. Instrucciones generales  Permanezca con un adulto responsable hasta que est completamente despierto y consciente.  Tome los medicamentos de venta libre y los recetados solamente como se lo haya indicado el mdico.  Si fuma, no lo haga sin supervisin.  Concurra a todas las visitas de control como se lo haya indicado el mdico. Esto es importante. SOLICITE ATENCIN MDICA SI:  Sigue teniendo nuseas o vomitando.  Tiene sensacin de desvanecimiento.  Le aparece una erupcin cutnea.  Tiene fiebre. SOLICITE ATENCIN MDICA DE INMEDIATO SI:  Tiene dificultad para respirar. Esta informacin no tiene Marine scientist el consejo del mdico. Asegrese de hacerle al mdico cualquier pregunta que tenga. Document Released: 11/18/2013 Document Revised: 12/04/2014 Document Reviewed: 03/04/2016 Elsevier Interactive Patient Education  [employer].

## 2018-10-21 ENCOUNTER — Telehealth: Payer: Self-pay | Admitting: Medical Oncology

## 2018-10-21 NOTE — Telephone Encounter (Signed)
err

## 2018-10-28 ENCOUNTER — Other Ambulatory Visit: Payer: Self-pay | Admitting: Internal Medicine

## 2018-10-28 ENCOUNTER — Encounter: Payer: Self-pay | Admitting: Internal Medicine

## 2018-10-28 ENCOUNTER — Inpatient Hospital Stay (HOSPITAL_BASED_OUTPATIENT_CLINIC_OR_DEPARTMENT_OTHER): Payer: Medicare Other | Admitting: Internal Medicine

## 2018-10-28 ENCOUNTER — Inpatient Hospital Stay: Payer: Medicare Other | Attending: Internal Medicine

## 2018-10-28 ENCOUNTER — Inpatient Hospital Stay: Payer: Medicare Other

## 2018-10-28 VITALS — BP 145/50 | HR 98 | Temp 97.7°F | Resp 18 | Ht 61.0 in | Wt 138.0 lb

## 2018-10-28 DIAGNOSIS — M7989 Other specified soft tissue disorders: Secondary | ICD-10-CM

## 2018-10-28 DIAGNOSIS — C7951 Secondary malignant neoplasm of bone: Secondary | ICD-10-CM

## 2018-10-28 DIAGNOSIS — Z5111 Encounter for antineoplastic chemotherapy: Secondary | ICD-10-CM

## 2018-10-28 DIAGNOSIS — C3492 Malignant neoplasm of unspecified part of left bronchus or lung: Secondary | ICD-10-CM

## 2018-10-28 DIAGNOSIS — C7952 Secondary malignant neoplasm of bone marrow: Secondary | ICD-10-CM

## 2018-10-28 DIAGNOSIS — C3432 Malignant neoplasm of lower lobe, left bronchus or lung: Secondary | ICD-10-CM

## 2018-10-28 DIAGNOSIS — C7801 Secondary malignant neoplasm of right lung: Secondary | ICD-10-CM

## 2018-10-28 DIAGNOSIS — C799 Secondary malignant neoplasm of unspecified site: Secondary | ICD-10-CM

## 2018-10-28 DIAGNOSIS — R05 Cough: Secondary | ICD-10-CM

## 2018-10-28 DIAGNOSIS — Z5112 Encounter for antineoplastic immunotherapy: Secondary | ICD-10-CM | POA: Insufficient documentation

## 2018-10-28 DIAGNOSIS — R630 Anorexia: Secondary | ICD-10-CM

## 2018-10-28 LAB — CMP (CANCER CENTER ONLY)
ALT: 15 U/L (ref 0–44)
AST: 24 U/L (ref 15–41)
Albumin: 2.5 g/dL — ABNORMAL LOW (ref 3.5–5.0)
Alkaline Phosphatase: 125 U/L (ref 38–126)
Anion gap: 9 (ref 5–15)
BILIRUBIN TOTAL: 0.6 mg/dL (ref 0.3–1.2)
BUN: 40 mg/dL — AB (ref 8–23)
CO2: 25 mmol/L (ref 22–32)
Calcium: 8.6 mg/dL — ABNORMAL LOW (ref 8.9–10.3)
Chloride: 108 mmol/L (ref 98–111)
Creatinine: 0.83 mg/dL (ref 0.44–1.00)
Glucose, Bld: 93 mg/dL (ref 70–99)
POTASSIUM: 4.5 mmol/L (ref 3.5–5.1)
Sodium: 142 mmol/L (ref 135–145)
TOTAL PROTEIN: 5.9 g/dL — AB (ref 6.5–8.1)

## 2018-10-28 LAB — CBC WITH DIFFERENTIAL (CANCER CENTER ONLY)
Abs Immature Granulocytes: 0.02 10*3/uL (ref 0.00–0.07)
BASOS PCT: 0 %
Basophils Absolute: 0 10*3/uL (ref 0.0–0.1)
EOS ABS: 0.1 10*3/uL (ref 0.0–0.5)
Eosinophils Relative: 1 %
HEMATOCRIT: 30.3 % — AB (ref 36.0–46.0)
Hemoglobin: 9.4 g/dL — ABNORMAL LOW (ref 12.0–15.0)
IMMATURE GRANULOCYTES: 0 %
LYMPHS ABS: 0.4 10*3/uL — AB (ref 0.7–4.0)
Lymphocytes Relative: 8 %
MCH: 28.9 pg (ref 26.0–34.0)
MCHC: 31 g/dL (ref 30.0–36.0)
MCV: 93.2 fL (ref 80.0–100.0)
MONOS PCT: 11 %
Monocytes Absolute: 0.5 10*3/uL (ref 0.1–1.0)
NRBC: 0 % (ref 0.0–0.2)
Neutro Abs: 4.1 10*3/uL (ref 1.7–7.7)
Neutrophils Relative %: 80 %
PLATELETS: 129 10*3/uL — AB (ref 150–400)
RBC: 3.25 MIL/uL — ABNORMAL LOW (ref 3.87–5.11)
RDW: 17.4 % — AB (ref 11.5–15.5)
WBC Count: 5.1 10*3/uL (ref 4.0–10.5)

## 2018-10-28 LAB — TOTAL PROTEIN, URINE DIPSTICK: Protein, ur: 30 mg/dL — AB

## 2018-10-28 MED ORDER — SODIUM CHLORIDE 0.9 % IV SOLN
10.0000 mg/kg | Freq: Once | INTRAVENOUS | Status: AC
  Administered 2018-10-28: 600 mg via INTRAVENOUS
  Filled 2018-10-28: qty 50

## 2018-10-28 MED ORDER — SODIUM CHLORIDE 0.9% FLUSH
10.0000 mL | INTRAVENOUS | Status: DC | PRN
  Administered 2018-10-28: 10 mL
  Filled 2018-10-28: qty 10

## 2018-10-28 MED ORDER — DIPHENHYDRAMINE HCL 50 MG/ML IJ SOLN
50.0000 mg | Freq: Once | INTRAMUSCULAR | Status: AC
  Administered 2018-10-28: 50 mg via INTRAVENOUS

## 2018-10-28 MED ORDER — LIDOCAINE-PRILOCAINE 2.5-2.5 % EX CREA: 1.0000 "application " | TOPICAL_CREAM | CUTANEOUS | 0 refills | Status: DC | PRN

## 2018-10-28 MED ORDER — ACETAMINOPHEN 325 MG PO TABS
650.0000 mg | ORAL_TABLET | Freq: Once | ORAL | Status: AC
  Administered 2018-10-28: 650 mg via ORAL

## 2018-10-28 MED ORDER — HEPARIN SOD (PORK) LOCK FLUSH 100 UNIT/ML IV SOLN
500.0000 [IU] | Freq: Once | INTRAVENOUS | Status: AC | PRN
  Administered 2018-10-28: 500 [IU]
  Filled 2018-10-28: qty 5

## 2018-10-28 MED ORDER — DENOSUMAB 120 MG/1.7ML ~~LOC~~ SOLN
SUBCUTANEOUS | Status: AC
  Filled 2018-10-28: qty 1.7

## 2018-10-28 MED ORDER — DENOSUMAB 120 MG/1.7ML ~~LOC~~ SOLN
120.0000 mg | Freq: Once | SUBCUTANEOUS | Status: AC
  Administered 2018-10-28: 120 mg via SUBCUTANEOUS

## 2018-10-28 MED ORDER — FUROSEMIDE 20 MG PO TABS: 20.0000 mg | ORAL_TABLET | Freq: Every day | ORAL | 0 refills | Status: DC

## 2018-10-28 MED ORDER — HYDROCODONE-HOMATROPINE 5-1.5 MG/5ML PO SYRP: 5.0000 mL | ORAL_SOLUTION | Freq: Four times a day (QID) | ORAL | 0 refills | Status: DC | PRN

## 2018-10-28 MED ORDER — DIPHENHYDRAMINE HCL 50 MG/ML IJ SOLN
INTRAMUSCULAR | Status: AC
  Filled 2018-10-28: qty 1

## 2018-10-28 MED ORDER — SODIUM CHLORIDE 0.9 % IV SOLN
Freq: Once | INTRAVENOUS | Status: AC
  Administered 2018-10-28: 11:00:00 via INTRAVENOUS
  Filled 2018-10-28: qty 250

## 2018-10-28 MED ORDER — ACETAMINOPHEN 325 MG PO TABS
ORAL_TABLET | ORAL | Status: AC
  Filled 2018-10-28: qty 2

## 2018-10-28 NOTE — Patient Instructions (Signed)
**Please pick up a Calcium and Vitamin D supplement at your pharmacy today. Take twice a day (morning and evening)**  [place] Discharge Instructions for Patients Receiving Chemotherapy  Today you received the following chemotherapy agents: Ramucirumab (Cyramza)  To help prevent nausea and vomiting after your treatment, we encourage you to take your nausea medication as directed.    If you develop nausea and vomiting that is not controlled by your nausea medication, call the clinic.   BELOW ARE SYMPTOMS THAT SHOULD BE REPORTED IMMEDIATELY:  *FEVER GREATER THAN 100.5 F  *CHILLS WITH OR WITHOUT FEVER  NAUSEA AND VOMITING THAT IS NOT CONTROLLED WITH YOUR NAUSEA MEDICATION  *UNUSUAL SHORTNESS OF BREATH  *UNUSUAL BRUISING OR BLEEDING  TENDERNESS IN MOUTH AND THROAT WITH OR WITHOUT PRESENCE OF ULCERS  *URINARY PROBLEMS  *BOWEL PROBLEMS  UNUSUAL RASH Items with * indicate a potential emergency and should be followed up as soon as possible.  Feel free to call the clinic should you have any questions or concerns. The clinic phone number is [phone].  Please show the [place] at check-in to the Emergency Department and triage nurse.  Denosumab injection Qu es este medicamento? El DENOSUMAB desacelera la descomposicin de los [place]. Prolia se utiliza para tratar la osteoporosis en hombres y en mujeres despus de la menopausia. Delton See se [place] para prevenir fracturas de huesos y otros problemas de huesos provocados por la metstasis en el cncer de huesos. Delton See se utiliza tambin para tratar tumor de clulas gigantes del hueso. Este medicamento puede ser utilizado para otros usos; si tiene alguna pregunta consulte con su proveedor de atencin mdica o con su farmacutico. MARCAS COMUNES: Prolia, XGEVA Qu le debo informar a mi profesional de la salud antes de tomar este medicamento? Necesita saber si usted presenta alguno de los siguientes problemas  o situaciones: enfermedad dental eccema infeccin o antecedentes de infecciones enfermedad renal o si est bajo tratamiento de dilisis bajo nivel de calcio o vitamina D en la sangre sndrome de malabsorcin una ciruga programada o extracciones dentales si toma medicamentos que contienen denosumab enfermedad tiroidea o de las paratiroides una reaccin alrgica o inusual al denosumab, a otros medicamentos, alimentos, colorantes o conservantes si est embarazada o buscando quedar embarazada si est amamantando a un beb Cmo debo utilizar este medicamento? Este medicamento se administra mediante una inyeccin por va subcutnea. Lo administra un profesional de Technical sales engineer en un hospital o en un entorno clnico. Si est recibiendo Prolia, su farmacutico le dar una Gua del medicamento especial (MedGuide, nombre en ingls) con cada receta y en cada ocasin que la vuelva a surtir. Asegrese de leer esta informacin cada vez cuidadosamente. Para Prolia, hable con su pediatra para informarse acerca del uso de este medicamento en nios. Puede requerir atencin especial. Jacklyn Shell, hable con su pediatra para informarse acerca del uso de este medicamento en nios. Aunque este medicamento se puede recetar a nios tan pequeos como de 13 aos de edad en casos selectos, existen precauciones que deben tomarse. Sobredosis: Pngase en contacto inmediatamente con un centro toxicolgico o una sala de urgencia si usted cree que haya tomado demasiado medicamento. ATENCIN: [employer] es solo para usted. No comparta este medicamento con nadie. Qu sucede si me olvido de una dosis? Es importante no olvidar ninguna dosis. Informe a su mdico o a su profesional de la salud si no puede asistir a Photographer. Qu puede interactuar con este medicamento? No tome esta medicina con ninguno  de los siguientes medicamentos: otros medicamentos que contienen denosumab Esta medicina tambin puede Counselling psychologist con los siguientes  medicamentos: medicamentos que suprimen el sistema inmunolgico medicamentos para tratar Risk analyst esteroideos, como la prednisona o la cortisona Puede ser que esta lista no menciona todas las posibles interacciones. Informe a su profesional de [employer]	[place] de [employer] productos a base de hierbas, medicamentos de [place] o suplementos nutritivos que est tomando. Si usted fuma, consume bebidas alcohlicas o si utiliza drogas ilegales, indqueselo tambin a su profesional de KB Home	[place]. Algunas sustancias pueden interactuar con su medicamento. A qu debo estar atento al usar Coca[employer]a? Visite a su mdico o a su profesional de la salud para chequeos peridicos. Su mdico o su profesional de la salud puede pedirle anlisis de [place] u otras pruebas para Chief Technology Officer su evolucin. Si desarrolla un resfro u otra infeccin mientras est recibiendo Coca[employer]Cola, consulte a su mdico o su profesional de Technical sales engineer. No se trate usted mismo. Este medicamento puede reducir la capacidad del cuerpo para combatir infecciones. Debe asegurarse de que su dieta incluya la cantidad necesaria de calcio y vitamina D mientras est tomando este medicamento, a menos que su mdico indique lo contrario. Hable con su profesional de la salud acerca de sus alimentos y las vitaminas que est tomando. Visite a su dentista habitualmente. [place] sus dientes o use hilo dental para los dientes como indicado. Antes de someterse a Academic librarian, informe a su dentista que est [employer]. No se debe quedar embarazada mientras est tomando este medicamento o por 5 meses despus de terminarlo. Las mujeres deben informar a su mdico si estn buscando quedar embarazadas o si creen que estn embarazadas. Existe la posibilidad de efectos secundarios graves a un beb sin nacer. Para ms informacin hable con su profesional de la salud o su farmacutico. Qu efectos secundarios puedo tener al utilizar este  medicamento? Efectos secundarios que debe informar a su mdico o a Barrister's clerk de la salud tan pronto como sea posible: Chief of Staff, como erupcin cutnea, picazn o urticarias, e hinchazn de la cara, los labios o la lengua problemas respiratorios dolor en el pecho ritmo cardiaco rpido, irregular sensacin de desmayos o aturdimiento, cadas fiebre, escalofros o cualquier otro signo de infeccin espasmos, tensin o tics musculares entumecimiento u hormigueo ampollas o bultos en la piel, o piel seca, descamada o roja sanacin lenta o dolor inexplicable en la boca o mandbula sangrado o moretones inusuales Efectos secundarios que generalmente no requieren atencin mdica (infrmelos a su mdico o a Barrister's clerk de la salud si persisten o si son molestos): Presenter, broadcasting, gases Puede ser que esta lista no menciona todos los posibles efectos secundarios. Comunquese a su mdico por asesoramiento mdico [employer]. Usted puede informar los efectos secundarios a la FDA por telfono al 1-800-FDA-1088. Dnde debo guardar mi medicina? Este medicamento se administra en clnicas, consultorio del mdico u otro establecimiento de atencin mdica y no necesitar guardarlo en su domicilio. ATENCIN: Este folleto es un resumen. Puede ser que no cubra toda la posible informacin. Si usted tiene preguntas acerca de esta medicina, consulte con su mdico, su farmacutico o su profesional de Technical sales engineer.  2018 Elsevier/Gold Standard (2016-12-14 00:00:00)

## 2018-10-28 NOTE — Progress Notes (Signed)
Through an interpretor instructed pt to add a Calcium supplement to her Vitamin D supplements to help with her Xgeva injections. Provided patient and husband of examples of supplements that have a combination of Calcium and Vitamin D so she only has to take one tablet twice a day. Also update AVS to include new instructions. Both pt and husband verbalized understanding/agreement. Maggie Shuda-RPh updated and Dr. Julien Nordmann and his desk RNs updated on plan.

## 2018-10-28 NOTE — Progress Notes (Signed)
[place] [phone]   [phone]  OFFICE PROGRESS NOTE  Rutherford Guys, MD [address] [place] [address]  DIAGNOSIS: Metastatic non-small cell lung cancer, adenocarcinoma with positive EGFR mutation in exon 19 and development of resistant T790M mutation. This was initially diagnosed in July 2013.  PRIOR THERAPY: 1) Status post radiotherapy to the left face rib metastasis under the care of Dr. Lisbeth Renshaw.  2) stereotactic radiotherapy to the enlarging left lower lobe lung nodule under the care of Dr. Lisbeth Renshaw. 3) treatment with Tarceva 150 mg by mouth daily, therapy beginning 07/20/2012. Status post approximately 36 months of therapy. This was discontinued secondary to disease progression. 4) stereotactic radiotherapy to enlarging pulmonary nodules under the care of Dr. Lisbeth Renshaw.  CURRENT THERAPY: 1) Tagrisso 80 mg by mouth daily started 02/03/2016 status post more than 30 months of treatment.  This is now in addition to Cyramza 10 mg/KG every 2 weeks status post 1 cycle. 2) Xgeva 120 mg subcutaneously every 4 weeks for bone metastasis.  INTERVAL HISTORY: Jacqueline Leonard 72 y.o. female returns to the clinic today for follow-up visit accompanied by her husband and Spanish interpreter.  The patient continues to complain of increasing fatigue and weakness as well as lack of appetite and the swelling of the lower extremities.  She denied having any chest pain but continues to have dry cough with no hemoptysis.  She has no nausea, vomiting, diarrhea or constipation.  She is tolerating her treatment with Tagrisso and Cyramza fairly well.  She had a Port-A-Cath placed recently.  The patient is here today for evaluation before resuming her treatment with Cyramza.  MEDICAL HISTORY: Past Medical History:  Diagnosis Date  . Allergy   . Anxiety   . External hemorrhoids   . Family history of breast cancer   . Family history of lung cancer   . Hyperlipidemia     . Hypertension   . Lung cancer metastatic to bone ([place]) 06/19/2012   bx=L 5th ribmetastatic Adenocarcinoma with known lung mass  . Radiation 07/11/12-07/24/12   Palliative lung tx 30 gray in 10 fx  . S/P radiation therapy 07/22/14-07/31/14   left lung/60gy/6f  . Status post chemoradiation    Tarceva  . Vitamin D deficiency     ALLERGIES:  is allergic to vancomycin and penicillins.  MEDICATIONS:  Current Outpatient Medications  Medication Sig Dispense Refill  . cholecalciferol (VITAMIN D) 1000 UNITS tablet Take 1,000 Units by mouth daily.    .Marland Kitchenescitalopram (LEXAPRO) 10 MG tablet Take 1 tablet (10 mg total) by mouth daily. 30 tablet 0  . methylPREDNISolone (MEDROL DOSEPAK) 4 MG TBPK tablet Use as instructed. 21 tablet 0  . mirtazapine (REMERON) 30 MG tablet Take 1 tablet (30 mg total) by mouth at bedtime. 30 tablet 2  . ondansetron (ZOFRAN) 4 MG tablet Take 1 tablet (4 mg total) by mouth every 6 (six) hours as needed for nausea. 20 tablet 0  . osimertinib mesylate (TAGRISSO) 80 MG tablet Take 1 tablet (80 mg total) by mouth daily. 30 tablet 2  . Phenyleph-Doxylamine-DM-APAP (NYQUIL SEVERE COLD/FLU PO) Take 5 mLs by mouth daily as needed (cough). Once at bedtime     . predniSONE (DELTASONE) 5 MG tablet Take 1 tablet (5 mg total) by mouth daily with breakfast. 20 tablet 0  . vitamin B-12 (CYANOCOBALAMIN) 100 MCG tablet Take 1 tablet by mouth daily.     No current facility-administered medications for this visit.  SURGICAL HISTORY:  Past Surgical History:  Procedure Laterality Date  . APPENDECTOMY     done at time of ovarian surgery, not ruptured.  . IR IMAGING GUIDED PORT INSERTION  10/18/2018  . OVARIAN CYST REMOVAL     twice - 1970's and 1980's  . SOFT TISSUE BIOPSY  06/19/12   L 5th rib=metastatic adenocarcinoma    REVIEW OF SYSTEMS:  Constitutional: positive for anorexia, fatigue and weight loss Eyes: negative Ears, nose, mouth, throat, and face: negative Respiratory:  positive for cough Cardiovascular: negative Gastrointestinal: positive for abdominal pain Genitourinary:negative Integument/breast: negative Hematologic/lymphatic: negative Musculoskeletal:positive for muscle weakness Neurological: negative Behavioral/Psych: negative Endocrine: negative Allergic/Immunologic: negative   PHYSICAL EXAMINATION: General appearance: alert, cooperative, fatigued and no distress Head: Normocephalic, without obvious abnormality, atraumatic Neck: no adenopathy, no JVD, supple, symmetrical, trachea midline and thyroid not enlarged, symmetric, no tenderness/mass/nodules Lymph nodes: Cervical, supraclavicular, and axillary nodes normal. Resp: rales bilaterally and wheezes bilaterally Back: symmetric, no curvature. ROM normal. No CVA tenderness. Cardio: regular rate and rhythm, S1, S2 normal, no murmur, click, rub or gallop GI: soft, non-tender; bowel sounds normal; no masses,  no organomegaly Extremities: extremities normal, atraumatic, no cyanosis or edema Neurologic: Alert and oriented X 3, normal strength and tone. Normal symmetric reflexes. Normal coordination and gait  ECOG PERFORMANCE STATUS: 1 - Symptomatic but completely ambulatory  Blood pressure (!) 145/50, pulse 98, temperature 97.7 F (36.5 C), temperature source Oral, resp. rate 18, height 5' 1"  (1.549 m), weight 138 lb (62.6 kg), SpO2 100 %.  LABORATORY DATA: Lab Results  Component Value Date   WBC 5.1 10/28/2018   HGB 9.4 (L) 10/28/2018   HCT 30.3 (L) 10/28/2018   MCV 93.2 10/28/2018   PLT 129 (L) 10/28/2018      Chemistry      Component Value Date/Time   NA 142 10/28/2018 0920   NA 140 11/01/2017 1423   K 4.5 10/28/2018 0920   K 3.7 11/01/2017 1423   CL 108 10/28/2018 0920   CL 102 05/06/2013 1511   CO2 25 10/28/2018 0920   CO2 25 11/01/2017 1423   BUN 40 (H) 10/28/2018 0920   BUN 21.2 11/01/2017 1423   CREATININE 0.83 10/28/2018 0920   CREATININE 1.0 11/01/2017 1423        Component Value Date/Time   CALCIUM 8.6 (L) 10/28/2018 0920   CALCIUM 9.3 11/01/2017 1423   ALKPHOS 125 10/28/2018 0920   ALKPHOS 81 11/01/2017 1423   AST 24 10/28/2018 0920   AST 30 11/01/2017 1423   ALT 15 10/28/2018 0920   ALT 21 11/01/2017 1423   BILITOT 0.6 10/28/2018 0920   BILITOT 0.45 11/01/2017 1423       RADIOGRAPHIC STUDIES: Dg Chest 1 View  Result Date: 10/08/2018 CLINICAL DATA:  Status post left thoracentesis EXAM: CHEST  1 VIEW COMPARISON:  Chest radiograph 10/07/2018 FINDINGS: Slight decrease in size of left pleural effusion. No pneumothorax. Streaky opacities at the right lung apex are unchanged. IMPRESSION: Slight decrease in size of left pleural effusion. No pneumothorax. Electronically Signed   By: Ulyses Jarred M.D.   On: 10/08/2018 15:21   Dg Chest 2 View  Result Date: 10/07/2018 CLINICAL DATA:  Shortness of breath.  History of lung carcinoma EXAM: CHEST - 2 VIEW COMPARISON:  September 20, 2018 chest radiograph; chest CT September 05, 2018 FINDINGS: There remains a sizable partially loculated right pleural effusion with consolidation throughout the right mid and lower lung zones. Consolidation remains throughout the right upper lobe  with volume loss the right apex. No new opacity is evident. Heart size and pulmonary vascularity are normal. No adenopathy. No bone lesions. IMPRESSION: Sizable left pleural effusion with consolidation throughout the left mid and lower lung zones. Consolidation with volume loss right upper lobe. Stable cardiac silhouette. No new opacity. Electronically Signed   By: Lowella Grip III M.D.   On: 10/07/2018 20:32   Ir Imaging Guided Port Insertion  Result Date: 10/18/2018 CLINICAL DATA:  Recurrent metastatic adenocarcinoma of the lung and poor IV access with need for porta cath. EXAM: IMPLANTED PORT A CATH PLACEMENT WITH ULTRASOUND AND FLUOROSCOPIC GUIDANCE ANESTHESIA/SEDATION: 2.0 mg IV Versed; 100 mcg IV Fentanyl Total Moderate Sedation  Time:  30 minutes The patient's level of consciousness and physiologic status were continuously monitored during the procedure by Radiology nursing. Additional Medications: 1 g IV vancomycin. FLUOROSCOPY TIME:  12 seconds.  1.9 mGy. PROCEDURE: The procedure, risks, benefits, and alternatives were explained to the patient. Questions regarding the procedure were encouraged and answered. The patient understands and consents to the procedure. A time-out was performed prior to initiating the procedure. Ultrasound was utilized to confirm patency of the right internal jugular vein. The right neck and chest were prepped with chlorhexidine in a sterile fashion, and a sterile drape was applied covering the operative field. Maximum barrier sterile technique with sterile gowns and gloves were used for the procedure. Local anesthesia was provided with 1% lidocaine. After creating a small venotomy incision, a 21 gauge needle was advanced into the right internal jugular vein under direct, real-time ultrasound guidance. Ultrasound image documentation was performed. After securing guidewire access, an 8 Fr dilator was placed. A J-wire was kinked to measure appropriate catheter length. A subcutaneous port pocket was then created along the upper chest wall utilizing sharp and blunt dissection. Portable cautery was utilized. The pocket was irrigated with sterile saline. A single lumen power injectable port was chosen for placement. The 8 Fr catheter was tunneled from the port pocket site to the venotomy incision. The port was placed in the pocket. External catheter was trimmed to appropriate length based on guidewire measurement. At the venotomy, an 8 Fr peel-away sheath was placed over a guidewire. The catheter was then placed through the sheath and the sheath removed. Final catheter positioning was confirmed and documented with a fluoroscopic spot image. The port was accessed with a needle and aspirated and flushed with heparinized  saline. The access needle was removed. The venotomy and port pocket incisions were closed with subcutaneous 3-0 Monocryl and subcuticular 4-0 Vicryl. Dermabond was applied to both incisions. COMPLICATIONS: COMPLICATIONS None FINDINGS: After catheter placement, the tip lies at the cavo-atrial junction. The catheter aspirates normally and is ready for immediate use. IMPRESSION: Placement of single lumen port a cath via right internal jugular vein. The catheter tip lies at the cavo-atrial junction. A power injectable port a cath was placed and is ready for immediate use. Electronically Signed   By: Aletta Edouard M.D.   On: 10/18/2018 15:04   US Thoracentesis Asp Pleural Space W/img Guide  Result Date: 10/08/2018 INDICATION: Patient with history of metastatic lung cancer, dyspnea, recurrent left pleural effusion. Request made for diagnostic and therapeutic left thoracentesis. EXAM: ULTRASOUND GUIDED DIAGNOSTIC AND THERAPEUTIC LEFT THORACENTESIS MEDICATIONS: None COMPLICATIONS: None immediate. PROCEDURE: An ultrasound guided thoracentesis was thoroughly discussed with the patient and questions answered. The benefits, risks, alternatives and complications were also discussed. The patient understands and wishes to proceed with the procedure. Written consent was  obtained. Ultrasound was performed to localize and mark an adequate pocket of fluid in the left chest. The area was then prepped and draped in the normal sterile fashion. 1% Lidocaine was used for local anesthesia. Under ultrasound guidance a 6 Fr Safe-T-Centesis catheter was introduced. Thoracentesis was performed. The catheter was removed and a dressing applied. FINDINGS: A total of approximately 700 cc of blood-tinged fluid was removed. Samples were sent to the laboratory as requested by the clinical team. Due to patient chest discomfort only the above amount of fluid was removed today. IMPRESSION: Successful ultrasound guided diagnostic and therapeutic  left thoracentesis yielding 700 cc of pleural fluid. Read by: Rowe Robert, PA-C Electronically Signed   By: Corrie Mckusick D.O.   On: 10/08/2018 14:58    ASSESSMENT AND PLAN:  This is a very pleasant 72 years old Hispanic female with a stage IV non-small cell lung cancer, adenocarcinoma with positive EGFR mutation with deletion in exon 19 diagnosed in July 2013 status post 3 years treatment with Tarceva discontinued secondary to disease progression and development of T7 42 M EGFR resistant mutation. The patient was started on treatment with Tagrisso 80 mg by mouth daily status post 30 months.  She also received palliative radiotherapy to the progressive pulmonary nodules. Molecular studies by [facility] showed the development of new resistant mutation C797S.   Unfortunately there is no target option for the new mutation. She had evidence for disease progression and the patient is currently undergoing treatment with Tagrisso in addition to Cyramza 10 mg/KG every 2 weeks status post 1 cycle. The patient continues to tolerate this treatment well except for the increasing fatigue and weakness. She missed cycle #2 because of lack of IV access. She has a Port-A-Cath placed recently.  I gave her a prescription for EMLA cream today. I recommended for the patient to proceed with cycle #2 of Cyramza today as a scheduled. For the swelling of the lower extremities, I started the patient on Lasix 20 mg p.o. daily.  She was advised to increase her potassium rich diet. For the dry cough, I started the patient on Hycodan. I will see the patient back for follow-up visit in 2 weeks for evaluation before starting cycle #3. For the lack of appetite and weight loss, She will also continue on Remeron 30 mg p.o. Nightly. She was advised to call immediately if she has any concerning symptoms in the interval. All questions were answered. The patient knows to call the clinic with any problems, questions or concerns. We  can certainly see the patient much sooner if necessary.  Disclaimer: This note was dictated with voice recognition software. Similar sounding words can inadvertently be transcribed and may not be corrected upon review.

## 2018-11-01 ENCOUNTER — Other Ambulatory Visit: Payer: Self-pay

## 2018-11-01 ENCOUNTER — Emergency Department (HOSPITAL_COMMUNITY): Payer: Medicare Other

## 2018-11-01 ENCOUNTER — Encounter (HOSPITAL_COMMUNITY): Payer: Self-pay

## 2018-11-01 ENCOUNTER — Other Ambulatory Visit: Payer: Self-pay | Admitting: Family Medicine

## 2018-11-01 ENCOUNTER — Inpatient Hospital Stay (HOSPITAL_COMMUNITY)
Admission: EM | Admit: 2018-11-01 | Discharge: 2018-11-09 | DRG: 640 | Disposition: A | Discharge status: Hospice | Payer: Medicare Other | Attending: Internal Medicine | Admitting: Internal Medicine

## 2018-11-01 ENCOUNTER — Observation Stay (HOSPITAL_COMMUNITY): Payer: Medicare Other

## 2018-11-01 DIAGNOSIS — Z6826 Body mass index (BMI) 26.0-26.9, adult: Secondary | ICD-10-CM

## 2018-11-01 DIAGNOSIS — Z923 Personal history of irradiation: Secondary | ICD-10-CM

## 2018-11-01 DIAGNOSIS — Z66 Do not resuscitate: Secondary | ICD-10-CM | POA: Diagnosis not present

## 2018-11-01 DIAGNOSIS — J90 Pleural effusion, not elsewhere classified: Secondary | ICD-10-CM

## 2018-11-01 DIAGNOSIS — E43 Unspecified severe protein-calorie malnutrition: Secondary | ICD-10-CM | POA: Diagnosis present

## 2018-11-01 DIAGNOSIS — S42032A Displaced fracture of lateral end of left clavicle, initial encounter for closed fracture: Secondary | ICD-10-CM | POA: Diagnosis not present

## 2018-11-01 DIAGNOSIS — R0602 Shortness of breath: Secondary | ICD-10-CM

## 2018-11-01 DIAGNOSIS — R110 Nausea: Secondary | ICD-10-CM

## 2018-11-01 DIAGNOSIS — M8580 Other specified disorders of bone density and structure, unspecified site: Secondary | ICD-10-CM | POA: Diagnosis present

## 2018-11-01 DIAGNOSIS — R531 Weakness: Secondary | ICD-10-CM

## 2018-11-01 DIAGNOSIS — L89152 Pressure ulcer of sacral region, stage 2: Secondary | ICD-10-CM | POA: Diagnosis present

## 2018-11-01 DIAGNOSIS — C3490 Malignant neoplasm of unspecified part of unspecified bronchus or lung: Secondary | ICD-10-CM | POA: Diagnosis present

## 2018-11-01 DIAGNOSIS — C799 Secondary malignant neoplasm of unspecified site: Secondary | ICD-10-CM | POA: Diagnosis present

## 2018-11-01 DIAGNOSIS — R627 Adult failure to thrive: Secondary | ICD-10-CM | POA: Diagnosis not present

## 2018-11-01 DIAGNOSIS — S42036A Nondisplaced fracture of lateral end of unspecified clavicle, initial encounter for closed fracture: Secondary | ICD-10-CM | POA: Diagnosis present

## 2018-11-01 DIAGNOSIS — Z515 Encounter for palliative care: Secondary | ICD-10-CM | POA: Diagnosis not present

## 2018-11-01 DIAGNOSIS — Z880 Allergy status to penicillin: Secondary | ICD-10-CM

## 2018-11-01 DIAGNOSIS — S42035A Nondisplaced fracture of lateral end of left clavicle, initial encounter for closed fracture: Secondary | ICD-10-CM

## 2018-11-01 DIAGNOSIS — I10 Essential (primary) hypertension: Secondary | ICD-10-CM | POA: Diagnosis present

## 2018-11-01 DIAGNOSIS — S42002A Fracture of unspecified part of left clavicle, initial encounter for closed fracture: Secondary | ICD-10-CM | POA: Diagnosis present

## 2018-11-01 DIAGNOSIS — Z7952 Long term (current) use of systemic steroids: Secondary | ICD-10-CM

## 2018-11-01 DIAGNOSIS — N390 Urinary tract infection, site not specified: Secondary | ICD-10-CM

## 2018-11-01 DIAGNOSIS — R918 Other nonspecific abnormal finding of lung field: Secondary | ICD-10-CM | POA: Diagnosis not present

## 2018-11-01 DIAGNOSIS — Y842 Radiological procedure and radiotherapy as the cause of abnormal reaction of the patient, or of later complication, without mention of misadventure at the time of the procedure: Secondary | ICD-10-CM | POA: Diagnosis present

## 2018-11-01 DIAGNOSIS — D696 Thrombocytopenia, unspecified: Secondary | ICD-10-CM | POA: Diagnosis present

## 2018-11-01 DIAGNOSIS — E559 Vitamin D deficiency, unspecified: Secondary | ICD-10-CM | POA: Diagnosis present

## 2018-11-01 DIAGNOSIS — R609 Edema, unspecified: Secondary | ICD-10-CM | POA: Diagnosis present

## 2018-11-01 DIAGNOSIS — D6959 Other secondary thrombocytopenia: Secondary | ICD-10-CM | POA: Diagnosis present

## 2018-11-01 DIAGNOSIS — E440 Moderate protein-calorie malnutrition: Secondary | ICD-10-CM | POA: Diagnosis present

## 2018-11-01 DIAGNOSIS — Z79899 Other long term (current) drug therapy: Secondary | ICD-10-CM

## 2018-11-01 DIAGNOSIS — D630 Anemia in neoplastic disease: Secondary | ICD-10-CM | POA: Diagnosis present

## 2018-11-01 DIAGNOSIS — J9601 Acute respiratory failure with hypoxia: Secondary | ICD-10-CM | POA: Diagnosis not present

## 2018-11-01 DIAGNOSIS — Z85118 Personal history of other malignant neoplasm of bronchus and lung: Secondary | ICD-10-CM

## 2018-11-01 DIAGNOSIS — Z79891 Long term (current) use of opiate analgesic: Secondary | ICD-10-CM

## 2018-11-01 DIAGNOSIS — C7951 Secondary malignant neoplasm of bone: Secondary | ICD-10-CM | POA: Diagnosis present

## 2018-11-01 DIAGNOSIS — T451X5A Adverse effect of antineoplastic and immunosuppressive drugs, initial encounter: Secondary | ICD-10-CM | POA: Diagnosis present

## 2018-11-01 DIAGNOSIS — J811 Chronic pulmonary edema: Secondary | ICD-10-CM | POA: Diagnosis present

## 2018-11-01 DIAGNOSIS — R140 Abdominal distension (gaseous): Secondary | ICD-10-CM

## 2018-11-01 DIAGNOSIS — W19XXXA Unspecified fall, initial encounter: Secondary | ICD-10-CM | POA: Diagnosis present

## 2018-11-01 DIAGNOSIS — L8990 Pressure ulcer of unspecified site, unspecified stage: Secondary | ICD-10-CM

## 2018-11-01 DIAGNOSIS — Z881 Allergy status to other antibiotic agents status: Secondary | ICD-10-CM

## 2018-11-01 DIAGNOSIS — R188 Other ascites: Secondary | ICD-10-CM

## 2018-11-01 DIAGNOSIS — F419 Anxiety disorder, unspecified: Secondary | ICD-10-CM | POA: Diagnosis present

## 2018-11-01 DIAGNOSIS — Z803 Family history of malignant neoplasm of breast: Secondary | ICD-10-CM

## 2018-11-01 DIAGNOSIS — Z801 Family history of malignant neoplasm of trachea, bronchus and lung: Secondary | ICD-10-CM

## 2018-11-01 LAB — URINALYSIS, ROUTINE W REFLEX MICROSCOPIC
Bilirubin Urine: NEGATIVE
Glucose, UA: NEGATIVE mg/dL
Ketones, ur: 15 mg/dL — AB
Nitrite: NEGATIVE
Protein, ur: 100 mg/dL — AB
Specific Gravity, Urine: 1.025 (ref 1.005–1.030)
pH: 6.5 (ref 5.0–8.0)

## 2018-11-01 LAB — CBC
HCT: 33.7 % — ABNORMAL LOW (ref 36.0–46.0)
Hemoglobin: 10 g/dL — ABNORMAL LOW (ref 12.0–15.0)
MCH: 28.7 pg (ref 26.0–34.0)
MCHC: 29.7 g/dL — ABNORMAL LOW (ref 30.0–36.0)
MCV: 96.6 fL (ref 80.0–100.0)
PLATELETS: 128 10*3/uL — AB (ref 150–400)
RBC: 3.49 MIL/uL — AB (ref 3.87–5.11)
RDW: 17.7 % — ABNORMAL HIGH (ref 11.5–15.5)
WBC: 10.2 10*3/uL (ref 4.0–10.5)
nRBC: 0 % (ref 0.0–0.2)

## 2018-11-01 LAB — BASIC METABOLIC PANEL
ANION GAP: 10 (ref 5–15)
BUN: 40 mg/dL — ABNORMAL HIGH (ref 8–23)
CO2: 26 mmol/L (ref 22–32)
Calcium: 7.8 mg/dL — ABNORMAL LOW (ref 8.9–10.3)
Chloride: 106 mmol/L (ref 98–111)
Creatinine, Ser: 0.85 mg/dL (ref 0.44–1.00)
GFR calc Af Amer: >60 mL/min (ref >60)
GFR calc non Af Amer: >60 mL/min (ref >60)
GLUCOSE: 109 mg/dL — AB (ref 70–99)
Potassium: 4 mmol/L (ref 3.5–5.1)
Sodium: 142 mmol/L (ref 135–145)

## 2018-11-01 LAB — URINALYSIS, MICROSCOPIC (REFLEX): WBC, UA: >50 WBC/hpf (ref 0–5)

## 2018-11-01 LAB — HEPATIC FUNCTION PANEL
ALT: 15 U/L (ref 0–44)
AST: 27 U/L (ref 15–41)
Albumin: 2.7 g/dL — ABNORMAL LOW (ref 3.5–5.0)
Alkaline Phosphatase: 113 U/L (ref 38–126)
Bilirubin, Direct: 0.2 mg/dL (ref 0.0–0.2)
Indirect Bilirubin: 0.6 mg/dL (ref 0.3–0.9)
Total Bilirubin: 0.8 mg/dL (ref 0.3–1.2)
Total Protein: 6.1 g/dL — ABNORMAL LOW (ref 6.5–8.1)

## 2018-11-01 LAB — BRAIN NATRIURETIC PEPTIDE: B Natriuretic Peptide: 66.3 pg/mL (ref 0.0–100.0)

## 2018-11-01 LAB — GLUCOSE, CAPILLARY: Glucose-Capillary: 85 mg/dL (ref 70–99)

## 2018-11-01 LAB — LIPASE, BLOOD: Lipase: 30 U/L (ref 11–51)

## 2018-11-01 LAB — TROPONIN I: Troponin I: <0.03 ng/mL (ref <0.03)

## 2018-11-01 MED ORDER — DOXYCYCLINE HYCLATE 100 MG PO TABS: 100.0000 mg | ORAL_TABLET | Freq: Once | ORAL | Status: DC

## 2018-11-01 MED ORDER — SODIUM CHLORIDE 0.9 % IV SOLN
1.0000 g | Freq: Once | INTRAVENOUS | Status: AC
  Administered 2018-11-01: 1 g via INTRAVENOUS
  Filled 2018-11-01: qty 10

## 2018-11-01 NOTE — ED Triage Notes (Addendum)
Pt is accompanied with family. Pt is currently taking 2 chemo meds. Pt has had emesis and "painful" leg swelling on and off for approximately 1 month. Pt fell in her bedroom 2 weeks ago on her shoulder and injured left shoulder as well. Pt has had emesis every time she eats. Pt denies diarrhea and nausea at this time. Pt has been unable to eat. Pt had infusion on Monday, and did not tell her oncologist about these symptoms.

## 2018-11-01 NOTE — ED Provider Notes (Signed)
  Face-to-face evaluation   History: Decreased appetite for several weeks. Recent fall injuring left shoulder.  Patient's daughter concerned about swelling in legs and left pleural effusion.  She is asked the oncologist about this and if there is anything that can be done, but not gotten any resolution.  Several weeks ago her PCP gave her some pills to improve her appetite but it has not helped.  Physical exam: Frail elderly appearing female.  She is uncomfortable.  She requires help to sit up because of generalized weakness.  Lungs with decreased air movement left lower and left mid likely related to effusion.  No wheezing rales or rhonchi.  There is no increased work of breathing.  Legs with 2-3+ pitting edema bilaterally of the lower legs.  Medical screening examination/treatment/procedure(s) were conducted as a shared visit with non-physician practitioner(s) and myself.  I personally evaluated the patient during the encounter    Daleen Bo, MD 11/02/18 1236

## 2018-11-01 NOTE — ED Notes (Signed)
ED TO INPATIENT HANDOFF REPORT  Name/Age/Gender Jacqueline Leonard 72 y.o. female  Code Status Code Status History    Date Active Date Inactive Code Status Order ID Comments User Context   10/08/2018 0208 10/09/2018 1413 Full Code [id number]  Rise Patience, MD Inpatient   09/05/2018 0209 09/08/2018 1644 Full Code [id number]  Vianne Bulls, MD ED      Home/SNF/Other Home  Chief Complaint ca pat vomitting pain in legs  Level of Care/Admitting Diagnosis ED Disposition    ED Disposition Condition [place]: [facility] [[id number]]  Level of Care: Telemetry [5]  Admit to tele based on following criteria: Monitor for Ischemic changes  Diagnosis: Weakness [332951]  Admitting Physician: Rise Patience [phone]  Attending Physician: Rise Patience Lei.Right  PT Class (Do Not Modify): Observation [104]  PT Acc Code (Do Not Modify): Observation [10022]       Medical History Past Medical History:  Diagnosis Date  . Allergy   . Anxiety   . External hemorrhoids   . Family history of breast cancer   . Family history of lung cancer   . Hyperlipidemia   . Hypertension   . Lung cancer metastatic to bone ([place]) 06/19/2012   bx=L 5th ribmetastatic Adenocarcinoma with known lung mass  . Radiation 07/11/12-07/24/12   Palliative lung tx 30 gray in 10 fx  . S/P radiation therapy 07/22/14-07/31/14   left lung/60gy/59fx  . Status post chemoradiation    Tarceva  . Vitamin D deficiency     Allergies Allergies  Allergen Reactions  . Vancomycin Other (See Comments)    Red face and chest  . Penicillins Palpitations and Other (See Comments)    Has patient had a PCN reaction causing immediate rash, facial/tongue/throat swelling, SOB or lightheadedness with hypotension: No Has patient had a PCN reaction causing severe rash involving mucus membranes or skin necrosis: No Has patient had a PCN reaction that required hospitalization No Has patient  had a PCN reaction occurring within the last 10 years: Yes If all of the above answers are "NO", then may proceed with Cephalosporin use.    IV Location/Drains/Wounds Patient Lines/Drains/Airways Status   Active Line/Drains/Airways    Name:   Placement date:   Placement time:   Site:   Days:   Implanted Port 10/18/18 Right Chest   10/18/18    1437    Chest   14          Labs/Imaging Results for orders placed or performed during the hospital encounter of 11/01/18 (from the past 48 hour(s))  Basic metabolic panel     Status: Abnormal   Collection Time: 11/01/18  3:07 PM  Result Value Ref Range   Sodium 142 135 - 145 mmol/L   Potassium 4.0 3.5 - 5.1 mmol/L   Chloride 106 98 - 111 mmol/L   CO2 26 22 - 32 mmol/L   Glucose, Bld 109 (H) 70 - 99 mg/dL   BUN 40 (H) 8 - 23 mg/dL   Creatinine, Ser 0.85 0.44 - 1.00 mg/dL   Calcium 7.8 (L) 8.9 - 10.3 mg/dL   GFR calc non Af Amer >60 >60 mL/min   GFR calc Af Amer >60 >60 mL/min   Anion gap 10 5 - 15    Comment: Performed at [facility], [place] [address], [place], [place] [address]  CBC     Status: Abnormal   Collection Time: 11/01/18  3:07 PM  Result  Value Ref Range   WBC 10.2 4.0 - 10.5 K/uL   RBC 3.49 (L) 3.87 - 5.11 MIL/uL   Hemoglobin 10.0 (L) 12.0 - 15.0 g/dL   HCT 33.7 (L) 36.0 - 46.0 %   MCV 96.6 80.0 - 100.0 fL   MCH 28.7 26.0 - 34.0 pg   MCHC 29.7 (L) 30.0 - 36.0 g/dL   RDW 17.7 (H) 11.5 - 15.5 %   Platelets 128 (L) 150 - 400 K/uL   nRBC 0.0 0.0 - 0.2 %    Comment: Performed at [facility], [place] [address] [place], [place] [address]  Troponin I - ONCE - STAT     Status: None   Collection Time: 11/01/18  3:07 PM  Result Value Ref Range   Troponin I <0.03 <0.03 ng/mL    Comment: Performed at [facility], [place] [address] [place], [place] [address]  Hepatic function panel     Status: Abnormal   Collection Time: 11/01/18  3:07 PM  Result Value Ref Range   Total  Protein 6.1 (L) 6.5 - 8.1 g/dL   Albumin 2.7 (L) 3.5 - 5.0 g/dL   AST 27 15 - 41 U/L   ALT 15 0 - 44 U/L   Alkaline Phosphatase 113 38 - 126 U/L   Total Bilirubin 0.8 0.3 - 1.2 mg/dL   Bilirubin, Direct 0.2 0.0 - 0.2 mg/dL   Indirect Bilirubin 0.6 0.3 - 0.9 mg/dL    Comment: Performed at [facility], [place] [address] [place], [place] [address]  Lipase, blood     Status: None   Collection Time: 11/01/18  3:07 PM  Result Value Ref Range   Lipase 30 11 - 51 U/L    Comment: Performed at [facility], [place] [address], [name], [place] [address]  Brain natriuretic peptide     Status: None   Collection Time: 11/01/18  3:07 PM  Result Value Ref Range   B Natriuretic Peptide 66.3 0.0 - 100.0 pg/mL    Comment: Performed at [facility], [place] [address] [place], [place] [address]  Glucose, capillary     Status: None   Collection Time: 11/01/18  4:31 PM  Result Value Ref Range   Glucose-Capillary 85 70 - 99 mg/dL  Urinalysis, Routine w reflex microscopic     Status: Abnormal   Collection Time: 11/01/18  7:00 PM  Result Value Ref Range   Color, Urine YELLOW YELLOW   APPearance CLOUDY (A) CLEAR   Specific Gravity, Urine 1.025 1.005 - 1.030   pH 6.5 5.0 - 8.0   Glucose, UA NEGATIVE NEGATIVE mg/dL   Hgb urine dipstick LARGE (A) NEGATIVE   Bilirubin Urine NEGATIVE NEGATIVE   Ketones, ur 15 (A) NEGATIVE mg/dL   Protein, ur 100 (A) NEGATIVE mg/dL   Nitrite NEGATIVE NEGATIVE   Leukocytes, UA MODERATE (A) NEGATIVE    Comment: Performed at [place] [address], [place], [place] [address]  Urinalysis, Microscopic (reflex)     Status: Abnormal   Collection Time: 11/01/18  7:00 PM  Result Value Ref Range   RBC / HPF 11-20 0 - 5 RBC/hpf   WBC, UA >50 0 - 5 WBC/hpf   Bacteria, UA FEW (A) NONE SEEN   Squamous Epithelial / LPF 0-5 0 - 5    Comment: Performed at [facility], [place] [address],  [place], [place] [address]   Dg Shoulder Left  Result Date:  11/01/2018 CLINICAL DATA:  72 year old female with left shoulder pain EXAM: LEFT SHOULDER - 2+ VIEW COMPARISON:  Chest x-ray 10/08/2018 FINDINGS: Acute fracture of the distal clavicle, nondisplaced. Glenohumeral joint is congruent. Degenerative changes at the acromioclavicular joint. Left-sided pleural effusion with reticular changes of the lungs. Port catheter is new from the prior chest x-ray IMPRESSION: Acute fracture distal left clavicle. Left-sided pleural effusion, which was present on prior chest x-ray. Electronically Signed   By: Corrie Mckusick D.O.   On: 11/01/2018 15:32   None  Pending Labs Unresulted Labs (From admission, onward)    Start     Ordered   11/01/18 1956  Urine Culture  ONCE - STAT,   STAT     11/01/18 1955          Vitals/Pain Today's Vitals   11/01/18 2036 11/01/18 2100 11/01/18 2130 11/01/18 2200  BP: (!) 127/47 (!) 126/48 (!) 133/43 (!) 139/48  Pulse: 91 92  95  Resp: 18 (!) 26 (!) 26 (!) 33  Temp:      TempSrc:      SpO2: 96% 96% 96% 96%  Weight:      Height:      PainSc:        Isolation Precautions No active isolations  Medications Medications  cefTRIAXone (ROCEPHIN) 1 g in sodium chloride 0.9 % 100 mL IVPB ( Intravenous Stopped 11/01/18 2047)    Mobility walks with person assist

## 2018-11-01 NOTE — ED Notes (Signed)
Bed: AN19 Expected date:  Expected time:  Means of arrival:  Comments: EMS-FTT

## 2018-11-01 NOTE — Telephone Encounter (Signed)
Requested medication (s) are due for refill today: yes Zantac  Requested medication (s) are on the active medication list: No DC'd 08/16/18 Pt preference  Last refill:  discontinued  Future visit scheduled: no  Notes to clinic:  Pt has multiple oncology appointments. Not sure if she may need this again    Requested Prescriptions  Pending Prescriptions Disp Refills   ranitidine (ZANTAC) 300 MG tablet [Pharmacy Med Name: RANITIDINE 300MG  TABLETS] 30 tablet 0    Sig: TAKE 1 TABLET(300 MG) BY MOUTH AT BEDTIME     Gastroenterology:  H2 Antagonists Passed - 11/01/2018  6:12 AM      Passed - Valid encounter within last 12 months    Recent Outpatient Visits          1 month ago Adenocarcinoma of left lung, stage 4 ([place])   [name], [name], MD   2 months ago Cough   [name], [name], MD   5 months ago Cough   [name], [name], MD   6 months ago Cough   Primary [facility] at [place], [place]   7 months ago Essential hypertension   [name], [name], MD           Signed Prescriptions Disp Refills   mirtazapine (REMERON) 7.5 MG tablet 30 tablet 0    Sig: TAKE 1 TABLET(7.5 MG) BY MOUTH AT BEDTIME     Psychiatry: Antidepressants - mirtazapine Passed - 11/01/2018  6:12 AM      Passed - AST in normal range and within 360 days    AST  Date Value Ref Range Status  10/28/2018 24 15 - 41 U/L Final  11/01/2017 30 5 - 34 U/L Final         Passed - ALT in normal range and within 360 days    ALT  Date Value Ref Range Status  10/28/2018 15 0 - 44 U/L Final  11/01/2017 21 0 - 55 U/L Final         Passed - Triglycerides in normal range and within 360 days    Triglycerides  Date Value Ref Range Status  02/06/2018 81 0 - 149 mg/dL Final         Passed - Total Cholesterol in normal range and within 360 days    Cholesterol, Total  Date Value Ref Range Status  02/06/2018 198 100 - 199 mg/dL  Final         Passed - WBC in normal range and within 360 days    WBC  Date Value Ref Range Status  10/18/2018 5.6 4.0 - 10.5 K/uL Final   WBC Count  Date Value Ref Range Status  10/28/2018 5.1 4.0 - 10.5 K/uL Final         Passed - Valid encounter within last 6 months    Recent Outpatient Visits          1 month ago Adenocarcinoma of left lung, stage 4 ([place])   [name], [name], MD   2 months ago Cough   [name], [name], MD   5 months ago Cough   [name], [name], MD   6 months ago Cough   Primary Care at [facility], [place], [place]   7 months ago Essential hypertension   [name], [name], MD

## 2018-11-01 NOTE — ED Notes (Signed)
PHLEBOTOMY NOTE: 1st set of blood cultures sent down to lab as save/extra

## 2018-11-01 NOTE — ED Provider Notes (Signed)
[facility] DEPT Provider Note   CSN: [id number] Arrival date & time: 11/01/18  1435     History   Chief Complaint Chief Complaint  Patient presents with  . Weakness  . Shoulder Pain    HPI Jacqueline Leonard is a 72 y.o. female.  The history is provided by the patient, a relative and medical records. No language interpreter was used.  Weakness   Shoulder Pain       72 year old female with history of metastatic lung cancer currently receiving palliative radiation, malnutrition, hypertension, vitamin D deficiency, bone metastasis prior via family member for evaluation of fall and weakness.  History obtained through daughter and through patient.  For the past month, patient has progressive worsening generalized fatigue, lack of appetite, eating minimally, and has been nausea and vomiting each time she eats.  She also fell in her bedroom 2 weeks ago and landed on her left shoulder.  Since then she is having trouble moving her left arm.  She has pleural effusion as well as has had progressive worsening swelling to her lower extremities.  No report of fever or chills, no cold symptoms or dysuria.  She denies any significant pain at this time.  She was started on a new radiation infusion approximately a month ago.  Family is concerned of her worsening health.  Her oncologist is Dr. Lorna Few.  She is a DNR.  She is not involved in hospice care.  She has been receiving p.o. chemo treatment for ongoing for the past year.  Past Medical History:  Diagnosis Date  . Allergy   . Anxiety   . External hemorrhoids   . Family history of breast cancer   . Family history of lung cancer   . Hyperlipidemia   . Hypertension   . Lung cancer metastatic to bone ([place]) 06/19/2012   bx=L 5th ribmetastatic Adenocarcinoma with known lung mass  . Radiation 07/11/12-07/24/12   Palliative lung tx 30 gray in 10 fx  . S/P radiation therapy 07/22/14-07/31/14   left lung/60gy/52fx    . Status post chemoradiation    Tarceva  . Vitamin D deficiency     Patient Active Problem List   Diagnosis Date Noted  . Pleural effusion 10/08/2018  . Acute on chronic respiratory failure with hypoxia ([place]) 10/08/2018  . Dyspnea   . Goals of care, counseling/discussion 09/23/2018  . Pleural effusion, left 09/05/2018  . Malnutrition of moderate degree 09/05/2018  . Pelvic mass in female 09/02/2018  . Genetic testing 08/30/2018  . Family history of breast cancer   . Family history of lung cancer   . UTI (urinary tract infection) 12/26/2016  . Acute cystitis without hematuria   . Hyponatremia   . Epistaxis 10/04/2016  . Adenocarcinoma of lung, stage 4, left ([place]) 06/13/2016  . Bone metastases ([place]) 04/11/2016  . Encounter for antineoplastic chemotherapy 02/14/2016  . Anemia in neoplastic disease 06/29/2015  . Pulmonary metastasis ([place]) 07/16/2014  . Vitamin B12 deficiency anemia 02/12/2014  . Secondary malignant neoplasm of bone and bone marrow ([place]) 06/29/2012  . Metastatic adenocarcinoma, pathology 06/19/12 06/27/2012  . Hypertension   . Hyperlipidemia   . Vitamin D deficiency     Past Surgical History:  Procedure Laterality Date  . APPENDECTOMY     done at time of ovarian surgery, not ruptured.  . IR IMAGING GUIDED PORT INSERTION  10/18/2018  . OVARIAN CYST REMOVAL     twice - 1970's and 1980's  . SOFT TISSUE BIOPSY  06/19/12   L 5th rib=metastatic adenocarcinoma     OB History    Gravida  0   Para  0   Term  0   Preterm  0   AB  0   Living  0     SAB  0   TAB  0   Ectopic  0   Multiple  0   Live Births  0            Home Medications    Prior to Admission medications   Medication Sig Start Date End Date Taking? Authorizing Provider  cholecalciferol (VITAMIN D) 1000 UNITS tablet Take 1,000 Units by mouth daily.    [provider]  escitalopram (LEXAPRO) 10 MG tablet Take 1 tablet (10 mg total) by mouth daily. 09/08/18    Georgette Shell, MD  furosemide (LASIX) 20 MG tablet Take 1 tablet (20 mg total) by mouth daily. 10/28/18   Curt Bears, MD  HYDROcodone-homatropine [facility]) 5-1.5 MG/5ML syrup Take 5 mLs by mouth every 6 (six) hours as needed for cough. 10/28/18   Curt Bears, MD  lidocaine-prilocaine (EMLA) cream Apply 1 application topically as needed. 10/28/18   Curt Bears, MD  methylPREDNISolone (MEDROL DOSEPAK) 4 MG TBPK tablet Use as instructed. 10/15/18   Curt Bears, MD  mirtazapine (REMERON) 30 MG tablet Take 1 tablet (30 mg total) by mouth at bedtime. 08/21/18   Rutherford Guys, MD  mirtazapine (REMERON) 7.5 MG tablet TAKE 1 TABLET(7.5 MG) BY MOUTH AT BEDTIME 11/01/18   Rutherford Guys, MD  ondansetron (ZOFRAN) 4 MG tablet Take 1 tablet (4 mg total) by mouth every 6 (six) hours as needed for nausea. 09/07/18   Georgette Shell, MD  osimertinib mesylate (TAGRISSO) 80 MG tablet Take 1 tablet (80 mg total) by mouth daily. 08/12/18   Maryanna Shape, NP  Phenyleph-Doxylamine-DM-APAP (NYQUIL SEVERE COLD/FLU PO) Take 5 mLs by mouth daily as needed (cough). Once at bedtime     [provider]  predniSONE (DELTASONE) 5 MG tablet Take 1 tablet (5 mg total) by mouth daily with breakfast. 09/08/18   Georgette Shell, MD  vitamin B-12 (CYANOCOBALAMIN) 100 MCG tablet Take 1 tablet by mouth daily.    [provider]    Family History Family History  Problem Relation Age of Onset  . Lung cancer Brother   . Breast cancer Sister        dx early 11s, d 15  . Breast cancer Sister 40       d 35s    Social History Social History   Tobacco Use  . Smoking status: Never Smoker  . Smokeless tobacco: Never Used  Substance Use Topics  . Alcohol use: No  . Drug use: No     Allergies   Vancomycin and Penicillins   Review of Systems Review of Systems  Neurological: Positive for weakness.  All other systems reviewed and are negative.    Physical  Exam Updated Vital Signs BP 134/64 (BP Location: Right Arm)   Pulse (!) 104   Temp 98.1 F (36.7 C) (Oral)   Resp 16   Ht 5\' 1"  (1.549 m)   Wt 62.6 kg   SpO2 97%   BMI 26.07 kg/m   Physical Exam  Constitutional: She appears well-developed and well-nourished. No distress.  Chronically ill-appearing elderly female nontoxic in appearance  HENT:  Head: Atraumatic.  Eyes: Conjunctivae are normal.  Neck: Neck supple. No JVD present.  Cardiovascular: Normal  rate and regular rhythm.  Pulmonary/Chest: No stridor. She has no wheezes. She has rales.  Abdominal: She exhibits distension. There is no tenderness.  Musculoskeletal: She exhibits edema (Bilateral 3+ pitting edema to lower extremities) and tenderness (Tenderness about the left clavicle on palpation with decreased left shoulder range of motion.).  Neurological: She is alert.  Alert and oriented x2.  Skin: No rash noted.  Psychiatric: She has a normal mood and affect.  Nursing note and vitals reviewed.    ED Treatments / Results  Labs (all labs ordered are listed, but only abnormal results are displayed) Labs Reviewed  BASIC METABOLIC PANEL - Abnormal; Notable for the following components:      Result Value   Glucose, Bld 109 (*)    BUN 40 (*)    Calcium 7.8 (*)    All other components within normal limits  CBC - Abnormal; Notable for the following components:   RBC 3.49 (*)    Hemoglobin 10.0 (*)    HCT 33.7 (*)    MCHC 29.7 (*)    RDW 17.7 (*)    Platelets 128 (*)    All other components within normal limits  URINALYSIS, ROUTINE W REFLEX MICROSCOPIC - Abnormal; Notable for the following components:   APPearance CLOUDY (*)    Hgb urine dipstick LARGE (*)    Ketones, ur 15 (*)    Protein, ur 100 (*)    Leukocytes, UA MODERATE (*)    All other components within normal limits  HEPATIC FUNCTION PANEL - Abnormal; Notable for the following components:   Total Protein 6.1 (*)    Albumin 2.7 (*)    All other  components within normal limits  URINALYSIS, MICROSCOPIC (REFLEX) - Abnormal; Notable for the following components:   Bacteria, UA FEW (*)    All other components within normal limits  URINE CULTURE  TROPONIN I  LIPASE, BLOOD  BRAIN NATRIURETIC PEPTIDE  GLUCOSE, CAPILLARY  CBG MONITORING, ED    EKG None  Radiology Dg Shoulder Left  Result Date: 11/01/2018 CLINICAL DATA:  72 year old female with left shoulder pain EXAM: LEFT SHOULDER - 2+ VIEW COMPARISON:  Chest x-ray 10/08/2018 FINDINGS: Acute fracture of the distal clavicle, nondisplaced. Glenohumeral joint is congruent. Degenerative changes at the acromioclavicular joint. Left-sided pleural effusion with reticular changes of the lungs. Port catheter is new from the prior chest x-ray IMPRESSION: Acute fracture distal left clavicle. Left-sided pleural effusion, which was present on prior chest x-ray. Electronically Signed   By: Corrie Mckusick D.O.   On: 11/01/2018 15:32    Procedures Procedures (including critical care time)  Medications Ordered in ED Medications  cefTRIAXone (ROCEPHIN) 1 g in sodium chloride 0.9 % 100 mL IVPB ( Intravenous Stopped 11/01/18 2047)     Initial Impression / Assessment and Plan / ED Course  I have reviewed the triage vital signs and the nursing notes.  Pertinent labs & imaging results that were available during my care of the patient were reviewed by me and considered in my medical decision making (see chart for details).     BP (!) 133/43   Pulse 92   Temp 98.1 F (36.7 C) (Oral)   Resp (!) 26   Ht 5\' 1"  (1.549 m)   Wt 62.6 kg   SpO2 96%   BMI 26.07 kg/m    Final Clinical Impressions(s) / ED Diagnoses   Final diagnoses:  Lower urinary tract infectious disease  Generalized weakness  Traumatic closed nondisplaced fracture of acromial end  of clavicle, left, initial encounter    ED Discharge Orders    None     4:04 PM This is a patient with significant history of metastatic lung  cancer currently receiving chemo and radiation here with generalized weakness, malnutrition, lack of appetite, as well as injury to her left shoulder from a fall 2 weeks ago.  X-ray today of the left shoulder demonstrate a distal clavicular fracture.  This is a closed injury.  She is neurovascular intact.  Sling provided for comfort.  We will check labs, and determine further management.  She denies any significant pain.  9:30 PM UA concerning for urinary tract infection.  In the setting of generalized weakness, difficulty eating drinking due to nausea, and having a traumatic closed nondisplaced fracture of the left clavicle, patient would benefit from IV antibiotic and admission for further management of her condition.  She is unable to tolerate p.o. medication at home.  Care discussed with Dr. Eulis Foster.  9:50 PM Appreciate consultation from [place] DR. Hal Hope who agrees to see and admit pt for IV abx for UTI.     Domenic Moras, PA-C 11/01/18 2151    Daleen Bo, MD 11/02/18 1236

## 2018-11-02 ENCOUNTER — Observation Stay (HOSPITAL_COMMUNITY): Payer: Medicare Other

## 2018-11-02 DIAGNOSIS — R531 Weakness: Secondary | ICD-10-CM

## 2018-11-02 DIAGNOSIS — F419 Anxiety disorder, unspecified: Secondary | ICD-10-CM | POA: Diagnosis present

## 2018-11-02 DIAGNOSIS — C3490 Malignant neoplasm of unspecified part of unspecified bronchus or lung: Secondary | ICD-10-CM | POA: Diagnosis present

## 2018-11-02 DIAGNOSIS — C7951 Secondary malignant neoplasm of bone: Secondary | ICD-10-CM | POA: Diagnosis present

## 2018-11-02 DIAGNOSIS — Y842 Radiological procedure and radiotherapy as the cause of abnormal reaction of the patient, or of later complication, without mention of misadventure at the time of the procedure: Secondary | ICD-10-CM | POA: Diagnosis present

## 2018-11-02 DIAGNOSIS — E43 Unspecified severe protein-calorie malnutrition: Secondary | ICD-10-CM | POA: Diagnosis present

## 2018-11-02 DIAGNOSIS — J90 Pleural effusion, not elsewhere classified: Secondary | ICD-10-CM

## 2018-11-02 DIAGNOSIS — Z515 Encounter for palliative care: Secondary | ICD-10-CM | POA: Diagnosis present

## 2018-11-02 DIAGNOSIS — T451X5A Adverse effect of antineoplastic and immunosuppressive drugs, initial encounter: Secondary | ICD-10-CM | POA: Diagnosis present

## 2018-11-02 DIAGNOSIS — N390 Urinary tract infection, site not specified: Secondary | ICD-10-CM

## 2018-11-02 DIAGNOSIS — R180 Malignant ascites: Secondary | ICD-10-CM | POA: Diagnosis not present

## 2018-11-02 DIAGNOSIS — E559 Vitamin D deficiency, unspecified: Secondary | ICD-10-CM | POA: Diagnosis present

## 2018-11-02 DIAGNOSIS — S42002A Fracture of unspecified part of left clavicle, initial encounter for closed fracture: Secondary | ICD-10-CM | POA: Diagnosis present

## 2018-11-02 DIAGNOSIS — E440 Moderate protein-calorie malnutrition: Secondary | ICD-10-CM | POA: Diagnosis present

## 2018-11-02 DIAGNOSIS — Z6826 Body mass index (BMI) 26.0-26.9, adult: Secondary | ICD-10-CM | POA: Diagnosis not present

## 2018-11-02 DIAGNOSIS — D630 Anemia in neoplastic disease: Secondary | ICD-10-CM | POA: Diagnosis present

## 2018-11-02 DIAGNOSIS — C799 Secondary malignant neoplasm of unspecified site: Secondary | ICD-10-CM | POA: Diagnosis not present

## 2018-11-02 DIAGNOSIS — I10 Essential (primary) hypertension: Secondary | ICD-10-CM | POA: Diagnosis present

## 2018-11-02 DIAGNOSIS — Z7189 Other specified counseling: Secondary | ICD-10-CM | POA: Diagnosis not present

## 2018-11-02 DIAGNOSIS — R627 Adult failure to thrive: Secondary | ICD-10-CM | POA: Diagnosis present

## 2018-11-02 DIAGNOSIS — D696 Thrombocytopenia, unspecified: Secondary | ICD-10-CM | POA: Diagnosis present

## 2018-11-02 DIAGNOSIS — R188 Other ascites: Secondary | ICD-10-CM | POA: Diagnosis present

## 2018-11-02 DIAGNOSIS — J9601 Acute respiratory failure with hypoxia: Secondary | ICD-10-CM | POA: Diagnosis present

## 2018-11-02 DIAGNOSIS — W19XXXA Unspecified fall, initial encounter: Secondary | ICD-10-CM | POA: Diagnosis present

## 2018-11-02 DIAGNOSIS — S42035A Nondisplaced fracture of lateral end of left clavicle, initial encounter for closed fracture: Secondary | ICD-10-CM | POA: Diagnosis present

## 2018-11-02 DIAGNOSIS — L89152 Pressure ulcer of sacral region, stage 2: Secondary | ICD-10-CM | POA: Diagnosis present

## 2018-11-02 DIAGNOSIS — C482 Malignant neoplasm of peritoneum, unspecified: Secondary | ICD-10-CM | POA: Diagnosis not present

## 2018-11-02 DIAGNOSIS — Z66 Do not resuscitate: Secondary | ICD-10-CM | POA: Diagnosis present

## 2018-11-02 DIAGNOSIS — J811 Chronic pulmonary edema: Secondary | ICD-10-CM | POA: Diagnosis present

## 2018-11-02 DIAGNOSIS — D6959 Other secondary thrombocytopenia: Secondary | ICD-10-CM | POA: Diagnosis present

## 2018-11-02 DIAGNOSIS — S42036A Nondisplaced fracture of lateral end of unspecified clavicle, initial encounter for closed fracture: Secondary | ICD-10-CM | POA: Diagnosis present

## 2018-11-02 LAB — BASIC METABOLIC PANEL
Anion gap: 9 (ref 5–15)
BUN: 38 mg/dL — AB (ref 8–23)
CO2: 25 mmol/L (ref 22–32)
Calcium: 7.2 mg/dL — ABNORMAL LOW (ref 8.9–10.3)
Chloride: 108 mmol/L (ref 98–111)
Creatinine, Ser: 0.77 mg/dL (ref 0.44–1.00)
GFR calc Af Amer: >60 mL/min (ref >60)
GFR calc non Af Amer: >60 mL/min (ref >60)
GLUCOSE: 82 mg/dL (ref 70–99)
Potassium: 3.9 mmol/L (ref 3.5–5.1)
Sodium: 142 mmol/L (ref 135–145)

## 2018-11-02 LAB — CBC
HCT: 27.7 % — ABNORMAL LOW (ref 36.0–46.0)
Hemoglobin: 8.3 g/dL — ABNORMAL LOW (ref 12.0–15.0)
MCH: 28.9 pg (ref 26.0–34.0)
MCHC: 30 g/dL (ref 30.0–36.0)
MCV: 96.5 fL (ref 80.0–100.0)
Platelets: 102 10*3/uL — ABNORMAL LOW (ref 150–400)
RBC: 2.87 MIL/uL — ABNORMAL LOW (ref 3.87–5.11)
RDW: 17.6 % — ABNORMAL HIGH (ref 11.5–15.5)
WBC: 6.6 10*3/uL (ref 4.0–10.5)
nRBC: 0 % (ref 0.0–0.2)

## 2018-11-02 MED ORDER — METHYLPREDNISOLONE 4 MG PO TBPK
4.0000 mg | ORAL_TABLET | Freq: Three times a day (TID) | ORAL | Status: AC
  Administered 2018-11-03 (×3): 4 mg via ORAL

## 2018-11-02 MED ORDER — METHYLPREDNISOLONE 4 MG PO TBPK
8.0000 mg | ORAL_TABLET | Freq: Every morning | ORAL | Status: AC
  Filled 2018-11-02: qty 21

## 2018-11-02 MED ORDER — ENSURE ENLIVE PO LIQD
237.0000 mL | Freq: Three times a day (TID) | ORAL | Status: DC
  Administered 2018-11-02 – 2018-11-06 (×5): 237 mL via ORAL

## 2018-11-02 MED ORDER — SODIUM CHLORIDE 0.9% FLUSH
10.0000 mL | INTRAVENOUS | Status: DC | PRN
  Administered 2018-11-06: 20 mL
  Filled 2018-11-02: qty 40

## 2018-11-02 MED ORDER — FLEET ENEMA 7-19 GM/118ML RE ENEM: 1.0000 | ENEMA | Freq: Every day | RECTAL | Status: DC | PRN

## 2018-11-02 MED ORDER — ACETAMINOPHEN 650 MG RE SUPP: 650.0000 mg | Freq: Four times a day (QID) | RECTAL | Status: DC | PRN

## 2018-11-02 MED ORDER — METHYLPREDNISOLONE 4 MG PO TBPK
8.0000 mg | ORAL_TABLET | Freq: Every morning | ORAL | Status: DC
  Filled 2018-11-02: qty 21

## 2018-11-02 MED ORDER — SENNOSIDES-DOCUSATE SODIUM 8.6-50 MG PO TABS
1.0000 | ORAL_TABLET | Freq: Two times a day (BID) | ORAL | Status: DC
  Administered 2018-11-02 – 2018-11-06 (×6): 1 via ORAL
  Filled 2018-11-02 (×10): qty 1

## 2018-11-02 MED ORDER — ACETAMINOPHEN 325 MG PO TABS
650.0000 mg | ORAL_TABLET | Freq: Four times a day (QID) | ORAL | Status: DC | PRN
  Administered 2018-11-03 – 2018-11-06 (×3): 650 mg via ORAL
  Filled 2018-11-02 (×3): qty 2

## 2018-11-02 MED ORDER — METHYLPREDNISOLONE 4 MG PO TBPK
8.0000 mg | ORAL_TABLET | Freq: Every evening | ORAL | Status: AC
  Administered 2018-11-02: 8 mg via ORAL

## 2018-11-02 MED ORDER — POTASSIUM CHLORIDE CRYS ER 20 MEQ PO TBCR
40.0000 meq | EXTENDED_RELEASE_TABLET | Freq: Once | ORAL | Status: AC
  Administered 2018-11-02: 40 meq via ORAL
  Filled 2018-11-02: qty 2

## 2018-11-02 MED ORDER — METHYLPREDNISOLONE 4 MG PO TBPK
8.0000 mg | ORAL_TABLET | Freq: Every evening | ORAL | Status: AC
  Administered 2018-11-03: 8 mg via ORAL
  Filled 2018-11-02: qty 21

## 2018-11-02 MED ORDER — ONDANSETRON HCL 4 MG/2ML IJ SOLN
4.0000 mg | Freq: Four times a day (QID) | INTRAMUSCULAR | Status: DC | PRN
  Administered 2018-11-04 – 2018-11-07 (×2): 4 mg via INTRAVENOUS
  Filled 2018-11-02 (×2): qty 2

## 2018-11-02 MED ORDER — METHYLPREDNISOLONE 4 MG PO TBPK
4.0000 mg | ORAL_TABLET | Freq: Four times a day (QID) | ORAL | Status: DC
  Administered 2018-11-04: 4 mg via ORAL

## 2018-11-02 MED ORDER — METHYLPREDNISOLONE 4 MG PO TBPK: 4.0000 mg | ORAL_TABLET | ORAL | Status: AC

## 2018-11-02 MED ORDER — MIRTAZAPINE 15 MG PO TABS
7.5000 mg | ORAL_TABLET | Freq: Every day | ORAL | Status: DC
  Administered 2018-11-02 – 2018-11-06 (×6): 7.5 mg via ORAL
  Filled 2018-11-02 (×7): qty 1

## 2018-11-02 MED ORDER — FUROSEMIDE 20 MG PO TABS: 20.0000 mg | ORAL_TABLET | Freq: Every day | ORAL | Status: DC

## 2018-11-02 MED ORDER — HYDROCODONE-HOMATROPINE 5-1.5 MG/5ML PO SYRP
5.0000 mL | ORAL_SOLUTION | Freq: Four times a day (QID) | ORAL | Status: DC | PRN
  Administered 2018-11-02 (×2): 5 mL via ORAL
  Filled 2018-11-02 (×2): qty 5

## 2018-11-02 MED ORDER — LIDOCAINE HCL 1 % IJ SOLN
INTRAMUSCULAR | Status: AC
  Filled 2018-11-02: qty 10

## 2018-11-02 MED ORDER — ENSURE ENLIVE PO LIQD
237.0000 mL | Freq: Two times a day (BID) | ORAL | Status: DC
  Administered 2018-11-02: 237 mL via ORAL

## 2018-11-02 MED ORDER — FUROSEMIDE 10 MG/ML IJ SOLN
20.0000 mg | Freq: Two times a day (BID) | INTRAMUSCULAR | Status: DC
  Administered 2018-11-02 – 2018-11-09 (×15): 20 mg via INTRAVENOUS
  Filled 2018-11-02 (×15): qty 2

## 2018-11-02 MED ORDER — OSIMERTINIB MESYLATE 80 MG PO TABS: 80.0000 mg | ORAL_TABLET | Freq: Every day | ORAL | Status: DC

## 2018-11-02 MED ORDER — SODIUM CHLORIDE 0.9 % IV SOLN
1.0000 g | INTRAVENOUS | Status: DC
  Administered 2018-11-02 – 2018-11-03 (×2): 1 g via INTRAVENOUS
  Filled 2018-11-02 (×2): qty 1

## 2018-11-02 MED ORDER — ONDANSETRON HCL 4 MG PO TABS: 4.0000 mg | ORAL_TABLET | Freq: Four times a day (QID) | ORAL | Status: DC | PRN

## 2018-11-02 MED ORDER — BISACODYL 10 MG RE SUPP
10.0000 mg | Freq: Once | RECTAL | Status: AC
  Administered 2018-11-02: 10 mg via RECTAL
  Filled 2018-11-02: qty 1

## 2018-11-02 NOTE — Progress Notes (Signed)
PROGRESS NOTE    Jacqueline Leonard  MRN:[id number] DOB: 02/16/1946 DOA: 11/01/2018 PCP: Santiago, Irma M, MD   Brief Narrative: Jacqueline Leonard is a 72 y.o. female with with metastatic adenocarcinoma of the lung on Tagrisso who was recently admitted last month for pleural effusion underwent thoracentesis had a fall 2 weeks ago but since then patient has been having left shoulder pain.  Denies losing consciousness.  Patient has been having progressive peripheral edema for which patient was started on Lasix by patient's oncologist 2 days ago.  Due to the left shoulder pain patient came to the ER.  Patient also has been having poor appetite with some nausea denies vomiting or diarrhea.  Noticed increasing abdominal girth peripheral edema. Patient presented with shortness of breath, progressive weakness.  Assessment & Plan:   Principal Problem:   Failure to thrive in adult Active Problems:   Metastatic adenocarcinoma, pathology 06/19/12   Lower urinary tract infectious disease   Pleural effusion, left   Malnutrition of moderate degree   Generalized weakness   Traumatic closed nondisplaced fracture of acromial end of clavicle, left, initial encounter   Peripheral edema   Weakness  1 acute hypoxic respiratory failure; In the setting of pleural effusion, pulmonary edema. Continue with IV Lasix. For thoracentesis today.  2-failure to thrive, poor appetite, progressive weakness. Palliative care consult Ensure. Continue with medrol dose pack.   Left clavicle fracture continue with sling.  Follow-up outpatient Management  UTI: Continue with ceftriaxone UA with more than 50 white blood cells.  Metastatic non-small cell lung cancer , with positive EGFR, on requiring thoracentesis previously. Follow with Dr. Mohamed Anemia from malignancy: Follow hemoglobin  RN Pressure Injury Documentation:    Malnutrition Type:      Malnutrition Characteristics:      Nutrition Interventions:     Estimated body mass index is 26.07 kg/m as calculated from the following:   Height as of this encounter: 5' 1" (1.549 m).   Weight as of this encounter: 62.6 kg.   DVT prophylaxis: SCDs Code Status: DNR Family Communication: Care discussed with patient , no need for translator Disposition Plan: Remain the hospital for IV Lasix, IV antibiotics.  Poor oral intake.   Consultants:   Palliative care  Dr. Mohammed will be added to the rounding team  Procedures:  Thoracentesis 11/02/2018     Antimicrobials:  Ceftriaxone   Subjective: Patient feels very weak, tired.  Poor appetite.  Every time that she tried to eat she gets nauseous.  She is having shortness of breath, she appears in mild distress.  Objective: Vitals:   11/02/18 1030 11/02/18 1035 11/02/18 1040 11/02/18 1431  BP: 90/76 (!) 107/43 (!) 109/37 (!) 141/59  Pulse:    93  Resp:    18  Temp:    98.1 F (36.7 C)  TempSrc:    Oral  SpO2:    100%  Weight:      Height:        Intake/Output Summary (Last 24 hours) at 11/02/2018 1714 Last data filed at 11/01/2018 2130 Gross per 24 hour  Intake 99.27 ml  Output -  Net 99.27 ml   Filed Weights   11/01/18 1454  Weight: 62.6 kg    Examination:  General exam: Mild distress thin appearing. Respiratory system: Lateral crackles, increased work of breathing. Cardiovascular system: S1 & S2 heard, RRR. No JVD, murmurs, rubs, gallops or clicks. No pedal edema. Gastrointestinal system: Abdomen is nondistended, soft and nontender. No organomegaly or masses felt.   Normal bowel sounds heard. Central nervous system: Alert and oriented. No focal neurological deficits. Extremities: Symmetric 5 x 5 power. Skin: No rashes, lesions or ulcers   Data Reviewed: I have personally reviewed following labs and imaging studies  CBC: Recent Labs  Lab 10/28/18 0920 11/01/18 1507 11/02/18 0622  WBC 5.1 10.2 6.6  NEUTROABS 4.1  --   --   HGB 9.4* 10.0* 8.3*  HCT 30.3* 33.7*  27.7*  MCV 93.2 96.6 96.5  PLT 129* 128* 616*   Basic Metabolic Panel: Recent Labs  Lab 10/28/18 0920 11/01/18 1507 11/02/18 0622  NA 142 142 142  K 4.5 4.0 3.9  CL 108 106 108  CO2 _0 GLUCOSE 93 109* 82  BUN 40* 40* 38*  CREATININE 0.83 0.85 0.77  CALCIUM 8.6* 7.8* 7.2*   GFR: Estimated Creatinine Clearance: 52.3 mL/min (by C-G formula based on SCr of 0.77 mg/dL). Liver Function Tests: Recent Labs  Lab 10/28/18 0920 11/01/18 1507  AST 24 27  ALT 15 15  ALKPHOS 125 113  BILITOT 0.6 0.8  PROT 5.9* 6.1*  ALBUMIN 2.5* 2.7*   Recent Labs  Lab 11/01/18 1507  LIPASE 30   No results for input(s): AMMONIA in the last 168 hours. Coagulation Profile: No results for input(s): INR, PROTIME in the last 168 hours. Cardiac Enzymes: Recent Labs  Lab 11/01/18 1507  TROPONINI <0.03   BNP (last 3 results) No results for input(s): PROBNP in the last 8760 hours. HbA1C: No results for input(s): HGBA1C in the last 72 hours. CBG: Recent Labs  Lab 11/01/18 1631  GLUCAP 85   Lipid Profile: No results for input(s): CHOL, HDL, LDLCALC, TRIG, CHOLHDL, LDLDIRECT in the last 72 hours. Thyroid Function Tests: No results for input(s): TSH, T4TOTAL, FREET4, T3FREE, THYROIDAB in the last 72 hours. Anemia Panel: No results for input(s): VITAMINB12, FOLATE, FERRITIN, TIBC, IRON, RETICCTPCT in the last 72 hours. Sepsis Labs: No results for input(s): PROCALCITON, LATICACIDVEN in the last 168 hours.  No results found for this or any previous visit (from the past 240 hour(s)).       Radiology Studies: Dg Chest 1 View  Result Date: 11/02/2018 CLINICAL DATA:  72 year old female status post left thoracentesis EXAM: CHEST  1 VIEW COMPARISON:  Pre thoracentesis chest x-ray 11/01/2018 FINDINGS: Right subclavian approach single-lumen power injectable port catheter remains in good position with the tip at the superior cavoatrial junction. Interval reduction in volume of the left  pleural effusion. No evidence of pneumothorax. There is persistent diffuse left basilar airspace opacity which may reflect a combination of residual pleural fluid, atelectasis and/or infiltrate. Post radiation changes in the right lung apex are stable. No acute osseous abnormality. IMPRESSION: No evidence of pneumothorax following left-sided thoracentesis. Persistent left basilar opacity may reflect a combination of residual pleural fluid with atelectasis and/or infiltrate. Electronically Signed   By: Jacqulynn Cadet M.D.   On: 11/02/2018 11:34   Dg Chest 2 View  Result Date: 11/01/2018 CLINICAL DATA:  Pt is currently taking 2 chemo meds. Pt has had emesis every time she eats. Pt denies diarrhea and nausea at this time. Pt has been unable to eat. Pt had infusion on Monday, and did not tell her oncologist about these symptoms. P.*comment was truncated* EXAM: CHEST - 2 VIEW COMPARISON:  Chest radiograph 10/08/2018, CT 09/05/2018 FINDINGS: Port in the anterior chest wall with tip in distal SVC. Chronic consolidation in the RIGHT upper lobe not changed. Chronic LEFT pleural effusion is unchanged.  Slight increase in airspace disease in LEFT lung and RIGHT lung. No aggressive osseous lesion. IMPRESSION: 1. Increase in bilateral airspace opacities could represent pulmonary edema or infection. 2. Stable chronic consolidation in the RIGHT upper lobe and LEFT pleural effusion/atelectasis Electronically Signed   By: Suzy Bouchard M.D.   On: 11/01/2018 23:11   Dg Abd 1 View  Result Date: 11/01/2018 CLINICAL DATA:  72 year old female with nausea. History of metastatic lung cancer. EXAM: ABDOMEN - 1 VIEW COMPARISON:  CT of the abdomen pelvis dated 09/06/2018 FINDINGS: There is no bowel dilatation or evidence of obstruction. No free air or radiopaque calculi. There is paucity of small bowel air. There is loss of tissue plane and delineation of organs, likely related to ascites and carcinomatosis seen on the CT. There  is a 2.5 cm calcified uterine fibroid. Osteopenia with degenerative changes of the spine. No acute osseous pathology. IMPRESSION: No findings of bowel obstruction. Electronically Signed   By: Anner Crete M.D.   On: 11/01/2018 23:07   Dg Shoulder Left  Result Date: 11/01/2018 CLINICAL DATA:  72 year old female with left shoulder pain EXAM: LEFT SHOULDER - 2+ VIEW COMPARISON:  Chest x-ray 10/08/2018 FINDINGS: Acute fracture of the distal clavicle, nondisplaced. Glenohumeral joint is congruent. Degenerative changes at the acromioclavicular joint. Left-sided pleural effusion with reticular changes of the lungs. Port catheter is new from the prior chest x-ray IMPRESSION: Acute fracture distal left clavicle. Left-sided pleural effusion, which was present on prior chest x-ray. Electronically Signed   By: Corrie Mckusick D.O.   On: 11/01/2018 15:32   US Thoracentesis Asp Pleural Space W/img Guide  Result Date: 11/02/2018 Lynnea Ferrier     11/02/2018 11:45 AM PROCEDURE SUMMARY: Successful image-guided left thoracentesis. Yielded 600 milliliters of bloody fluid. Patient tolerated procedure well. No immediate complications. Specimen was not sent for labs. Post procedure CXR shows no pneumothorax. Please see imaging section of Epic for full dictation. Joaquim Nam PA-C 11/02/2018 10:44 AM        Scheduled Meds: . feeding supplement (ENSURE ENLIVE)  237 mL Oral TID BM  . furosemide  20 mg Intravenous BID  . lidocaine      . mirtazapine  7.5 mg Oral QHS  . senna-docusate  1 tablet Oral BID   Continuous Infusions: . cefTRIAXone (ROCEPHIN)  IV       LOS: 0 days    Time spent: 35 minutes.     Elmarie Shiley, MD [facility] Pager [phone]  If 7PM-7AM, please contact night-coverage [email or web address] Password [email or web address] 11/02/2018, 5:14 PM

## 2018-11-02 NOTE — Progress Notes (Signed)
MEDICATION RELATED CONSULT NOTE - INITIAL   Pharmacy Consult for Osimertinib [place]) Indication: Oral Chemo  Allergies  Allergen Reactions  . Vancomycin Other (See Comments)    Red face and chest  . Penicillins Palpitations and Other (See Comments)    Has patient had a PCN reaction causing immediate rash, facial/tongue/throat swelling, SOB or lightheadedness with hypotension: No Has patient had a PCN reaction causing severe rash involving mucus membranes or skin necrosis: No Has patient had a PCN reaction that required hospitalization No Has patient had a PCN reaction occurring within the last 10 years: Yes If all of the above answers are "NO", then may proceed with Cephalosporin use.    Patient Measurements: Height: 5\' 1"  (154.9 cm) Weight: 138 lb (62.6 kg) IBW/kg (Calculated) : 47.8 Adjusted Body Weight:   Vital Signs: Temp: 98.1 F (36.7 C) (12/06 2304) Temp Source: Oral (12/06 2304) BP: 134/58 (12/06 2304) Pulse Rate: 98 (12/06 2304) Intake/Output from previous day: 12/06 0701 - 12/07 0700 In: 99.3 [IV Piggyback:99.3] Out: -  Intake/Output from this shift: Total I/O In: 99.3 [IV Piggyback:99.3] Out: -   Labs: Recent Labs    11/01/18 1507  WBC 10.2  HGB 10.0*  HCT 33.7*  PLT 128*  CREATININE 0.85  ALBUMIN 2.7*  PROT 6.1*  AST 27  ALT 15  ALKPHOS 113  BILITOT 0.8  BILIDIR 0.2  IBILI 0.6   Estimated Creatinine Clearance: 49.2 mL/min (by C-G formula based on SCr of 0.85 mg/dL).   Microbiology: Recent Results (from the past 720 hour(s))  Culture, body fluid-bottle     Status: None   Collection Time: 10/08/18  2:48 PM  Result Value Ref Range Status   Specimen Description PLEURAL  Final   Special Requests NONE  Final   Culture   Final    NO GROWTH 5 DAYS Performed at [facility], [address], [place], [place] [address]    Report Status 10/13/2018 FINAL  Final  Gram stain     Status: None   Collection Time: 10/08/18  2:48 PM  Result  Value Ref Range Status   Specimen Description PLEURAL  Final   Special Requests NONE  Final   Gram Stain   Final    WBC PRESENT,BOTH PMN AND MONONUCLEAR NO ORGANISMS SEEN CYTOSPIN SMEAR Performed at [facility], [address] [place], [place] [address]    Report Status 10/08/2018 FINAL  Final    Medical History: Past Medical History:  Diagnosis Date  . Allergy   . Anxiety   . External hemorrhoids   . Family history of breast cancer   . Family history of lung cancer   . Hyperlipidemia   . Hypertension   . Lung cancer metastatic to bone ([place]) 06/19/2012   bx=L 5th ribmetastatic Adenocarcinoma with known lung mass  . Radiation 07/11/12-07/24/12   Palliative lung tx 30 gray in 10 fx  . S/P radiation therapy 07/22/14-07/31/14   left lung/60gy/82fx  . Status post chemoradiation    Tarceva  . Vitamin D deficiency     Medications:  Medications Prior to Admission  Medication Sig Dispense Refill Last Dose  . cholecalciferol (VITAMIN D) 1000 UNITS tablet Take 1,000 Units by mouth daily.   10/31/2018 at Unknown time  . furosemide (LASIX) 20 MG tablet Take 1 tablet (20 mg total) by mouth daily. 30 tablet 0 Past Week at Unknown time  . HYDROcodone-homatropine (HYCODAN) 5-1.5 MG/5ML syrup Take 5 mLs by mouth every 6 (six) hours  as needed for cough. 120 mL 0 10/31/2018 at Unknown time  . osimertinib mesylate (TAGRISSO) 80 MG tablet Take 1 tablet (80 mg total) by mouth daily. 30 tablet 2 11/01/2018 at 0800  . escitalopram (LEXAPRO) 10 MG tablet Take 1 tablet (10 mg total) by mouth daily. (Patient taking differently: Take 10 mg by mouth daily as needed (depression). ) 30 tablet 0 unknown  . lidocaine-prilocaine (EMLA) cream Apply 1 application topically as needed. (Patient taking differently: Apply 1 application topically daily as needed (access port). ) 30 g 0   . methylPREDNISolone (MEDROL DOSEPAK) 4 MG TBPK tablet Use as instructed. (Patient not taking: Reported on 11/01/2018) 21 tablet 0  Completed Course at Unknown time  . mirtazapine (REMERON) 30 MG tablet Take 1 tablet (30 mg total) by mouth at bedtime. (Patient not taking: Reported on 11/01/2018) 30 tablet 2 Not Taking at Unknown time  . mirtazapine (REMERON) 7.5 MG tablet TAKE 1 TABLET(7.5 MG) BY MOUTH AT BEDTIME (Patient taking differently: Take 7.5 mg by mouth at bedtime. ) 30 tablet 0 unknown  . ondansetron (ZOFRAN) 4 MG tablet Take 1 tablet (4 mg total) by mouth every 6 (six) hours as needed for nausea. 20 tablet 0 unknown  . Phenyleph-Doxylamine-DM-APAP (NYQUIL SEVERE COLD/FLU PO) Take 5 mLs by mouth daily as needed (cough). Once at bedtime    unknown  . predniSONE (DELTASONE) 5 MG tablet Take 1 tablet (5 mg total) by mouth daily with breakfast. (Patient not taking: Reported on 11/01/2018) 20 tablet 0 Completed Course at Unknown time   Scheduled:  . furosemide  20 mg Oral Daily  . mirtazapine  7.5 mg Oral QHS    Assessment: Patient on Osimertinib (Tagrisso) prior to admission.   Osimertinib (Tagrisso) Hold Criteria: . Interstitial lung disease/pneumonitis . QTc interval > 500 msec on at least 2 separate ECGs . QTc interval prolongation with symptoms of life-threatening arrhythmia . Asymptomatic absolute decrease in LVEF of 10% from baseline and below 50% . Symptomatic CHF . Grade 3 or higher ADE . Active infection    Goal of Therapy:  Safe and effective use of Osimertinib (Tagrisso).  Plan:  D/c Osimertinib (Tagrisso) at this time due to active infection  Tyler Deis, [place] 11/02/2018,12:12 AM

## 2018-11-02 NOTE — H&P (Addendum)
History and Physical    Jacqueline Leonard FAO:[id number] DOB: 18-Mar-1946 DOA: 11/01/2018  PCP: Rutherford Guys, MD  Patient coming from: Home.  Chief Complaint: Weakness.  Left shoulder pain.  HPI: Jacqueline Leonard is a 72 y.o. female with with metastatic adenocarcinoma of the lung on Tagrisso who was recently admitted last month for pleural effusion underwent thoracentesis had a fall 2 weeks ago but since then patient has been having left shoulder pain.  Denies losing consciousness.  Patient has been having progressive peripheral edema for which patient was started on Lasix by patient's oncologist 2 days ago.  Due to the left shoulder pain patient came to the ER.  Patient also has been having poor appetite with some nausea denies vomiting or diarrhea.  Noticed increasing abdominal girth peripheral edema.  ED Course: In the ER x-rays revealed left clavicle fracture for which patient has been placed on a sling.  UA shows UTI.  On exam patient has significant lower extremity edema.  Review of Systems: As per HPI, rest all negative.   Past Medical History:  Diagnosis Date  . Allergy   . Anxiety   . External hemorrhoids   . Family history of breast cancer   . Family history of lung cancer   . Hyperlipidemia   . Hypertension   . Lung cancer metastatic to bone ([place]) 06/19/2012   bx=L 5th ribmetastatic Adenocarcinoma with known lung mass  . Radiation 07/11/12-07/24/12   Palliative lung tx 30 gray in 10 fx  . S/P radiation therapy 07/22/14-07/31/14   left lung/60gy/6fx  . Status post chemoradiation    Tarceva  . Vitamin D deficiency     Past Surgical History:  Procedure Laterality Date  . APPENDECTOMY     done at time of ovarian surgery, not ruptured.  . IR IMAGING GUIDED PORT INSERTION  10/18/2018  . OVARIAN CYST REMOVAL     twice - 1970's and 1980's  . SOFT TISSUE BIOPSY  06/19/12   L 5th rib=metastatic adenocarcinoma     reports that she has never smoked. She has never used smokeless  tobacco. She reports that she does not drink alcohol or use drugs.  Allergies  Allergen Reactions  . Vancomycin Other (See Comments)    Red face and chest  . Penicillins Palpitations and Other (See Comments)    Has patient had a PCN reaction causing immediate rash, facial/tongue/throat swelling, SOB or lightheadedness with hypotension: No Has patient had a PCN reaction causing severe rash involving mucus membranes or skin necrosis: No Has patient had a PCN reaction that required hospitalization No Has patient had a PCN reaction occurring within the last 10 years: Yes If all of the above answers are "NO", then may proceed with Cephalosporin use.    Family History  Problem Relation Age of Onset  . Lung cancer Brother   . Breast cancer Sister        dx early 68s, d 46  . Breast cancer Sister 27       d 66s    Prior to Admission medications   Medication Sig Start Date End Date Taking? Authorizing Provider  cholecalciferol (VITAMIN D) 1000 UNITS tablet Take 1,000 Units by mouth daily.   Yes [provider]  furosemide (LASIX) 20 MG tablet Take 1 tablet (20 mg total) by mouth daily. 10/28/18  Yes Curt Bears, MD  HYDROcodone-homatropine [facility]) 5-1.5 MG/5ML syrup Take 5 mLs by mouth every 6 (six) hours as needed for cough. 10/28/18  Yes Curt Bears,  MD  osimertinib mesylate (TAGRISSO) 80 MG tablet Take 1 tablet (80 mg total) by mouth daily. 08/12/18  Yes Curcio, Roselie Awkward, NP  escitalopram (LEXAPRO) 10 MG tablet Take 1 tablet (10 mg total) by mouth daily. Patient taking differently: Take 10 mg by mouth daily as needed (depression).  09/08/18   Georgette Shell, MD  lidocaine-prilocaine (EMLA) cream Apply 1 application topically as needed. Patient taking differently: Apply 1 application topically daily as needed (access port).  10/28/18   Curt Bears, MD  methylPREDNISolone (MEDROL DOSEPAK) 4 MG TBPK tablet Use as instructed. Patient not taking: Reported on  11/01/2018 10/15/18   Curt Bears, MD  mirtazapine (REMERON) 30 MG tablet Take 1 tablet (30 mg total) by mouth at bedtime. Patient not taking: Reported on 11/01/2018 08/21/18   Rutherford Guys, MD  mirtazapine (REMERON) 7.5 MG tablet TAKE 1 TABLET(7.5 MG) BY MOUTH AT BEDTIME Patient taking differently: Take 7.5 mg by mouth at bedtime.  11/01/18   Rutherford Guys, MD  ondansetron (ZOFRAN) 4 MG tablet Take 1 tablet (4 mg total) by mouth every 6 (six) hours as needed for nausea. 09/07/18   Georgette Shell, MD  Phenyleph-Doxylamine-DM-APAP (NYQUIL SEVERE COLD/FLU PO) Take 5 mLs by mouth daily as needed (cough). Once at bedtime     [provider]  predniSONE (DELTASONE) 5 MG tablet Take 1 tablet (5 mg total) by mouth daily with breakfast. Patient not taking: Reported on 11/01/2018 09/08/18   Georgette Shell, MD    Physical Exam: Vitals:   11/01/18 2130 11/01/18 2200 11/01/18 2230 11/01/18 2304  BP: (!) 133/43 (!) 139/48 138/65 (!) 134/58  Pulse:  95 97 98  Resp: (!) 26 (!) 33 (!) 31 (!) 40  Temp:    98.1 F (36.7 C)  TempSrc:    Oral  SpO2: 96% 96% 96% 97%  Weight:      Height:          Constitutional: Moderately built and nourished. Vitals:   11/01/18 2130 11/01/18 2200 11/01/18 2230 11/01/18 2304  BP: (!) 133/43 (!) 139/48 138/65 (!) 134/58  Pulse:  95 97 98  Resp: (!) 26 (!) 33 (!) 31 (!) 40  Temp:    98.1 F (36.7 C)  TempSrc:    Oral  SpO2: 96% 96% 96% 97%  Weight:      Height:       Eyes: Anicteric no pallor. ENMT: No discharge from the ears eyes nose or mouth. Neck: No mass or.  No neck rigidity. Respiratory: No rhonchi or crepitations. Cardiovascular: S1-S2 heard no murmurs appreciated. Abdomen: Mildly distended nontender bowel sounds present. Musculoskeletal: Pain on moving left upper extremity. Skin: No rash. Neurologic: Alert awake oriented to time place and person.  Moves all extremities. Psychiatric: Appears normal.   Labs on  Admission: I have personally reviewed following labs and imaging studies  CBC: Recent Labs  Lab 10/28/18 0920 11/01/18 1507  WBC 5.1 10.2  NEUTROABS 4.1  --   HGB 9.4* 10.0*  HCT 30.3* 33.7*  MCV 93.2 96.6  PLT 129* 433*   Basic Metabolic Panel: Recent Labs  Lab 10/28/18 0920 11/01/18 1507  NA 142 142  K 4.5 4.0  CL 108 106  CO2 25 26  GLUCOSE 93 109*  BUN 40* 40*  CREATININE 0.83 0.85  CALCIUM 8.6* 7.8*   GFR: Estimated Creatinine Clearance: 49.2 mL/min (by C-G formula based on SCr of 0.85 mg/dL). Liver Function Tests: Recent Labs  Lab 10/28/18 0920  11/01/18 1507  AST 24 27  ALT 15 15  ALKPHOS 125 113  BILITOT 0.6 0.8  PROT 5.9* 6.1*  ALBUMIN 2.5* 2.7*   Recent Labs  Lab 11/01/18 1507  LIPASE 30   No results for input(s): AMMONIA in the last 168 hours. Coagulation Profile: No results for input(s): INR, PROTIME in the last 168 hours. Cardiac Enzymes: Recent Labs  Lab 11/01/18 1507  TROPONINI <0.03   BNP (last 3 results) No results for input(s): PROBNP in the last 8760 hours. HbA1C: No results for input(s): HGBA1C in the last 72 hours. CBG: Recent Labs  Lab 11/01/18 1631  GLUCAP 85   Lipid Profile: No results for input(s): CHOL, HDL, LDLCALC, TRIG, CHOLHDL, LDLDIRECT in the last 72 hours. Thyroid Function Tests: No results for input(s): TSH, T4TOTAL, FREET4, T3FREE, THYROIDAB in the last 72 hours. Anemia Panel: No results for input(s): VITAMINB12, FOLATE, FERRITIN, TIBC, IRON, RETICCTPCT in the last 72 hours. Urine analysis:    Component Value Date/Time   COLORURINE YELLOW 11/01/2018 1900   APPEARANCEUR CLOUDY (A) 11/01/2018 1900   LABSPEC 1.025 11/01/2018 1900   LABSPEC 1.010 10/10/2013 1442   PHURINE 6.5 11/01/2018 1900   GLUCOSEU NEGATIVE 11/01/2018 1900   GLUCOSEU Negative 10/10/2013 1442   HGBUR LARGE (A) 11/01/2018 1900   BILIRUBINUR NEGATIVE 11/01/2018 1900   BILIRUBINUR Negative 10/10/2013 1442   KETONESUR 15 (A) 11/01/2018  1900   PROTEINUR 100 (A) 11/01/2018 1900   UROBILINOGEN 0.2 10/10/2013 1442   NITRITE NEGATIVE 11/01/2018 1900   LEUKOCYTESUR MODERATE (A) 11/01/2018 1900   LEUKOCYTESUR Negative 10/10/2013 1442   Sepsis Labs: @LABRCNTIP (procalcitonin:4,lacticidven:4) )No results found for this or any previous visit (from the past 240 hour(s)).   Radiological Exams on Admission: Dg Chest 2 View  Result Date: 11/01/2018 CLINICAL DATA:  Pt is currently taking 2 chemo meds. Pt has had emesis every time she eats. Pt denies diarrhea and nausea at this time. Pt has been unable to eat. Pt had infusion on Monday, and did not tell her oncologist about these symptoms. P.*comment was truncated* EXAM: CHEST - 2 VIEW COMPARISON:  Chest radiograph 10/08/2018, CT 09/05/2018 FINDINGS: Port in the anterior chest wall with tip in distal SVC. Chronic consolidation in the RIGHT upper lobe not changed. Chronic LEFT pleural effusion is unchanged. Slight increase in airspace disease in LEFT lung and RIGHT lung. No aggressive osseous lesion. IMPRESSION: 1. Increase in bilateral airspace opacities could represent pulmonary edema or infection. 2. Stable chronic consolidation in the RIGHT upper lobe and LEFT pleural effusion/atelectasis Electronically Signed   By: Suzy Bouchard M.D.   On: 11/01/2018 23:11   Dg Abd 1 View  Result Date: 11/01/2018 CLINICAL DATA:  72 year old female with nausea. History of metastatic lung cancer. EXAM: ABDOMEN - 1 VIEW COMPARISON:  CT of the abdomen pelvis dated 09/06/2018 FINDINGS: There is no bowel dilatation or evidence of obstruction. No free air or radiopaque calculi. There is paucity of small bowel air. There is loss of tissue plane and delineation of organs, likely related to ascites and carcinomatosis seen on the CT. There is a 2.5 cm calcified uterine fibroid. Osteopenia with degenerative changes of the spine. No acute osseous pathology. IMPRESSION: No findings of bowel obstruction. Electronically  Signed   By: Anner Crete M.D.   On: 11/01/2018 23:07   Dg Shoulder Left  Result Date: 11/01/2018 CLINICAL DATA:  72 year old female with left shoulder pain EXAM: LEFT SHOULDER - 2+ VIEW COMPARISON:  Chest x-ray 10/08/2018 FINDINGS: Acute fracture  of the distal clavicle, nondisplaced. Glenohumeral joint is congruent. Degenerative changes at the acromioclavicular joint. Left-sided pleural effusion with reticular changes of the lungs. Port catheter is new from the prior chest x-ray IMPRESSION: Acute fracture distal left clavicle. Left-sided pleural effusion, which was present on prior chest x-ray. Electronically Signed   By: Corrie Mckusick D.O.   On: 11/01/2018 15:32     Assessment/Plan Principal Problem:   Failure to thrive in adult Active Problems:   Metastatic adenocarcinoma, pathology 06/19/12   Lower urinary tract infectious disease   Pleural effusion, left   Malnutrition of moderate degree   Generalized weakness   Traumatic closed nondisplaced fracture of acromial end of clavicle, left, initial encounter   Peripheral edema    1. Failure to thrive with poor appetite progressive weakness likely from progression of her malignancy for which I have requested hospice consult. 2. Left clavicle fracture on the sling at this time.  Further work-up as outpatient. 3. UTI on ceftriaxone. 4. Metastatic adenocarcinoma requiring thoracentesis previously.  Will request thoracentesis in the morning. 5. Anemia likely from malignancy follow CBC. 6. Peripheral edema on Lasix which will be continued.  Likely cause is third spacing from poor nutrition. 7. Severe protein calorie malnutrition.  I have requested hospice consult for goals. May need to discuss with patient's oncologist with regarding the patient's further care as patient looks like patient is getting more weak fatigue and all these from progression of malignancy  Note that patient's chest x-ray does show some consolidation but patient has  no productive cough fever or chills.  However patient is already on ceftriaxone for UTI.   DVT prophylaxis: SCDs in anticipation of possible thoracentesis. Code Status: DNR as confirmed with patient's niece who provided the translation. Family Communication: Patient's niece. Disposition Plan: To be determined. Consults called: None. Admission status: Observation.   Rise Patience MD [facility] Pager [phone].  If 7PM-7AM, please contact night-coverage [email or web address] Password [email or web address]  11/02/2018, 12:07 AM

## 2018-11-02 NOTE — Procedures (Signed)
PROCEDURE SUMMARY:  Successful image-guided left thoracentesis. Yielded 600 milliliters of bloody fluid. Patient tolerated procedure well. No immediate complications.  Specimen was not sent for labs.  Post procedure CXR shows no pneumothorax.  Please see imaging section of Epic for full dictation.  Joaquim Nam PA-C 11/02/2018 10:44 AM

## 2018-11-02 NOTE — Progress Notes (Signed)
Please communicate treatment info to niece Hilton Cork @ [phone]. Pt does speak and understand English, however she may not understand everything that is explained to her.

## 2018-11-03 DIAGNOSIS — Z7189 Other specified counseling: Secondary | ICD-10-CM

## 2018-11-03 DIAGNOSIS — Z515 Encounter for palliative care: Secondary | ICD-10-CM

## 2018-11-03 LAB — BASIC METABOLIC PANEL
Anion gap: 8 (ref 5–15)
BUN: 41 mg/dL — ABNORMAL HIGH (ref 8–23)
CO2: 26 mmol/L (ref 22–32)
Calcium: 7.3 mg/dL — ABNORMAL LOW (ref 8.9–10.3)
Chloride: 107 mmol/L (ref 98–111)
Creatinine, Ser: 0.94 mg/dL (ref 0.44–1.00)
GFR calc Af Amer: >60 mL/min (ref >60)
GFR calc non Af Amer: 60 mL/min — ABNORMAL LOW (ref >60)
Glucose, Bld: 128 mg/dL — ABNORMAL HIGH (ref 70–99)
Potassium: 4.8 mmol/L (ref 3.5–5.1)
Sodium: 141 mmol/L (ref 135–145)

## 2018-11-03 LAB — CBC
HCT: 31.5 % — ABNORMAL LOW (ref 36.0–46.0)
Hemoglobin: 9.4 g/dL — ABNORMAL LOW (ref 12.0–15.0)
MCH: 28.9 pg (ref 26.0–34.0)
MCHC: 29.8 g/dL — AB (ref 30.0–36.0)
MCV: 96.9 fL (ref 80.0–100.0)
Platelets: 121 10*3/uL — ABNORMAL LOW (ref 150–400)
RBC: 3.25 MIL/uL — ABNORMAL LOW (ref 3.87–5.11)
RDW: 17.3 % — ABNORMAL HIGH (ref 11.5–15.5)
WBC: 15.1 10*3/uL — ABNORMAL HIGH (ref 4.0–10.5)
nRBC: 0 % (ref 0.0–0.2)

## 2018-11-03 MED ORDER — ALTEPLASE 2 MG IJ SOLR
2.0000 mg | Freq: Once | INTRAMUSCULAR | Status: AC
  Administered 2018-11-03: 2 mg
  Filled 2018-11-03: qty 2

## 2018-11-03 MED ORDER — FAMOTIDINE IN NACL 20-0.9 MG/50ML-% IV SOLN
20.0000 mg | Freq: Two times a day (BID) | INTRAVENOUS | Status: DC
  Administered 2018-11-03: 20 mg via INTRAVENOUS
  Filled 2018-11-03 (×2): qty 50

## 2018-11-03 MED ORDER — METOCLOPRAMIDE HCL 5 MG PO TABS
5.0000 mg | ORAL_TABLET | Freq: Three times a day (TID) | ORAL | Status: DC
  Administered 2018-11-03 – 2018-11-06 (×12): 5 mg via ORAL
  Filled 2018-11-03 (×13): qty 1

## 2018-11-03 MED ORDER — ALUM & MAG HYDROXIDE-SIMETH 200-200-20 MG/5ML PO SUSP
15.0000 mL | Freq: Four times a day (QID) | ORAL | Status: DC | PRN
  Administered 2018-11-03 (×2): 15 mL via ORAL
  Filled 2018-11-03 (×2): qty 30

## 2018-11-03 NOTE — Plan of Care (Signed)
  Problem: Health Behavior/Discharge Planning: Goal: Ability to manage health-related needs will improve Outcome: Progressing   Problem: Elimination: Goal: Will not experience complications related to urinary retention Outcome: Progressing   Problem: Safety: Goal: Ability to remain free from injury will improve Outcome: Progressing

## 2018-11-03 NOTE — Progress Notes (Signed)
PROGRESS NOTE    Jacqueline Leonard  VWA:[id number] DOB: [birth year] DOA: 11/01/2018 PCP: Rutherford Guys, MD   Brief Narrative: Jacqueline Leonard is a 72 y.o. female with with metastatic adenocarcinoma of the lung on Tagrisso who was recently admitted last month for pleural effusion underwent thoracentesis had a fall 2 weeks ago but since then patient has been having left shoulder pain.  Denies losing consciousness.  Patient has been having progressive peripheral edema for which patient was started on Lasix by patient's oncologist 2 days ago.  Due to the left shoulder pain patient came to the ER.  Patient also has been having poor appetite with some nausea denies vomiting or diarrhea.  Noticed increasing abdominal girth peripheral edema. Patient presented with shortness of breath, progressive weakness.  Assessment & Plan:   Principal Problem:   Failure to thrive in adult Active Problems:   Metastatic adenocarcinoma, pathology 06/19/12   Lower urinary tract infectious disease   Pleural effusion, left   Malnutrition of moderate degree   Generalized weakness   Traumatic closed nondisplaced fracture of acromial end of clavicle, left, initial encounter   Peripheral edema   Weakness  1 Acute hypoxic respiratory failure; In the setting of pleural effusion, pulmonary edema. Continue with IV Lasix. Had  thoracentesis 12-07, 600 cc fluids removed.   2-failure to thrive, poor appetite, progressive weakness. Palliative care consult Ensure. Continue with medrol dose pack.   Left clavicle fracture continue with sling.  Follow-up outpatient Pain Management  UTI: Continue with ceftriaxone UA: With more than 50 white blood cells.  Metastatic non-small cell lung cancer , with positive EGFR, on requiring thoracentesis previously. Follow with Dr. Julien Nordmann.  Anemia from malignancy: Follow hemoglobin Hb stable.  Nurse report one small episode of dark stool. Will monitor closely. Patient already on IV  pepcid.   Nutrition;  Will start reglan to her with early satiety.   RN Pressure Injury Documentation:    Malnutrition Type:      Malnutrition Characteristics:      Nutrition Interventions:     Estimated body mass index is 26.07 kg/m as calculated from the following:   Height as of this encounter: 5' 1"  (1.549 m).   Weight as of this encounter: 62.6 kg.   DVT prophylaxis: SCDs Code Status: DNR Family Communication: Care discussed with patient , no need for translator Disposition Plan: Remain the hospital for IV Lasix, IV antibiotics.  Poor oral intake.   Consultants:   Palliative care  Dr. Earlie Server will be added to the rounding team  Procedures:  Thoracentesis 11/02/2018     Antimicrobials:  Ceftriaxone   Subjective: She is breathing better. She had multiples BM after suppository.  One small dark BM today.  She report early satiety.   Objective: Vitals:   11/02/18 1431 11/02/18 2123 11/03/18 0542 11/03/18 1209  BP: (!) 141/59 (!) 145/56 (!) 136/59 137/63  Pulse: 93 (!) 118 (!) 106 (!) 106  Resp: 18 (!) 30 (!) 26 16  Temp: 98.1 F (36.7 C) 99.1 F (37.3 C) 98 F (36.7 C) 98 F (36.7 C)  TempSrc: Oral Oral Oral Oral  SpO2: 100% 100% 100% 99%  Weight:      Height:        Intake/Output Summary (Last 24 hours) at 11/03/2018 1338 Last data filed at 11/03/2018 0657 Gross per 24 hour  Intake 100 ml  Output 450 ml  Net -350 ml   Filed Weights   11/01/18 1454  Weight: 62.6 kg  Examination:  General exam: Less distress Respiratory system; bilateral crackles Cardiovascular system: S1, S2 regular rhythm rate Gastrointestinal system: Bowel sounds present, soft nontender Central nervous system: Alert and oriented Extremities: symmetric power Skin: No rashes, lesions or ulcers   Data Reviewed: I have personally reviewed following labs and imaging studies  CBC: Recent Labs  Lab 10/28/18 0920 11/01/18 1507 11/02/18 0622  11/03/18 0924  WBC 5.1 10.2 6.6 15.1*  NEUTROABS 4.1  --   --   --   HGB 9.4* 10.0* 8.3* 9.4*  HCT 30.3* 33.7* 27.7* 31.5*  MCV 93.2 96.6 96.5 96.9  PLT 129* 128* 102* 030*   Basic Metabolic Panel: Recent Labs  Lab 10/28/18 0920 11/01/18 1507 11/02/18 0622 11/03/18 0924  NA 142 142 142 141  K 4.5 4.0 3.9 4.8  CL 108 106 108 107  CO2 25 26 25 26   GLUCOSE 93 109* 82 128*  BUN 40* 40* 38* 41*  CREATININE 0.83 0.85 0.77 0.94  CALCIUM 8.6* 7.8* 7.2* 7.3*   GFR: Estimated Creatinine Clearance: 44.5 mL/min (by C-G formula based on SCr of 0.94 mg/dL). Liver Function Tests: Recent Labs  Lab 10/28/18 0920 11/01/18 1507  AST 24 27  ALT 15 15  ALKPHOS 125 113  BILITOT 0.6 0.8  PROT 5.9* 6.1*  ALBUMIN 2.5* 2.7*   Recent Labs  Lab 11/01/18 1507  LIPASE 30   No results for input(s): AMMONIA in the last 168 hours. Coagulation Profile: No results for input(s): INR, PROTIME in the last 168 hours. Cardiac Enzymes: Recent Labs  Lab 11/01/18 1507  TROPONINI <0.03   BNP (last 3 results) No results for input(s): PROBNP in the last 8760 hours. HbA1C: No results for input(s): HGBA1C in the last 72 hours. CBG: Recent Labs  Lab 11/01/18 1631  GLUCAP 85   Lipid Profile: No results for input(s): CHOL, HDL, LDLCALC, TRIG, CHOLHDL, LDLDIRECT in the last 72 hours. Thyroid Function Tests: No results for input(s): TSH, T4TOTAL, FREET4, T3FREE, THYROIDAB in the last 72 hours. Anemia Panel: No results for input(s): VITAMINB12, FOLATE, FERRITIN, TIBC, IRON, RETICCTPCT in the last 72 hours. Sepsis Labs: No results for input(s): PROCALCITON, LATICACIDVEN in the last 168 hours.  No results found for this or any previous visit (from the past 240 hour(s)).       Radiology Studies: Dg Chest 1 View  Result Date: 11/02/2018 CLINICAL DATA:  72 year old female status post left thoracentesis EXAM: CHEST  1 VIEW COMPARISON:  Pre thoracentesis chest x-ray 11/01/2018 FINDINGS: Right  subclavian approach single-lumen power injectable port catheter remains in good position with the tip at the superior cavoatrial junction. Interval reduction in volume of the left pleural effusion. No evidence of pneumothorax. There is persistent diffuse left basilar airspace opacity which may reflect a combination of residual pleural fluid, atelectasis and/or infiltrate. Post radiation changes in the right lung apex are stable. No acute osseous abnormality. IMPRESSION: No evidence of pneumothorax following left-sided thoracentesis. Persistent left basilar opacity may reflect a combination of residual pleural fluid with atelectasis and/or infiltrate. Electronically Signed   By: Jacqulynn Cadet M.D.   On: 11/02/2018 11:34   Dg Chest 2 View  Result Date: 11/01/2018 CLINICAL DATA:  Pt is currently taking 2 chemo meds. Pt has had emesis every time she eats. Pt denies diarrhea and nausea at this time. Pt has been unable to eat. Pt had infusion on Monday, and did not tell her oncologist about these symptoms. P.*comment was truncated* EXAM: CHEST - 2 VIEW COMPARISON:  Chest radiograph 10/08/2018, CT 09/05/2018 FINDINGS: Port in the anterior chest wall with tip in distal SVC. Chronic consolidation in the RIGHT upper lobe not changed. Chronic LEFT pleural effusion is unchanged. Slight increase in airspace disease in LEFT lung and RIGHT lung. No aggressive osseous lesion. IMPRESSION: 1. Increase in bilateral airspace opacities could represent pulmonary edema or infection. 2. Stable chronic consolidation in the RIGHT upper lobe and LEFT pleural effusion/atelectasis Electronically Signed   By: Suzy Bouchard M.D.   On: 11/01/2018 23:11   Dg Abd 1 View  Result Date: 11/01/2018 CLINICAL DATA:  72 year old female with nausea. History of metastatic lung cancer. EXAM: ABDOMEN - 1 VIEW COMPARISON:  CT of the abdomen pelvis dated 09/06/2018 FINDINGS: There is no bowel dilatation or evidence of obstruction. No free air or  radiopaque calculi. There is paucity of small bowel air. There is loss of tissue plane and delineation of organs, likely related to ascites and carcinomatosis seen on the CT. There is a 2.5 cm calcified uterine fibroid. Osteopenia with degenerative changes of the spine. No acute osseous pathology. IMPRESSION: No findings of bowel obstruction. Electronically Signed   By: Anner Crete M.D.   On: 11/01/2018 23:07   Dg Shoulder Left  Result Date: 11/01/2018 CLINICAL DATA:  72 year old female with left shoulder pain EXAM: LEFT SHOULDER - 2+ VIEW COMPARISON:  Chest x-ray 10/08/2018 FINDINGS: Acute fracture of the distal clavicle, nondisplaced. Glenohumeral joint is congruent. Degenerative changes at the acromioclavicular joint. Left-sided pleural effusion with reticular changes of the lungs. Port catheter is new from the prior chest x-ray IMPRESSION: Acute fracture distal left clavicle. Left-sided pleural effusion, which was present on prior chest x-ray. Electronically Signed   By: Corrie Mckusick D.O.   On: 11/01/2018 15:32   US Thoracentesis Asp Pleural Space W/img Guide  Result Date: 11/02/2018 Lynnea Ferrier     11/02/2018 11:45 AM PROCEDURE SUMMARY: Successful image-guided left thoracentesis. Yielded 600 milliliters of bloody fluid. Patient tolerated procedure well. No immediate complications. Specimen was not sent for labs. Post procedure CXR shows no pneumothorax. Please see imaging section of Epic for full dictation. Joaquim Nam PA-C 11/02/2018 10:44 AM        Scheduled Meds: . feeding supplement (ENSURE ENLIVE)  237 mL Oral TID BM  . furosemide  20 mg Intravenous BID  . methylPREDNISolone  4 mg Oral PC supper  . methylPREDNISolone  4 mg Oral 3 x daily with food  . [START ON 11/04/2018] methylPREDNISolone  4 mg Oral 4X daily taper  . methylPREDNISolone  8 mg Oral Nightly  . mirtazapine  7.5 mg Oral QHS  . senna-docusate  1 tablet Oral BID   Continuous Infusions: .  cefTRIAXone (ROCEPHIN)  IV Stopped (11/02/18 2121)     LOS: 1 day    Time spent: 35 minutes.     Elmarie Shiley, MD [facility] Pager [phone]  If 7PM-7AM, please contact night-coverage [email or web address] Password [email or web address] 11/03/2018, 1:38 PM

## 2018-11-03 NOTE — Progress Notes (Signed)
Palliative Medicine Team consult was received.   I stopped by to check on Ms. Kizziah.  She appeared tired.  She reports wanting to have her niece present for any discussion.  We will schedule a meeting with patient and her family at the earliest possible time we have a provider available and family can meet.  I will reach out to her niece to schedule meeting.   If there are urgent needs or questions please call [phone]. Thank you for consulting out team to assist with this patients care.  Micheline Rough, MD [place] [facility]iative [facility] Team [phone]  NO CHARGE NOTE

## 2018-11-03 NOTE — Progress Notes (Signed)
Initial Nutrition Assessment  DOCUMENTATION CODES:   Non-severe (moderate) malnutrition in context of chronic illness  INTERVENTION:   Continue Ensure Enlive po TID, each supplement provides 350 kcal and 20 grams of protein  NUTRITION DIAGNOSIS:   Moderate Malnutrition related to chronic illness, cancer and cancer related treatments as evidenced by energy intake < or equal to 75% for > or equal to 1 month, mild fat depletion, moderate muscle depletion.  GOAL:   Patient will meet greater than or equal to 90% of their needs  MONITOR:   PO intake, Supplement acceptance, Labs, Weight trends, I & O's  REASON FOR ASSESSMENT:   Malnutrition Screening Tool    ASSESSMENT:   71 y.o. female with with metastatic adenocarcinoma of the lung on Tagrisso who was recently admitted last month for pleural effusion underwent thoracentesis had a fall 2 weeks ago but since then patient has been having left shoulder pain.Marland KitchenMarland KitchenPatient has been having progressive peripheral edema for which patient was started on Lasix by patient's oncologist 2 days ago.Marland KitchenMarland KitchenPatient presented with shortness of breath, progressive weakness.  Patient recently seen by nutrition team during previous admission in October. Pt at that time had reported poor PO and appetite for 2-3 weeks. Pt was ordered Ensure supplements. Pt's appetite has not improved and continues to have poor intakes. Ensure supplements have been ordered TID. Pt has nausea following eating.   Per weight records, pt has lost 2 lb since last assessment 10/10. Pt's UBW is 145 lb. Pt has been started on IV Lasix, so fluid may be masking true weight status.  Labs reviewed. Medications: IV Lasix BID, Reglan tablet QID, Remeron tablet daily, Senokot-S tablet BID  NUTRITION - FOCUSED PHYSICAL EXAM:    Most Recent Value  Orbital Region  Mild depletion  Upper Arm Region  Mild depletion  Thoracic and Lumbar Region  Unable to assess  Buccal Region  Mild depletion   Temple Region  Moderate depletion  Clavicle Bone Region  No depletion  Clavicle and Acromion Bone Region  No depletion  Scapular Bone Region  No depletion  Dorsal Hand  Moderate depletion  Patellar Region  Moderate depletion  Anterior Thigh Region  Moderate depletion  Posterior Calf Region  Moderate depletion  Edema (RD Assessment)  Moderate       Diet Order:   Diet Order            Diet regular Room service appropriate? Yes; Fluid consistency: Thin  Diet effective now              EDUCATION NEEDS:   No education needs have been identified at this time  Skin:  Skin Assessment: Reviewed RN Assessment  Last BM:  12/7  Height:   Ht Readings from Last 1 Encounters:  11/01/18 5\' 1"  (1.549 m)    Weight:   Wt Readings from Last 1 Encounters:  11/01/18 62.6 kg    Ideal Body Weight:  47.7 kg  BMI:  Body mass index is 26.07 kg/m.  Estimated Nutritional Needs:   Kcal:  1800-2000  Protein:  80-90g  Fluid:  1.8L/day   Clayton Bibles, MS, RD, LDN [place] Diet[facility] Pager: [phone] After Hours Pager: [phone]

## 2018-11-04 ENCOUNTER — Inpatient Hospital Stay (HOSPITAL_COMMUNITY): Payer: Medicare Other

## 2018-11-04 LAB — CBC
HCT: 29.9 % — ABNORMAL LOW (ref 36.0–46.0)
Hemoglobin: 9 g/dL — ABNORMAL LOW (ref 12.0–15.0)
MCH: 28.9 pg (ref 26.0–34.0)
MCHC: 30.1 g/dL (ref 30.0–36.0)
MCV: 96.1 fL (ref 80.0–100.0)
Platelets: 112 10*3/uL — ABNORMAL LOW (ref 150–400)
RBC: 3.11 MIL/uL — ABNORMAL LOW (ref 3.87–5.11)
RDW: 17.2 % — ABNORMAL HIGH (ref 11.5–15.5)
WBC: 12.8 10*3/uL — ABNORMAL HIGH (ref 4.0–10.5)
nRBC: 0 % (ref 0.0–0.2)

## 2018-11-04 MED ORDER — FAMOTIDINE 20 MG PO TABS
20.0000 mg | ORAL_TABLET | Freq: Two times a day (BID) | ORAL | Status: DC
  Administered 2018-11-04 – 2018-11-06 (×4): 20 mg via ORAL
  Filled 2018-11-04 (×5): qty 1

## 2018-11-04 NOTE — Consult Note (Signed)
Palliative care progress note  Reason for consult: Goals of care in light of advanced lung cancer  I met today with Jacqueline Leonard.  She reports that she is tired today.  I attempted to discuss with her regarding her clinical course this admission as well as goals of care moving forward.  She reports being too tired to talk this afternoon and asked me to call her niece, Jacqueline Leonard.    I called and was able to reach her niece.  Her niece tells me that Jacqueline Leonard is an independent woman who is been dealing with her cancer for very long time.  She lives alone but her ex-husband comes and checks on her frequently.  She also has other family who live out of town but they try to come and check on her whenever possible.  Her niece relays a story of continued decline in her nutrition and functional status over the last several months.  She reports that she has been seen by Jacqueline Leonard for a very long time and he has been working to add as much time in quality to her life over the last several years.  The family relies on him heavily to help guide their decision making.  Jacqueline Leonard reports that family has been concerned that her functional status has continued to decline so rapidly.  She states that during last encounter, Jacqueline Leonard discussed with her about disease progression and discussed options for continuation of Tagrisso, continue Tagrisso with addition of Cyramza, or focusing on supportive care with the assistance of hospice.  At that time, Jacqueline Leonard elected for Tagrisso with addition of Cyramza.  Her niece is concerned as she feels that her functional status has continued decline since this point in time.  She has not been eating much despite trial of steroids as well as Remeron.  It appears that Reglan was added today, and I agree with this as discussion appears to reveal component of early satiety.  -DNR/DNI -Jacqueline Leonard is reported to be overwhelmed and declined to talk with me today.  She did  asked me to call her niece to discuss further. -Her niece reports being concerned if she is benefiting from continued chemotherapy as her nutrition and functional status continue to decline over the past couple of weeks.  Concern that she lives alone and now also has fractured clavicle that will impair her even more.  We discussed the fact that Dr. Julien Nordmann knows her well and her niece is hopeful that he will be able to help guide them if she is likely to benefit from continued disease modifying therapy based upon her current clinical status.  Total time: 60 minutes Greater than 50%  of this time was spent counseling and coordinating care related to the above assessment and plan.  Micheline Rough, MD [place] Team [phone]

## 2018-11-04 NOTE — Progress Notes (Signed)
Daily Progress Note   Patient Name: Raeanna Soberanes       Date: 11/04/2018 DOB: 1946-03-23  Age: 72 y.o. MRN#: [id number] Attending Physician: Elmarie Shiley, MD Primary Care Physician: Rutherford Guys, MD Admit Date: 11/01/2018  Reason for Consultation/Follow-up: Establishing goals of care  Subjective: I saw Ms. Dupras who denied needs and indicated to call her niece.  I called and was able to reach niece and family remains concerned as to whether she is benefiting from continued chemotherapy.  I let them know that I sent Dr. Julien Nordmann a message to let him know that family is hoping he can help guide her likelihood of benefiting from additional chemotherapy based on her continued functional decline.  Length of Stay: 2  Current Medications: Scheduled Meds:  . famotidine  20 mg Oral BID  . feeding supplement (ENSURE ENLIVE)  237 mL Oral TID BM  . furosemide  20 mg Intravenous BID  . metoCLOPramide  5 mg Oral TID AC & HS  . mirtazapine  7.5 mg Oral QHS  . senna-docusate  1 tablet Oral BID    Continuous Infusions:   PRN Meds: acetaminophen **OR** acetaminophen, alum & mag hydroxide-simeth, HYDROcodone-homatropine, ondansetron **OR** ondansetron (ZOFRAN) IV, sodium chloride flush, sodium phosphate  Physical Exam         General: Alert, awake, in no acute distress. Chronically ill appearing  HEENT: No bruits, no goiter, no JVD Heart: Regular rate and rhythm. No murmur appreciated. Lungs: Good air movement, clear Abdomen: Soft, nontender, nondistended, positive bowel sounds.  Ext: No significant edema.  Arm in sling Skin: Warm and dry Neuro: Grossly intact, nonfocal.   Vital Signs: BP (!) 143/71 (BP Location: Left Arm)   Pulse (!) 101   Temp 98 F (36.7 C) (Oral)   Resp  18   Ht 5\' 1"  (1.549 m)   Wt 62.6 kg   SpO2 100%   BMI 26.07 kg/m  SpO2: SpO2: 100 % O2 Device: O2 Device: Nasal Cannula O2 Flow Rate: O2 Flow Rate (L/min): 2 L/min  Intake/output summary:   Intake/Output Summary (Last 24 hours) at 11/04/2018 2134 Last data filed at 11/04/2018 1418 Gross per 24 hour  Intake 300 ml  Output 200 ml  Net 100 ml   LBM: Last BM Date: 11/04/18(per NT report ) Baseline Weight: Weight:  62.6 kg Most recent weight: Weight: 62.6 kg       Palliative Assessment/Data:      Patient Active Problem List   Diagnosis Date Noted  . Weakness 11/02/2018  . Generalized weakness 11/01/2018  . Failure to thrive in adult 11/01/2018  . Traumatic closed nondisplaced fracture of acromial end of clavicle, left, initial encounter 11/01/2018  . Peripheral edema 11/01/2018  . Pleural effusion 10/08/2018  . Acute on chronic respiratory failure with hypoxia ([place]) 10/08/2018  . Dyspnea   . Goals of care, counseling/discussion 09/23/2018  . Pleural effusion, left 09/05/2018  . Malnutrition of moderate degree 09/05/2018  . Pelvic mass in female 09/02/2018  . Genetic testing 08/30/2018  . Family history of breast cancer   . Family history of lung cancer   . Lower urinary tract infectious disease 12/26/2016  . Acute cystitis without hematuria   . Hyponatremia   . Epistaxis 10/04/2016  . Adenocarcinoma of lung, stage 4, left ([place]) 06/13/2016  . Bone metastases ([place]) 04/11/2016  . Encounter for antineoplastic chemotherapy 02/14/2016  . Anemia in neoplastic disease 06/29/2015  . Pulmonary metastasis ([place]) 07/16/2014  . Vitamin B12 deficiency anemia 02/12/2014  . Secondary malignant neoplasm of bone and bone marrow ([place]) 06/29/2012  . Metastatic adenocarcinoma, pathology 06/19/12 06/27/2012  . Hypertension   . Hyperlipidemia   . Vitamin D deficiency     Palliative Care Assessment & Plan   Assessment: Patient Active Problem List   Diagnosis Date Noted  . Weakness  11/02/2018  . Generalized weakness 11/01/2018  . Failure to thrive in adult 11/01/2018  . Traumatic closed nondisplaced fracture of acromial end of clavicle, left, initial encounter 11/01/2018  . Peripheral edema 11/01/2018  . Pleural effusion 10/08/2018  . Acute on chronic respiratory failure with hypoxia ([place]) 10/08/2018  . Dyspnea   . Goals of care, counseling/discussion 09/23/2018  . Pleural effusion, left 09/05/2018  . Malnutrition of moderate degree 09/05/2018  . Pelvic mass in female 09/02/2018  . Genetic testing 08/30/2018  . Family history of breast cancer   . Family history of lung cancer   . Lower urinary tract infectious disease 12/26/2016  . Acute cystitis without hematuria   . Hyponatremia   . Epistaxis 10/04/2016  . Adenocarcinoma of lung, stage 4, left ([place]) 06/13/2016  . Bone metastases ([place]) 04/11/2016  . Encounter for antineoplastic chemotherapy 02/14/2016  . Anemia in neoplastic disease 06/29/2015  . Pulmonary metastasis ([place]) 07/16/2014  . Vitamin B12 deficiency anemia 02/12/2014  . Secondary malignant neoplasm of bone and bone marrow ([place]) 06/29/2012  . Metastatic adenocarcinoma, pathology 06/19/12 06/27/2012  . Hypertension   . Hyperlipidemia   . Vitamin D deficiency    Recommendations/Plan: -DNR/DNI -Ms. Nocito again asked me to call her niece to discuss further. -Discussed again with niece regarding concern if she is benefiting from continued chemotherapy as her nutrition and functional status have continued to decline over the past couple of weeks.  I sent Dr. Julien Nordmann a message at family request as they are hopeful that he will be able to help guide them if she is likely to benefit from continued disease modifying therapy based upon her current clinical status.  She has noted to family that she is not interested in continuing chemotherapy if it is only likely to add weeks to her life.  She is considering hospice support if she has reached a point where  further disease modifying therapy is unlikely to add significant time or quality to her life.  Code  Status:    Code Status Orders  (From admission, onward)         Start     Ordered   11/02/18 0005  Do not attempt resuscitation (DNR)  Continuous    Question Answer Comment  In the event of cardiac or respiratory ARREST Do not call a "code blue"   In the event of cardiac or respiratory ARREST Do not perform Intubation, CPR, defibrillation or ACLS   In the event of cardiac or respiratory ARREST Use medication by any route, position, wound care, and other measures to relive pain and suffering. May use oxygen, suction and manual treatment of airway obstruction as needed for comfort.      11/02/18 0006        Code Status History    Date Active Date Inactive Code Status Order ID Comments User Context   10/08/2018 0208 10/09/2018 1413 Full Code [id number]  Rise Patience, MD Inpatient   09/05/2018 0209 09/08/2018 1644 Full Code [id number]  Vianne Bulls, MD ED       Prognosis:   Guarded  Discharge Planning:  To Be Determined  Care plan was discussed with patient, niece, Dr. Tyrell Antonio  Thank you for allowing the Palliative Medicine Team to assist in the care of this patient.   Total Time 50 Prolonged Time Billed No      Greater than 50%  of this time was spent counseling and coordinating care related to the above assessment and plan.  Micheline Rough, MD  Please contact Palliative Medicine Team phone at [phone] for questions and concerns.

## 2018-11-04 NOTE — Progress Notes (Signed)
PROGRESS NOTE    Jacqueline Leonard  JJK:[id number] DOB: [birth year] DOA: 11/01/2018 PCP: Rutherford Guys, MD   Brief Narrative: Jacqueline Leonard is a 72 y.o. female with with metastatic adenocarcinoma of the lung on Tagrisso who was recently admitted last month for pleural effusion underwent thoracentesis had a fall 2 weeks ago but since then patient has been having left shoulder pain.  Denies losing consciousness.  Patient has been having progressive peripheral edema for which patient was started on Lasix by patient's oncologist 2 days ago.  Due to the left shoulder pain patient came to the ER.  Patient also has been having poor appetite with some nausea denies vomiting or diarrhea.  Noticed increasing abdominal girth peripheral edema. Patient presented with shortness of breath, progressive weakness.  Assessment & Plan:   Principal Problem:   Failure to thrive in adult Active Problems:   Metastatic adenocarcinoma, pathology 06/19/12   Lower urinary tract infectious disease   Pleural effusion, left   Malnutrition of moderate degree   Generalized weakness   Traumatic closed nondisplaced fracture of acromial end of clavicle, left, initial encounter   Peripheral edema   Weakness  1 Acute hypoxic respiratory failure; In the setting of pleural effusion, pulmonary edema. Continue with IV Lasix. Had  thoracentesis 12-07, 600 cc fluids removed.  Breathing better.   2-failure to thrive, poor appetite, progressive weakness. Palliative care consult Ensure. No benefit form medrol dose pack. Will stop it.   Abdominal distension;  Will check UA. If ascites  might need paracentesis.   Left clavicle fracture continue with sling.  Follow-up outpatient Pain Management  UTI: treated with  Ceftriaxone, finished 4 days.  UA: With more than 50 white blood cells.  Metastatic non-small cell lung cancer , with positive EGFR, on requiring thoracentesis previously. Follow with Dr. Julien Nordmann.  Anemia from  malignancy: Follow hemoglobin Hb stable.  Dark stool. Pepcid. Check for occult blood. Hb stable.   Nutrition;  Will start reglan to her with early satiety.   RN Pressure Injury Documentation:    Malnutrition Type:  Nutrition Problem: Moderate Malnutrition Etiology: chronic illness, cancer and cancer related treatments   Malnutrition Characteristics:  Signs/Symptoms: energy intake < or equal to 75% for > or equal to 1 month, mild fat depletion, moderate muscle depletion   Nutrition Interventions:  Interventions: Ensure Enlive (each supplement provides 350kcal and 20 grams of protein)  Estimated body mass index is 26.07 kg/m as calculated from the following:   Height as of this encounter: _0  (1.549 m).   Weight as of this encounter: 62.6 kg.   DVT prophylaxis: SCDs Code Status: DNR Family Communication: Care discussed with patient , no need for translator Disposition Plan: Remain the hospital for IV Lasix, IV antibiotics.  Poor oral intake.   Consultants:   Palliative care  Dr. Earlie Server will be added to the rounding team  Procedures:  Thoracentesis 11/02/2018     Antimicrobials:  Ceftriaxone   Subjective: She is still not able to eat.  Having Bowel movement.  Breathing better   Objective: Vitals:   11/03/18 1209 11/03/18 2038 11/04/18 0429 11/04/18 1454  BP: 137/63 (!) 143/61 137/68 (!) 148/59  Pulse: (!) 106 93 92 91  Resp: _1 Temp: 98 F (36.7 C) 97.7 F (36.5 C) 97.7 F (36.5 C) 97.7 F (36.5 C)  TempSrc: Oral Oral Oral Oral  SpO2: 99% 100% 100% 100%  Weight:      Height:  Intake/Output Summary (Last 24 hours) at 11/04/2018 1838 Last data filed at 11/04/2018 1418 Gross per 24 hour  Intake 300 ml  Output 200 ml  Net 100 ml   Filed Weights   11/01/18 1454  Weight: 62.6 kg    Examination:  General exam: chronic ill appearing.  Respiratory system; crackles bases.  Cardiovascular system: S 1, S 2  RRR Gastrointestinal system: BS present, soft, distended.  Central nervous system: alert and oriented Extremities: trace edema Skin: No rashes, lesions or ulcers   Data Reviewed: I have personally reviewed following labs and imaging studies  CBC: Recent Labs  Lab 11/01/18 1507 11/02/18 0622 11/03/18 0924 11/04/18 0411  WBC 10.2 6.6 15.1* 12.8*  HGB 10.0* 8.3* 9.4* 9.0*  HCT 33.7* 27.7* 31.5* 29.9*  MCV 96.6 96.5 96.9 96.1  PLT 128* 102* 121* 094*   Basic Metabolic Panel: Recent Labs  Lab 11/01/18 1507 11/02/18 0622 11/03/18 0924  NA 142 142 141  K 4.0 3.9 4.8  CL 106 108 107  CO2 _0 GLUCOSE 109* 82 128*  BUN 40* 38* 41*  CREATININE 0.85 0.77 0.94  CALCIUM 7.8* 7.2* 7.3*   GFR: Estimated Creatinine Clearance: 44.5 mL/min (by C-G formula based on SCr of 0.94 mg/dL). Liver Function Tests: Recent Labs  Lab 11/01/18 1507  AST 27  ALT 15  ALKPHOS 113  BILITOT 0.8  PROT 6.1*  ALBUMIN 2.7*   Recent Labs  Lab 11/01/18 1507  LIPASE 30   No results for input(s): AMMONIA in the last 168 hours. Coagulation Profile: No results for input(s): INR, PROTIME in the last 168 hours. Cardiac Enzymes: Recent Labs  Lab 11/01/18 1507  TROPONINI <0.03   BNP (last 3 results) No results for input(s): PROBNP in the last 8760 hours. HbA1C: No results for input(s): HGBA1C in the last 72 hours. CBG: Recent Labs  Lab 11/01/18 1631  GLUCAP 85   Lipid Profile: No results for input(s): CHOL, HDL, LDLCALC, TRIG, CHOLHDL, LDLDIRECT in the last 72 hours. Thyroid Function Tests: No results for input(s): TSH, T4TOTAL, FREET4, T3FREE, THYROIDAB in the last 72 hours. Anemia Panel: No results for input(s): VITAMINB12, FOLATE, FERRITIN, TIBC, IRON, RETICCTPCT in the last 72 hours. Sepsis Labs: No results for input(s): PROCALCITON, LATICACIDVEN in the last 168 hours.  No results found for this or any previous visit (from the past 240 hour(s)).       Radiology  Studies: US Abdomen Limited  Result Date: 11/04/2018 CLINICAL DATA:  History of metastatic lung malignancy. Evaluate volume of abdominal ascites. EXAM: LIMITED ABDOMEN ULTRASOUND FOR ASCITES TECHNIQUE: Limited ultrasound survey for ascites was performed in all four abdominal quadrants. COMPARISON:  Abdominal ultrasound FINDINGS: There is a moderate amount of ascites noted in all 4 quadrants of the abdomen. Portions of the liver and bowel are seen floating within it. IMPRESSION: Moderate volume ascites in all 4 quadrants. Electronically Signed   By: David  Martinique M.D.   On: 11/04/2018 14:57        Scheduled Meds: . famotidine  20 mg Oral BID  . feeding supplement (ENSURE ENLIVE)  237 mL Oral TID BM  . furosemide  20 mg Intravenous BID  . metoCLOPramide  5 mg Oral TID AC & HS  . mirtazapine  7.5 mg Oral QHS  . senna-docusate  1 tablet Oral BID   Continuous Infusions:    LOS: 2 days    Time spent: 35 minutes.     Elmarie Shiley, MD [facility]  Pager [phone]  If 7PM-7AM, please contact night-coverage [email or web address] Password [facility] 11/04/2018, 6:38 PM

## 2018-11-04 NOTE — Progress Notes (Signed)
   11/04/18 1500  Clinical Encounter Type  Visited With Patient and family together  Visit Type Initial  Referral From Physician  Consult/Referral To Chaplain;Other (Comment) (AD)  The chaplain followed up on Spiritual care consult for Pt. AD.  The Pt. niece-Tanya was present and interpreted for the Pt. with the chaplain. The Pt. is under the impression the case manager is handling the AD for the Pt.   The chaplain shared a brief AD explanation and left the educational document in the Pt. room for review while the chaplain checked to see if someone else had started the AD process. The chaplain did a note search and asked the CM if a AD had been started for the Pt. At this time the chaplain did not find any AD communication.  The chaplain will ask the spiritual care office to F/U on Tuesday.

## 2018-11-04 NOTE — Progress Notes (Signed)
PHARMACIST - PHYSICIAN COMMUNICATION   CONCERNING: IV to Oral Route Change Policy  RECOMMENDATION: This patient is receiving pepcid by the intravenous route.  Based on criteria approved by the Pharmacy and Therapeutics Committee, the intravenous medication(s) is/are being converted to the equivalent oral dose form(s).   DESCRIPTION: These criteria include:  The patient is eating (either orally or via tube) and/or has been taking other orally administered medications for a least 24 hours  The patient has no evidence of active gastrointestinal bleeding or impaired GI absorption (gastrectomy, short bowel, patient on TNA or NPO).  If you have questions about this conversion, please contact the Pharmacy Department  []   [phone] )  [facility] []   [phone] )  [facility] []   [phone] )  [facility] []   [phone] )  [facility] [x]   [phone] )  [facility]   Eudelia Bunch, [facility] 11/04/2018 7:19 AM

## 2018-11-04 NOTE — Telephone Encounter (Signed)
pls see med request. Ranitidine is not on med list. thanks

## 2018-11-04 NOTE — Plan of Care (Signed)
  Problem: Health Behavior/Discharge Planning: Goal: Ability to manage health-related needs will improve Outcome: Progressing   Problem: Elimination: Goal: Will not experience complications related to urinary retention Outcome: Progressing   Problem: Pain Managment: Goal: General experience of comfort will improve Outcome: Progressing   Problem: Safety: Goal: Ability to remain free from injury will improve Outcome: Progressing

## 2018-11-05 ENCOUNTER — Other Ambulatory Visit: Payer: Self-pay | Admitting: Medical Oncology

## 2018-11-05 ENCOUNTER — Inpatient Hospital Stay (HOSPITAL_COMMUNITY): Payer: Medicare Other

## 2018-11-05 LAB — OCCULT BLOOD X 1 CARD TO LAB, STOOL: Fecal Occult Bld: POSITIVE — AB

## 2018-11-05 LAB — GRAM STAIN

## 2018-11-05 LAB — CBC
HCT: 30.1 % — ABNORMAL LOW (ref 36.0–46.0)
Hemoglobin: 9 g/dL — ABNORMAL LOW (ref 12.0–15.0)
MCH: 28.8 pg (ref 26.0–34.0)
MCHC: 29.9 g/dL — ABNORMAL LOW (ref 30.0–36.0)
MCV: 96.5 fL (ref 80.0–100.0)
Platelets: 116 10*3/uL — ABNORMAL LOW (ref 150–400)
RBC: 3.12 MIL/uL — ABNORMAL LOW (ref 3.87–5.11)
RDW: 16.9 % — AB (ref 11.5–15.5)
WBC: 6.9 10*3/uL (ref 4.0–10.5)
nRBC: 0 % (ref 0.0–0.2)

## 2018-11-05 LAB — BASIC METABOLIC PANEL
Anion gap: 6 (ref 5–15)
BUN: 55 mg/dL — ABNORMAL HIGH (ref 8–23)
CO2: 30 mmol/L (ref 22–32)
Calcium: 6.9 mg/dL — ABNORMAL LOW (ref 8.9–10.3)
Chloride: 105 mmol/L (ref 98–111)
Creatinine, Ser: 0.88 mg/dL (ref 0.44–1.00)
GFR calc Af Amer: >60 mL/min (ref >60)
GFR calc non Af Amer: >60 mL/min (ref >60)
Glucose, Bld: 103 mg/dL — ABNORMAL HIGH (ref 70–99)
Potassium: 4 mmol/L (ref 3.5–5.1)
SODIUM: 141 mmol/L (ref 135–145)

## 2018-11-05 MED ORDER — LIDOCAINE HCL 1 % IJ SOLN
INTRAMUSCULAR | Status: AC
  Filled 2018-11-05: qty 20

## 2018-11-05 NOTE — Progress Notes (Signed)
PROGRESS NOTE    Jacqueline Leonard  FWY:[id number] DOB: September 22, 1946 DOA: 11/01/2018 PCP: Rutherford Guys, MD   Brief Narrative: Forrest Demuro is a 72 y.o. female with with metastatic adenocarcinoma of the lung on Tagrisso who was recently admitted last month for pleural effusion underwent thoracentesis had a fall 2 weeks ago but since then patient has been having left shoulder pain.  Denies losing consciousness.  Patient has been having progressive peripheral edema for which patient was started on Lasix by patient's oncologist 2 days ago.  Due to the left shoulder pain patient came to the ER.  Patient also has been having poor appetite with some nausea denies vomiting or diarrhea.  Noticed increasing abdominal girth peripheral edema. Patient presented with shortness of breath, progressive weakness. FTT.   Assessment & Plan:   Principal Problem:   Failure to thrive in adult Active Problems:   Metastatic adenocarcinoma, pathology 06/19/12   Lower urinary tract infectious disease   Pleural effusion, left   Malnutrition of moderate degree   Generalized weakness   Traumatic closed nondisplaced fracture of acromial end of clavicle, left, initial encounter   Peripheral edema   Weakness  1 Acute hypoxic respiratory failure; In the setting of pleural effusion, pulmonary edema. Continue with IV Lasix. Had  thoracentesis 12-07, 600 cc fluids removed.  Dyspnea improved.  Needs evaluation for home oxygen.   2-failure to thrive, poor appetite, progressive weakness. Palliative care consult Ensure. No benefit form medrol dose pack. Will stop it.   Abdominal distension; Ascites;  Underwent paracentesis.  Follow cytology.  Follow culture.  Check LFT   Left clavicle fracture continue with sling.  Follow-up outpatient Pain Management  UTI: treated with  Ceftriaxone, finished 4 days.  UA: With more than 50 white blood cells.  Metastatic non-small cell lung cancer , with positive EGFR, on  requiring thoracentesis previously. Follow with Dr. Julien Nordmann. Dr Julien Nordmann will be back to office 10--11. Hopefully he will follow up on patient. For goals of care.   Anemia from malignancy: Follow hemoglobin Hb stable.  Dark stool. Pepcid. Check for occult blood. Hb stable.  Hb stable.   Moderate malnutrition; in setting chronic illness.  Will start reglan to her with early satiety.   RN Pressure Injury Documentation:    Malnutrition Type:  Nutrition Problem: Moderate Malnutrition Etiology: chronic illness, cancer and cancer related treatments   Malnutrition Characteristics:  Signs/Symptoms: energy intake < or equal to 75% for > or equal to 1 month, mild fat depletion, moderate muscle depletion   Nutrition Interventions:  Interventions: Ensure Enlive (each supplement provides 350kcal and 20 grams of protein)  Estimated body mass index is 26.07 kg/m as calculated from the following:   Height as of this encounter: 5' 1"  (1.549 m).   Weight as of this encounter: 62.6 kg.   DVT prophylaxis: SCDs Code Status: DNR Family Communication: Care discussed with patient , no need for translator Disposition Plan: Remain the hospital for IV Lasix, IV antibiotics.  Poor oral intake.   Consultants:   Palliative care  Dr. Earlie Server will be added to the rounding team  Procedures:  Thoracentesis 11/02/2018     Antimicrobials:  Ceftriaxone   Subjective: Breathing better.  Feels better after paracentesis. Not eating well.    Objective: Vitals:   11/05/18 1008 11/05/18 1012 11/05/18 1019 11/05/18 1257  BP: (!) 130/59 (!) 131/55 (!) 127/53 (!) 142/57  Pulse:    96  Resp:    19  Temp:    (!)  97.5 F (36.4 C)  TempSrc:    Oral  SpO2:    99%  Weight:      Height:        Intake/Output Summary (Last 24 hours) at 11/05/2018 1558 Last data filed at 11/05/2018 1258 Gross per 24 hour  Intake 300 ml  Output -  Net 300 ml   Filed Weights   11/01/18 1454  Weight: 62.6  kg    Examination:  General exam: thin appearing.  Respiratory system; Crackles bases.  Cardiovascular system: S 1, S 2 RRR Gastrointestinal system: BS present, soft, nt Central nervous system: alert and oriented.  Extremities: trace edema Skin: No rashes, lesions or ulcers   Data Reviewed: I have personally reviewed following labs and imaging studies  CBC: Recent Labs  Lab 11/01/18 1507 11/02/18 0622 11/03/18 0924 11/04/18 0411 11/05/18 0929  WBC 10.2 6.6 15.1* 12.8* 6.9  HGB 10.0* 8.3* 9.4* 9.0* 9.0*  HCT 33.7* 27.7* 31.5* 29.9* 30.1*  MCV 96.6 96.5 96.9 96.1 96.5  PLT 128* 102* 121* 112* 081*   Basic Metabolic Panel: Recent Labs  Lab 11/01/18 1507 11/02/18 0622 11/03/18 0924 11/05/18 1229  NA 142 142 141 141  K 4.0 3.9 4.8 4.0  CL 106 108 107 105  CO2 26 25 26 30   GLUCOSE 109* 82 128* 103*  BUN 40* 38* 41* 55*  CREATININE 0.85 0.77 0.94 0.88  CALCIUM 7.8* 7.2* 7.3* 6.9*   GFR: Estimated Creatinine Clearance: 47.5 mL/min (by C-G formula based on SCr of 0.88 mg/dL). Liver Function Tests: Recent Labs  Lab 11/01/18 1507  AST 27  ALT 15  ALKPHOS 113  BILITOT 0.8  PROT 6.1*  ALBUMIN 2.7*   Recent Labs  Lab 11/01/18 1507  LIPASE 30   No results for input(s): AMMONIA in the last 168 hours. Coagulation Profile: No results for input(s): INR, PROTIME in the last 168 hours. Cardiac Enzymes: Recent Labs  Lab 11/01/18 1507  TROPONINI <0.03   BNP (last 3 results) No results for input(s): PROBNP in the last 8760 hours. HbA1C: No results for input(s): HGBA1C in the last 72 hours. CBG: Recent Labs  Lab 11/01/18 1631  GLUCAP 85   Lipid Profile: No results for input(s): CHOL, HDL, LDLCALC, TRIG, CHOLHDL, LDLDIRECT in the last 72 hours. Thyroid Function Tests: No results for input(s): TSH, T4TOTAL, FREET4, T3FREE, THYROIDAB in the last 72 hours. Anemia Panel: No results for input(s): VITAMINB12, FOLATE, FERRITIN, TIBC, IRON, RETICCTPCT in the last  72 hours. Sepsis Labs: No results for input(s): PROCALCITON, LATICACIDVEN in the last 168 hours.  Recent Results (from the past 240 hour(s))  Gram stain     Status: None   Collection Time: 11/05/18 10:37 AM  Result Value Ref Range Status   Specimen Description PERITONEAL  Final   Special Requests NONE  Final   Gram Stain   Final    MODERATE WBC PRESENT, PREDOMINANTLY MONONUCLEAR NO ORGANISMS SEEN Performed at [facility], [address] [place], [place] [address]    Report Status 11/05/2018 FINAL  Final         Radiology Studies: US Abdomen Limited  Result Date: 11/04/2018 CLINICAL DATA:  History of metastatic lung malignancy. Evaluate volume of abdominal ascites. EXAM: LIMITED ABDOMEN ULTRASOUND FOR ASCITES TECHNIQUE: Limited ultrasound survey for ascites was performed in all four abdominal quadrants. COMPARISON:  Abdominal ultrasound FINDINGS: There is a moderate amount of ascites noted in all 4 quadrants of the abdomen. Portions of the liver and bowel  are seen floating within it. IMPRESSION: Moderate volume ascites in all 4 quadrants. Electronically Signed   By: David  Martinique M.D.   On: 11/04/2018 14:57   US Paracentesis  Result Date: 11/05/2018 INDICATION: Patient with history of metastatic lung cancer, ascites. Request made for diagnostic and therapeutic paracentesis. EXAM: ULTRASOUND GUIDED DIAGNOSTIC AND THERAPEUTIC PARACENTESIS MEDICATIONS: None COMPLICATIONS: None immediate. PROCEDURE: Informed written consent was obtained from the patient after a discussion of the risks, benefits and alternatives to treatment. A timeout was performed prior to the initiation of the procedure. Initial ultrasound scanning demonstrates a moderate amount of ascites within the left lower abdominal quadrant. The left lower abdomen was prepped and draped in the usual sterile fashion. 1% lidocaine was used for local anesthesia. Following this, a 19 gauge, 7-cm, Yueh catheter was introduced. An  ultrasound image was saved for documentation purposes. The paracentesis was performed. The catheter was removed and a dressing was applied. The patient tolerated the procedure well without immediate post procedural complication. FINDINGS: A total of approximately 3.3 liters of slightly hazy, yellow fluid was removed. Samples were sent to the laboratory as requested by the clinical team. IMPRESSION: Successful ultrasound-guided diagnostic and therapeutic paracentesis yielding 3.3 liters of peritoneal fluid. Read by: Rowe Robert, PA-C Electronically Signed   By: Markus Daft M.D.   On: 11/05/2018 10:33        Scheduled Meds: . famotidine  20 mg Oral BID  . feeding supplement (ENSURE ENLIVE)  237 mL Oral TID BM  . furosemide  20 mg Intravenous BID  . lidocaine      . metoCLOPramide  5 mg Oral TID AC & HS  . mirtazapine  7.5 mg Oral QHS  . senna-docusate  1 tablet Oral BID   Continuous Infusions:    LOS: 3 days    Time spent: 35 minutes.     Elmarie Shiley, MD [facility] Pager [phone]  If 7PM-7AM, please contact night-coverage [email or web address] Password [facility] 11/05/2018, 3:58 PM

## 2018-11-05 NOTE — Care Management Important Message (Signed)
Important Message  Patient Details  Name: Jacqueline Leonard MRN: [id number] Date of Birth: [birth year]   Medicare Important Message Given:  Yes    Kerin Salen 11/05/2018, 12:28 [place] Message  Patient Details  Name: Jacqueline Leonard MRN: [id number] Date of Birth: 17-May-1946   Medicare Important Message Given:  Yes    Kerin Salen 11/05/2018, 12:28 PM

## 2018-11-05 NOTE — Procedures (Addendum)
Ultrasound-guided diagnostic and therapeutic paracentesis performed yielding 3.3 liters of slightly hazy, yellow fluid. No immediate complications.  A portion of the fluid was submitted to the lab for preordered studies. EBL< 2cc.

## 2018-11-05 NOTE — Progress Notes (Signed)
   11/05/18 0900  Clinical Encounter Type  Visited With Patient  Visit Type Initial;Psychological support;Spiritual support  Referral From Physician  Consult/Referral To Chaplain  Spiritual Encounters  Spiritual Needs Brochure;Emotional;Other (Comment) (Spiritual Care Conversation/Support)  Stress Factors  Patient Stress Factors None identified;Other (Comment) (Advance Directive after her procedure)   I visited with the patient prior to a procedure. She wants to discuss an Advance Directive once she is finished.   Please, contact Spiritual Care for further assistance.   Chaplain Shanon Ace M.Div., [facility]

## 2018-11-06 ENCOUNTER — Telehealth: Payer: Self-pay | Admitting: *Deleted

## 2018-11-06 DIAGNOSIS — C799 Secondary malignant neoplasm of unspecified site: Secondary | ICD-10-CM

## 2018-11-06 DIAGNOSIS — R180 Malignant ascites: Secondary | ICD-10-CM

## 2018-11-06 LAB — BASIC METABOLIC PANEL
Anion gap: 5 (ref 5–15)
BUN: 52 mg/dL — ABNORMAL HIGH (ref 8–23)
CHLORIDE: 108 mmol/L (ref 98–111)
CO2: 31 mmol/L (ref 22–32)
Calcium: 7.2 mg/dL — ABNORMAL LOW (ref 8.9–10.3)
Creatinine, Ser: 0.8 mg/dL (ref 0.44–1.00)
GFR calc non Af Amer: >60 mL/min (ref >60)
Glucose, Bld: 113 mg/dL — ABNORMAL HIGH (ref 70–99)
Potassium: 4 mmol/L (ref 3.5–5.1)
Sodium: 144 mmol/L (ref 135–145)

## 2018-11-06 LAB — CBC
HCT: 27.3 % — ABNORMAL LOW (ref 36.0–46.0)
HEMOGLOBIN: 8 g/dL — AB (ref 12.0–15.0)
MCH: 28.5 pg (ref 26.0–34.0)
MCHC: 29.3 g/dL — ABNORMAL LOW (ref 30.0–36.0)
MCV: 97.2 fL (ref 80.0–100.0)
Platelets: 99 10*3/uL — ABNORMAL LOW (ref 150–400)
RBC: 2.81 MIL/uL — ABNORMAL LOW (ref 3.87–5.11)
RDW: 16.6 % — ABNORMAL HIGH (ref 11.5–15.5)
WBC: 5.1 10*3/uL (ref 4.0–10.5)
nRBC: 0 % (ref 0.0–0.2)

## 2018-11-06 LAB — HEPATIC FUNCTION PANEL
ALT: 13 U/L (ref 0–44)
AST: 26 U/L (ref 15–41)
Albumin: 1.9 g/dL — ABNORMAL LOW (ref 3.5–5.0)
Alkaline Phosphatase: 94 U/L (ref 38–126)
Bilirubin, Direct: 0.1 mg/dL (ref 0.0–0.2)
Indirect Bilirubin: 0.3 mg/dL (ref 0.3–0.9)
Total Bilirubin: 0.4 mg/dL (ref 0.3–1.2)
Total Protein: 4.7 g/dL — ABNORMAL LOW (ref 6.5–8.1)

## 2018-11-06 MED ORDER — MORPHINE SULFATE (CONCENTRATE) 10 MG/0.5ML PO SOLN
10.0000 mg | ORAL | Status: DC | PRN
  Administered 2018-11-06 (×2): 10 mg via SUBLINGUAL
  Filled 2018-11-06 (×4): qty 0.5

## 2018-11-06 NOTE — Progress Notes (Signed)
Chaplain provided assistance in completing advance directive.   Communicated with pt via tele-medical interpreter.   Pt expresses understanding of advance directive.  Is alert and oriented x4.  Chaplain notarized document with witnesses.  Copy placed in pt chart.    Pt with original and copies.     Jerene Pitch, MDiv, [facility]

## 2018-11-06 NOTE — Telephone Encounter (Signed)
Faxed form to [employer]. Tagrisso discontinued secondary to disease progression

## 2018-11-06 NOTE — Progress Notes (Signed)
PMT no charge note  Call received from Niece [name] requesting update from Dr Earlie Server. I discussed with her that we are still awaiting input and final recommendations from med onc.   Meanwhile, we did discuss, over the phone, about hospice philosophy of care. The difference between residential hospice and home with hospice was discussed.   The patient lives at home by herself, here in [place], [place]. Ms [name] is interested in finding out more about residential hospice towards the end of this hospitalization, if that is what is deemed most appropriate by med onc.   Ms [name] is requesting for updates from med onc directly so that she may be able to plan next steps.   I was also given the patient's other niece Tania's number, so that she may also be given any medical information or updates pertaining to the patient, in addition to Ms [name]. Tania's number is [phone].   Loistine Chance MD [facility] health palliative medicine team [phone]

## 2018-11-06 NOTE — Progress Notes (Signed)
PROGRESS NOTE    Jacqueline Leonard  JZP:[id number] DOB: July 25, 1946 DOA: 11/01/2018 PCP: Rutherford Guys, MD   Brief Narrative: Jacqueline Leonard is a 72 y.o. female with with metastatic adenocarcinoma of the lung on Tagrisso who was recently admitted last month for pleural effusion underwent thoracentesis had a fall 2 weeks ago but since then patient has been having left shoulder pain.  Denies losing consciousness.  Patient has been having progressive peripheral edema for which patient was started on Lasix by patient's oncologist 2 days ago.  Due to the left shoulder pain patient came to the ER.  Patient also has been having poor appetite with some nausea denies vomiting or diarrhea.  Noticed increasing abdominal girth peripheral edema. Patient presented with shortness of breath, progressive weakness. FTT.   Assessment & Plan:   Principal Problem:   Failure to thrive in adult Active Problems:   Metastatic adenocarcinoma, pathology 06/19/12   Lower urinary tract infectious disease   Pleural effusion, left   Malnutrition of moderate degree   Generalized weakness   Traumatic closed nondisplaced fracture of acromial end of clavicle, left, initial encounter   Peripheral edema   Weakness  1 Acute hypoxic respiratory failure; In the setting of pleural effusion, pulmonary edema. Continue with IV Lasix. Had  thoracentesis 12-07, 600 cc fluids removed.  Dyspnea improved.  Needs evaluation for home oxygen.  Also likely due to abdominal distention from ascites, now status post paracentesis.  2-failure to thrive, poor appetite, progressive weakness. Palliative care consultation 12/9 appreciated. PMT have communicated with patient's primary oncologist to get his input to guide patient and family if she will benefit from any further treatment for her cancer.  She expressed to family that she is not interested in continuing chemotherapy if it is only likely to add weeks to her life and she is considering  hospice support.  Awaiting Dr. Worthy Flank input. Ensure. No benefit form medrol dose pack. Will stop it.   Abdominal distension; Ascites;  Underwent paracentesis, diagnostic and therapeutic on 12/10 yielding 3.3 L of fluid..  Ascitic fluid culture negative to date.  Follow cytology results. Added Roxanol for abdominal pain.  Left clavicle fracture continue with sling.  Follow-up outpatient Pain Management  UTI: treated with  Ceftriaxone, finished 4 days.  UA: With more than 50 white blood cells.  Metastatic non-small cell lung cancer , with positive EGFR, on requiring thoracentesis previously. Follow with Dr. Julien Nordmann. Awaiting primary oncologist input regarding further course of action.  Anemia from malignancy: Follow hemoglobin Hemoglobin is dropped from 9 to 8 g.  FOBT +.  Dark stool noted but patient denies.  Follow CBC in a.m.  Continue Pepcid.  Thrombocytopenia: Relatively stable.  Probably related to chemo meds.  Moderate malnutrition; in setting chronic illness.  Will start reglan to her with early satiety.   RN Pressure Injury Documentation:    Malnutrition Type: Moderate malnutrition.  Management per RD input.  Nutrition Problem: Moderate Malnutrition Etiology: chronic illness, cancer and cancer related treatments    DVT prophylaxis: SCDs Code Status: DNR Family Communication: Care discussed with patient , no need for translator Disposition Plan: To be determined pending oncology input and palliative team follow-up.   Consultants:   Palliative care  Dr. Earlie Server will be added to the rounding team.  I discussed in detail with Dr. Julien Nordmann who will try to see patient 12/12.  Procedures:  Thoracentesis 11/02/2018 Paracentesis 11/06/2018.     Antimicrobials:  Ceftriaxone   Subjective: Overall feels poorly.  Ongoing  chronic dyspnea, some abdominal distention and discomfort.  Back pain and abdominal pain not controlled with  Tylenol.   Objective: Vitals:   11/05/18 1257 11/05/18 2014 11/06/18 0338 11/06/18 1250  BP: (!) 142/57 110/63 (!) 131/51 (!) 129/54  Pulse: 96 97 98 96  Resp: _0 Temp: (!) 97.5 F (36.4 C) 98 F (36.7 C) 98 F (36.7 C) 98 F (36.7 C)  TempSrc: Oral Oral Oral Oral  SpO2: 99% 99% 100% 100%  Weight:      Height:        Intake/Output Summary (Last 24 hours) at 11/06/2018 1718 Last data filed at 11/06/2018 1226 Gross per 24 hour  Intake 240 ml  Output 400 ml  Net -160 ml   Filed Weights   11/01/18 1454  Weight: 62.6 kg    Examination:  General exam: Elderly female, small built, chronically ill looking sitting up in chair. Respiratory system; diminished breath sounds in the bases but otherwise clear to auscultation.  No increased work of breathing.  Has right Port-A-Cath. Cardiovascular system: S 1, S 2 RRR.  No JVD.  Trace bilateral ankle edema.  Telemetry personally reviewed: Mostly sinus rhythm.  Occasional mild sinus tachycardia. Gastrointestinal system: Abdomen mildly distended but soft and nontender.  Normal bowel sounds heard. Central nervous system: alert and oriented.  Extremities: trace edema Skin: No rashes, lesions or ulcers   Data Reviewed: I have personally reviewed following labs and imaging studies  CBC: Recent Labs  Lab 11/02/18 0622 11/03/18 0924 11/04/18 0411 11/05/18 0929 11/06/18 0502  WBC 6.6 15.1* 12.8* 6.9 5.1  HGB 8.3* 9.4* 9.0* 9.0* 8.0*  HCT 27.7* 31.5* 29.9* 30.1* 27.3*  MCV 96.5 96.9 96.1 96.5 97.2  PLT 102* 121* 112* 116* 99*   Basic Metabolic Panel: Recent Labs  Lab 11/01/18 1507 11/02/18 0622 11/03/18 0924 11/05/18 1229 11/06/18 0502  NA 142 142 141 141 144  K 4.0 3.9 4.8 4.0 4.0  CL 106 108 107 105 108  CO2 _1 GLUCOSE 109* 82 128* 103* 113*  BUN 40* 38* 41* 55* 52*  CREATININE 0.85 0.77 0.94 0.88 0.80  CALCIUM 7.8* 7.2* 7.3* 6.9* 7.2*   GFR: Estimated Creatinine Clearance: 52.3 mL/min (by  C-G formula based on SCr of 0.8 mg/dL). Liver Function Tests: Recent Labs  Lab 11/01/18 1507 11/06/18 0502  AST 27 26  ALT 15 13  ALKPHOS 113 94  BILITOT 0.8 0.4  PROT 6.1* 4.7*  ALBUMIN 2.7* 1.9*   Recent Labs  Lab 11/01/18 1507  LIPASE 30   Cardiac Enzymes: Recent Labs  Lab 11/01/18 1507  TROPONINI <0.03   CBG: Recent Labs  Lab 11/01/18 1631  GLUCAP 85     Recent Results (from the past 240 hour(s))  Culture, body fluid-bottle     Status: None (Preliminary result)   Collection Time: 11/05/18 10:37 AM  Result Value Ref Range Status   Specimen Description PERITONEAL  Final   Special Requests NONE  Final   Culture   Final    NO GROWTH 1 DAY Performed at [facility], [place] [address] [place], [place] [address]    Report Status PENDING  Incomplete  Gram stain     Status: None   Collection Time: 11/05/18 10:37 AM  Result Value Ref Range Status   Specimen Description PERITONEAL  Final   Special Requests NONE  Final   Gram Stain   Final    MODERATE WBC PRESENT,  PREDOMINANTLY MONONUCLEAR NO ORGANISMS SEEN Performed at [facility], [place] [address], [place], [place] [address]    Report Status 11/05/2018 FINAL  Final         Radiology Studies: US Paracentesis  Result Date: 11/05/2018 INDICATION: Patient with history of metastatic lung cancer, ascites. Request made for diagnostic and therapeutic paracentesis. EXAM: ULTRASOUND GUIDED DIAGNOSTIC AND THERAPEUTIC PARACENTESIS MEDICATIONS: None COMPLICATIONS: None immediate. PROCEDURE: Informed written consent was obtained from the patient after a discussion of the risks, benefits and alternatives to treatment. A timeout was performed prior to the initiation of the procedure. Initial ultrasound scanning demonstrates a moderate amount of ascites within the left lower abdominal quadrant. The left lower abdomen was prepped and draped in the usual sterile fashion. 1% lidocaine was used for local anesthesia.  Following this, a 19 gauge, 7-cm, Yueh catheter was introduced. An ultrasound image was saved for documentation purposes. The paracentesis was performed. The catheter was removed and a dressing was applied. The patient tolerated the procedure well without immediate post procedural complication. FINDINGS: A total of approximately 3.3 liters of slightly hazy, yellow fluid was removed. Samples were sent to the laboratory as requested by the clinical team. IMPRESSION: Successful ultrasound-guided diagnostic and therapeutic paracentesis yielding 3.3 liters of peritoneal fluid. Read by: Rowe Robert, PA-C Electronically Signed   By: Markus Daft M.D.   On: 11/05/2018 10:33        Scheduled Meds: . famotidine  20 mg Oral BID  . feeding supplement (ENSURE ENLIVE)  237 mL Oral TID BM  . furosemide  20 mg Intravenous BID  . metoCLOPramide  5 mg Oral TID AC & HS  . mirtazapine  7.5 mg Oral QHS  . senna-docusate  1 tablet Oral BID   Continuous Infusions:    LOS: 4 days    Vernell Leep, MD, FACP, [facility]. [facility] Pager [phone]  If 7PM-7AM, please contact night-coverage [email or web address] Password [facility] 11/06/2018, 5:31 PM

## 2018-11-07 DIAGNOSIS — Z515 Encounter for palliative care: Secondary | ICD-10-CM

## 2018-11-07 DIAGNOSIS — Z6826 Body mass index (BMI) 26.0-26.9, adult: Secondary | ICD-10-CM

## 2018-11-07 DIAGNOSIS — L8990 Pressure ulcer of unspecified site, unspecified stage: Secondary | ICD-10-CM

## 2018-11-07 DIAGNOSIS — R627 Adult failure to thrive: Principal | ICD-10-CM

## 2018-11-07 DIAGNOSIS — C3490 Malignant neoplasm of unspecified part of unspecified bronchus or lung: Secondary | ICD-10-CM

## 2018-11-07 LAB — CBC
HEMATOCRIT: 29.9 % — AB (ref 36.0–46.0)
Hemoglobin: 8.8 g/dL — ABNORMAL LOW (ref 12.0–15.0)
MCH: 28 pg (ref 26.0–34.0)
MCHC: 29.4 g/dL — ABNORMAL LOW (ref 30.0–36.0)
MCV: 95.2 fL (ref 80.0–100.0)
Platelets: 91 10*3/uL — ABNORMAL LOW (ref 150–400)
RBC: 3.14 MIL/uL — ABNORMAL LOW (ref 3.87–5.11)
RDW: 16.2 % — AB (ref 11.5–15.5)
WBC: 5.7 10*3/uL (ref 4.0–10.5)
nRBC: 0 % (ref 0.0–0.2)

## 2018-11-07 LAB — BASIC METABOLIC PANEL
Anion gap: 5 (ref 5–15)
BUN: 49 mg/dL — ABNORMAL HIGH (ref 8–23)
CHLORIDE: 107 mmol/L (ref 98–111)
CO2: 30 mmol/L (ref 22–32)
Calcium: 7.7 mg/dL — ABNORMAL LOW (ref 8.9–10.3)
Creatinine, Ser: 0.77 mg/dL (ref 0.44–1.00)
GFR calc Af Amer: >60 mL/min (ref >60)
GFR calc non Af Amer: >60 mL/min (ref >60)
Glucose, Bld: 91 mg/dL (ref 70–99)
Potassium: 4.4 mmol/L (ref 3.5–5.1)
Sodium: 142 mmol/L (ref 135–145)

## 2018-11-07 MED ORDER — MORPHINE SULFATE (PF) 2 MG/ML IV SOLN
1.0000 mg | Freq: Four times a day (QID) | INTRAVENOUS | Status: DC
  Administered 2018-11-07 – 2018-11-09 (×8): 1 mg via INTRAVENOUS
  Filled 2018-11-07 (×8): qty 1

## 2018-11-07 MED ORDER — MORPHINE SULFATE (PF) 2 MG/ML IV SOLN
1.0000 mg | INTRAVENOUS | Status: DC | PRN
  Administered 2018-11-07 – 2018-11-09 (×5): 1 mg via INTRAVENOUS
  Filled 2018-11-07 (×5): qty 1

## 2018-11-07 MED ORDER — MORPHINE SULFATE (PF) 2 MG/ML IV SOLN
1.0000 mg | Freq: Four times a day (QID) | INTRAVENOUS | Status: DC
  Filled 2018-11-07: qty 1

## 2018-11-07 NOTE — Progress Notes (Addendum)
Chaplain paged by nursing for support with family at bedside.   Provided support around end of life, grief, prayers with family and patient.    Jacqueline Leonard is surrounded by close family and friends.  Their concern is that she is comfortable and peaceful during this time.     Chaplains will follow throughout shift to provide follow up support with [name] and family.  Please page as needs arise.    Jerene Pitch, MDiv, BCC  24 hour oncall pager  [phone]

## 2018-11-07 NOTE — Progress Notes (Signed)
PROGRESS NOTE    Jacqueline Leonard  DDU:[id number] DOB: [birth year] DOA: 11/01/2018 PCP: Rutherford Guys, MD   Brief Narrative: Jacqueline Leonard is a 73 y.o. female with with metastatic adenocarcinoma of the lung on Tagrisso who was recently admitted last month for pleural effusion underwent thoracentesis had a fall 2 weeks ago but since then patient has been having left shoulder pain.  Denies losing consciousness.  Patient has been having progressive peripheral edema for which patient was started on Lasix by patient's oncologist 2 days ago.  Due to the left shoulder pain patient came to the ER.  Patient also has been having poor appetite with some nausea denies vomiting or diarrhea.  Noticed increasing abdominal girth peripheral edema. Patient presented with shortness of breath, progressive weakness. FTT.  Condition rapidly declined on 12/12 and with assistance of PMT, she was transitioned to full comfort care and possible residential hospice discharge versus hospital death.  Assessment & Plan:   Principal Problem:   Failure to thrive in adult Active Problems:   Metastatic adenocarcinoma, pathology 06/19/12   Lower urinary tract infectious disease   Pleural effusion, left   Malnutrition of moderate degree   Generalized weakness   Traumatic closed nondisplaced fracture of acromial end of clavicle, left, initial encounter   Peripheral edema   Weakness   Pressure injury of skin   Palliative care by specialist   Dying care  1 Acute hypoxic respiratory failure; In the setting of pleural effusion, pulmonary edema. Continue with IV Lasix. Had  thoracentesis 12-07, 600 cc fluids removed.  Dyspnea improved.  Also likely due to abdominal distention from ascites, now status post paracentesis. Patient rapidly declined on 12/12 and was transitioned to full comfort care.  2-failure to thrive, poor appetite, progressive weakness. Palliative care consultation 12/9 appreciated. PMT have communicated  with patient's primary oncologist to get his input to guide patient and family if she will benefit from any further treatment for her cancer.  She expressed to family that she is not interested in continuing chemotherapy if it is only likely to add weeks to her life and she is considering hospice support.  I discussed with Dr. Julien Nordmann on 12/11 and he was planning to come by and meet with patient and family.  Due to rapid decline in her clinical condition, PMT saw her today and based on her wishes and discussion with family, she was transitioned to full comfort care on 12/12.   Abdominal distension; Ascites;  Underwent paracentesis, diagnostic and therapeutic on 12/10 yielding 3.3 L of fluid..  Ascitic fluid culture negative to date.  Follow cytology results. Added Roxanol for abdominal pain.  Left clavicle fracture continue with sling.  Follow-up outpatient Pain Management  UTI: treated with  Ceftriaxone, finished 4 days.  UA: With more than 50 white blood cells.  Metastatic non-small cell lung cancer , with positive EGFR, on requiring thoracentesis previously. Follow with Dr. Julien Nordmann.  Anemia from malignancy: Follow hemoglobin Hemoglobin is dropped from 9 to 8 g.  FOBT +.  Dark stool noted but patient denies.  Hemoglobin stable.  Thrombocytopenia: Relatively stable.  Probably related to chemo meds.  Moderate malnutrition; in setting chronic illness.    RN Pressure Injury Documentation: Pressure Injury 11/06/18 Stage II -  Partial thickness loss of dermis presenting as a shallow open ulcer with a red, pink wound bed without slough. (Active)  11/06/18 2130  Location: Sacrum  Location Orientation: Medial  Staging: Stage II -  Partial thickness loss of dermis presenting  as a shallow open ulcer with a red, pink wound bed without slough.  Wound Description (Comments):   Present on Admission:     Malnutrition Type: Moderate malnutrition.  Management per RD input.  Nutrition Problem:  Moderate Malnutrition Etiology: chronic illness, cancer and cancer related treatments    DVT prophylaxis: SCDs Code Status: DNR Family Communication: Discussed in detail with patient in the presence of her niece and best friend at bedside this morning. Disposition Plan: DC to residential hospice versus hospital death.   Consultants:   Palliative care  Dr. Earlie Server will be added to the rounding team.  I discussed in detail with Dr. Julien Nordmann who will try to see patient 12/12.  Procedures:  Thoracentesis 11/02/2018 Paracentesis 11/06/2018.     Antimicrobials:  Ceftriaxone   Subjective: Patient seen this morning.  Family at bedside.  Reports that her pain and dyspnea and anxiety are much better after Roxanol.  PMT had met with her earlier in the morning.   Objective: Vitals:   11/06/18 1250 11/06/18 2032 11/07/18 0437 11/07/18 0439  BP: (!) 129/54 (!) 117/59 131/61   Pulse: 96 96 99   Resp: 18 20 (!) 8 (!) 9  Temp: 98 F (36.7 C) 97.8 F (36.6 C)    TempSrc: Oral Oral    SpO2: 100% 100% 100%   Weight:      Height:        Intake/Output Summary (Last 24 hours) at 11/07/2018 1714 Last data filed at 11/07/2018 0600 Gross per 24 hour  Intake 60 ml  Output -  Net 60 ml   Filed Weights   11/01/18 1454  Weight: 62.6 kg    Examination:  General exam: Elderly female, small built, chronically ill looking lying comfortably supine in bed without distress. Respiratory system; diminished breath sounds in the bases but otherwise clear to auscultation.  No increased work of breathing.  Has right Port-A-Cath.  No change/stable. Cardiovascular system: S 1, S 2 RRR.  No JVD.  Trace bilateral ankle edema.  Gastrointestinal system: Abdomen mildly distended but soft and nontender.  Normal bowel sounds heard. Central nervous system: alert and oriented x2.  Extremities: trace edema Skin: No rashes, lesions or ulcers   Data Reviewed: I have personally reviewed following labs  and imaging studies  CBC: Recent Labs  Lab 11/03/18 0924 11/04/18 0411 11/05/18 0929 11/06/18 0502 11/07/18 0426  WBC 15.1* 12.8* 6.9 5.1 5.7  HGB 9.4* 9.0* 9.0* 8.0* 8.8*  HCT 31.5* 29.9* 30.1* 27.3* 29.9*  MCV 96.9 96.1 96.5 97.2 95.2  PLT 121* 112* 116* 99* 91*   Basic Metabolic Panel: Recent Labs  Lab 11/02/18 0622 11/03/18 0924 11/05/18 1229 11/06/18 0502 11/07/18 0426  NA 142 141 141 144 142  K 3.9 4.8 4.0 4.0 4.4  CL 108 107 105 108 107  CO2 _0 GLUCOSE 82 128* 103* 113* 91  BUN 38* 41* 55* 52* 49*  CREATININE 0.77 0.94 0.88 0.80 0.77  CALCIUM 7.2* 7.3* 6.9* 7.2* 7.7*   GFR: Estimated Creatinine Clearance: 52.3 mL/min (by C-G formula based on SCr of 0.77 mg/dL). Liver Function Tests: Recent Labs  Lab 11/01/18 1507 11/06/18 0502  AST 27 26  ALT 15 13  ALKPHOS 113 94  BILITOT 0.8 0.4  PROT 6.1* 4.7*  ALBUMIN 2.7* 1.9*   Recent Labs  Lab 11/01/18 1507  LIPASE 30   Cardiac Enzymes: Recent Labs  Lab 11/01/18 1507  TROPONINI <0.03   CBG: Recent Labs  Lab 11/01/18 1631  GLUCAP 85     Recent Results (from the past 240 hour(s))  Culture, body fluid-bottle     Status: None (Preliminary result)   Collection Time: 11/05/18 10:37 AM  Result Value Ref Range Status   Specimen Description PERITONEAL  Final   Special Requests NONE  Final   Culture   Final    NO GROWTH 2 DAYS Performed at [facility], [address] [place], [place] [address]    Report Status PENDING  Incomplete  Gram stain     Status: None   Collection Time: 11/05/18 10:37 AM  Result Value Ref Range Status   Specimen Description PERITONEAL  Final   Special Requests NONE  Final   Gram Stain   Final    MODERATE WBC PRESENT, PREDOMINANTLY MONONUCLEAR NO ORGANISMS SEEN Performed at [facility], [address] [place], [place] [address]    Report Status 11/05/2018 FINAL  Final         Radiology Studies: No results found.      Scheduled  Meds: . furosemide  20 mg Intravenous BID  . mirtazapine  7.5 mg Oral QHS  .  morphine injection  1 mg Intravenous Q6H  . senna-docusate  1 tablet Oral BID   Continuous Infusions:    LOS: 5 days    Vernell Leep, MD, FACP, [facility]. [facility] Pager [phone]  If 7PM-7AM, please contact night-coverage [email or web address] Password [facility] 11/07/2018, 5:14 PM

## 2018-11-07 NOTE — Progress Notes (Signed)
Subjective: The patient is seen and examined today.  Her niece was at the bedside.  This is a very pleasant 72 years old Hispanic female who was diagnosed with metastatic non-small cell lung cancer, adenocarcinoma in July 2013.  She underwent several treatment regimens including targeted therapy with Tarceva for 3 years followed by disease progression.  The patient was then treated with Tagrisso for more than 30 months but she developed disease progression.  We added treatment with Cyramza to her current treatment with Tagrisso status post 2 cycles.  Unfortunately the patient continues to have significant disease progression with recurrent ascites as well as worsening of her disease.  She was admitted to the hospital with failure to thrive.  Her performance status has significantly declined.  Objective: Vital signs in last 24 hours: Temp:  [97.8 F (36.6 C)] 97.8 F (36.6 C) (12/11 2032) Pulse Rate:  [96-99] 99 (12/12 0437) Resp:  [8-20] 9 (12/12 0439) BP: (117-131)/(59-61) 131/61 (12/12 0437) SpO2:  [100 %] 100 % (12/12 0437)  Intake/Output from previous day: 12/11 0701 - 12/12 0700 In: 60 [P.O.:60] Out: 200 [Urine:200] Intake/Output this shift: No intake/output data recorded.  General appearance: alert, cooperative, cachectic, fatigued and no distress Resp: clear to auscultation bilaterally Cardio: regular rate and rhythm, S1, S2 normal, no murmur, click, rub or gallop GI: soft, non-tender; bowel sounds normal; no masses,  no organomegaly Extremities: edema 1+  Lab Results:  Recent Labs    11/06/18 0502 11/07/18 0426  WBC 5.1 5.7  HGB 8.0* 8.8*  HCT 27.3* 29.9*  PLT 99* 91*   BMET Recent Labs    11/06/18 0502 11/07/18 0426  NA 144 142  K 4.0 4.4  CL 108 107  CO2 31 30  GLUCOSE 113* 91  BUN 52* 49*  CREATININE 0.80 0.77  CALCIUM 7.2* 7.7*    Studies/Results: No results found.  Medications: I have reviewed the patient's current medications.  CODE STATUS: No  CODE BLUE  Assessment/Plan: This is a very pleasant 72 years old Hispanic female with stage IV non-small cell lung cancer diagnosed in July 2013 status post multiple treatment regimen mainly was targeted therapy and has been doing very good for several years but unfortunately her disease progressed to the point that further treatment options will be ineffective and more toxic at this point. I had a lengthy discussion with the patient and her niece about her current condition, prognosis and treatment options. I strongly recommended for the patient to consider palliative care and hospice at this point. We will discontinue all her current treatment. The patient and her niece are in agreement and they would like her to be discharged to a hospice facility for end-of-life care. I will continue to support the patient with her hospice need and end-of-life care. Thank you so much for taking good care of Jacqueline Leonard.   LOS: 5 days    Jacqueline Leonard 11/07/2018

## 2018-11-07 NOTE — Progress Notes (Signed)
Dr. Rowe Pavy Palliative Care updated on Pt's condition and will come to see the Pt and Pt's Family. Chaplain is at bedside.

## 2018-11-07 NOTE — Progress Notes (Signed)
The Pt is noted to have this AM shallow respirations and decreased responsiveness. Pt will briefly open eyes to name called. Pt's family had been called by Night RN to come in and are now in the room. Pt's family is requesting the Chaplain and the [facility] has been paged for the family. Support and Comfort given to Pt's Family.

## 2018-11-07 NOTE — Progress Notes (Signed)
Nutrition Brief Note  Patient seen by RD 12/8 for full assessment. Chart reviewed. Dr. Rowe Pavy met with family this AM and patient is now transitioning to comfort care. No further nutrition interventions warranted at this time. Please re-consult as needed.     Jarome Matin, MS, RD, LDN, [facility] Inpatient Clinical Dietitian Pager # [phone] After hours/weekend pager # [phone]

## 2018-11-07 NOTE — Progress Notes (Signed)
Pt's family called RN to room. Pt with increasing pain. Dr Rowe Pavy Palliative Care updated via phone and new orders to be placed.

## 2018-11-07 NOTE — Progress Notes (Signed)
Family and friends at bedside at start of shift. Pt crying doe snot want to be alone, friend staying the night. Mouth dry, swabbed, oral gel applied. Witholding PO meds at this time, pt much better after morphine administration. Warm blanket provided, callbell within reach, bed alarm on. Pt resting eyes closed rise and fall of chest noted.

## 2018-11-07 NOTE — Progress Notes (Signed)
Daily Progress Note   Patient Name: Annaelle Kasel       Date: 11/07/2018 DOB: 1946-04-17  Age: 72 y.o. MRN#: [id number] Attending Physician: Modena Jansky, MD Primary Care Physician: Rutherford Guys, MD Admit Date: 11/01/2018  Reason for Consultation/Follow-up: Establishing goals of care  Subjective:   Ms. Bissette is awake and complains of generalized pain She has been having shallow breathing and has been less alert since this am, I was alerted by her bedside RN about a change in her condition.   Her niece Ophelia Shoulder is at the bedside, her sister is also there. I have spoken with her other niece [name] by phone.   See below   Length of Stay: 5  Current Medications: Scheduled Meds:  . famotidine  20 mg Oral BID  . feeding supplement (ENSURE ENLIVE)  237 mL Oral TID BM  . furosemide  20 mg Intravenous BID  . metoCLOPramide  5 mg Oral TID AC & HS  . mirtazapine  7.5 mg Oral QHS  . senna-docusate  1 tablet Oral BID    Continuous Infusions:   PRN Meds: acetaminophen **OR** acetaminophen, alum & mag hydroxide-simeth, HYDROcodone-homatropine, morphine CONCENTRATE, ondansetron **OR** ondansetron (ZOFRAN) IV, sodium chloride flush, sodium phosphate  Physical Exam         General: awake but less alert, in mild acute distress. Chronically ill appearing  HEENT: No bruits, no goiter, no JVD Heart: Regular rate and rhythm. No murmur appreciated. Lungs: shallow diminished breath sounds Abdomen: Soft, nontender, nondistended, positive bowel sounds.  Ext: No significant edema.  Arm in sling Skin: Warm and dry Neuro: Grossly intact, nonfocal.   Vital Signs: BP 131/61 (BP Location: Right Arm)   Pulse 99   Temp 97.8 F (36.6 C) (Oral)   Resp (!) 9   Ht 5\' 1"  (1.549 m)   Wt 62.6  kg   SpO2 100%   BMI 26.07 kg/m  SpO2: SpO2: 100 % O2 Device: O2 Device: Nasal Cannula O2 Flow Rate: O2 Flow Rate (L/min): 3 L/min  Intake/output summary:   Intake/Output Summary (Last 24 hours) at 11/07/2018 0854 Last data filed at 11/07/2018 0600 Gross per 24 hour  Intake 60 ml  Output 200 ml  Net -140 ml   LBM: Last BM Date: 11/04/18 Baseline Weight: Weight: 62.6 kg Most recent weight:  Weight: 62.6 kg       Palliative Assessment/Data:      Patient Active Problem List   Diagnosis Date Noted  . Pressure injury of skin 11/07/2018  . Weakness 11/02/2018  . Generalized weakness 11/01/2018  . Failure to thrive in adult 11/01/2018  . Traumatic closed nondisplaced fracture of acromial end of clavicle, left, initial encounter 11/01/2018  . Peripheral edema 11/01/2018  . Pleural effusion 10/08/2018  . Acute on chronic respiratory failure with hypoxia ([place]) 10/08/2018  . Dyspnea   . Goals of care, counseling/discussion 09/23/2018  . Pleural effusion, left 09/05/2018  . Malnutrition of moderate degree 09/05/2018  . Pelvic mass in female 09/02/2018  . Genetic testing 08/30/2018  . Family history of breast cancer   . Family history of lung cancer   . Lower urinary tract infectious disease 12/26/2016  . Acute cystitis without hematuria   . Hyponatremia   . Epistaxis 10/04/2016  . Adenocarcinoma of lung, stage 4, left ([place]) 06/13/2016  . Bone metastases ([place]) 04/11/2016  . Encounter for antineoplastic chemotherapy 02/14/2016  . Anemia in neoplastic disease 06/29/2015  . Pulmonary metastasis ([place]) 07/16/2014  . Vitamin B12 deficiency anemia 02/12/2014  . Secondary malignant neoplasm of bone and bone marrow ([place]) 06/29/2012  . Metastatic adenocarcinoma, pathology 06/19/12 06/27/2012  . Hypertension   . Hyperlipidemia   . Vitamin D deficiency     Palliative Care Assessment & Plan   Assessment: Patient Active Problem List   Diagnosis Date Noted  . Pressure injury of  skin 11/07/2018  . Weakness 11/02/2018  . Generalized weakness 11/01/2018  . Failure to thrive in adult 11/01/2018  . Traumatic closed nondisplaced fracture of acromial end of clavicle, left, initial encounter 11/01/2018  . Peripheral edema 11/01/2018  . Pleural effusion 10/08/2018  . Acute on chronic respiratory failure with hypoxia ([place]) 10/08/2018  . Dyspnea   . Goals of care, counseling/discussion 09/23/2018  . Pleural effusion, left 09/05/2018  . Malnutrition of moderate degree 09/05/2018  . Pelvic mass in female 09/02/2018  . Genetic testing 08/30/2018  . Family history of breast cancer   . Family history of lung cancer   . Lower urinary tract infectious disease 12/26/2016  . Acute cystitis without hematuria   . Hyponatremia   . Epistaxis 10/04/2016  . Adenocarcinoma of lung, stage 4, left ([place]) 06/13/2016  . Bone metastases ([place]) 04/11/2016  . Encounter for antineoplastic chemotherapy 02/14/2016  . Anemia in neoplastic disease 06/29/2015  . Pulmonary metastasis ([place]) 07/16/2014  . Vitamin B12 deficiency anemia 02/12/2014  . Secondary malignant neoplasm of bone and bone marrow ([place]) 06/29/2012  . Metastatic adenocarcinoma, pathology 06/19/12 06/27/2012  . Hypertension   . Hyperlipidemia   . Vitamin D deficiency    Recommendations/Plan: -DNR/DNI -Discussed with family in the room, as well as over the phone about overall goals of care.  Comfort measures only Focus on pain and non pain symptoms CSW consult for residential hospice, how ever, if patient enters into active dying stages, then she might not be able to be d/c to hospice. Will add IV PRN and scheduled low dose Morphine Would withhold PO medications at this stage Appreciate chaplain support Comfort cart for family when other niece arrives Overall goals are for the patient to be kept as comfortable as possible   PPS 20% Code Status:    Code Status Orders  (From admission, onward)         Start     Ordered     11/02/18  0005  Do not attempt resuscitation (DNR)  Continuous    Question Answer Comment  In the event of cardiac or respiratory ARREST Do not call a "code blue"   In the event of cardiac or respiratory ARREST Do not perform Intubation, CPR, defibrillation or ACLS   In the event of cardiac or respiratory ARREST Use medication by any route, position, wound care, and other measures to relive pain and suffering. May use oxygen, suction and manual treatment of airway obstruction as needed for comfort.      11/02/18 0006        Code Status History    Date Active Date Inactive Code Status Order ID Comments User Context   10/08/2018 0208 10/09/2018 1413 Full Code [id number]  Rise Patience, MD Inpatient   09/05/2018 0209 09/08/2018 1644 Full Code [id number]  Vianne Bulls, MD ED       Prognosis:   Guarded  Less than 2 weeks  Discharge Planning:  Residential hospice versus anticipated hospital death.   Care plan was discussed with patient, nieces, sister   Thank you for allowing the Palliative Medicine Team to assist in the care of this patient.   Total Time 35 Prolonged Time Billed No      Greater than 50%  of this time was spent counseling and coordinating care related to the above assessment and plan.  Loistine Chance, MD [phone] Please contact Palliative Medicine Team phone at [phone] for questions and concerns.

## 2018-11-08 MED ORDER — ATROPINE SULFATE 1 % OP SOLN
2.0000 [drp] | Freq: Four times a day (QID) | OPHTHALMIC | Status: DC | PRN
  Filled 2018-11-08: qty 2

## 2018-11-08 MED ORDER — LORAZEPAM 2 MG/ML IJ SOLN
1.0000 mg | Freq: Once | INTRAMUSCULAR | Status: AC
  Administered 2018-11-08: 1 mg via INTRAVENOUS
  Filled 2018-11-08: qty 1

## 2018-11-08 NOTE — Progress Notes (Signed)
   11/08/18 1436  Clinical Encounter Type  Visited With Patient and family together  Visit Type Follow-up  Referral From Palliative care team  Consult/Referral To Chaplain  The chaplain attempted to follow up with spiritual care visit for Pt. and family.  The Pt. RN identified the female visitor in the room as a family member.  The visitor declined the visit for the Pt., stating the Pt. wanted to rest.  The chaplain offered spiritual care as needed.

## 2018-11-08 NOTE — Progress Notes (Signed)
Daily Progress Note   Patient Name: Jacqueline Leonard       Date: 11/08/2018 DOB: June 27, 1946  Age: 72 y.o. MRN#: [id number] Attending Physician: Modena Jansky, MD Primary Care Physician: Jacqueline Guys, MD Admit Date: 11/01/2018  Reason for Consultation/Follow-up: Establishing goals of care  Subjective:   Both Jacqueline Leonard and a female family member are resting comfortably, she appears to be in no distress.   Medication needs noted, the patient has required 5 doses of 1 mg IV Morphine in the past 24 hours.    See below   Length of Stay: 6  Current Medications: Scheduled Meds:  . furosemide  20 mg Intravenous BID  . mirtazapine  7.5 mg Oral QHS  .  morphine injection  1 mg Intravenous Q6H  . senna-docusate  1 tablet Oral BID    Continuous Infusions:   PRN Meds: acetaminophen **OR** acetaminophen, alum & mag hydroxide-simeth, atropine, HYDROcodone-homatropine, morphine injection, morphine CONCENTRATE, ondansetron **OR** ondansetron (ZOFRAN) IV, sodium chloride flush, sodium phosphate  Physical Exam         General: awake but less alert, in mild acute distress. Chronically ill appearing  HEENT: No bruits, no goiter, no JVD Heart: Regular rate and rhythm. No murmur appreciated. Lungs: shallow diminished breath sounds Abdomen: Soft, nontender, nondistended, positive bowel sounds.  Ext: No significant edema.  Arm in sling Skin: Warm and dry Neuro: Grossly intact, nonfocal.   Vital Signs: BP (!) 144/56 (BP Location: Right Arm)   Pulse (!) 114   Temp (!) 97.3 F (36.3 C) (Oral)   Resp 20   Ht 5\' 1"  (1.549 m)   Wt 62.6 kg   SpO2 100%   BMI 26.07 kg/m  SpO2: SpO2: 100 % O2 Device: O2 Device: Nasal Cannula O2 Flow Rate: O2 Flow Rate (L/min): 2.5 L/min  Intake/output  summary:   Intake/Output Summary (Last 24 hours) at 11/08/2018 1432 Last data filed at 11/07/2018 1933 Gross per 24 hour  Intake -  Output 100 ml  Net -100 ml   LBM: Last BM Date: 11/04/18 Baseline Weight: Weight: 62.6 kg Most recent weight: Weight: 62.6 kg       Palliative Assessment/Data:      Patient Active Problem List   Diagnosis Date Noted  . Pressure injury of skin 11/07/2018  . Palliative care by  specialist   . Dying care   . Weakness 11/02/2018  . Generalized weakness 11/01/2018  . Failure to thrive in adult 11/01/2018  . Traumatic closed nondisplaced fracture of acromial end of clavicle, left, initial encounter 11/01/2018  . Peripheral edema 11/01/2018  . Pleural effusion 10/08/2018  . Acute on chronic respiratory failure with hypoxia ([place]) 10/08/2018  . Dyspnea   . Goals of care, counseling/discussion 09/23/2018  . Pleural effusion, left 09/05/2018  . Malnutrition of moderate degree 09/05/2018  . Pelvic mass in female 09/02/2018  . Genetic testing 08/30/2018  . Family history of breast cancer   . Family history of lung cancer   . Lower urinary tract infectious disease 12/26/2016  . Acute cystitis without hematuria   . Hyponatremia   . Epistaxis 10/04/2016  . Adenocarcinoma of lung, stage 4, left ([place]) 06/13/2016  . Bone metastases ([place]) 04/11/2016  . Encounter for antineoplastic chemotherapy 02/14/2016  . Anemia in neoplastic disease 06/29/2015  . Pulmonary metastasis ([place]) 07/16/2014  . Vitamin B12 deficiency anemia 02/12/2014  . Secondary malignant neoplasm of bone and bone marrow ([place]) 06/29/2012  . Metastatic adenocarcinoma, pathology 06/19/12 06/27/2012  . Hypertension   . Hyperlipidemia   . Vitamin D deficiency     Palliative Care Assessment & Plan   Assessment: Patient Active Problem List   Diagnosis Date Noted  . Pressure injury of skin 11/07/2018  . Palliative care by specialist   . Dying care   . Weakness 11/02/2018  . Generalized  weakness 11/01/2018  . Failure to thrive in adult 11/01/2018  . Traumatic closed nondisplaced fracture of acromial end of clavicle, left, initial encounter 11/01/2018  . Peripheral edema 11/01/2018  . Pleural effusion 10/08/2018  . Acute on chronic respiratory failure with hypoxia ([place]) 10/08/2018  . Dyspnea   . Goals of care, counseling/discussion 09/23/2018  . Pleural effusion, left 09/05/2018  . Malnutrition of moderate degree 09/05/2018  . Pelvic mass in female 09/02/2018  . Genetic testing 08/30/2018  . Family history of breast cancer   . Family history of lung cancer   . Lower urinary tract infectious disease 12/26/2016  . Acute cystitis without hematuria   . Hyponatremia   . Epistaxis 10/04/2016  . Adenocarcinoma of lung, stage 4, left ([place]) 06/13/2016  . Bone metastases ([place]) 04/11/2016  . Encounter for antineoplastic chemotherapy 02/14/2016  . Anemia in neoplastic disease 06/29/2015  . Pulmonary metastasis ([place]) 07/16/2014  . Vitamin B12 deficiency anemia 02/12/2014  . Secondary malignant neoplasm of bone and bone marrow ([place]) 06/29/2012  . Metastatic adenocarcinoma, pathology 06/19/12 06/27/2012  . Hypertension   . Hyperlipidemia   . Vitamin D deficiency    Recommendations/Plan: -DNR/DNI Comfort measures only Focus on pain and non pain symptoms CSW consult for residential hospice, how ever, if patient enters into active dying stages, then she might not be able to be d/c to hospice. Will add IV PRN and scheduled low dose Morphine, consider transfer to hospice, if there is a bed available.  Would withhold PO medications  Appreciate chaplain support Comfort cart for family  Overall goals are for the patient to be kept as comfortable as possible   PPS 20% Code Status:    Code Status Orders  (From admission, onward)         Start     Ordered   11/02/18 0005  Do not attempt resuscitation (DNR)  Continuous    Question Answer Comment  In the event of cardiac or  respiratory ARREST Do not  call a "code blue"   In the event of cardiac or respiratory ARREST Do not perform Intubation, CPR, defibrillation or ACLS   In the event of cardiac or respiratory ARREST Use medication by any route, position, wound care, and other measures to relive pain and suffering. May use oxygen, suction and manual treatment of airway obstruction as needed for comfort.      11/02/18 0006        Code Status History    Date Active Date Inactive Code Status Order ID Comments User Context   10/08/2018 0208 10/09/2018 1413 Full Code [id number]  Rise Patience, MD Inpatient   09/05/2018 0209 09/08/2018 1644 Full Code [id number]  Vianne Bulls, MD ED       Prognosis:   Guarded  Less than 2 weeks  Discharge Planning:  Residential hospice versus anticipated hospital death.   Care plan was discussed with patient, nieces, sister   Thank you for allowing the Palliative Medicine Team to assist in the care of this patient.   Total Time 25 Prolonged Time Billed No      Greater than 50%  of this time was spent counseling and coordinating care related to the above assessment and plan.  Loistine Chance, MD [phone] Please contact Palliative Medicine Team phone at [phone] for questions and concerns.

## 2018-11-08 NOTE — Progress Notes (Signed)
PROGRESS NOTE    Jacqueline Leonard  QTM:[id number] DOB: [birth year] DOA: 11/01/2018 PCP: Rutherford Guys, MD   Brief Narrative: Jacqueline Leonard is a 72 y.o. female with with metastatic adenocarcinoma of the lung on [place] who was recently admitted last month for pleural effusion underwent thoracentesis had a fall 2 weeks ago but since then patient has been having left shoulder pain.  Denies losing consciousness.  Patient has been having progressive peripheral edema for which patient was started on Lasix by patient's oncologist 2 days ago.  Due to the left shoulder pain patient came to the ER.  Patient also has been having poor appetite with some nausea denies vomiting or diarrhea.  Noticed increasing abdominal girth peripheral edema. Patient presented with shortness of breath, progressive weakness. FTT.  Condition rapidly declined on 12/12 and with assistance of PMT, she was transitioned to full comfort care and possible residential hospice discharge versus hospital death.  Assessment & Plan:   Principal Problem:   Failure to thrive in adult Active Problems:   Metastatic adenocarcinoma, pathology 06/19/12   Lower urinary tract infectious disease   Pleural effusion, left   Malnutrition of moderate degree   Generalized weakness   Traumatic closed nondisplaced fracture of acromial end of clavicle, left, initial encounter   Peripheral edema   Weakness   Pressure injury of skin   Palliative care by specialist   Dying care  1 Acute hypoxic respiratory failure; In the setting of pleural effusion, pulmonary edema. Continue with IV Lasix. Had  thoracentesis 12-07, 600 cc fluids removed.  Dyspnea improved.  Also likely due to abdominal distention from ascites, now status post paracentesis. Patient rapidly declined on 12/12 and was transitioned to full comfort care. As of 12/13 morning, patient appeared to be comfortable and did not seem to be imminently dying which could however change in short  notice.  Spouse at bedside.  Clinical social work to follow-up regarding residential hospice placement.  2-failure to thrive, poor appetite, progressive weakness. Palliative care consultation 12/9 appreciated. PMT have communicated with patient's primary oncologist to get his input to guide patient and family if she will benefit from any further treatment for her cancer.  She expressed to family that she is not interested in continuing chemotherapy if it is only likely to add weeks to her life and she is considering hospice support.  I discussed with Dr. [place]ien Nordmann on 12/11 and he was planning to come by and meet with patient and family.  Due to rapid decline in her clinical condition, PMT saw her today and based on her wishes and discussion with family, she was transitioned to full comfort care on 12/12.   Abdominal distension; Ascites;  Underwent paracentesis, diagnostic and therapeutic on 12/10 yielding 3.3 L of fluid..  Ascitic fluid culture negative to date.  Follow cytology results. Added Roxanol for abdominal pain.  Left clavicle fracture continue with sling.  Follow-up outpatient Pain Management  UTI: treated with  Ceftriaxone, finished 4 days.  UA: With more than 50 white blood cells.  Metastatic non-small cell lung cancer , with positive EGFR, on requiring thoracentesis previously. Follow with Dr. [place]ien Nordmann.  Anemia from malignancy: Follow hemoglobin Hemoglobin is dropped from 9 to 8 g.  FOBT +.  Dark stool noted but patient denies.  Hemoglobin stable.  Thrombocytopenia: Relatively stable.  Probably related to chemo meds.  Moderate malnutrition; in setting chronic illness.    RN Pressure Injury Documentation: Pressure Injury 11/06/18 Stage II -  Partial thickness loss of  dermis presenting as a shallow open ulcer with a red, pink wound bed without slough. (Active)  11/06/18 2130  Location: Sacrum  Location Orientation: Medial  Staging: Stage II -  Partial thickness loss of dermis  presenting as a shallow open ulcer with a red, pink wound bed without slough.  Wound Description (Comments):   Present on Admission:     Malnutrition Type: Moderate malnutrition.  Management per RD input.  Nutrition Problem: Moderate Malnutrition Etiology: chronic illness, cancer and cancer related treatments    DVT prophylaxis: SCDs Code Status: DNR Family Communication: Discussed in detail with patient in the presence of her niece and best friend at bedside this morning. Disposition Plan: DC to residential hospice versus hospital death.   Consultants:   Palliative care  Dr. Earlie Server will be added to the rounding team.  I discussed in detail with Dr. Julien Nordmann who will try to see patient 12/12.  Procedures:  Thoracentesis 11/02/2018 Paracentesis 11/06/2018.     Antimicrobials:  Ceftriaxone   Subjective: Patient seen on rounds this morning.  She was resting/sleeping comfortably and did not wake her up.  Spouse at bedside indicated no complaints.   Objective: Vitals:   11/07/18 0437 11/07/18 0439 11/07/18 1915 11/07/18 2135  BP: 131/61  (!) 143/79 (!) 144/56  Pulse: 99  (!) 116 (!) 114  Resp: (!) 8 (!) _0 Temp:   98.1 F (36.7 C) (!) 97.3 F (36.3 C)  TempSrc:   Oral Oral  SpO2: 100%  100% 100%  Weight:      Height:        Intake/Output Summary (Last 24 hours) at 11/08/2018 1624 Last data filed at 11/07/2018 1933 Gross per 24 hour  Intake -  Output 100 ml  Net -100 ml   Filed Weights   11/01/18 1454  Weight: 62.6 kg    Examination:  General exam: Elderly female, small built, chronically ill looking lying comfortably supine in bed without distress.  No distress. Respiratory system; diminished breath sounds in the bases but otherwise clear to auscultation.  No increased work of breathing.  Has right Port-A-Cath.  No change/stable. Cardiovascular system: S 1, S 2 RRR.  No JVD.  Trace bilateral ankle edema.  Gastrointestinal system: Abdomen mildly  distended but soft and nontender.  Normal bowel sounds heard. Central nervous system: Sleeping. Extremities: trace edema Skin: No rashes, lesions or ulcers   Data Reviewed: I have personally reviewed following labs and imaging studies  CBC: Recent Labs  Lab 11/03/18 0924 11/04/18 0411 11/05/18 0929 11/06/18 0502 11/07/18 0426  WBC 15.1* 12.8* 6.9 5.1 5.7  HGB 9.4* 9.0* 9.0* 8.0* 8.8*  HCT 31.5* 29.9* 30.1* 27.3* 29.9*  MCV 96.9 96.1 96.5 97.2 95.2  PLT 121* 112* 116* 99* 91*   Basic Metabolic Panel: Recent Labs  Lab 11/02/18 0622 11/03/18 0924 11/05/18 1229 11/06/18 0502 11/07/18 0426  NA 142 141 141 144 142  K 3.9 4.8 4.0 4.0 4.4  CL 108 107 105 108 107  CO2 _1 GLUCOSE 82 128* 103* 113* 91  BUN 38* 41* 55* 52* 49*  CREATININE 0.77 0.94 0.88 0.80 0.77  CALCIUM 7.2* 7.3* 6.9* 7.2* 7.7*   GFR: Estimated Creatinine Clearance: 52.3 mL/min (by C-G formula based on SCr of 0.77 mg/dL). Liver Function Tests: Recent Labs  Lab 11/06/18 0502  AST 26  ALT 13  ALKPHOS 94  BILITOT 0.4  PROT 4.7*  ALBUMIN 1.9*   No results for input(s):  LIPASE, AMYLASE in the last 168 hours. Cardiac Enzymes: No results for input(s): CKTOTAL, CKMB, CKMBINDEX, TROPONINI in the last 168 hours. CBG: Recent Labs  Lab 11/01/18 1631  GLUCAP 85     Recent Results (from the past 240 hour(s))  Culture, body fluid-bottle     Status: None (Preliminary result)   Collection Time: 11/05/18 10:37 AM  Result Value Ref Range Status   Specimen Description PERITONEAL  Final   Special Requests NONE  Final   Culture   Final    NO GROWTH 3 DAYS Performed at [facility], [address] [place], [place] [address]    Report Status PENDING  Incomplete  Gram stain     Status: None   Collection Time: 11/05/18 10:37 AM  Result Value Ref Range Status   Specimen Description PERITONEAL  Final   Special Requests NONE  Final   Gram Stain   Final    MODERATE WBC PRESENT,  PREDOMINANTLY MONONUCLEAR NO ORGANISMS SEEN Performed at [facility], [address], [place], [place] [address]    Report Status 11/05/2018 FINAL  Final         Radiology Studies: No results found.      Scheduled Meds: . furosemide  20 mg Intravenous BID  . mirtazapine  7.5 mg Oral QHS  .  morphine injection  1 mg Intravenous Q6H  . senna-docusate  1 tablet Oral BID   Continuous Infusions:    LOS: 6 days    Vernell Leep, MD, FACP, [facility]. [facility] Pager [phone]  If 7PM-7AM, please contact night-coverage [email or web address] Password [email or web address] 11/08/2018, 4:24 PM

## 2018-11-08 NOTE — Care Management Important Message (Signed)
Important Message  Patient Details  Name: Jacqueline Leonard MRN: [id number] Date of Birth: [birth year]   Medicare Important Message Given:  Yes    Kerin Salen 11/08/2018, 12:36 [place] Message  Patient Details  Name: Jacqueline Leonard MRN: [id number] Date of Birth: 07/08/1946   Medicare Important Message Given:  Yes    Kerin Salen 11/08/2018, 12:35 PM

## 2018-11-09 DIAGNOSIS — E440 Moderate protein-calorie malnutrition: Secondary | ICD-10-CM

## 2018-11-09 MED ORDER — MORPHINE SULFATE (CONCENTRATE) 10 MG/0.5ML PO SOLN: 5.0000 mg | ORAL | Status: AC | PRN

## 2018-11-09 NOTE — Discharge Summary (Signed)
Physician Discharge Summary  Jacqueline Leonard PYP:[id number] DOB: 01/31/1946  PCP: Rutherford Guys, MD  Admit date: 11/01/2018 Discharge date: 11/09/2018  Recommendations for Outpatient Follow-up:  1. Patient being discharged to [facility] for end-of-life hospice care.  Home Health: N/A Equipment/Devices: N/A    Discharge Condition: Extremely poor prognosis. CODE STATUS: DNR Diet recommendation: Regular diet  Discharge Diagnoses:  Principal Problem:   Failure to thrive in adult Active Problems:   Metastatic adenocarcinoma, pathology 06/19/12   Lower urinary tract infectious disease   Pleural effusion, left   Malnutrition of moderate degree   Generalized weakness   Traumatic closed nondisplaced fracture of acromial end of clavicle, left, initial encounter   Peripheral edema   Weakness   Pressure injury of skin   Palliative care by specialist   Dying care   Brief Summary: Jacqueline Bustillois a 72 y.o.femalewithmetastatic adenocarcinoma of the lung on [place] who was recently admitted last month for pleural effusion underwent thoracentesis, had a fall 2 weeks ago but since then patient has been having left shoulder pain. Denies losing consciousness. Patient has been having progressive peripheral edema for which patient was started on Lasix by patient's oncologist 2 days pta. Due to the left shoulder pain patient came to the ER.Patient also has been having poor appetite with some nausea denies vomiting or diarrhea. Noticed increasing abdominal girth peripheral edema. Patient presented with shortness of breath, progressive weakness. FTT.  Condition rapidly declined on 12/12 and with assistance of PMT, she was transitioned to full comfort care.  Assessment & Plan:  1 Acute hypoxic respiratory failure; In the setting of pleural effusion, pulmonary edema. Treated in the hospital with IV Lasix. Had  thoracentesis 12-07, 600 cc fluids removed.  Dyspnea improved and now managed  with morphine.  Also likely due to abdominal distention from ascites, now status post paracentesis. Patient rapidly declined on 12/12 and was transitioned to full comfort care. On today's visit, patient alert and oriented, spouse by bedside, patient denies complaints and appears comfortable.  I discussed with PMT MD and patient will be discharged today to [facility] on [place] as needed and all medications nonessential to comfort will be discontinued.  2-failure to thrive, poor appetite, progressive weakness. Palliative care was consulted and assisted with goals of care.  Discussed with patient's primary oncologist who indicated that patient was leaning towards no further treatment for her malignancy and hence hospice care.  Patient expressed to family that she is not interested in continuing chemotherapy if it is only likely to add weeks to her life and she is considering hospice support. Due to rapid decline in her clinical condition, PMT saw her and based on her wishes and after discussion with family, she was transitioned to full comfort care on 12/12.  Abdominal distension; Ascites;  Underwent paracentesis, diagnostic and therapeutic on 12/10 yielding 3.3 L of fluid..  Ascitic fluid culture negative to date.  Cytology confirmed metastatic adenocarcinoma in ascitic fluid. Abdominal pain now controlled on morphine.  Left clavicle fracture continue with sling.   No pain reported.  UTI:  Completed treatment.  Metastatic non-small cell lung cancer , with positive EGFR, on requiring thoracentesis previously. No further treatment.  Now on hospice care.  Anemia from malignancy:  Hemoglobin is dropped from 9 to 8 g.  FOBT +.  Dark stool noted but patient denies.  Hemoglobin stable.  Thrombocytopenia: Relatively stable.  Probably related to chemo meds.  Moderate malnutrition; in setting chronic illness.    RN Pressure Injury  Documentation: Pressure Injury 11/06/18 Stage II -   Partial thickness loss of dermis presenting as a shallow open ulcer with a red, pink wound bed without slough. (Active)  11/06/18 2130  Location: Sacrum  Location Orientation: Medial  Staging: Stage II -  Partial thickness loss of dermis presenting as a shallow open ulcer with a red, pink wound bed without slough.  Wound Description (Comments):   Present on Admission:     Malnutrition Type: Moderate malnutrition.    Nutrition Problem: Moderate Malnutrition Etiology: chronic illness, cancer and cancer related treatments    Consultants:   Palliative care  IR   Procedures:  Thoracentesis 11/02/2018 Paracentesis 11/06/2018.    Discharge Instructions  Discharge Instructions    Call MD for:  difficulty breathing, headache or visual disturbances   Complete by:  As directed    Call MD for:  extreme fatigue   Complete by:  As directed    Call MD for:  persistant dizziness or light-headedness   Complete by:  As directed    Call MD for:  persistant nausea and vomiting   Complete by:  As directed    Call MD for:  severe uncontrolled pain   Complete by:  As directed    Call MD for:  temperature >100.4   Complete by:  As directed    Diet general   Complete by:  As directed    Increase activity slowly   Complete by:  As directed        Medication List    STOP taking these medications   cholecalciferol 1000 units tablet Commonly known as:  VITAMIN D   escitalopram 10 MG tablet Commonly known as:  LEXAPRO   furosemide 20 MG tablet Commonly known as:  LASIX   HYDROcodone-homatropine 5-1.5 MG/5ML syrup Commonly known as:  HYCODAN   lidocaine-prilocaine cream Commonly known as:  EMLA   methylPREDNISolone 4 MG Tbpk tablet Commonly known as:  MEDROL DOSEPAK   mirtazapine 30 MG tablet Commonly known as:  REMERON   mirtazapine 7.5 MG tablet Commonly known as:  REMERON   NYQUIL SEVERE COLD/FLU PO   ondansetron 4 MG tablet Commonly known as:  ZOFRAN    predniSONE 5 MG tablet Commonly known as:  DELTASONE     TAKE these medications   morphine CONCENTRATE 10 MG/0.5ML Soln concentrated solution Place 0.25 mLs (5 mg total) under the tongue every 3 (three) hours as needed for moderate pain, severe pain, anxiety or shortness of breath.      Follow-up Information    MD at [facility] Follow up.   Why:  Follow up for EOL Hospice Care.         Allergies  Allergen Reactions  . Vancomycin Other (See Comments)    Red face and chest  . Penicillins Palpitations and Other (See Comments)    Has patient had a PCN reaction causing immediate rash, facial/tongue/throat swelling, SOB or lightheadedness with hypotension: No Has patient had a PCN reaction causing severe rash involving mucus membranes or skin necrosis: No Has patient had a PCN reaction that required hospitalization No Has patient had a PCN reaction occurring within the last 10 years: Yes If all of the above answers are "NO", then may proceed with Cephalosporin use.      Procedures/Studies: Dg Chest 1 View  Result Date: 11/02/2018 CLINICAL DATA:  72 year old female status post left thoracentesis EXAM: CHEST  1 VIEW COMPARISON:  Pre thoracentesis chest x-ray 11/01/2018 FINDINGS: Right subclavian approach single-lumen power  injectable port catheter remains in good position with the tip at the superior cavoatrial junction. Interval reduction in volume of the left pleural effusion. No evidence of pneumothorax. There is persistent diffuse left basilar airspace opacity which may reflect a combination of residual pleural fluid, atelectasis and/or infiltrate. Post radiation changes in the right lung apex are stable. No acute osseous abnormality. IMPRESSION: No evidence of pneumothorax following left-sided thoracentesis. Persistent left basilar opacity may reflect a combination of residual pleural fluid with atelectasis and/or infiltrate. Electronically Signed   By: Jacqulynn Cadet M.D.    On: 11/02/2018 11:34   Dg Chest 2 View  Result Date: 11/01/2018 CLINICAL DATA:  Pt is currently taking 2 chemo meds. Pt has had emesis every time she eats. Pt denies diarrhea and nausea at this time. Pt has been unable to eat. Pt had infusion on Monday, and did not tell her oncologist about these symptoms. P.*comment was truncated* EXAM: CHEST - 2 VIEW COMPARISON:  Chest radiograph 10/08/2018, CT 09/05/2018 FINDINGS: Port in the anterior chest wall with tip in distal SVC. Chronic consolidation in the RIGHT upper lobe not changed. Chronic LEFT pleural effusion is unchanged. Slight increase in airspace disease in LEFT lung and RIGHT lung. No aggressive osseous lesion. IMPRESSION: 1. Increase in bilateral airspace opacities could represent pulmonary edema or infection. 2. Stable chronic consolidation in the RIGHT upper lobe and LEFT pleural effusion/atelectasis Electronically Signed   By: Suzy Bouchard M.D.   On: 11/01/2018 23:11   Dg Abd 1 View  Result Date: 11/01/2018 CLINICAL DATA:  72 year old female with nausea. History of metastatic lung cancer. EXAM: ABDOMEN - 1 VIEW COMPARISON:  CT of the abdomen pelvis dated 09/06/2018 FINDINGS: There is no bowel dilatation or evidence of obstruction. No free air or radiopaque calculi. There is paucity of small bowel air. There is loss of tissue plane and delineation of organs, likely related to ascites and carcinomatosis seen on the CT. There is a 2.5 cm calcified uterine fibroid. Osteopenia with degenerative changes of the spine. No acute osseous pathology. IMPRESSION: No findings of bowel obstruction. Electronically Signed   By: Anner Crete M.D.   On: 11/01/2018 23:07   US Abdomen Limited  Result Date: 11/04/2018 CLINICAL DATA:  History of metastatic lung malignancy. Evaluate volume of abdominal ascites. EXAM: LIMITED ABDOMEN ULTRASOUND FOR ASCITES TECHNIQUE: Limited ultrasound survey for ascites was performed in all four abdominal quadrants. COMPARISON:   Abdominal ultrasound FINDINGS: There is a moderate amount of ascites noted in all 4 quadrants of the abdomen. Portions of the liver and bowel are seen floating within it. IMPRESSION: Moderate volume ascites in all 4 quadrants. Electronically Signed   By: David  Martinique M.D.   On: 11/04/2018 14:57   US Paracentesis  Result Date: 11/05/2018 INDICATION: Patient with history of metastatic lung cancer, ascites. Request made for diagnostic and therapeutic paracentesis. EXAM: ULTRASOUND GUIDED DIAGNOSTIC AND THERAPEUTIC PARACENTESIS MEDICATIONS: None COMPLICATIONS: None immediate. PROCEDURE: Informed written consent was obtained from the patient after a discussion of the risks, benefits and alternatives to treatment. A timeout was performed prior to the initiation of the procedure. Initial ultrasound scanning demonstrates a moderate amount of ascites within the left lower abdominal quadrant. The left lower abdomen was prepped and draped in the usual sterile fashion. 1% lidocaine was used for local anesthesia. Following this, a 19 gauge, 7-cm, Yueh catheter was introduced. An ultrasound image was saved for documentation purposes. The paracentesis was performed. The catheter was removed and a dressing was applied.  The patient tolerated the procedure well without immediate post procedural complication. FINDINGS: A total of approximately 3.3 liters of slightly hazy, yellow fluid was removed. Samples were sent to the laboratory as requested by the clinical team. IMPRESSION: Successful ultrasound-guided diagnostic and therapeutic paracentesis yielding 3.3 liters of peritoneal fluid. Read by: Rowe Robert, PA-C Electronically Signed   By: Markus Daft M.D.   On: 11/05/2018 10:33   Dg Shoulder Left  Result Date: 11/01/2018 CLINICAL DATA:  72 year old female with left shoulder pain EXAM: LEFT SHOULDER - 2+ VIEW COMPARISON:  Chest x-ray 10/08/2018 FINDINGS: Acute fracture of the distal clavicle, nondisplaced. Glenohumeral  joint is congruent. Degenerative changes at the acromioclavicular joint. Left-sided pleural effusion with reticular changes of the lungs. Port catheter is new from the prior chest x-ray IMPRESSION: Acute fracture distal left clavicle. Left-sided pleural effusion, which was present on prior chest x-ray. Electronically Signed   By: Corrie Mckusick D.O.   On: 11/01/2018 15:32   Ir Imaging Guided Port Insertion  Result Date: 10/18/2018 CLINICAL DATA:  Recurrent metastatic adenocarcinoma of the lung and poor IV access with need for porta cath. EXAM: IMPLANTED PORT A CATH PLACEMENT WITH ULTRASOUND AND FLUOROSCOPIC GUIDANCE ANESTHESIA/SEDATION: 2.0 mg IV Versed; 100 mcg IV Fentanyl Total Moderate Sedation Time:  30 minutes The patient's level of consciousness and physiologic status were continuously monitored during the procedure by Radiology nursing. Additional Medications: 1 g IV vancomycin. FLUOROSCOPY TIME:  12 seconds.  1.9 mGy. PROCEDURE: The procedure, risks, benefits, and alternatives were explained to the patient. Questions regarding the procedure were encouraged and answered. The patient understands and consents to the procedure. A time-out was performed prior to initiating the procedure. Ultrasound was utilized to confirm patency of the right internal jugular vein. The right neck and chest were prepped with chlorhexidine in a sterile fashion, and a sterile drape was applied covering the operative field. Maximum barrier sterile technique with sterile gowns and gloves were used for the procedure. Local anesthesia was provided with 1% lidocaine. After creating a small venotomy incision, a 21 gauge needle was advanced into the right internal jugular vein under direct, real-time ultrasound guidance. Ultrasound image documentation was performed. After securing guidewire access, an 8 Fr dilator was placed. A J-wire was kinked to measure appropriate catheter length. A subcutaneous port pocket was then created along  the upper chest wall utilizing sharp and blunt dissection. Portable cautery was utilized. The pocket was irrigated with sterile saline. A single lumen power injectable port was chosen for placement. The 8 Fr catheter was tunneled from the port pocket site to the venotomy incision. The port was placed in the pocket. External catheter was trimmed to appropriate length based on guidewire measurement. At the venotomy, an 8 Fr peel-away sheath was placed over a guidewire. The catheter was then placed through the sheath and the sheath removed. Final catheter positioning was confirmed and documented with a fluoroscopic spot image. The port was accessed with a needle and aspirated and flushed with heparinized saline. The access needle was removed. The venotomy and port pocket incisions were closed with subcutaneous 3-0 Monocryl and subcuticular 4-0 Vicryl. Dermabond was applied to both incisions. COMPLICATIONS: COMPLICATIONS None FINDINGS: After catheter placement, the tip lies at the cavo-atrial junction. The catheter aspirates normally and is ready for immediate use. IMPRESSION: Placement of single lumen port a cath via right internal jugular vein. The catheter tip lies at the cavo-atrial junction. A power injectable port a cath was placed and is ready for immediate  use. Electronically Signed   By: Aletta Edouard M.D.   On: 10/18/2018 15:04   US Thoracentesis Asp Pleural Space W/img Guide  Result Date: 11/02/2018 Lynnea Ferrier     11/02/2018 11:45 AM PROCEDURE SUMMARY: Successful image-guided left thoracentesis. Yielded 600 milliliters of bloody fluid. Patient tolerated procedure well. No immediate complications. Specimen was not sent for labs. Post procedure CXR shows no pneumothorax. Please see imaging section of Epic for full dictation. Joaquim Nam PA-C 11/02/2018 10:44 AM      Subjective: Patient interviewed and examined with spouse at bedside.  She states that she feels okay.  No pain or  dyspnea reported.  Spouse feels that she is better today than the last couple of days.  Denies any other complaints.  Discharge Exam:  Vitals:   11/07/18 0437 11/07/18 0439 11/07/18 1915 11/07/18 2135  BP: 131/61  (!) 143/79 (!) 144/56  Pulse: 99  (!) 116 (!) 114  Resp: (!) 8 (!) _0 Temp:   98.1 F (36.7 C) (!) 97.3 F (36.3 C)  TempSrc:   Oral Oral  SpO2: 100%  100% 100%  Weight:      Height:        General exam: Elderly female, small built, chronically ill looking lying comfortably supine in bed without distress.  Respiratory system; diminished breath sounds in the bases but otherwise clear to auscultation.  No increased work of breathing.  Has right Port-A-Cath.   Cardiovascular system: S 1, S 2 RRR.  No JVD.  Trace bilateral ankle edema.  Gastrointestinal system: Abdomen mildly distended but soft and nontender.  Normal bowel sounds heard. Central nervous system:  Alert and oriented today.  No focal neurological deficits. Extremities: trace edema Skin: No rashes, lesions or ulcers    The results of significant diagnostics from this hospitalization (including imaging, microbiology, ancillary and laboratory) are listed below for reference.     Microbiology: Recent Results (from the past 240 hour(s))  Culture, body fluid-bottle     Status: None (Preliminary result)   Collection Time: 11/05/18 10:37 AM  Result Value Ref Range Status   Specimen Description PERITONEAL  Final   Special Requests NONE  Final   Culture   Final    NO GROWTH 4 DAYS Performed at [facility], [address] [place], [place] [address]    Report Status PENDING  Incomplete  Gram stain     Status: None   Collection Time: 11/05/18 10:37 AM  Result Value Ref Range Status   Specimen Description PERITONEAL  Final   Special Requests NONE  Final   Gram Stain   Final    MODERATE WBC PRESENT, PREDOMINANTLY MONONUCLEAR NO ORGANISMS SEEN Performed at [facility], [address]  [place], [place] [address]    Report Status 11/05/2018 FINAL  Final     Labs: CBC: Recent Labs  Lab 11/03/18 0924 11/04/18 0411 11/05/18 0929 11/06/18 0502 11/07/18 0426  WBC 15.1* 12.8* 6.9 5.1 5.7  HGB 9.4* 9.0* 9.0* 8.0* 8.8*  HCT 31.5* 29.9* 30.1* 27.3* 29.9*  MCV 96.9 96.1 96.5 97.2 95.2  PLT 121* 112* 116* 99* 91*   Basic Metabolic Panel: Recent Labs  Lab 11/03/18 0924 11/05/18 1229 11/06/18 0502 11/07/18 0426  NA 141 141 144 142  K 4.8 4.0 4.0 4.4  CL 107 105 108 107  CO2 _1 GLUCOSE 128* 103* 113* 91  BUN 41* 55* 52* 49*  CREATININE  0.94 0.88 0.80 0.77  CALCIUM 7.3* 6.9* 7.2* 7.7*   Liver Function Tests: Recent Labs  Lab 11/06/18 0502  AST 26  ALT 13  ALKPHOS 94  BILITOT 0.4  PROT 4.7*  ALBUMIN 1.9*   BNP (last 3 results) Recent Labs    09/04/18 2150 11/01/18 1507  BNP 74.2 66.3   Urinalysis    Component Value Date/Time   COLORURINE YELLOW 11/01/2018 1900   APPEARANCEUR CLOUDY (A) 11/01/2018 1900   LABSPEC 1.025 11/01/2018 1900   LABSPEC 1.010 10/10/2013 1442   PHURINE 6.5 11/01/2018 1900   GLUCOSEU NEGATIVE 11/01/2018 1900   GLUCOSEU Negative 10/10/2013 1442   HGBUR LARGE (A) 11/01/2018 1900   BILIRUBINUR NEGATIVE 11/01/2018 1900   BILIRUBINUR Negative 10/10/2013 1442   KETONESUR 15 (A) 11/01/2018 1900   PROTEINUR 100 (A) 11/01/2018 1900   UROBILINOGEN 0.2 10/10/2013 1442   NITRITE NEGATIVE 11/01/2018 1900   LEUKOCYTESUR MODERATE (A) 11/01/2018 1900   LEUKOCYTESUR Negative 10/10/2013 1442      Time coordinating discharge: 25 minutes  SIGNED:  Vernell Leep, MD, FACP, [facility]. [facility] Pager [phone]  If 7PM-7AM, please contact night-coverage [email or web address] Password [email or web address] 11/09/2018, 1:25 PM

## 2018-11-09 NOTE — Progress Notes (Signed)
Patient discharging to [facility]. Confirmed bed with facility and faxed d/c summary. Family aware of d/c plan. [facility] will be called for transport.  RN call report: [phone].    Pricilla Holm, MSW, [place] Social Work [phone]

## 2018-11-09 NOTE — Progress Notes (Signed)
[facility] -- Hospice and [facility]) [place] RN Visit  Received request from [place], [place] for family interest in [facility] with request to transfer today. Chart reviewed and received report from [place], bedside RN. Met with patient and patient's niece [name] to confirm interest and explain services. Family agreeable to transfer today. Mendel Ryder, LCSW aware. Registration paper work completed. Dr. Orpah Melter to assume care per family request.   Please fax discharge summary to [phone].  RN please call report to [phone].  Please arrange for transportation for patient to arrive at [facility] as soon as possible.  Thank you, Margaretmary Eddy, RN, [facility] Liaison [phone]  [place] are on AMION.

## 2018-11-09 NOTE — Progress Notes (Signed)
Report called to [place] at [facility].  Awaiting [facility] for transport.

## 2018-11-09 NOTE — Progress Notes (Signed)
Referred patient to [facility], Olivia Mackie, who will f/u with family.   Pricilla Holm, MSW, [place] Social Work [phone]

## 2018-11-12 ENCOUNTER — Inpatient Hospital Stay: Payer: Medicare Other

## 2018-11-12 ENCOUNTER — Inpatient Hospital Stay: Payer: Medicare Other | Admitting: Oncology

## 2018-11-13 LAB — CULTURE, BODY FLUID W GRAM STAIN -BOTTLE: Culture: NO GROWTH

## 2018-11-18 ENCOUNTER — Other Ambulatory Visit: Payer: Medicare Other

## 2018-11-18 ENCOUNTER — Ambulatory Visit: Payer: Medicare Other | Admitting: Gynecology

## 2018-11-25 ENCOUNTER — Ambulatory Visit (HOSPITAL_COMMUNITY): Payer: Medicare Other

## 2018-11-26 ENCOUNTER — Other Ambulatory Visit: Payer: Medicare Other

## 2018-11-26 ENCOUNTER — Ambulatory Visit: Payer: Medicare Other | Admitting: Internal Medicine

## 2018-11-26 ENCOUNTER — Ambulatory Visit: Payer: Medicare Other

## 2018-11-27 DEATH — deceased

## 2018-12-01 ENCOUNTER — Other Ambulatory Visit: Payer: Self-pay | Admitting: Family Medicine

## 2018-12-02 NOTE — Telephone Encounter (Signed)
Requested medication (s) are due for refill today: yes  Requested medication (s) are on the active medication list: no  Last refill:  09/03/18  Future visit scheduled: no  Notes to clinic:  dc'd by Dr Pamella Pert 09/20/18   Requested Prescriptions  Pending Prescriptions Disp Refills   amLODipine (NORVASC) 2.5 MG tablet [Pharmacy Med Name: AMLODIPINE BESYLATE 2.5MG  TABLETS] 90 tablet 0    Sig: TAKE 1 TABLET(2.5 MG) BY MOUTH DAILY     Cardiovascular:  Calcium Channel Blockers Failed - 12/01/2018  6:45 AM      Failed - Last BP in normal range    BP Readings from Last 1 Encounters:  11/07/18 (!) 144/56         Failed - Valid encounter within last 6 months    Recent Outpatient Visits          2 months ago Adenocarcinoma of left lung, stage 4 ([place])   [name], [name], MD   3 months ago Cough   [name], [name], MD   6 months ago Cough   [name], [name], MD   7 months ago Cough   Primary Care at [place], [place]   9 months ago Essential hypertension   [name], [name], MD

## 2018-12-10 ENCOUNTER — Other Ambulatory Visit: Payer: Medicare Other

## 2018-12-10 ENCOUNTER — Ambulatory Visit: Payer: Medicare Other | Admitting: Internal Medicine

## 2018-12-10 ENCOUNTER — Ambulatory Visit: Payer: Medicare Other

## 2018-12-11 ENCOUNTER — Encounter: Payer: Self-pay | Admitting: Internal Medicine

## 2019-03-15 IMAGING — DX DG CHEST 1V
1 series · 1 of 1 positions shown · non-contrast
Comparison: Chest radiograph 10/07/2018

CLINICAL DATA: Status post left thoracentesis

EXAM:
CHEST  1 VIEW

[chest pa]
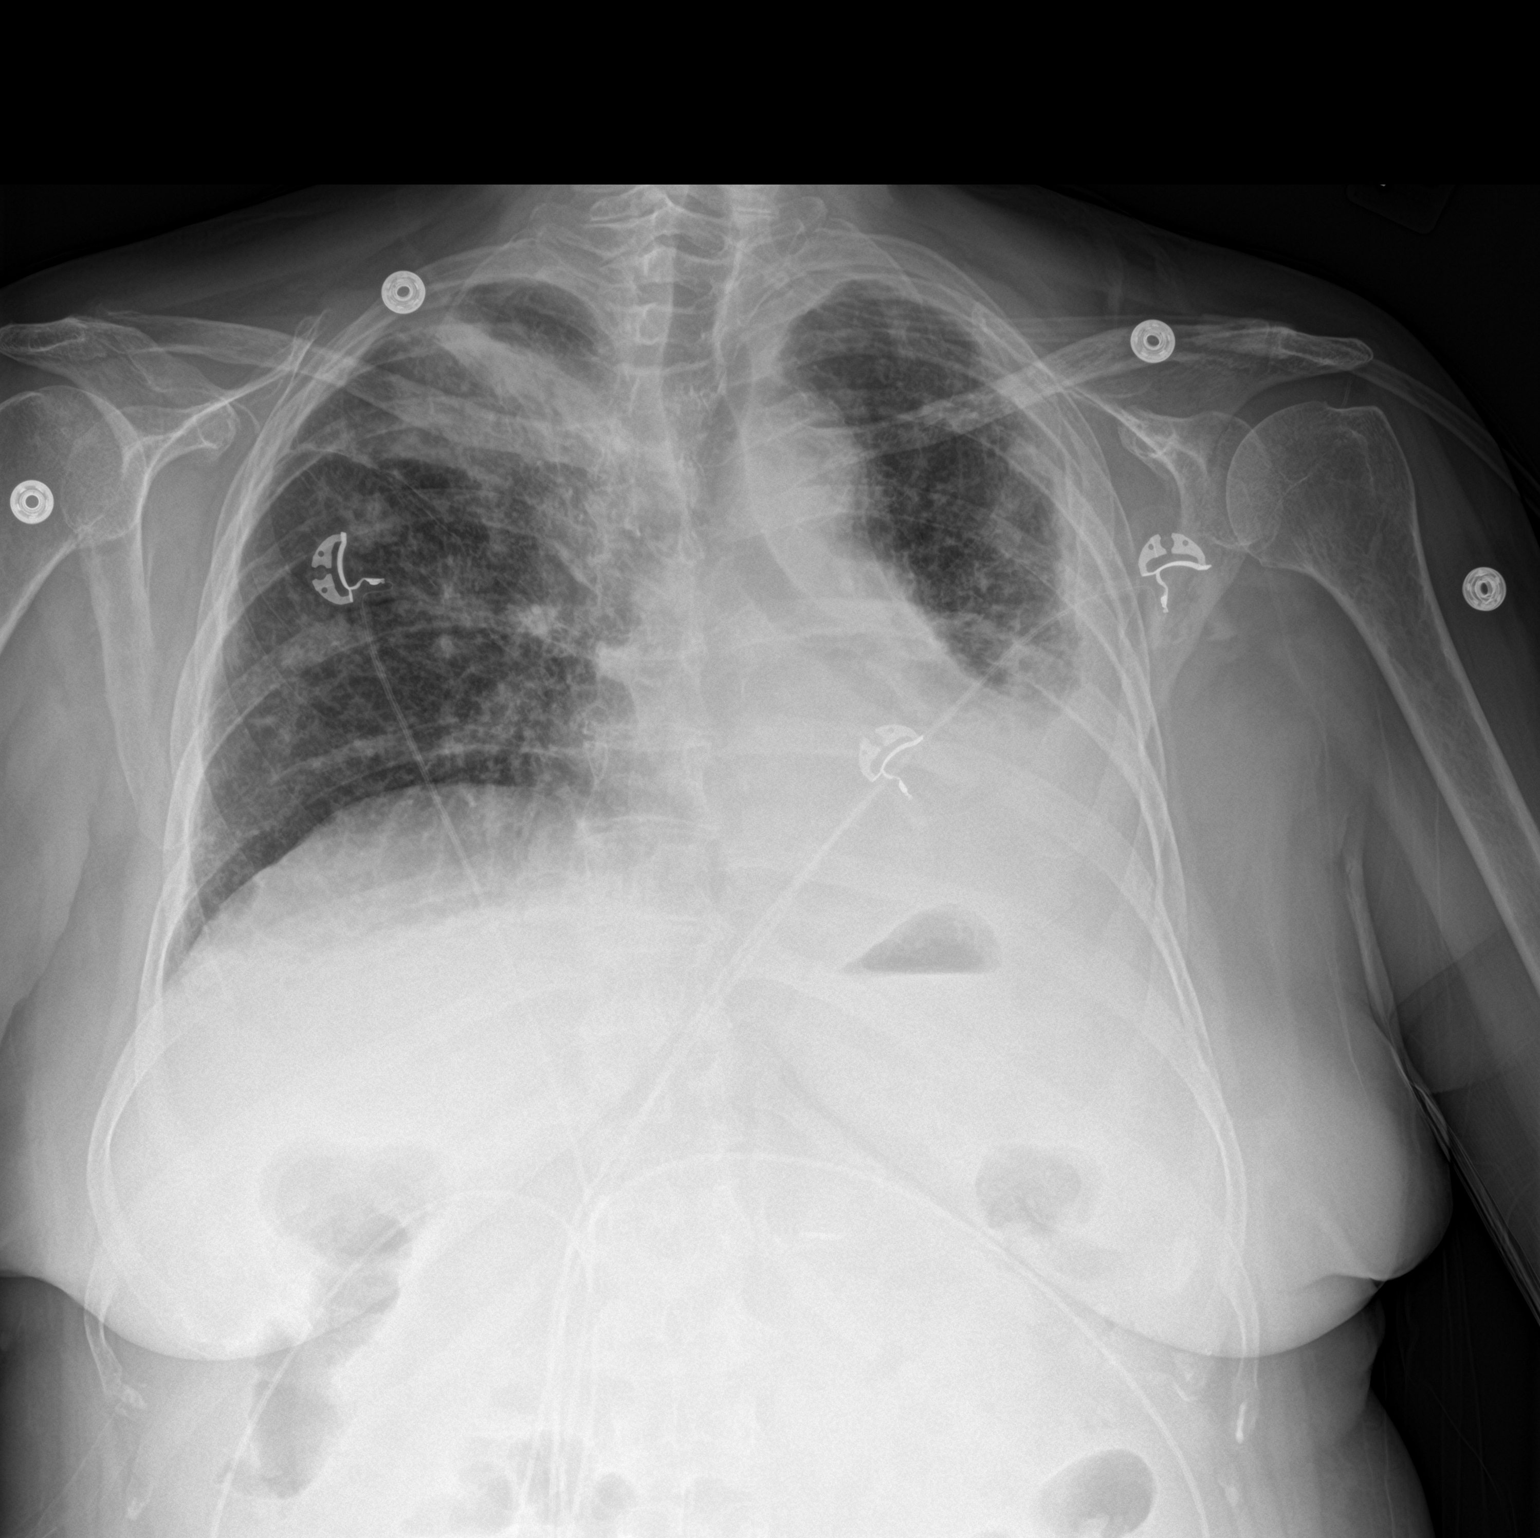

[1 of 1 positions shown; findings below may reference images not displayed]

FINDINGS: Slight decrease in size of left pleural effusion. No pneumothorax.
Streaky opacities at the right lung apex are unchanged.
IMPRESSION: Slight decrease in size of left pleural effusion. No pneumothorax.

## 2019-04-08 IMAGING — CR DG CHEST 2V
2 series · 2 of 2 positions shown · non-contrast
Comparison: Chest radiograph 10/08/2018, CT 09/05/2018

CLINICAL DATA: Pt is currently taking 2 chemo meds. Pt has had
emesis every time she eats. Pt denies diarrhea and nausea at this
time. Pt has been unable to eat. Pt had infusion on [REDACTED], and did
not tell her oncologist about these symptoms. P...*comment was
truncated*

EXAM:
CHEST - 2 VIEW

[w chest lat]
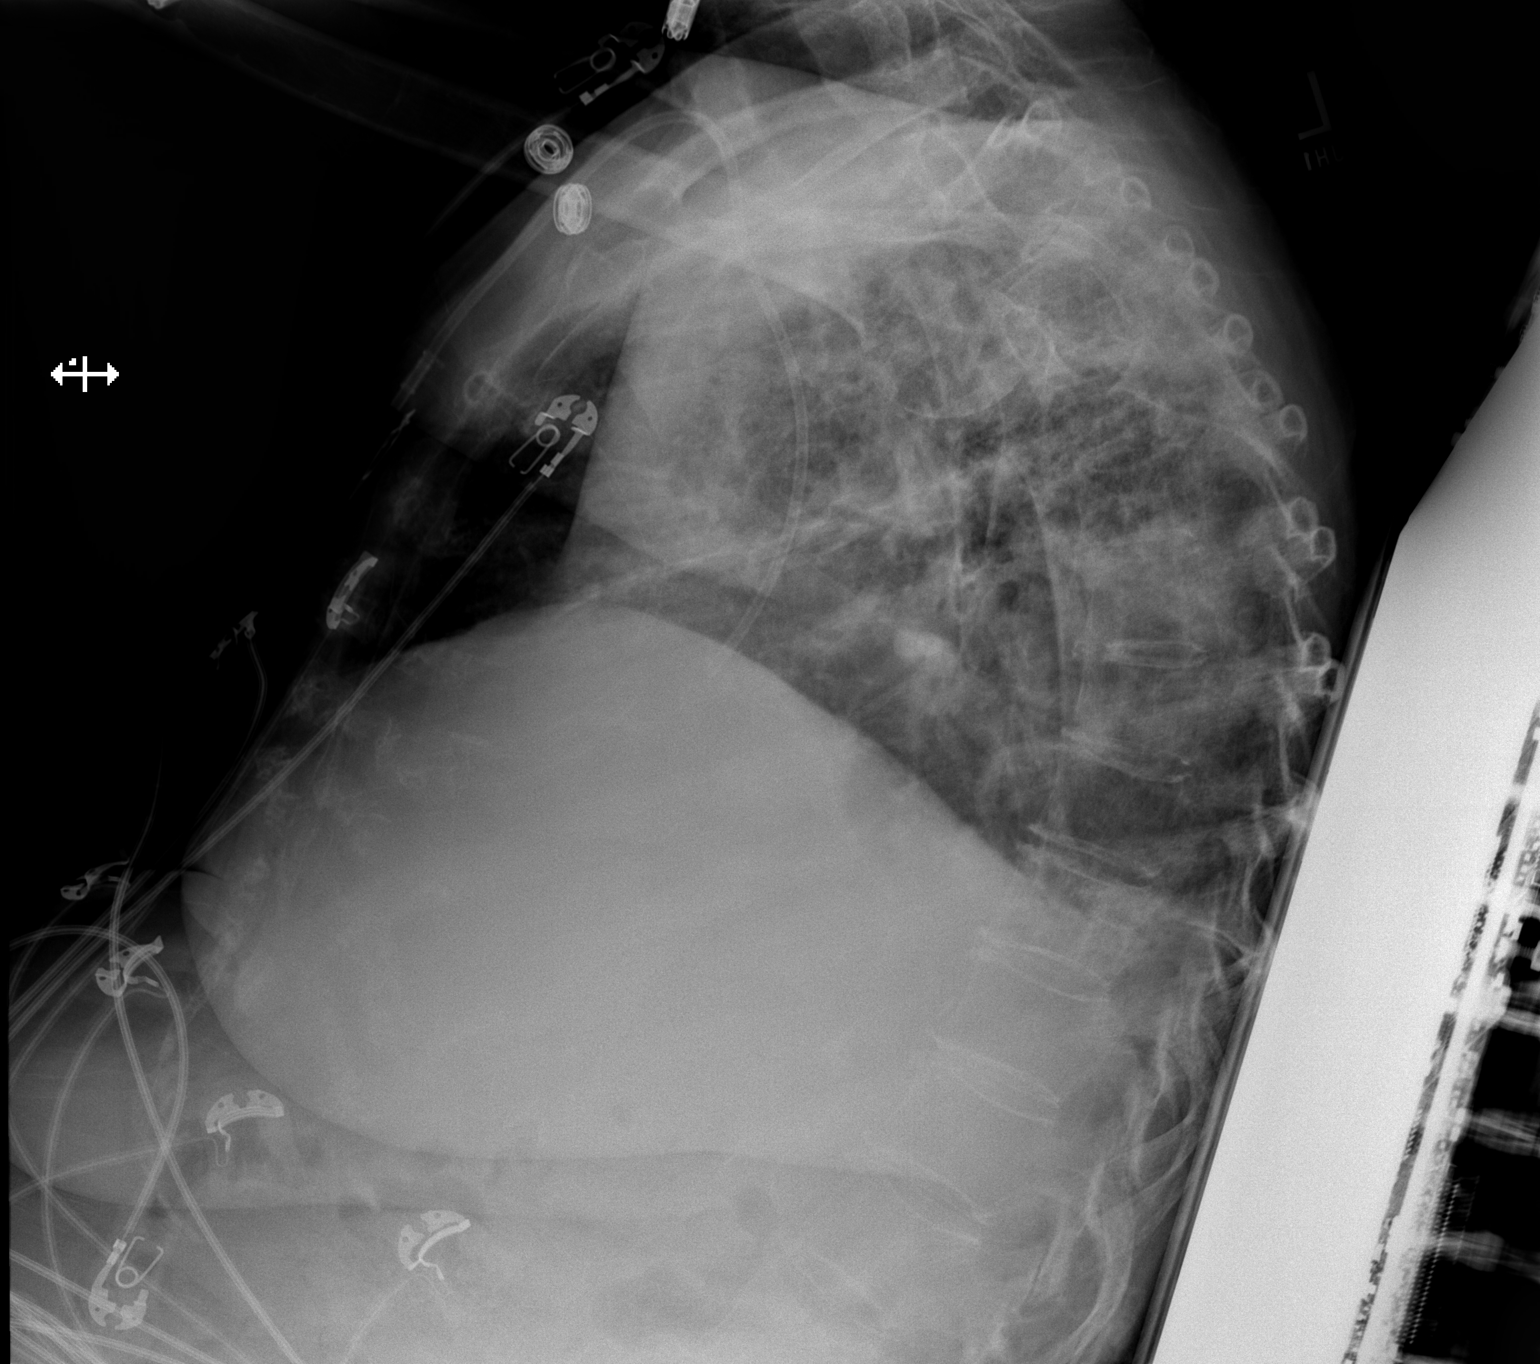

[x chest ap]
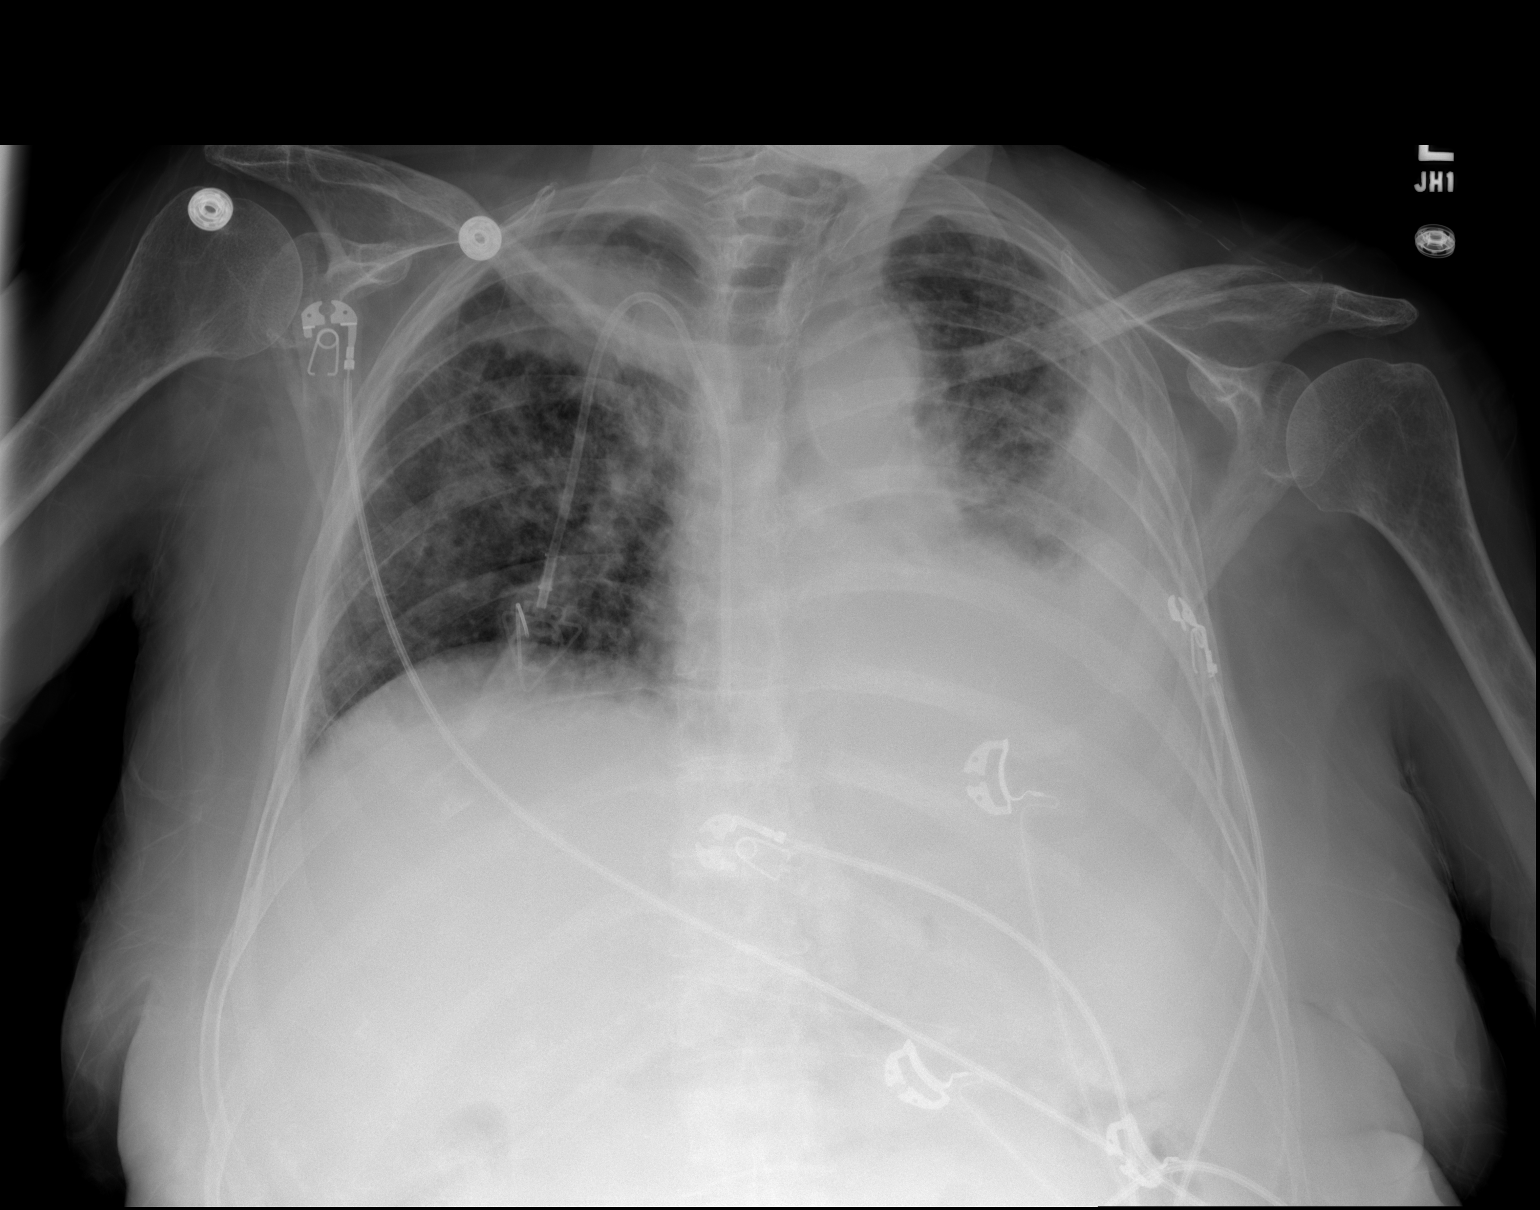

[2 of 2 positions shown; findings below may reference images not displayed]

FINDINGS: Port in the anterior chest wall with tip in distal SVC.

Chronic consolidation in the RIGHT upper lobe not changed. Chronic
LEFT pleural effusion is unchanged. Slight increase in airspace
disease in LEFT lung and RIGHT lung.

No aggressive osseous lesion.
IMPRESSION: 1. Increase in bilateral airspace opacities could represent
pulmonary edema or infection.
2. Stable chronic consolidation in the RIGHT upper lobe and LEFT
pleural effusion/atelectasis

## 2019-04-09 IMAGING — US US THORACENTESIS ASP PLEURAL SPACE W/IMG GUIDE
1 series · 4 of 4 positions shown · non-contrast
Comparison: none

INDICATION: Patient with history of stage IV metastatic lung cancer and
recurrent left pleural effusion who presented to [REDACTED] on [DATE] with
complaints of left shoulder pain, found to have left clavicular
fracture as well as recurrence of left pleural effusion. Request has
been made to IR for therapeutic thoracentesis.

[Series 1: us thoracentesis asp pleural space w/img guide · 0.23mm/px · 4 of 4 slices shown]
[im 1/4]
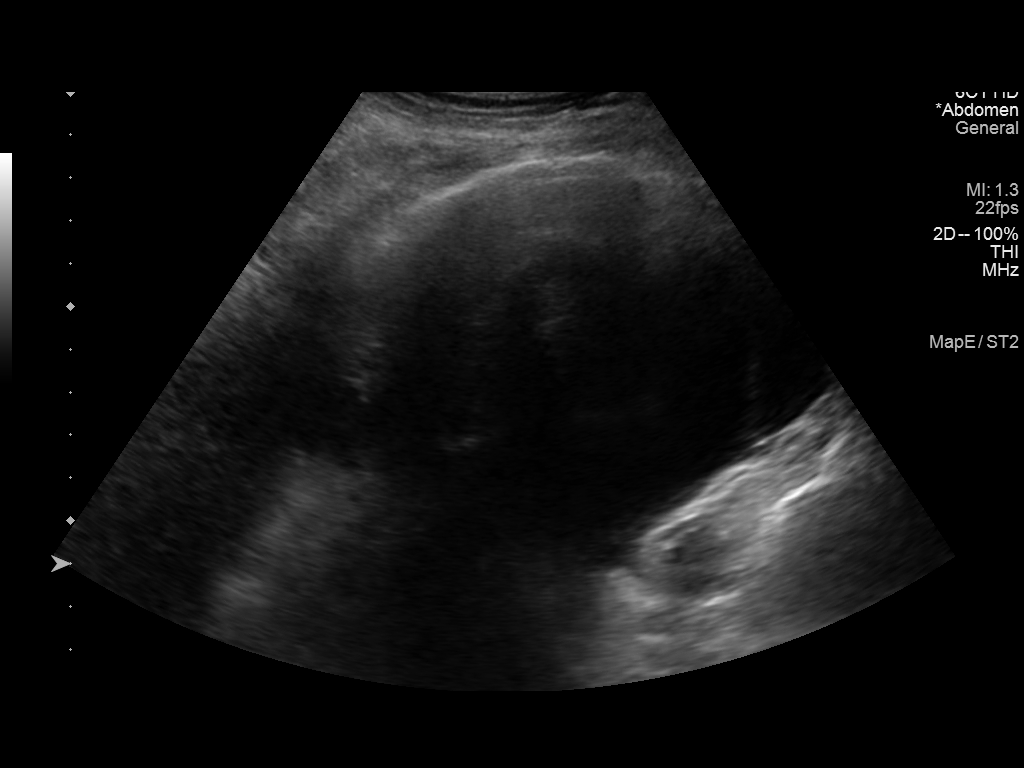
[im 2/4]
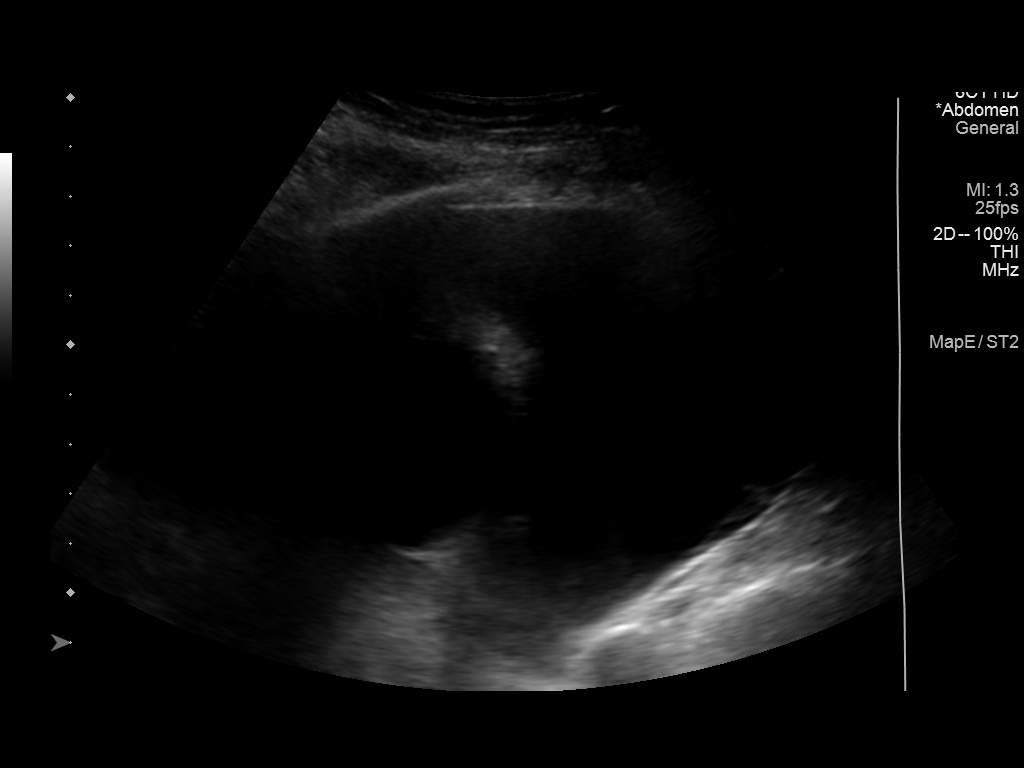
[im 3/4]
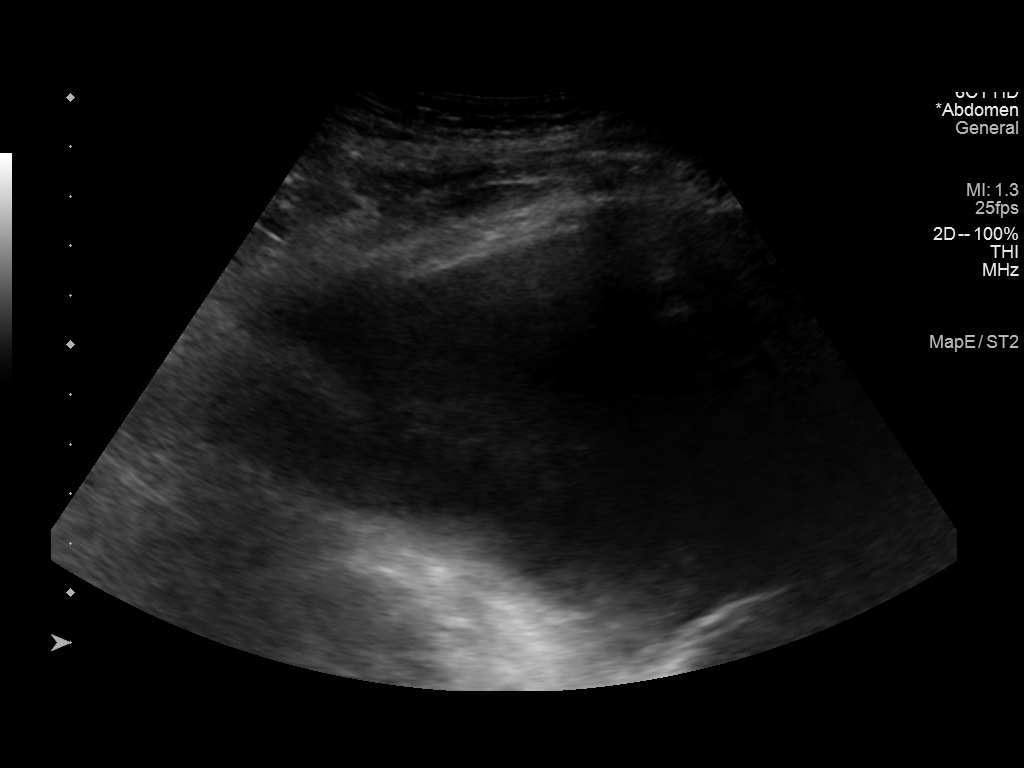
[im 4/4]
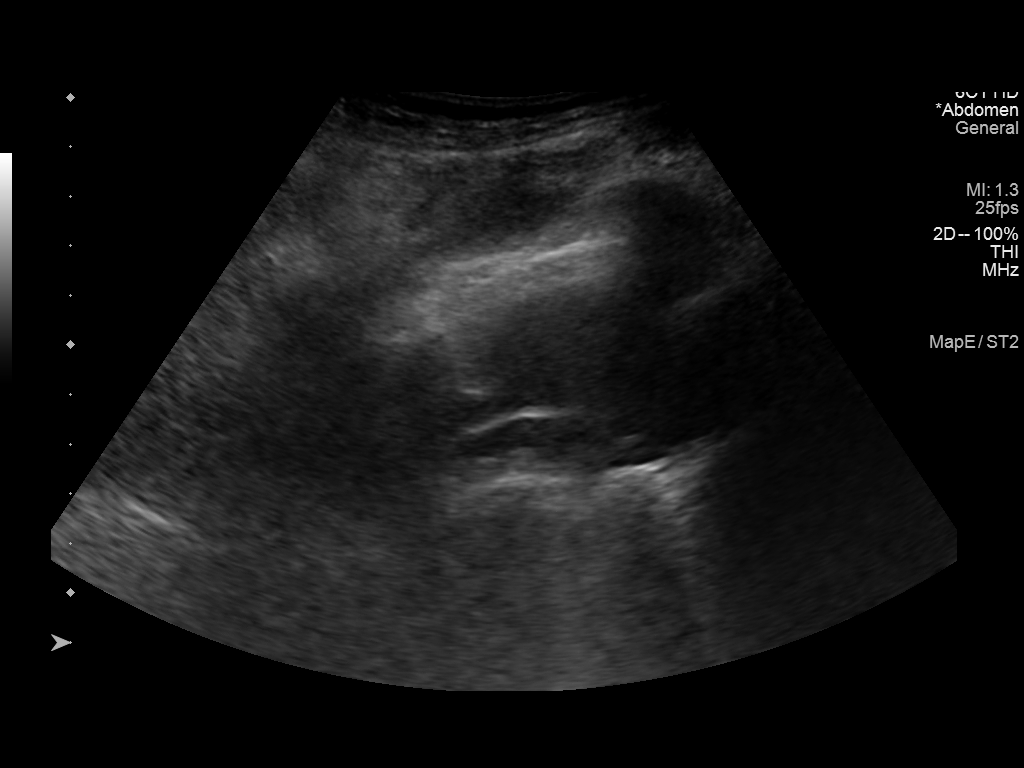

[4 of 4 positions shown; findings below may reference images not displayed]

EXAM:
ULTRASOUND GUIDED LEFT THORACENTESIS

MEDICATIONS:
10 mL 1% lidocaine

COMPLICATIONS:
None immediate.

PROCEDURE:
An ultrasound guided thoracentesis was thoroughly discussed with the
patient and questions answered. The benefits, risks, alternatives
and complications were also discussed. The patient understands and
wishes to proceed with the procedure. Written consent was obtained.

Ultrasound was performed to localize and mark an adequate pocket of
fluid in the left chest. The area was then prepped and draped in the
normal sterile fashion. 1% Lidocaine was used for local anesthesia.
Under ultrasound guidance a 6 Fr Safe-T-Centesis catheter was
introduced. Thoracentesis was performed. The catheter was removed
and a dressing applied.
FINDINGS: A total of approximately 600 mL of bloody fluid was removed.
IMPRESSION: Successful ultrasound guided left thoracentesis yielding 600 mL of
pleural fluid.

Read by Joshjax, [place]

## 2019-04-12 IMAGING — US US PARACENTESIS
1 series · 5 of 5 positions shown · non-contrast
Comparison: none

INDICATION: Patient with history of metastatic lung cancer, ascites. Request
made for diagnostic and therapeutic paracentesis.

[Series 1: us paracentesis · 5 of 5 slices shown]
[im 1/5]
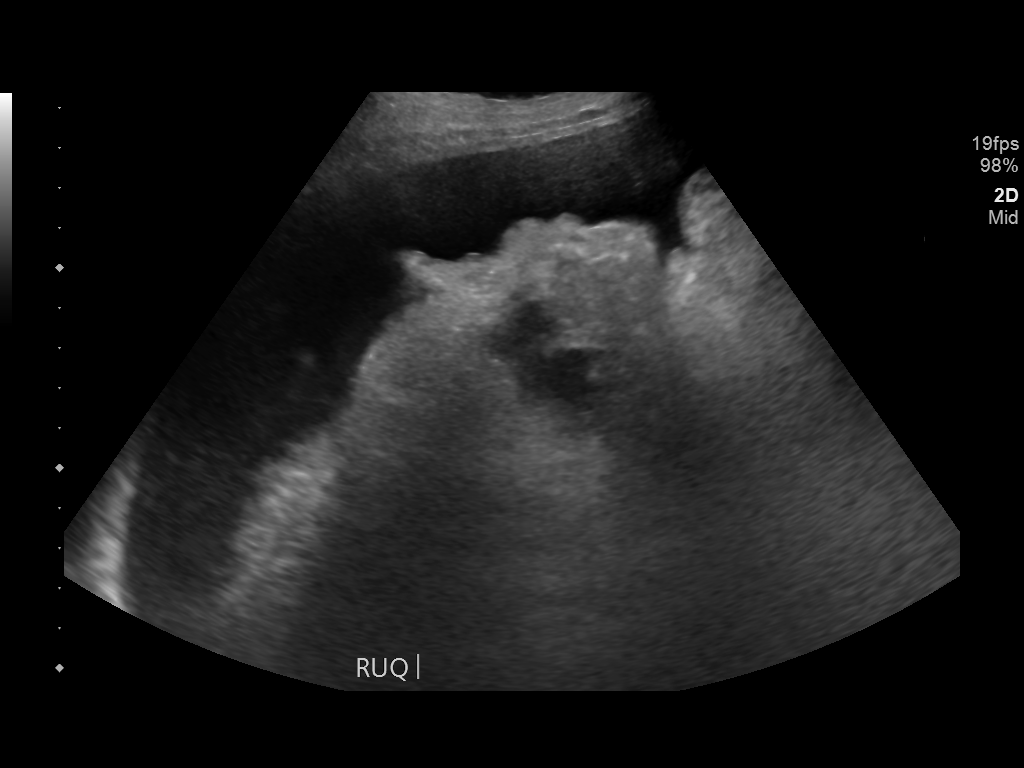
[im 2/5]
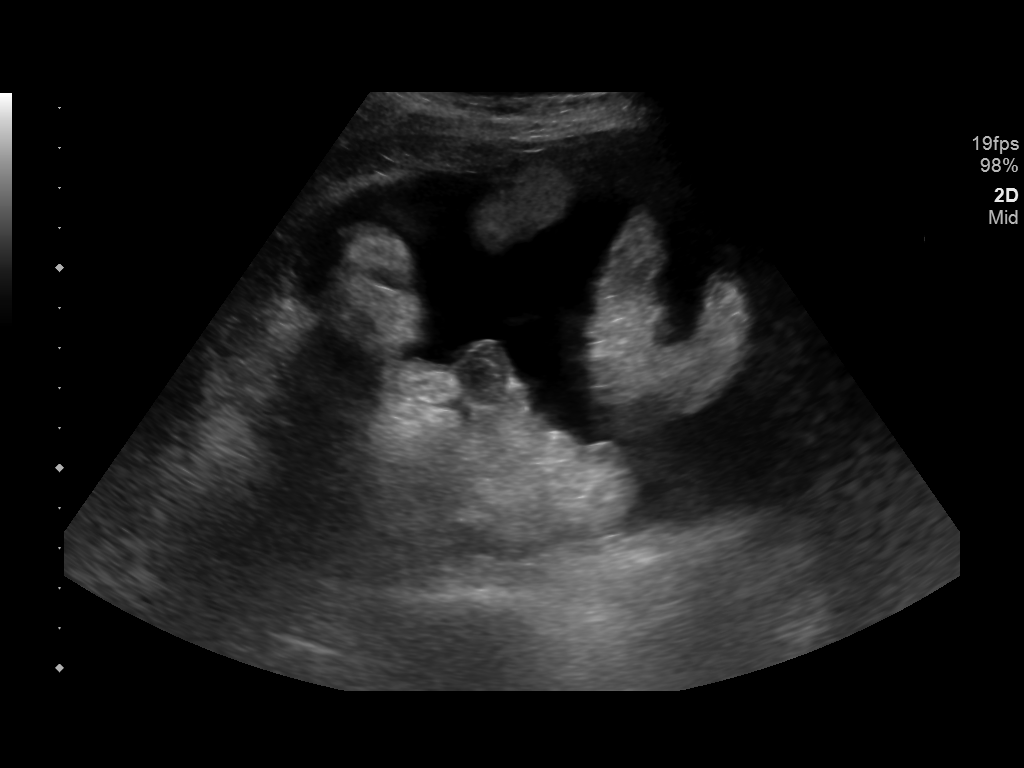
[im 3/5]
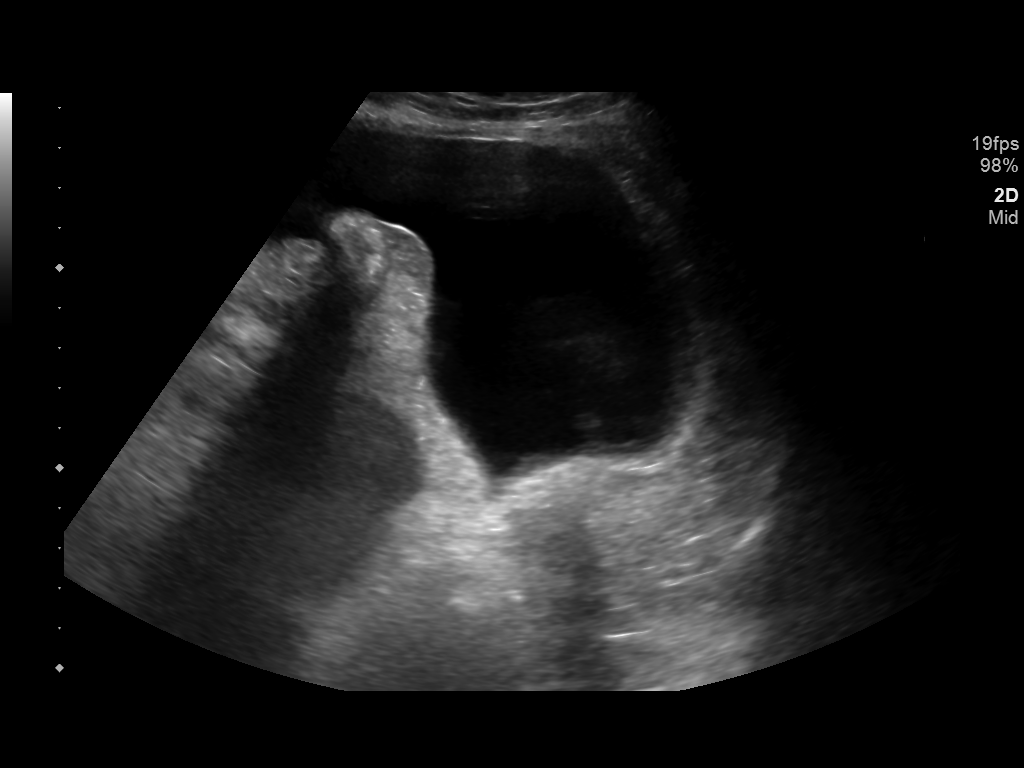
[im 4/5]
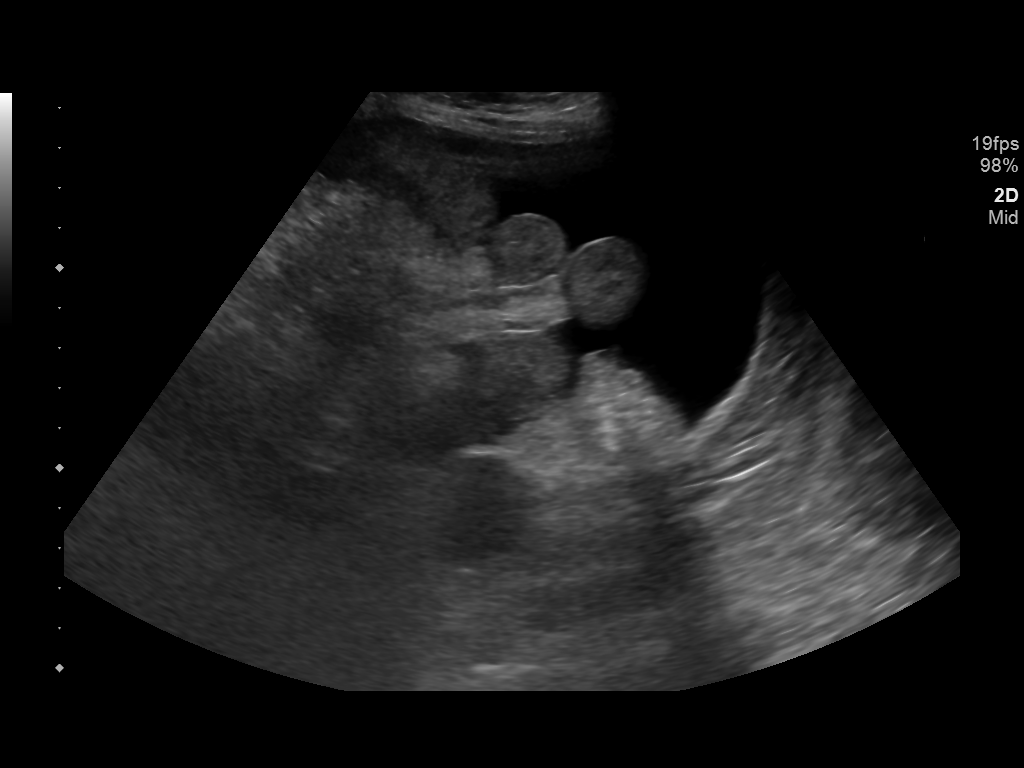
[im 5/5]
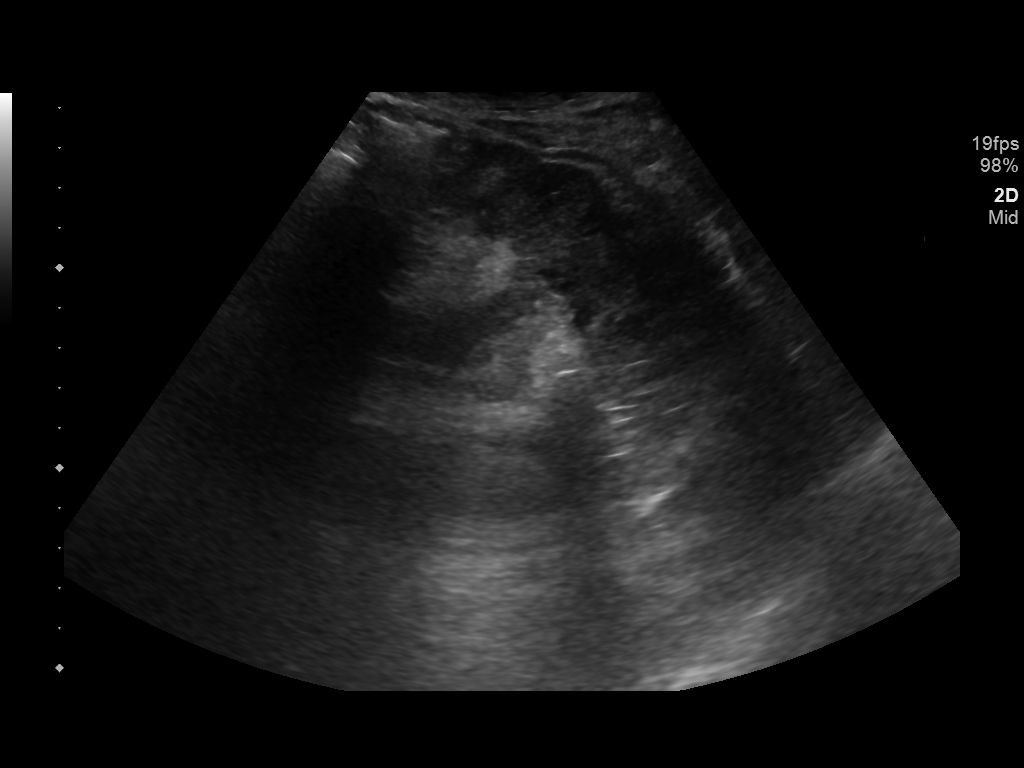

[5 of 5 positions shown; findings below may reference images not displayed]

EXAM:
ULTRASOUND GUIDED DIAGNOSTIC AND THERAPEUTIC PARACENTESIS

MEDICATIONS:
None

COMPLICATIONS:
None immediate.

PROCEDURE:
Informed written consent was obtained from the patient after a
discussion of the risks, benefits and alternatives to treatment. A
timeout was performed prior to the initiation of the procedure.

Initial ultrasound scanning demonstrates a moderate amount of
ascites within the left lower abdominal quadrant. The left lower
abdomen was prepped and draped in the usual sterile fashion. 1%
lidocaine was used for local anesthesia.

Following this, a 19 gauge, 7-cm, Yueh catheter was introduced. An
ultrasound image was saved for documentation purposes. The
paracentesis was performed. The catheter was removed and a dressing
was applied. The patient tolerated the procedure well without
immediate post procedural complication.
FINDINGS: A total of approximately 3.3 liters of slightly hazy, yellow fluid
was removed. Samples were sent to the laboratory as requested by the
clinical team.
IMPRESSION: Successful ultrasound-guided diagnostic and therapeutic paracentesis
yielding 3.3 liters of peritoneal fluid.

## 2020-02-10 NOTE — Telephone Encounter (Signed)
Open chart in personal que, no other action needed
